# Patient Record
Sex: Female | Born: 1985
Health system: Southern US, Community
[De-identification: ages and names within clinical notes are randomized; demographics above are authoritative.]

## PROBLEM LIST (undated history)

## (undated) ENCOUNTER — Emergency Department (HOSPITAL_COMMUNITY): Payer: BC Managed Care – PPO

## (undated) DIAGNOSIS — F329 Major depressive disorder, single episode, unspecified: Secondary | ICD-10-CM

## (undated) DIAGNOSIS — F32A Depression, unspecified: Secondary | ICD-10-CM

## (undated) DIAGNOSIS — O24419 Gestational diabetes mellitus in pregnancy, unspecified control: Secondary | ICD-10-CM

## (undated) DIAGNOSIS — F419 Anxiety disorder, unspecified: Secondary | ICD-10-CM

## (undated) DIAGNOSIS — R3915 Urgency of urination: Secondary | ICD-10-CM

## (undated) DIAGNOSIS — K219 Gastro-esophageal reflux disease without esophagitis: Secondary | ICD-10-CM

## (undated) DIAGNOSIS — Z8619 Personal history of other infectious and parasitic diseases: Secondary | ICD-10-CM

## (undated) DIAGNOSIS — K449 Diaphragmatic hernia without obstruction or gangrene: Secondary | ICD-10-CM

## (undated) DIAGNOSIS — Z8719 Personal history of other diseases of the digestive system: Secondary | ICD-10-CM

## (undated) DIAGNOSIS — Z8659 Personal history of other mental and behavioral disorders: Secondary | ICD-10-CM

## (undated) DIAGNOSIS — N200 Calculus of kidney: Secondary | ICD-10-CM

## (undated) HISTORY — DX: Gastro-esophageal reflux disease without esophagitis: K21.9

## (undated) HISTORY — PX: TONSILLECTOMY: SUR1361

## (undated) HISTORY — PX: ESOPHAGOGASTRODUODENOSCOPY: SHX1529

## (undated) HISTORY — DX: Gestational diabetes mellitus in pregnancy, unspecified control: O24.419

## (undated) HISTORY — DX: Anxiety disorder, unspecified: F41.9

## (undated) SURGERY — CYSTOSCOPY/URETEROSCOPY/HOLMIUM LASER/STENT PLACEMENT
Anesthesia: General | Laterality: Left

---
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
## 2007-10-09 ENCOUNTER — Ambulatory Visit: Payer: Self-pay | Admitting: Gastroenterology

## 2007-10-09 LAB — CONVERTED CEMR LAB
Albumin: 4.2 g/dL (ref 3.5–5.2)
BUN: 8 mg/dL (ref 6–23)
Bilirubin, Direct: 0.2 mg/dL (ref 0.0–0.3)
Calcium: 9.6 mg/dL (ref 8.4–10.5)
Chloride: 104 meq/L (ref 96–112)
Eosinophils Absolute: 0.4 10*3/uL (ref 0.0–0.6)
GFR calc Af Amer: 120 mL/min
GFR calc non Af Amer: 99 mL/min
Glucose, Bld: 82 mg/dL (ref 70–99)
Hemoglobin: 13.9 g/dL (ref 12.0–15.0)
MCHC: 35 g/dL (ref 30.0–36.0)
MCV: 88.5 fL (ref 78.0–100.0)
Monocytes Absolute: 1.1 10*3/uL — ABNORMAL HIGH (ref 0.2–0.7)
Neutrophils Relative %: 77.9 % — ABNORMAL HIGH (ref 43.0–77.0)
Potassium: 4.2 meq/L (ref 3.5–5.1)
RDW: 12.6 % (ref 11.5–14.6)
Sodium: 140 meq/L (ref 135–145)
TSH: 1.64 microintl units/mL (ref 0.35–5.50)

## 2007-10-10 ENCOUNTER — Ambulatory Visit: Payer: Self-pay | Admitting: Gastroenterology

## 2007-10-10 DIAGNOSIS — K210 Gastro-esophageal reflux disease with esophagitis: Secondary | ICD-10-CM

## 2007-10-12 ENCOUNTER — Ambulatory Visit (HOSPITAL_COMMUNITY): Admission: RE | Admit: 2007-10-12 | Discharge: 2007-10-12 | Payer: Self-pay | Admitting: Gastroenterology

## 2007-11-09 ENCOUNTER — Ambulatory Visit: Payer: Self-pay | Admitting: Gastroenterology

## 2008-01-05 ENCOUNTER — Ambulatory Visit: Payer: Self-pay | Admitting: Gastroenterology

## 2008-01-20 ENCOUNTER — Emergency Department (HOSPITAL_COMMUNITY): Admission: EM | Admit: 2008-01-20 | Discharge: 2008-01-21 | Payer: Self-pay | Admitting: Emergency Medicine

## 2008-02-01 ENCOUNTER — Ambulatory Visit: Payer: Self-pay | Admitting: Gastroenterology

## 2008-09-11 ENCOUNTER — Telehealth: Payer: Self-pay | Admitting: Gastroenterology

## 2008-10-01 ENCOUNTER — Ambulatory Visit: Payer: Self-pay | Admitting: Gastroenterology

## 2008-10-01 DIAGNOSIS — R1013 Epigastric pain: Secondary | ICD-10-CM

## 2008-10-01 LAB — CONVERTED CEMR LAB: CRP, High Sensitivity: 1 (ref 0.00–5.00)

## 2008-10-02 ENCOUNTER — Ambulatory Visit (HOSPITAL_COMMUNITY): Admission: RE | Admit: 2008-10-02 | Discharge: 2008-10-02 | Payer: Self-pay | Admitting: Gastroenterology

## 2008-10-02 ENCOUNTER — Ambulatory Visit: Payer: Self-pay | Admitting: Gastroenterology

## 2008-10-02 DIAGNOSIS — N912 Amenorrhea, unspecified: Secondary | ICD-10-CM | POA: Insufficient documentation

## 2008-10-02 LAB — CONVERTED CEMR LAB: UREASE: NEGATIVE

## 2008-12-03 ENCOUNTER — Emergency Department (HOSPITAL_COMMUNITY): Admission: EM | Admit: 2008-12-03 | Discharge: 2008-12-03 | Payer: Self-pay | Admitting: Emergency Medicine

## 2009-01-18 ENCOUNTER — Emergency Department (HOSPITAL_COMMUNITY): Admission: EM | Admit: 2009-01-18 | Discharge: 2009-01-18 | Payer: Self-pay | Admitting: Emergency Medicine

## 2009-01-27 ENCOUNTER — Telehealth: Payer: Self-pay | Admitting: Gastroenterology

## 2009-01-29 ENCOUNTER — Encounter: Payer: Self-pay | Admitting: Gastroenterology

## 2009-02-20 ENCOUNTER — Emergency Department (HOSPITAL_COMMUNITY): Admission: EM | Admit: 2009-02-20 | Discharge: 2009-02-20 | Payer: Self-pay | Admitting: Emergency Medicine

## 2009-03-09 ENCOUNTER — Emergency Department (HOSPITAL_COMMUNITY): Admission: EM | Admit: 2009-03-09 | Discharge: 2009-03-10 | Payer: Self-pay | Admitting: Emergency Medicine

## 2009-03-10 ENCOUNTER — Telehealth: Payer: Self-pay | Admitting: Gastroenterology

## 2009-03-12 ENCOUNTER — Ambulatory Visit: Payer: Self-pay | Admitting: Gastroenterology

## 2009-03-12 DIAGNOSIS — K269 Duodenal ulcer, unspecified as acute or chronic, without hemorrhage or perforation: Secondary | ICD-10-CM | POA: Insufficient documentation

## 2009-03-12 DIAGNOSIS — R110 Nausea: Secondary | ICD-10-CM | POA: Insufficient documentation

## 2009-03-12 LAB — CONVERTED CEMR LAB
Basophils Absolute: 0 10*3/uL (ref 0.0–0.1)
Eosinophils Absolute: 0.2 10*3/uL (ref 0.0–0.7)
Eosinophils Relative: 2.6 % (ref 0.0–5.0)
HCT: 40 % (ref 36.0–46.0)
Lymphs Abs: 3.3 10*3/uL (ref 0.7–4.0)
Monocytes Absolute: 0.8 10*3/uL (ref 0.1–1.0)
Monocytes Relative: 8.6 % (ref 3.0–12.0)
Neutro Abs: 5.3 10*3/uL (ref 1.4–7.7)
RDW: 15.6 % — ABNORMAL HIGH (ref 11.5–14.6)

## 2009-03-13 ENCOUNTER — Telehealth: Payer: Self-pay | Admitting: Physician Assistant

## 2009-03-13 ENCOUNTER — Encounter: Payer: Self-pay | Admitting: Physician Assistant

## 2009-03-25 ENCOUNTER — Ambulatory Visit: Payer: Self-pay | Admitting: Gastroenterology

## 2009-03-26 ENCOUNTER — Ambulatory Visit: Payer: Self-pay | Admitting: Gastroenterology

## 2009-03-27 ENCOUNTER — Telehealth: Payer: Self-pay | Admitting: Gastroenterology

## 2009-04-03 ENCOUNTER — Inpatient Hospital Stay (HOSPITAL_COMMUNITY): Admission: EM | Admit: 2009-04-03 | Discharge: 2009-04-06 | Payer: Self-pay | Admitting: Emergency Medicine

## 2009-04-03 ENCOUNTER — Telehealth: Payer: Self-pay | Admitting: Gastroenterology

## 2009-04-04 ENCOUNTER — Telehealth: Payer: Self-pay | Admitting: Gastroenterology

## 2009-04-29 ENCOUNTER — Telehealth: Payer: Self-pay | Admitting: Gastroenterology

## 2009-04-30 ENCOUNTER — Emergency Department (HOSPITAL_COMMUNITY): Admission: EM | Admit: 2009-04-30 | Discharge: 2009-05-01 | Payer: Self-pay | Admitting: Emergency Medicine

## 2009-05-07 ENCOUNTER — Ambulatory Visit: Payer: Self-pay | Admitting: Internal Medicine

## 2009-05-07 ENCOUNTER — Encounter (INDEPENDENT_AMBULATORY_CARE_PROVIDER_SITE_OTHER): Payer: Self-pay | Admitting: Internal Medicine

## 2009-05-07 DIAGNOSIS — W57XXXA Bitten or stung by nonvenomous insect and other nonvenomous arthropods, initial encounter: Secondary | ICD-10-CM

## 2009-05-07 DIAGNOSIS — T148 Other injury of unspecified body region: Secondary | ICD-10-CM

## 2009-05-07 LAB — CONVERTED CEMR LAB
Basophils Absolute: 0 10*3/uL (ref 0.0–0.1)
Basophils Relative: 0 % (ref 0–1)
Eosinophils Relative: 1 % (ref 0–5)
HCT: 39.3 % (ref 36.0–46.0)
Hemoglobin: 12.9 g/dL (ref 12.0–15.0)
Lymphocytes Relative: 23 % (ref 12–46)
MCHC: 32.8 g/dL (ref 30.0–36.0)
Monocytes Absolute: 0.8 10*3/uL (ref 0.1–1.0)
Monocytes Relative: 8 % (ref 3–12)
RDW: 13.4 % (ref 11.5–15.5)

## 2009-05-08 ENCOUNTER — Encounter (INDEPENDENT_AMBULATORY_CARE_PROVIDER_SITE_OTHER): Payer: Self-pay | Admitting: *Deleted

## 2009-05-08 DIAGNOSIS — D509 Iron deficiency anemia, unspecified: Secondary | ICD-10-CM

## 2009-05-14 ENCOUNTER — Emergency Department (HOSPITAL_COMMUNITY): Admission: EM | Admit: 2009-05-14 | Discharge: 2009-05-14 | Payer: Self-pay | Admitting: Emergency Medicine

## 2009-06-25 ENCOUNTER — Emergency Department (HOSPITAL_COMMUNITY): Admission: EM | Admit: 2009-06-25 | Discharge: 2009-06-26 | Payer: Self-pay | Admitting: Emergency Medicine

## 2009-07-01 ENCOUNTER — Emergency Department (HOSPITAL_COMMUNITY): Admission: EM | Admit: 2009-07-01 | Discharge: 2009-07-01 | Payer: Self-pay | Admitting: Emergency Medicine

## 2009-07-08 ENCOUNTER — Emergency Department (HOSPITAL_COMMUNITY): Admission: EM | Admit: 2009-07-08 | Discharge: 2009-07-08 | Payer: Self-pay | Admitting: Emergency Medicine

## 2009-07-21 ENCOUNTER — Ambulatory Visit: Payer: Self-pay | Admitting: Internal Medicine

## 2009-07-22 ENCOUNTER — Observation Stay (HOSPITAL_COMMUNITY): Admission: EM | Admit: 2009-07-22 | Discharge: 2009-07-26 | Payer: Self-pay | Admitting: Emergency Medicine

## 2009-07-26 ENCOUNTER — Ambulatory Visit: Payer: Self-pay | Admitting: Psychiatry

## 2009-07-30 ENCOUNTER — Telehealth: Payer: Self-pay | Admitting: Gastroenterology

## 2009-08-03 ENCOUNTER — Emergency Department (HOSPITAL_COMMUNITY): Admission: EM | Admit: 2009-08-03 | Discharge: 2009-08-04 | Payer: Self-pay | Admitting: Emergency Medicine

## 2009-08-05 ENCOUNTER — Telehealth: Payer: Self-pay | Admitting: Gastroenterology

## 2009-08-21 ENCOUNTER — Ambulatory Visit: Payer: Self-pay | Admitting: Gastroenterology

## 2009-08-21 DIAGNOSIS — F323 Major depressive disorder, single episode, severe with psychotic features: Secondary | ICD-10-CM

## 2009-08-29 ENCOUNTER — Emergency Department (HOSPITAL_COMMUNITY): Admission: EM | Admit: 2009-08-29 | Discharge: 2009-08-29 | Payer: Self-pay | Admitting: Family Medicine

## 2009-10-25 ENCOUNTER — Emergency Department (HOSPITAL_COMMUNITY): Admission: EM | Admit: 2009-10-25 | Discharge: 2009-10-25 | Payer: Self-pay | Admitting: Emergency Medicine

## 2009-11-04 ENCOUNTER — Ambulatory Visit: Payer: Self-pay | Admitting: Gastroenterology

## 2009-11-04 LAB — CONVERTED CEMR LAB
Basophils Relative: 0 % (ref 0.0–3.0)
CO2: 29 meq/L (ref 19–32)
Calcium: 9.4 mg/dL (ref 8.4–10.5)
Chloride: 104 meq/L (ref 96–112)
Creatinine, Ser: 0.6 mg/dL (ref 0.4–1.2)
Eosinophils Relative: 1 % (ref 0.0–5.0)
HCT: 38.6 % (ref 36.0–46.0)
Hemoglobin: 13.3 g/dL (ref 12.0–15.0)
IgA: 172 mg/dL (ref 68–378)
Lymphs Abs: 2.3 10*3/uL (ref 0.7–4.0)
MCV: 91.7 fL (ref 78.0–100.0)
Monocytes Absolute: 0.5 10*3/uL (ref 0.1–1.0)
Monocytes Relative: 7.9 % (ref 3.0–12.0)
Neutro Abs: 3.2 10*3/uL (ref 1.4–7.7)
Platelets: 283 10*3/uL (ref 150.0–400.0)
Sodium: 140 meq/L (ref 135–145)
WBC: 6.1 10*3/uL (ref 4.5–10.5)

## 2009-11-13 ENCOUNTER — Ambulatory Visit (HOSPITAL_COMMUNITY): Admission: RE | Admit: 2009-11-13 | Discharge: 2009-11-13 | Payer: Self-pay | Admitting: Gastroenterology

## 2009-11-17 ENCOUNTER — Telehealth: Payer: Self-pay | Admitting: Gastroenterology

## 2010-01-04 ENCOUNTER — Emergency Department (HOSPITAL_COMMUNITY): Admission: EM | Admit: 2010-01-04 | Discharge: 2010-01-04 | Payer: Self-pay | Admitting: Family Medicine

## 2010-01-15 ENCOUNTER — Emergency Department (HOSPITAL_COMMUNITY): Admission: EM | Admit: 2010-01-15 | Discharge: 2010-01-15 | Payer: Self-pay | Admitting: Emergency Medicine

## 2010-01-29 ENCOUNTER — Encounter: Payer: Self-pay | Admitting: Internal Medicine

## 2010-01-29 ENCOUNTER — Ambulatory Visit: Payer: Self-pay | Admitting: Internal Medicine

## 2010-01-29 ENCOUNTER — Encounter: Payer: Self-pay | Admitting: Gastroenterology

## 2010-02-09 ENCOUNTER — Emergency Department (HOSPITAL_COMMUNITY): Admission: EM | Admit: 2010-02-09 | Discharge: 2010-02-09 | Payer: Self-pay | Admitting: Emergency Medicine

## 2010-03-19 HISTORY — PX: KNEE ARTHROSCOPY: SUR90

## 2010-03-22 ENCOUNTER — Inpatient Hospital Stay (HOSPITAL_COMMUNITY): Admission: EM | Admit: 2010-03-22 | Discharge: 2010-03-24 | Payer: Self-pay | Admitting: Emergency Medicine

## 2010-03-23 ENCOUNTER — Encounter (INDEPENDENT_AMBULATORY_CARE_PROVIDER_SITE_OTHER): Payer: Self-pay | Admitting: Family Medicine

## 2010-03-23 ENCOUNTER — Ambulatory Visit: Payer: Self-pay | Admitting: Vascular Surgery

## 2010-04-21 ENCOUNTER — Encounter: Admission: RE | Admit: 2010-04-21 | Discharge: 2010-06-15 | Payer: Self-pay | Admitting: Specialist

## 2010-07-16 ENCOUNTER — Emergency Department (HOSPITAL_COMMUNITY): Admission: EM | Admit: 2010-07-16 | Discharge: 2010-07-16 | Payer: Self-pay | Admitting: Family Medicine

## 2010-07-28 ENCOUNTER — Emergency Department (HOSPITAL_COMMUNITY): Admission: EM | Admit: 2010-07-28 | Discharge: 2010-07-28 | Payer: Self-pay | Admitting: Family Medicine

## 2010-09-14 ENCOUNTER — Emergency Department (HOSPITAL_COMMUNITY): Admission: EM | Admit: 2010-09-14 | Discharge: 2010-09-14 | Payer: Self-pay | Admitting: Family Medicine

## 2010-09-14 ENCOUNTER — Telehealth (INDEPENDENT_AMBULATORY_CARE_PROVIDER_SITE_OTHER): Payer: Self-pay | Admitting: *Deleted

## 2010-11-29 ENCOUNTER — Emergency Department (HOSPITAL_COMMUNITY)
Admission: EM | Admit: 2010-11-29 | Discharge: 2010-11-30 | Payer: Self-pay | Source: Home / Self Care | Admitting: Emergency Medicine

## 2010-11-29 NOTE — L&D Delivery Note (Signed)
Delivery Note She progressed to complete and pushed well.  At 4:38 AM a viable and healthy female was delivered via Vaginal, Spontaneous Delivery (Presentation: ;  ).  APGAR:8 ,9 ; weight 9 lb 6.8 oz (4275 g).   Placenta status:spontaneous, intact.    Anesthesia: Epidural  Episiotomy: Small median due to restrictive band holding up delivery Lacerations: small right labial Suture Repair: 3.0 vicryl Est. Blood Loss (mL): 600  Mom to postpartum.  Baby to nursery-stable.  Krystal Humphrey D 09/24/2011, 5:06 AM

## 2010-12-21 ENCOUNTER — Encounter: Payer: Self-pay | Admitting: Gastroenterology

## 2010-12-29 NOTE — Progress Notes (Signed)
Summary: Atterol  Phone Note Call from Patient   Caller: Patient Summary of Call: Call from pt said that she was given an appointment today to follow up on her medication.  Pt said that she was to start back on her Atterol.  Has ADHD.  Was prescribed several years ago by her former doctor in [place].  York Spaniel that she will be starting school again on Thursday and wants to get back on the Atterol.  Pt said that she thinks she has an ear infection.  Said that her ears are stopped up.  Problems hearing.  Had fever 2-3 days ago.  No chills.  Said that she put Sweet Oil in her ears and cotton.  Cotton had a little blood on it.  Coughing up yellow green mucous.  Said taht she feels bad.  Pt said  when asked about documentation about the Atterol.  Pt said  that  she will get documentation today about the Atterol and the dosing .  Wants to be seen today.  Pt was advised that she will need to be seen at some point before the Atterol would be prescribed as well as some type of documentation would be needed.  Pt agreed and will get the information. Initial call taken by: Angelina Ok RN,  September 14, 2010 9:29 AM  Follow-up for Phone Call        Per Inocencio Homes, we have no appts today but you should double check that.  Her ear we can take care of.  As for her ADHD and her Adderall, I personally don't rx these as I don't have enough experience dosing and monitoring the response. But I am not her primary. Whoever sees her may simply arrange referral to psych to Rx this med. Follow-up by: Blanch Media MD,  September 14, 2010 9:46 AM  Additional Follow-up for Phone Call Additional follow up Details #1::        Pt is going to go to Urgent Care for her URI today.  Will call later to be scheduled with an appointment for the Atterol. Additional Follow-up by: Angelina Ok RN,  September 14, 2010 10:00 AM

## 2010-12-29 NOTE — Letter (Signed)
Summary: *Referral Letter [facility])  [facility]  [address]   [place], [place] [address]   Phone: [phone]  Fax: [phone]    01/29/2010  Thank you in advance for taking acre of this patient:  Krystal Humphrey [address] [place], [place]  [address]  Phone: [phone]  Reason for Referral: Chronic abdominal pain in the setting of iron deficiency (subclinical)  Procedures Requested: Small bowel biopsy to rule out celiac disease  Current Medical Problems: 1)  DEPRESSIVE TYPE PSYCHOSIS (ICD-298.0) 2)  ANEMIA, IRON DEFICIENCY (ICD-280.9) 3)  INSECT BITE (ICD-919.4) 4)  NAUSEA (ICD-787.02) 5)  ULCER-DUODENAL (ICD-532.9) 6)  NAUSEA (ICD-787.02) 7)  ABSENCE OF MENSTRUATION (ICD-626.0) 8)  EPIGASTRIC PAIN (ICD-789.06) 9)  REFLUX ESOPHAGITIS (ICD-530.11)   Current Medications: 1)  NEXIUM 40 MG CPDR (ESOMEPRAZOLE MAGNESIUM) 1 by mouth once daily 2)  REGLAN 5 MG TABS (METOCLOPRAMIDE HCL) 1 before meals and at bedtime 3)  SERTRALINE HCL 50 MG TABS (SERTRALINE HCL) once daily at bedtime   Past Medical History: 1)  Current Problems:  2)  GASTRITIS (ICD-535.50) 3)  REFLUX ESOPHAGITIS (ICD-530.11) 4)  HIATAL HERNIA (ICD-553.3)   Family History: 1)  No FH of Colon Cancer:  2)  Family History of Prostate Cancer: father 3)  Melanoma :  Uncle 4)  Family History of Colitis/Crohn's: aunt grandmother 41)  Family History of Diabetes: grandfather uncle  44)  Family History of Heart Disease: grandfather    Social History: 1)  Occupation: [employer]  2)  Daily Caffeine Use 1 per day 3)  Illicit Drug Use - no 4)  Patient does not get regular exercise.  5)  Alcohol Use - yes -rarely   Pertinent Labs:    Please send (mail / fax) all correspondence pertaining to this referral to:  ATTN:    _________________________________   [facility]   [address]   [place], [place] [address]    Fax: [phone]  Thank you  again for agreeing to see our patient; please contact us if you have any further questions or need additional information.  Sincerely,  Lars Mage MD

## 2010-12-29 NOTE — Assessment & Plan Note (Signed)
Summary: ACUTE-STOMACH PAIN/COUGHING UP A LITTLE BLOOD/CFB([facility])   Vital Signs:  Patient profile:   26 year old female Height:      68 inches (172.72 cm) Weight:      163.6 pounds (74.36 kg) BMI:     24.97 Temp:     98.2 degrees F (36.78 degrees C) oral Pulse rate:   73 / minute BP sitting:   115 / 58  (right arm)  Vitals Entered By: Krystal Eaton Duncan Dull) (January 29, 2010 2:40 PM) CC: pt c/o recurrent blood in vomit off and on x 43yr, abd pain (sharp), sometimes feel sick and nauseated after meals Is Patient Diabetic? No Pain Assessment Patient in pain? yes     Location: abdomen Intensity: 8 Type: sharp Onset of pain  Intermittent recurrent x 1 yr Nutritional Status BMI of 25 - 29 = overweight  Have you ever been in a relationship where you felt threatened, hurt or afraid?No   Does patient need assistance? Functional Status Self care Ambulation Normal   Primary Care Provider:  Melida Quitter MD  CC:  pt c/o recurrent blood in vomit off and on x 70yr, abd pain (sharp), and sometimes feel sick and nauseated after meals.  History of Present Illness: Patient is 25 yo women with PMH as decribed in EMR comes in today with abdominal pain. She says that abdmonial pain one of her usual abdominal pains, it starts on it self without any aggravating factors is 8/10 at its worse there are no agg or relieving factors, sharp, stabbing type pain in epigastric region with no radiation. Not associated with eating. She has had multiple evaluations in the past but no diagnosis has been made. She does not take her meds as Reglan causes psychosis and she is sick of nexium. Stopped taking trazodone as she was awake all night after trazodone and says that she is still deprerssed and is not sleeping well all night.  Depression History:      The patient is having a depressed mood most of the day and has a diminished interest in her usual daily activities.         Preventive Screening-Counseling &  Management  Alcohol-Tobacco     Alcohol drinks/day: 1     Alcohol type: beer     Smoking Status: QUIT   2010     Packs/Day: 1/2   Problems Prior to Update: 1)  Depressive Type Psychosis  (ICD-298.0) 2)  Anemia, Iron Deficiency  (ICD-280.9) 3)  Insect Bite  (ICD-919.4) 4)  Nausea  (ICD-787.02) 5)  Ulcer-duodenal  (ICD-532.9) 6)  Nausea  (ICD-787.02) 7)  Absence of Menstruation  (ICD-626.0) 8)  Epigastric Pain  (ICD-789.06) 9)  Reflux Esophagitis  (ICD-530.11)  Medications Prior to Update: 1)  Nexium 40 Mg Cpdr (Esomeprazole Magnesium) .Marland Kitchen.. 1 By Mouth Once Daily 2)  Reglan 5 Mg Tabs (Metoclopramide Hcl) .Marland Kitchen.. 1 Before Meals and At Bedtime  Current Medications (verified): 1)  Nexium 40 Mg Cpdr (Esomeprazole Magnesium) .Marland Kitchen.. 1 By Mouth Once Daily 2)  Reglan 5 Mg Tabs (Metoclopramide Hcl) .Marland Kitchen.. 1 Before Meals and At Bedtime 3)  Sertraline Hcl 50 Mg Tabs (Sertraline Hcl) .... Once Daily At Bedtime  Allergies: 1)  ! Flagyl (Metronidazole)  Past History:  Past Medical History: Last updated: 11/24/2007 Current Problems:  GASTRITIS (ICD-535.50) REFLUX ESOPHAGITIS (ICD-530.11) HIATAL HERNIA (ICD-553.3)  Past Surgical History: Last updated: 10/01/2008 Tonsillectomy  Family History: Last updated: 10/01/2008 No FH of Colon Cancer:  Family History of Prostate Cancer:  father Melanoma :  Uncle Family History of Colitis/Crohn's: aunt grandmother Family History of Diabetes: grandfather uncle  Family History of Heart Disease: grandfather  Social History: Last updated: 11/04/2009 Occupation: [employer]  Daily Caffeine Use 1 per day Illicit Drug Use - no Patient does not get regular exercise.  Alcohol Use - yes -rarely  Risk Factors: Alcohol Use: 1 (01/29/2010) Caffeine Use: 1 (10/01/2008) Exercise: no (10/01/2008)  Risk Factors: Smoking Status: QUIT   2010 (01/29/2010) Packs/Day: 1/2  (01/29/2010)  Family History: Reviewed history from 10/01/2008 and no changes  required. No FH of Colon Cancer:  Family History of Prostate Cancer: father Melanoma :  Uncle Family History of Colitis/Crohn's: aunt grandmother Family History of Diabetes: grandfather uncle  Family History of Heart Disease: grandfather  Social History: Reviewed history from 11/04/2009 and no changes required. Occupation: [employer]  Daily Caffeine Use 1 per day Illicit Drug Use - no Patient does not get regular exercise.  Alcohol Use - yes -rarely  Review of Systems      See HPI  Physical Exam  Additional Exam:  Gen: AOx3, in no acute distress Eyes: PERRL, EOMI ENT:MMM, No erythema noted in posterior pharynx Neck: No JVD, No LAP Chest: CTAB with  good respiratory effort CVS: regular rhythmic rate, NO M/R/G, S1 S2 normal Abdo: soft,ND, BS+x4, Non tender and No hepatosplenomegaly EXT: No odema noted Neuro: Non focal, gait is normal Skin: no rashes noted.    Impression & Recommendations:  Problem # 1:  DEPRESSIVE TYPE PSYCHOSIS (ICD-298.0) Assessment Unchanged Start her on Zoloft 50mg  once daily and have her see a Psychiatrist in 2 weeks for evaluation and management. Orders: Psychiatric Referral (Psych)  Problem # 2:  ANEMIA, IRON DEFICIENCY (ICD-280.9) Assessment: Unchanged Patient has had RDW in 15 range, though she is not anemic at this time but her ferritin was 9 during one of her previous lab results. We will check Ferritin levels, retic count and TIBC to make sure that she does not need iron therapy for replenishment if her iron stores. Patient has iron def anemia in the setting of abdominal pain and we do not see any small bowel biopsy that has been done to rule out Celiac disease. Orders: T-Anemia Panel 1 (2906) T-Iron [phone]) T-Ferritin [id number]) T-Iron Binding Capacity (TIBC) ([id number]) T-Reticulocyte Count, Automated ([id number])  Hgb: 13.3 (11/04/2009)   Hct: 38.6 (11/04/2009)   Platelets: 283.0 (11/04/2009) RBC: 4.21 (11/04/2009)   RDW:  13.9 (11/04/2009)   WBC: 6.1 (11/04/2009) MCV: 91.7 (11/04/2009)   MCHC: 34.4 (11/04/2009) Ferritin: 9 (05/07/2009) TSH: 1.64 (10/09/2007)  Problem # 3:  NAUSEA (ICD-787.02) Assessment: Unchanged Discussed symptom control and asked her to continue taking reglan for nausea as it helps by incresed gastric motility.  Problem # 4:  EPIGASTRIC PAIN (ICD-789.06) Assessment: Unchanged  No particular treatment was discussed at this time as this is something which has been going on for last few years.  Her updated medication list for this problem includes:    Reglan 5 Mg Tabs (Metoclopramide hcl) .Marland Kitchen... 1 before meals and at bedtime  Her updated medication list for this problem includes:    Reglan 5 Mg Tabs (Metoclopramide hcl) .Marland Kitchen... 1 before meals and at bedtime  Complete Medication List: 1)  Nexium 40 Mg Cpdr (Esomeprazole magnesium) .Marland Kitchen.. 1 by mouth once daily 2)  Reglan 5 Mg Tabs (Metoclopramide hcl) .Marland Kitchen.. 1 before meals and at bedtime 3)  Sertraline Hcl 50 Mg Tabs (Sertraline hcl) .... Once daily at bedtime  Patient Instructions:  1)  Please schedule a follow-up appointment in 3 months. 2)  Please schedule a follow-up appointment as needed. 3)  You have an appointment with Psychiatrist. 4)  It is important that you exercise regularly at least 20 minutes 5 times a week. If you develop chest pain, have severe difficulty breathing, or feel very tired , stop exercising immediately and seek medical attention. 5)  You need to lose weight. Consider a lower calorie diet and regular exercise.  6)  You need to have a Pap Smear to prevent cervical cancer. Prescriptions: SERTRALINE HCL 50 MG TABS (SERTRALINE HCL) once daily at bedtime  #30 x 1   Entered by:   Krystal Eaton (AAMA)   Authorized by:   Lars Mage MD   Signed by:   Lars Mage MD on 01/30/2010   Method used:   Electronically to        [id number]* (retail)       [address] [place], [place]  [address]        Ph: [phone]       Fax: [phone]   RxID:   [id number] SERTRALINE HCL 50 MG TABS (SERTRALINE HCL) once daily at bedtime  #30 x 1   Entered and Authorized by:   Lars Mage MD   Signed by:   Lars Mage MD on 01/29/2010   Method used:   Electronically to        [facility] [address] [phone]* (retail)       [address] [place], [place]  [address]       Ph: [phone]       Fax: [phone]   RxID:   [id number]  Process Orders Check Orders Results:     [facility]: Order checked:      -- T-Anemia Panel 1 --  NO TEST CODE (CPT: ) Tests Sent for requisitioning (January 30, 2010 12:40 AM):     01/29/2010: [facility] -- T-Anemia Panel 1 [2906] (signed)     01/29/2010: [facility] -- Augusto Gamble [[id number]] (signed)     01/29/2010: [facility] -- T-Ferritin [[id number]] (signed)     01/29/2010: [facility] -- T-Iron Binding Capacity (TIBC) [[id number]] (signed)     01/29/2010: [facility] -- T-Reticulocyte Count, Automated [[id number]] (signed)  Pt no longer uses the the [facility] on [address], rx will be sent to the [facility] on [address].Krystal Eaton [facility])  January 29, 2010 3:46 PM   Prevention & Chronic Care Immunizations   Influenza vaccine: Not documented   Influenza vaccine deferral: Refused  (01/29/2010)    Tetanus booster: Not documented   Td booster deferral: Deferred  (01/29/2010)    Pneumococcal vaccine: Not documented  Other Screening   Pap smear: Not documented   Smoking status: QUIT   2010  (01/29/2010)      Resource handout printed.

## 2010-12-29 NOTE — Progress Notes (Signed)
Summary: triage   Phone Note Call from Patient Call back at Home Phone [phone]   Caller: Patient Call For: Krystal Humphrey Reason for Call: Talk to Nurse Summary of Call: Patient would like refills on her aciphex but wants to know if she needs to come in and see Dr Jarold Motto since she is having stomach pains again  Initial call taken by: Tawni Levy,  January 27, 2009 2:13 PM  Follow-up for Phone Call        number unavailable  Harlow Mares CMA,  January 27, 2009 2:22 PM number unavailable  Harlow Mares CMA  January 28, 2009 8:39 AM   spoke to mother , rx sent Follow-up by: Harlow Mares CMA,  January 28, 2009 12:53 PM      Prescriptions: ACIPHEX 20 MG  TBEC (RABEPRAZOLE SODIUM) Take 1 each day 30 minutes before meals  #30 x 7   Entered by:   Harlow Mares CMA   Authorized by:   Mardella Layman MD FACG,FAGA   Signed by:   Harlow Mares CMA on 01/28/2009   Method used:   Electronically to        [facility]* (retail)       [address] [place] [address] [place], [place]  [address]       Ph: [phone] or [phone]       Fax: [phone]   RxID:   [id number]

## 2010-12-29 NOTE — Letter (Signed)
Summary: Referral/Outpatient Clinic  Referral/Outpatient Clinic   Imported By: Lester Bronson 02/05/2010 07:52:21  _____________________________________________________________________  External Attachment:    Type:   Image     Comment:   External Document

## 2011-02-08 LAB — DIFFERENTIAL
Basophils Relative: 0 % (ref 0–1)
Eosinophils Relative: 1 % (ref 0–5)
Lymphocytes Relative: 29 % (ref 12–46)
Monocytes Absolute: 0.8 10*3/uL (ref 0.1–1.0)
Monocytes Relative: 7 % (ref 3–12)
Neutro Abs: 7.1 10*3/uL (ref 1.7–7.7)

## 2011-02-08 LAB — URINALYSIS, ROUTINE W REFLEX MICROSCOPIC
Glucose, UA: NEGATIVE mg/dL
Hgb urine dipstick: NEGATIVE
Specific Gravity, Urine: 1.01 (ref 1.005–1.030)
pH: 7 (ref 5.0–8.0)

## 2011-02-08 LAB — URINE MICROSCOPIC-ADD ON

## 2011-02-08 LAB — COMPREHENSIVE METABOLIC PANEL
AST: 19 U/L (ref 0–37)
Albumin: 4.1 g/dL (ref 3.5–5.2)
Alkaline Phosphatase: 40 U/L (ref 39–117)
BUN: 9 mg/dL (ref 6–23)
Chloride: 106 mEq/L (ref 96–112)
GFR calc Af Amer: >60 mL/min (ref >60)
Potassium: 3.6 mEq/L (ref 3.5–5.1)
Total Bilirubin: 0.5 mg/dL (ref 0.3–1.2)
Total Protein: 7 g/dL (ref 6.0–8.3)

## 2011-02-08 LAB — CBC
MCV: 90.1 fL (ref 78.0–100.0)
Platelets: 248 10*3/uL (ref 150–400)
RBC: 4.53 MIL/uL (ref 3.87–5.11)
RDW: 12.6 % (ref 11.5–15.5)
WBC: 11.4 10*3/uL — ABNORMAL HIGH (ref 4.0–10.5)

## 2011-02-08 LAB — POCT PREGNANCY, URINE: Preg Test, Ur: NEGATIVE

## 2011-02-12 LAB — WET PREP, GENITAL
Trich, Wet Prep: NONE SEEN
Yeast Wet Prep HPF POC: NONE SEEN

## 2011-02-12 LAB — GC/CHLAMYDIA PROBE AMP, GENITAL
Chlamydia, DNA Probe: NEGATIVE
GC Probe Amp, Genital: NEGATIVE

## 2011-02-16 LAB — CBC
HCT: 29.8 % — ABNORMAL LOW (ref 36.0–46.0)
HCT: 31.5 % — ABNORMAL LOW (ref 36.0–46.0)
HCT: 32.2 % — ABNORMAL LOW (ref 36.0–46.0)
Hemoglobin: 10.3 g/dL — ABNORMAL LOW (ref 12.0–15.0)
Hemoglobin: 10.9 g/dL — ABNORMAL LOW (ref 12.0–15.0)
Hemoglobin: 11.4 g/dL — ABNORMAL LOW (ref 12.0–15.0)
MCHC: 34.5 g/dL (ref 30.0–36.0)
MCHC: 35.2 g/dL (ref 30.0–36.0)
MCV: 90.3 fL (ref 78.0–100.0)
MCV: 91.5 fL (ref 78.0–100.0)
RBC: 3.23 MIL/uL — ABNORMAL LOW (ref 3.87–5.11)
RBC: 3.56 MIL/uL — ABNORMAL LOW (ref 3.87–5.11)
RDW: 12.7 % (ref 11.5–15.5)
RDW: 13.1 % (ref 11.5–15.5)
WBC: 10 10*3/uL (ref 4.0–10.5)
WBC: 13.9 10*3/uL — ABNORMAL HIGH (ref 4.0–10.5)

## 2011-02-16 LAB — URINALYSIS, ROUTINE W REFLEX MICROSCOPIC
Glucose, UA: NEGATIVE mg/dL
Hgb urine dipstick: NEGATIVE
Ketones, ur: 15 mg/dL — AB
pH: 8.5 — ABNORMAL HIGH (ref 5.0–8.0)

## 2011-02-16 LAB — COMPREHENSIVE METABOLIC PANEL
ALT: 17 U/L (ref 0–35)
AST: 21 U/L (ref 0–37)
Albumin: 2.8 g/dL — ABNORMAL LOW (ref 3.5–5.2)
Albumin: 4.1 g/dL (ref 3.5–5.2)
Alkaline Phosphatase: 44 U/L (ref 39–117)
BUN: 9 mg/dL (ref 6–23)
Calcium: 9 mg/dL (ref 8.4–10.5)
Creatinine, Ser: 0.87 mg/dL (ref 0.4–1.2)
GFR calc Af Amer: >60 mL/min (ref >60)
Glucose, Bld: 72 mg/dL (ref 70–99)
Potassium: 3.3 mEq/L — ABNORMAL LOW (ref 3.5–5.1)
Potassium: 3.8 mEq/L (ref 3.5–5.1)
Sodium: 135 mEq/L (ref 135–145)
Total Bilirubin: 0.6 mg/dL (ref 0.3–1.2)
Total Protein: 5.5 g/dL — ABNORMAL LOW (ref 6.0–8.3)
Total Protein: 7.4 g/dL (ref 6.0–8.3)

## 2011-02-16 LAB — STOOL CULTURE

## 2011-02-16 LAB — DIFFERENTIAL
Basophils Absolute: 0 10*3/uL (ref 0.0–0.1)
Basophils Absolute: 0 10*3/uL (ref 0.0–0.1)
Basophils Relative: 0 % (ref 0–1)
Basophils Relative: 0 % (ref 0–1)
Eosinophils Absolute: 0 10*3/uL (ref 0.0–0.7)
Eosinophils Absolute: 0.2 10*3/uL (ref 0.0–0.7)
Eosinophils Relative: 0 % (ref 0–5)
Eosinophils Relative: 3 % (ref 0–5)
Lymphocytes Relative: 19 % (ref 12–46)
Lymphocytes Relative: 29 % (ref 12–46)
Lymphs Abs: 0.5 10*3/uL — ABNORMAL LOW (ref 0.7–4.0)
Monocytes Absolute: 0.6 10*3/uL (ref 0.1–1.0)
Monocytes Absolute: 0.7 10*3/uL (ref 0.1–1.0)
Monocytes Absolute: 1.1 10*3/uL — ABNORMAL HIGH (ref 0.1–1.0)
Monocytes Relative: 5 % (ref 3–12)
Monocytes Relative: 6 % (ref 3–12)
Neutro Abs: 10.2 10*3/uL — ABNORMAL HIGH (ref 1.7–7.7)
Neutrophils Relative %: 73 % (ref 43–77)

## 2011-02-16 LAB — CULTURE, BLOOD (ROUTINE X 2)

## 2011-02-16 LAB — DRUGS OF ABUSE SCREEN W/O ALC, ROUTINE URINE
Amphetamine Screen, Ur: NEGATIVE
Creatinine,U: 46.5 mg/dL
Marijuana Metabolite: NEGATIVE
Propoxyphene: NEGATIVE

## 2011-02-16 LAB — OPIATE, QUANTITATIVE, URINE
Oxycodone, ur: NEGATIVE NG/ML
Oxymorphone: NEGATIVE NG/ML

## 2011-02-16 LAB — URINE MICROSCOPIC-ADD ON

## 2011-02-16 LAB — CLOSTRIDIUM DIFFICILE EIA: C difficile Toxins A+B, EIA: NEGATIVE

## 2011-02-16 LAB — URINE CULTURE
Colony Count: NO GROWTH
Culture: NO GROWTH

## 2011-02-16 LAB — WET PREP, GENITAL: Trich, Wet Prep: NONE SEEN

## 2011-02-16 LAB — GC/CHLAMYDIA PROBE AMP, GENITAL: Chlamydia, DNA Probe: NEGATIVE

## 2011-02-17 LAB — POCT CARDIAC MARKERS
CKMB, poc: <1 ng/mL — ABNORMAL LOW (ref 1.0–8.0)
Myoglobin, poc: 69.4 ng/mL (ref 12–200)
Troponin i, poc: <0.05 ng/mL (ref 0.00–0.09)

## 2011-02-19 LAB — HIV ANTIBODY (ROUTINE TESTING W REFLEX): HIV: NONREACTIVE

## 2011-02-19 LAB — ABO/RH: RH Type: POSITIVE

## 2011-02-19 LAB — GC/CHLAMYDIA PROBE AMP, GENITAL
Chlamydia: NEGATIVE
Gonorrhea: NEGATIVE

## 2011-02-19 LAB — ANTIBODY SCREEN: Antibody Screen: NEGATIVE

## 2011-03-03 LAB — URINE MICROSCOPIC-ADD ON

## 2011-03-03 LAB — COMPREHENSIVE METABOLIC PANEL
ALT: 14 U/L (ref 0–35)
Alkaline Phosphatase: 34 U/L — ABNORMAL LOW (ref 39–117)
CO2: 23 mEq/L (ref 19–32)
GFR calc non Af Amer: >60 mL/min (ref >60)
Glucose, Bld: 78 mg/dL (ref 70–99)
Potassium: 3.8 mEq/L (ref 3.5–5.1)
Sodium: 128 mEq/L — ABNORMAL LOW (ref 135–145)
Total Bilirubin: 1.2 mg/dL (ref 0.3–1.2)

## 2011-03-03 LAB — DIFFERENTIAL
Basophils Relative: 1 % (ref 0–1)
Eosinophils Absolute: 0.1 10*3/uL (ref 0.0–0.7)
Neutrophils Relative %: 83 % — ABNORMAL HIGH (ref 43–77)

## 2011-03-03 LAB — URINALYSIS, ROUTINE W REFLEX MICROSCOPIC
Bilirubin Urine: NEGATIVE
Glucose, UA: NEGATIVE mg/dL
Leukocytes, UA: NEGATIVE
Nitrite: NEGATIVE
Specific Gravity, Urine: 1.023 (ref 1.005–1.030)
pH: 6.5 (ref 5.0–8.0)

## 2011-03-03 LAB — CBC
Hemoglobin: 13 g/dL (ref 12.0–15.0)
RBC: 4.15 MIL/uL (ref 3.87–5.11)

## 2011-03-03 LAB — ETHANOL: Alcohol, Ethyl (B): <5 mg/dL (ref 0–10)

## 2011-03-03 LAB — LIPASE, BLOOD: Lipase: 15 U/L (ref 11–59)

## 2011-03-03 LAB — RAPID URINE DRUG SCREEN, HOSP PERFORMED
Opiates: POSITIVE — AB
Tetrahydrocannabinol: NOT DETECTED

## 2011-03-05 LAB — CBC
MCV: 87.7 fL (ref 78.0–100.0)
RBC: 4.78 MIL/uL (ref 3.87–5.11)
WBC: 8.3 10*3/uL (ref 4.0–10.5)

## 2011-03-05 LAB — COMPREHENSIVE METABOLIC PANEL
ALT: 23 U/L (ref 0–35)
AST: 30 U/L (ref 0–37)
Alkaline Phosphatase: 46 U/L (ref 39–117)
CO2: 23 mEq/L (ref 19–32)
Chloride: 108 mEq/L (ref 96–112)
Creatinine, Ser: 0.63 mg/dL (ref 0.4–1.2)
GFR calc Af Amer: >60 mL/min (ref >60)
GFR calc non Af Amer: >60 mL/min (ref >60)
Potassium: 3.8 mEq/L (ref 3.5–5.1)
Total Bilirubin: 0.9 mg/dL (ref 0.3–1.2)

## 2011-03-05 LAB — DIFFERENTIAL
Basophils Absolute: 0 10*3/uL (ref 0.0–0.1)
Basophils Relative: 0 % (ref 0–1)
Eosinophils Absolute: 0.1 10*3/uL (ref 0.0–0.7)
Eosinophils Relative: 1 % (ref 0–5)

## 2011-03-05 LAB — URINALYSIS, ROUTINE W REFLEX MICROSCOPIC
Glucose, UA: NEGATIVE mg/dL
Ketones, ur: NEGATIVE mg/dL
Nitrite: NEGATIVE
Protein, ur: 30 mg/dL — AB

## 2011-03-05 LAB — LIPASE, BLOOD: Lipase: 37 U/L (ref 11–59)

## 2011-03-05 LAB — URINE MICROSCOPIC-ADD ON

## 2011-03-06 LAB — MAGNESIUM: Magnesium: 1.9 mg/dL (ref 1.5–2.5)

## 2011-03-06 LAB — CBC
HCT: 37.8 % (ref 36.0–46.0)
HCT: 38.7 % (ref 36.0–46.0)
Hemoglobin: 11.8 g/dL — ABNORMAL LOW (ref 12.0–15.0)
MCHC: 33.3 g/dL (ref 30.0–36.0)
MCHC: 34.1 g/dL (ref 30.0–36.0)
MCV: 87.5 fL (ref 78.0–100.0)
MCV: 87.9 fL (ref 78.0–100.0)
MCV: 87.7 fL (ref 78.0–100.0)
Platelets: 225 10*3/uL (ref 150–400)
Platelets: 283 10*3/uL (ref 150–400)
Platelets: 213 10*3/uL (ref 150–400)
RBC: 3.93 MIL/uL (ref 3.87–5.11)
RBC: 3.94 MIL/uL (ref 3.87–5.11)
RDW: 15 % (ref 11.5–15.5)
RDW: 15.1 % (ref 11.5–15.5)
RDW: 15.4 % (ref 11.5–15.5)
RDW: 14.7 % (ref 11.5–15.5)
WBC: 9.4 10*3/uL (ref 4.0–10.5)
WBC: 6.6 10*3/uL (ref 4.0–10.5)

## 2011-03-06 LAB — COMPREHENSIVE METABOLIC PANEL
ALT: 14 U/L (ref 0–35)
ALT: 16 U/L (ref 0–35)
AST: 19 U/L (ref 0–37)
AST: 20 U/L (ref 0–37)
AST: 15 U/L (ref 0–37)
Albumin: 4.2 g/dL (ref 3.5–5.2)
Albumin: 4.1 g/dL (ref 3.5–5.2)
Alkaline Phosphatase: 42 U/L (ref 39–117)
Alkaline Phosphatase: 43 U/L (ref 39–117)
BUN: 11 mg/dL (ref 6–23)
BUN: 12 mg/dL (ref 6–23)
Calcium: 8.7 mg/dL (ref 8.4–10.5)
Chloride: 104 mEq/L (ref 96–112)
Creatinine, Ser: 0.71 mg/dL (ref 0.4–1.2)
GFR calc Af Amer: >60 mL/min (ref >60)
GFR calc Af Amer: >60 mL/min (ref >60)
GFR calc Af Amer: >60 mL/min (ref >60)
Glucose, Bld: 85 mg/dL (ref 70–99)
Potassium: 3.4 mEq/L — ABNORMAL LOW (ref 3.5–5.1)
Potassium: 4.2 mEq/L (ref 3.5–5.1)
Potassium: 3.9 mEq/L (ref 3.5–5.1)
Sodium: 139 mEq/L (ref 135–145)
Sodium: 141 mEq/L (ref 135–145)
Total Bilirubin: 0.6 mg/dL (ref 0.3–1.2)
Total Protein: 6.6 g/dL (ref 6.0–8.3)
Total Protein: 7.3 g/dL (ref 6.0–8.3)

## 2011-03-06 LAB — DIFFERENTIAL
Basophils Absolute: 0.1 10*3/uL (ref 0.0–0.1)
Basophils Absolute: 0.1 10*3/uL (ref 0.0–0.1)
Basophils Relative: 1 % (ref 0–1)
Basophils Relative: 1 % (ref 0–1)
Eosinophils Absolute: 0.2 10*3/uL (ref 0.0–0.7)
Eosinophils Absolute: 0.2 10*3/uL (ref 0.0–0.7)
Eosinophils Relative: 2 % (ref 0–5)
Eosinophils Relative: 1 % (ref 0–5)
Lymphocytes Relative: 28 % (ref 12–46)
Lymphs Abs: 1.8 10*3/uL (ref 0.7–4.0)
Monocytes Absolute: 0.7 10*3/uL (ref 0.1–1.0)
Monocytes Absolute: 0.4 10*3/uL (ref 0.1–1.0)
Monocytes Relative: 7 % (ref 3–12)
Neutro Abs: 4.2 10*3/uL (ref 1.7–7.7)
Neutro Abs: 4.2 10*3/uL (ref 1.7–7.7)
Neutrophils Relative %: 47 % (ref 43–77)

## 2011-03-06 LAB — URINALYSIS, ROUTINE W REFLEX MICROSCOPIC
Hgb urine dipstick: NEGATIVE
Hgb urine dipstick: NEGATIVE
Nitrite: NEGATIVE
Protein, ur: NEGATIVE mg/dL
Specific Gravity, Urine: 1.02 (ref 1.005–1.030)
Specific Gravity, Urine: 1.021 (ref 1.005–1.030)
Urobilinogen, UA: 0.2 mg/dL (ref 0.0–1.0)
Urobilinogen, UA: 1 mg/dL (ref 0.0–1.0)

## 2011-03-06 LAB — BASIC METABOLIC PANEL
BUN: 5 mg/dL — ABNORMAL LOW (ref 6–23)
CO2: 27 mEq/L (ref 19–32)
CO2: 28 mEq/L (ref 19–32)
Calcium: 8.6 mg/dL (ref 8.4–10.5)
Calcium: 9.1 mg/dL (ref 8.4–10.5)
Chloride: 106 mEq/L (ref 96–112)
Creatinine, Ser: 0.67 mg/dL (ref 0.4–1.2)
Creatinine, Ser: 0.72 mg/dL (ref 0.4–1.2)
GFR calc Af Amer: >60 mL/min (ref >60)
GFR calc Af Amer: >60 mL/min (ref >60)
GFR calc non Af Amer: >60 mL/min (ref >60)
Glucose, Bld: 80 mg/dL (ref 70–99)
Sodium: 136 mEq/L (ref 135–145)

## 2011-03-06 LAB — HEMOGLOBIN AND HEMATOCRIT, BLOOD
HCT: 35.5 % — ABNORMAL LOW (ref 36.0–46.0)
HCT: 40 % (ref 36.0–46.0)
Hemoglobin: 12 g/dL (ref 12.0–15.0)
Hemoglobin: 13.7 g/dL (ref 12.0–15.0)

## 2011-03-06 LAB — IGA: IgA: 157 mg/dL (ref 68–378)

## 2011-03-06 LAB — WET PREP, GENITAL
Trich, Wet Prep: NONE SEEN
Yeast Wet Prep HPF POC: NONE SEEN

## 2011-03-06 LAB — GC/CHLAMYDIA PROBE AMP, GENITAL
Chlamydia, DNA Probe: NEGATIVE
GC Probe Amp, Genital: NEGATIVE

## 2011-03-06 LAB — PREGNANCY, URINE: Preg Test, Ur: NEGATIVE

## 2011-03-06 LAB — SEDIMENTATION RATE: Sed Rate: 2 mm/hr (ref 0–22)

## 2011-03-06 LAB — POCT PREGNANCY, URINE: Preg Test, Ur: NEGATIVE

## 2011-03-07 LAB — COMPREHENSIVE METABOLIC PANEL
ALT: 16 U/L (ref 0–35)
Albumin: 4.5 g/dL (ref 3.5–5.2)
Alkaline Phosphatase: 47 U/L (ref 39–117)
Calcium: 9.8 mg/dL (ref 8.4–10.5)
Potassium: 3.7 mEq/L (ref 3.5–5.1)
Sodium: 136 mEq/L (ref 135–145)
Total Protein: 7.4 g/dL (ref 6.0–8.3)

## 2011-03-07 LAB — WET PREP, GENITAL
Trich, Wet Prep: NONE SEEN
Yeast Wet Prep HPF POC: NONE SEEN

## 2011-03-07 LAB — DIFFERENTIAL
Basophils Relative: 0 % (ref 0–1)
Eosinophils Absolute: 0.1 10*3/uL (ref 0.0–0.7)
Lymphs Abs: 2.5 10*3/uL (ref 0.7–4.0)
Monocytes Absolute: 0.5 10*3/uL (ref 0.1–1.0)
Monocytes Relative: 6 % (ref 3–12)
Neutro Abs: 5.9 10*3/uL (ref 1.7–7.7)

## 2011-03-07 LAB — URINALYSIS, ROUTINE W REFLEX MICROSCOPIC
Bilirubin Urine: NEGATIVE
Glucose, UA: NEGATIVE mg/dL
Hgb urine dipstick: NEGATIVE
Specific Gravity, Urine: 1.017 (ref 1.005–1.030)
Urobilinogen, UA: 0.2 mg/dL (ref 0.0–1.0)

## 2011-03-07 LAB — CBC
Platelets: ADEQUATE 10*3/uL (ref 150–400)
RDW: 13.7 % (ref 11.5–15.5)

## 2011-03-07 LAB — GC/CHLAMYDIA PROBE AMP, GENITAL
Chlamydia, DNA Probe: NEGATIVE
GC Probe Amp, Genital: NEGATIVE

## 2011-03-08 LAB — BASIC METABOLIC PANEL
BUN: 17 mg/dL (ref 6–23)
Calcium: 10 mg/dL (ref 8.4–10.5)
Chloride: 106 mEq/L (ref 96–112)
Creatinine, Ser: 0.71 mg/dL (ref 0.4–1.2)
GFR calc Af Amer: >60 mL/min (ref >60)

## 2011-03-08 LAB — URINALYSIS, ROUTINE W REFLEX MICROSCOPIC
Bilirubin Urine: NEGATIVE
Glucose, UA: NEGATIVE mg/dL
Hgb urine dipstick: NEGATIVE
Ketones, ur: NEGATIVE mg/dL
Protein, ur: NEGATIVE mg/dL
Urobilinogen, UA: 0.2 mg/dL (ref 0.0–1.0)

## 2011-03-08 LAB — DIFFERENTIAL
Basophils Relative: 0 % (ref 0–1)
Eosinophils Absolute: 0 10*3/uL (ref 0.0–0.7)
Lymphs Abs: 2.2 10*3/uL (ref 0.7–4.0)
Monocytes Relative: 8 % (ref 3–12)
Neutro Abs: 8.4 10*3/uL — ABNORMAL HIGH (ref 1.7–7.7)
Neutrophils Relative %: 73 % (ref 43–77)

## 2011-03-08 LAB — RAPID URINE DRUG SCREEN, HOSP PERFORMED
Amphetamines: NOT DETECTED
Benzodiazepines: NOT DETECTED
Cocaine: NOT DETECTED
Tetrahydrocannabinol: NOT DETECTED

## 2011-03-08 LAB — CBC
MCV: 90.7 fL (ref 78.0–100.0)
Platelets: 270 10*3/uL (ref 150–400)
RBC: 4.16 MIL/uL (ref 3.87–5.11)
WBC: 11.5 10*3/uL — ABNORMAL HIGH (ref 4.0–10.5)

## 2011-03-08 LAB — ETHANOL: Alcohol, Ethyl (B): <5 mg/dL (ref 0–10)

## 2011-03-09 LAB — CBC
HCT: 30.6 % — ABNORMAL LOW (ref 36.0–46.0)
HCT: 36.5 % (ref 36.0–46.0)
Hemoglobin: 10.4 g/dL — ABNORMAL LOW (ref 12.0–15.0)
Hemoglobin: 12.5 g/dL (ref 12.0–15.0)
MCHC: 34 g/dL (ref 30.0–36.0)
MCHC: 34.5 g/dL (ref 30.0–36.0)
MCV: 90.1 fL (ref 78.0–100.0)
RBC: 3.46 MIL/uL — ABNORMAL LOW (ref 3.87–5.11)
RBC: 4.04 MIL/uL (ref 3.87–5.11)
RDW: 15.3 % (ref 11.5–15.5)
RDW: 15.6 % — ABNORMAL HIGH (ref 11.5–15.5)
WBC: 9.4 10*3/uL (ref 4.0–10.5)

## 2011-03-09 LAB — POCT I-STAT, CHEM 8
Calcium, Ion: 1.04 mmol/L — ABNORMAL LOW (ref 1.12–1.32)
HCT: 42 % (ref 36.0–46.0)
Sodium: 139 mEq/L (ref 135–145)
TCO2: 23 mmol/L (ref 0–100)

## 2011-03-09 LAB — URINALYSIS, ROUTINE W REFLEX MICROSCOPIC
Glucose, UA: NEGATIVE mg/dL
Protein, ur: NEGATIVE mg/dL
Specific Gravity, Urine: 1.008 (ref 1.005–1.030)
Urobilinogen, UA: 0.2 mg/dL (ref 0.0–1.0)

## 2011-03-09 LAB — DIFFERENTIAL
Basophils Absolute: 0.1 10*3/uL (ref 0.0–0.1)
Lymphocytes Relative: 27 % (ref 12–46)
Lymphs Abs: 2.5 10*3/uL (ref 0.7–4.0)
Monocytes Absolute: 0.6 10*3/uL (ref 0.1–1.0)
Monocytes Relative: 7 % (ref 3–12)
Neutro Abs: 6.1 10*3/uL (ref 1.7–7.7)

## 2011-03-09 LAB — HEPATIC FUNCTION PANEL
Albumin: 4 g/dL (ref 3.5–5.2)
Total Protein: 6.9 g/dL (ref 6.0–8.3)

## 2011-03-09 LAB — COMPREHENSIVE METABOLIC PANEL
BUN: 6 mg/dL (ref 6–23)
CO2: 28 mEq/L (ref 19–32)
Calcium: 8.8 mg/dL (ref 8.4–10.5)
Creatinine, Ser: 0.79 mg/dL (ref 0.4–1.2)
GFR calc Af Amer: >60 mL/min (ref >60)
GFR calc non Af Amer: >60 mL/min (ref >60)
Glucose, Bld: 90 mg/dL (ref 70–99)

## 2011-03-09 LAB — BASIC METABOLIC PANEL
BUN: 10 mg/dL (ref 6–23)
CO2: 27 mEq/L (ref 19–32)
Chloride: 104 mEq/L (ref 96–112)
Potassium: 3.4 mEq/L — ABNORMAL LOW (ref 3.5–5.1)

## 2011-03-09 LAB — CORTISOL: Cortisol, Plasma: 0.5 ug/dL

## 2011-03-09 LAB — LIPASE, BLOOD: Lipase: 25 U/L (ref 11–59)

## 2011-03-09 LAB — MAGNESIUM: Magnesium: 2.1 mg/dL (ref 1.5–2.5)

## 2011-03-09 LAB — POCT PREGNANCY, URINE: Preg Test, Ur: NEGATIVE

## 2011-03-09 LAB — PROTIME-INR: INR: 1.2 (ref 0.00–1.49)

## 2011-03-10 LAB — CBC
HCT: 36.9 % (ref 36.0–46.0)
Hemoglobin: 12.5 g/dL (ref 12.0–15.0)
MCV: 89.7 fL (ref 78.0–100.0)
Platelets: 245 10*3/uL (ref 150–400)
RDW: 16.2 % — ABNORMAL HIGH (ref 11.5–15.5)
WBC: 10.9 10*3/uL — ABNORMAL HIGH (ref 4.0–10.5)

## 2011-03-10 LAB — DIFFERENTIAL
Basophils Absolute: 0.1 10*3/uL (ref 0.0–0.1)
Basophils Relative: 1 % (ref 0–1)
Lymphocytes Relative: 31 % (ref 12–46)
Monocytes Absolute: 0.9 10*3/uL (ref 0.1–1.0)
Neutro Abs: 6.4 10*3/uL (ref 1.7–7.7)
Neutrophils Relative %: 59 % (ref 43–77)

## 2011-03-10 LAB — COMPREHENSIVE METABOLIC PANEL
Albumin: 3.8 g/dL (ref 3.5–5.2)
BUN: 12 mg/dL (ref 6–23)
Chloride: 102 mEq/L (ref 96–112)
Creatinine, Ser: 0.74 mg/dL (ref 0.4–1.2)
Glucose, Bld: 97 mg/dL (ref 70–99)
Total Bilirubin: 0.5 mg/dL (ref 0.3–1.2)

## 2011-03-10 LAB — URINALYSIS, ROUTINE W REFLEX MICROSCOPIC
Nitrite: NEGATIVE
Specific Gravity, Urine: 1.033 — ABNORMAL HIGH (ref 1.005–1.030)
pH: 6 (ref 5.0–8.0)

## 2011-03-10 LAB — LIPASE, BLOOD: Lipase: 22 U/L (ref 11–59)

## 2011-03-10 LAB — URINE MICROSCOPIC-ADD ON

## 2011-03-15 LAB — GC/CHLAMYDIA PROBE AMP, GENITAL
Chlamydia, DNA Probe: NEGATIVE
GC Probe Amp, Genital: NEGATIVE

## 2011-03-15 LAB — POCT PREGNANCY, URINE: Preg Test, Ur: NEGATIVE

## 2011-03-15 LAB — WET PREP, GENITAL

## 2011-04-13 NOTE — Consult Note (Signed)
NAMEALEXIAH, KOROMA NO.:  [id number]   MEDICAL RECORD NO.:  [id number]          PATIENT TYPE:  OBV   LOCATION:  6735                         FACILITY:  [facility]   PHYSICIAN:  Antonietta Breach, M.D.  DATE OF BIRTH:  [birth year]   DATE OF CONSULTATION:  DATE OF DISCHARGE:  07/26/2009                                 CONSULTATION   ADDENDUM:  If needed Mrs. Children could utilize 12-step meetings and  groups as well as alcohol abuse counseling if alcohol  relapse is a  problem.   Regarding the use of Remeron, would be cautious about excessive  sedation/ With the Remeron she may not require the Ambien p.r.n., but  would make the Ambien available one hour after the Remeron p.r.n.  insomnia.      Antonietta Breach, M.D.  Electronically Signed     JW/MEDQ  D:  07/26/2009  T:  07/26/2009  Job:  [id number]

## 2011-04-13 NOTE — H&P (Signed)
NAMEGIANNY, KILLMAN NO.:  [id number]   MEDICAL RECORD NO.:  [id number]          PATIENT TYPE:  INP   LOCATION:  1824                         FACILITY:  [facility]   PHYSICIAN:  Vania Rea, M.D. DATE OF BIRTH:  Apr 18, 1986   DATE OF ADMISSION:  07/22/2009  DATE OF DISCHARGE:                              HISTORY & PHYSICAL   PRIMARY CARE PHYSICIAN:  Unassigned.   CHIEF COMPLAINT:  Vomiting blood for 4 days.   HISTORY OF PRESENT ILLNESS:  This is a 25 year old Caucasian lady with a  past history of peptic ulcer disease, apparently related to alcohol  binge drinking who is no longer abusing alcohol and reports that she is  reportedly chronically taking high-dose Nexium as well as Carafate.  Reports that she has been having persistent vomiting of blood for the  past 4 days.  The patient says she was able to eat a soft diet for the  first 3 days and that food did not aggravate the vomiting nor did it  relieve the vomiting, nor did it aggravate the associated abdominal  pain.  However, the patient says for the past 24 hours she has been  unable to eat or keep anything down.  She says she never looks of her  stools, so she does not know what color it is.  But it tends to be hard,  not soft, not pasty.  She denies use of any NSAIDS or over-the-counter  medication.  The patient has been in the emergency room for many hours  and no vomiting has been witnessed.  However, one of the nurse's report  is bright red blood on tissue paper and the emesis back.  A gastric  lavage was ordered for the patient, but the patient became very agitated  and upset and refused gastric lavage because she says that she has never  had anything like that done before.  Procedure was explained, she still  refused.   Prior to the hospitalist consult, the patient's lab work were completely  normal with no evidence of anemia or dehydration despite date of  vomiting blood and over 24 hours not  eating or drinking.   PAST MEDICAL HISTORY:  1. History of acid reflux.  2. History of hiatal hernia.  3. History of peptic ulcer disease.   MEDICATIONS:  NEXIUM AND CARAFATE.   ALLERGIES:  FLAGYL CAUSES UPSET STOMACH.   SOCIAL HISTORY:  She reports that she has discontinued tobacco use.  Denies alcohol or illicit drug use.   FAMILY HISTORY:  Significant for diabetes as well as Crohn's disease in  her mother.   REVIEW OF SYSTEMS:  On a 10-point review of systems, other than noted  above, unremarkable.   PHYSICAL EXAMINATION:  GENERAL:  Agitated and anxious young Caucasian  lady, reclining in the bed.  VITALS:  Temperature is 98.4, pulse 83, respiration 18, blood pressure  129/72.  She is saturating at 100% on room air.  She describes the  abdominal pain as ranging from a 6-10/10.  HEENT:  Her pupils are round equal and reactive.  Mucous membranes pink  and anicteric.  She is not dehydrated.  No cervical lymphadenopathy or  thyromegaly.  No jugular venous distension.  Oral cavity:  There is no  evidence of bleeding or discoloration of the teeth or gums or pharynx.  CHEST:  Clear to auscultation bilaterally.  CARDIOVASCULAR SYSTEM:  Regular rhythm.  No murmur.  ABDOMEN:  Soft to palpation throughout the exam, but at the end of the  exam, she reports tenderness midway between the epigastrium and the  navel.  EXTREMITIES:  Without edema.  Dorsalis pedis pulses 2+ bilaterally.  She  has no calf tenderness.  She has no bony joint deformity.  SKIN:  Warm and dry.  She has multiple nipple piercings.  CENTRAL NERVOUS SYSTEM: Cranial nerves II-XII are grossly intact.  She  has no focal neurologic deficit.   LABORATORY DATA:  Her white count is 9.4, hemoglobin 12.8, MCV 87.3,  platelets 283.  She has a normal differential on her white count.  Her  sodium is 141, potassium 4.2, chloride 104, CO2 32, glucose 63, BUN 11,  creatinine 0.77.  Liver function tests unremarkable.  Lipase 22.   Urinalysis shows specific gravity of 1.020, pH is 7, negative for  ketones negative for protein negative for nitrites or leukocyte  esterase.   ASSESSMENT:  1. Anxiety.  2. History of 4 days of vomiting and 24 hours of fasting with normal      labs and normal urinalysis.  3. Past history of peptic ulcer disease.   PLAN:  Will admit this lady to telemetry for observation.  Will check  hemoglobin/hematocrit every 4 hours.  Will keep her n.p.o., give her IV  fluids,  treat intravenous Protonix, Zofran and benzodiazepines p.r.n.      Vania Rea, M.D.  Electronically Signed     LC/MEDQ  D:  07/22/2009  T:  07/22/2009  Job:  [id number]   cc:   Gastroenterology Dr. Jarold Motto
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
## 2011-04-13 NOTE — Discharge Summary (Signed)
NAMEAIMIE, Krystal Humphrey            ACCOUNT NO.:  [id number]   MEDICAL RECORD NO.:  [id number]          PATIENT TYPE:  OBV   LOCATION:  6735                         FACILITY:  [facility]   PHYSICIAN:  Isidor Holts, M.D.  DATE OF BIRTH:  [birth year]   DATE OF ADMISSION:  07/21/2009  DATE OF DISCHARGE:  07/26/2009                               DISCHARGE SUMMARY   PRIMARY CARE PHYSICIAN:  Unassigned.   PRIMARY GASTROENTEROLOGIST:  Vania Rea. Jarold Motto, MD, Clementeen Graham, FACP, FAGA   DISCHARGE DIAGNOSES:  1. Acute gastritis.  2. Transient hematemesis, secondary to #1, and retching.  3. Chronic functional abdominal pain.  4. Anxiety/ depression.  5. History of peptic ulcer disease.  6. Gastroesophageal reflux disease/hiatal hernia.   DISCHARGE MEDICATIONS:  1. Nexium 40 mg p.o. b.i.d.  2. Vicodin (5/500) one p.o. p.r.n. q. 6h. A total of 28 pills have      been dispensed.  3. Remeron 7.5 mg p.o. q.h.s.  4. Reglan 5 mg p.o. q.a.c. and h.s.   Note:  Carafate has been discontinued.   PROCEDURES:  None.   CONSULTATIONS:  1. Dr  Iva Boop, MD,  gastroenterologist.  2. Dr. Antonietta Breach, psychiatrist.   ADMISSION HISTORY:  As in H and P notes of July 22, 2009, dictated by  Dr. Vania Rea.  However, in brief, this is a 25 year old female,  with known history of peptic ulcer disease, hiatal hernia, GERD, chronic  abdominal pain, anxiety, depression, presenting with a 4-day history of  persistent vomiting, associated with blood.  No associated diarrhea.  She was admitted for further evaluation, investigation and management.   CLINICAL COURSE.:  1. Acute gastritis.  For details of presentation, refer to admission      history above.  The patient was placed on bowel rest, intravenous      fluid hydration, antiemetics, proton pump inhibitor, with      satisfactory clinical response.  Because of history of hematemesis      as part of her symptomatology, Gastroenterology consultation  was      called, which was kindly provided by Dr. Stan Head, who opined      that the patient has in the past had multiple previous workups and      as a matter of fact, had a normal EGD in April 2010.  He felt that      at the present time, no current indication for repeat EGD existed,      unless hematemesis became persistent or profuse.  Be that as it      may, the patient had only a couple of blood streaks noted in      subsequent vomitus, and remained hemodynamically stable.      Hemoglobin also remained stable.  As a matter of fact as of July 24, 2009 hemoglobin was 11.5.  Over the course of the patient's      hospitalization, symptoms gradually ameliorated and we were able to      discontinue intravenous fluids and advance diet, without any      deleterious consequences.  As of  July 26, 2009 she was completely      asymptomatic.   1. Chronic abdominal pain.  The patient has a history of chronic      abdominal pain and has had extensive evaluations in the past, which      were unrevealing.  Per GI, this is likely secondary to chronic      functional abdominal pain.   1. Depression.  The patient has a history of anxiety/ depression.      During the course of her hospitalization she did appear      significantly depressed.  Consensus, was that psychiatric      evaluation was indicated.  She was seen by Dr. Antonietta Breach on      July 25, 2009, who opined  that she had mood disorder      nonspecified as well as major depression, and has recommended      Remeron in an initial dose of 7.5 mg p.o. q.h.s.  He has also      recommended outpatient psychiatric counseling on discharge.   1. History of GERD/hiatal hernia.  The patient was managed with twice-      daily proton pump inhibitor.   DISPOSITION:  The patient was on 07/26/2009, asymptomatic and keen to be  discharged.  There were no new issues.  She was therefore, discharged  accordingly.   DIET:  No  restrictions.   ACTIVITY:  As tolerated.   FOLLOW-UP INSTRUCTIONS:  The patient is to follow up with her primary  gastroenterologist, Dr. Sheryn Bison, telephone number [phone].  An appointment has been scheduled for September 23 at 8:45 a.m.  In  addition, outpatient psychiatric counseling has been arranged.      Isidor Holts, M.D.  Electronically Signed     CO/MEDQ  D:  07/26/2009  T:  07/26/2009  Job:  [id number]   cc:   Vania Rea. Jarold Motto, MD, Caleen Essex, FAGA

## 2011-04-13 NOTE — H&P (Signed)
NAME:  Krystal, Humphrey NO.:  [id number]   MEDICAL RECORD NO.:  [id number]           PATIENT TYPE:   LOCATION:                                 FACILITY:   PHYSICIAN:  Richarda Overlie, MD       DATE OF BIRTH:  1986/09/02   DATE OF ADMISSION:  DATE OF DISCHARGE:                              HISTORY & PHYSICAL   This is an admission note for [facility].   CHIEF COMPLAINT:  Right upper quadrant pain and hematemesis.   HISTORY OF PRESENT ILLNESS:  This is a 25 year old white woman with past  medical history of chronic abdominal pain and hiatal hernia and peptic  ulcer disease diagnosed in 2008 and 2009 by EGD with recent history of  hematemesis last month and EGD 2 weeks ago, normal.  She had a normal CT  abdomen less than a month ago.  She presents with hematemesis and right upper quadrant pain.  The pain  was on and off for may be a year, but greatly increased today.  She  vomited blood per her report 2 times since this a.m.  Pain is 10/10 in  the right abdomen, stabbing, associated with nausea at home.  No  diarrhea, constipation, fever, or chills.  No menstrual irregularities  or association with cycles.  No dysuria or urinary frequency.   PAST MEDICAL HISTORY:  1. Hematemesis as mentioned above.  EGD by Dr. Sheryn Bison was      nonrevealing 2 weeks ago.  2. History of bacterial vaginosis, January 2010.   HOME MEDICATIONS:  Nexium 40 mg p.o. b.i.d. for the last week and she is  to continue for 1 more week, then switch to daily.   SOCIAL HISTORY:  She lives in [place] with boyfriend and his parents.  She is divorcing now, takes care of boyfriend's mom.  Occasional  drinking alcohol, socially.  However, she did not drink in a month.  No  drugs.   ALLERGIES:  No known drug allergies.   REVIEW OF SYSTEMS:  Positive for nausea, vomiting, and hematemesis.  She  has regular menstrual cycles.  No dysuria.  No headaches.  No dizziness.   PHYSICAL  EXAMINATION:  VITAL SIGNS:  Temperature 97.7, pulse 87, blood  pressure 111/69, respiratory rate 18, and oxygen saturation 100% on room  air.  GENERAL:  She is in no acute distress.  She has no hyperpigmentation on  skin.  CARDIAC:  Regular rate and rhythm.  No murmurs, rubs, or gallops.  PULMONARY:  Clear to auscultation bilaterally.  GI:  Soft and tender to palpation in the right abdomen.  Murphy  positive.  No rebound.  Plus voluntary guarding.  EXTREMITIES:  No edema.  NEURO:  Intact peripheral fields.   LABORATORY DATA:  Lipase 95, urinalysis negative with a specific gravity  of 1.008, UPT negative.  I-STAT; sodium 139, potassium 3.5, chloride  106, bicarb 23, BUN 11, creatinine 0.9, glucose 78, bilirubin 0.5, alk  phos 49, ALT 18, AST 14, protein 6.9, and albumin 4.0.   CT of the pelvis and abdomen was negative on  04/12.   ASSESSMENT AND PLAN:  1. Hematemesis.  Questionable diagnosis especially with normal EGD 2      weeks ago.  She does not use NSAIDs and she takes Nexium double      dose at home.  We will admit her to observe.  We will document all      emesis.  Check hemoccult, check UDS, check CBC, and give Protonix      IV b.i.d.  2. Acute on chronic abdominal pain with questionable etiology.  She      had normal EGD 2 weeks ago.  Normal CT abdomen and pelvis less than      a month ago.  Normal lipase.  Normal LFTs.  Normal urinalysis.      Negative UPT.  She has no risk factors for gastroparesis.  She has      a HIDA scan scheduled for 1 week from today.  We will try to do      this tomorrow.  We will hold opiates and Reglan before the test.      We will give her Percocet and Phenergan, but no NSAIDs because of      GI bleed.  We will check acute abdominal series and we will check      cortisol at 8 a.m.  If does not feel better, she will need GI      consult in the morning.  3. Prophylaxis, Protonix and SCDs.   Case and plan was discussed with Dr. Susie Cassette.       Carlus Pavlov, M.D.  Electronically Signed      Richarda Overlie, MD  Electronically Signed    CG/MEDQ  D:  04/03/2009  T:  04/04/2009  Job:  [id number]

## 2011-04-13 NOTE — Assessment & Plan Note (Signed)
[facility]                         GASTROENTEROLOGY OFFICE NOTE   KALIOPE, QUINONEZ                         MRN:          [id number]  DATE:11/09/2007                            DOB:          [birth year]    Shawnie returns for followup of her endoscopy which was completed on  October 10, 2007.  She had a small hiatal hernia with mild distal  esophagitis.  Gastric biopsy for Helicobacter pylori was negative.  Lab  data was all normal as was upper abdominal ultrasound exam.   Since being on Aciphex 20 mg a day, the patient is almost 100% better,  but continues with some mild reflux symptoms.  I have asked her to  continue Aciphex for another 2 months and then to see me in followup,  and we will consider stopping her medication if she is doing well at  that time.  I again reviewed chronic reflux regimen with her and her  grandmother.     Vania Rea. Jarold Motto, MD, Caleen Essex, FAGA  Electronically Signed    DRP/MedQ  DD: 11/09/2007  DT: 11/09/2007  Job #: [id number]

## 2011-04-13 NOTE — Discharge Summary (Signed)
NAMEELVERTA, DIMICELI NO.:  [id number]   MEDICAL RECORD NO.:  [id number]          PATIENT TYPE:  INP   LOCATION:  5127                         FACILITY:  [facility]   PHYSICIAN:  Lonia Blood, M.D.DATE OF BIRTH:  25-Oct-1986   DATE OF ADMISSION:  04/03/2009  DATE OF DISCHARGE:  04/06/2009                               DISCHARGE SUMMARY   PRIMARY CARE PHYSICIAN:  Unassigned.   DISCHARGE DIAGNOSES:  1. Idiopathic abdominal pain      a.     Possible constipation.      b.     Full evaluation unrevealing.      c.     Followed extensively in the outpatient setting by       gastroenterology - Dr. Vania Rea. Jarold Motto.  2. Reported hematemesis - No evidence of such  during hospital stay -      resolved.  3. Low random serum cortisol      a.     No clinical symptoms to suggest true adrenal insufficiency.      b.     No electrolyte abnormalities to suggest true adrenal       insufficiency.      c.     Outpatient followup recommended.  4. Elevated thyroid stimulating hormone      a.     No symptoms to suggest hypothyroidism.      b.     Free T4 normal.  5. Normocytic anemia - likely secondary to normal menstrual bleeding -      hemoglobin stable.   DISCHARGE MEDICATIONS:  Nexium 40 mg p.o. daily.   FOLLOWUP:  1. The patient is advised to follow up with her primary care physician      of choice, within seven to 10 days.  2. The patient is advised to obtain a GYN physician for followup.  It      is presently the weekend, and I am not able to make an appointment      for her.  It is advised that if she should continue to have      abdominal pain, that she should consider an evaluation of      endometriosis through her GYN physician.   CONSULTATIONS:  None.   PROCEDURE:  1. A CT scan of the abdomen and pelvis on March 10, 2009:  No acute      abdominal process.  Normal appendix.  No acute pelvic process.  2. A CT scan of the head on April 11,2010:  Normal exam.  3.  Nuclear medicine hepatobiliary scan on Apr 04, 2009:  Patency of      cystic and common bile ducts.  Normal gallbladder ejection      fraction.   HOSPITAL COURSE:  Ms. Kuulei Kleier is a 25 year old female with a  longstanding history of greater than one year of diffuse crampy  abdominal pain.  She is followed in the outpatient setting by Dr. Vania Rea. Jarold Motto, gastroenterologist.  She has undergone an EGD as well as  multiple other outpatient evaluations.  The patient  presented the  patient the  emergency room originally with complaints of right upper  quadrant abdominal pain and hematemesis.  During her hospital stay, no  recurrent hematemesis was appreciated.  The patient's hemoglobin  remained stable throughout her hospital stay.  H. pylori antibody was  assessed and was found to be normal.  Urine pregnancy was assessed and  was found to be normal.  Lipase and amylase were both normal.  Sedimentation rate was normal as well.  To rule out nonfunctioning  gallbladder as an etiology, the patient received a nuclear medicine  hepatobiliary scan.  This was unrevealing.  In fact, it was noted to be  entirely normal.  The patient did display some evidence to suggest drug-  seeking behavior.  The patient was counseled extensively as to the  inadvisability of ongoing narcotic use with crampy abdominal pain due to  the possibility of developing severe constipation.  The patient did  admit to constipation.  Bile stimulants were administered and the  patient tolerated this well.  She was moving her bowels before she was  ready for discharge.   Of note, random serum cortisol was obtained during the patient's  hospital stay.  This returned quite low at 0.5.  The exact etiology of  this was not clear.  A repeat study was carried out, and again the  cortisol returned at 0.6.  This was a morning level drawn at  approximately 6:30 a.m.  The patient has no other metabolic findings to  suggest a  true adrenal insufficiency.  There is no evidence on CT scan  of a hypothalamic or pituitary lesion of any significant size, and in  fact a TSH was found to be elevated, rather than depressed.  Serum free  T4 was obtained and found to be well within normal limits.  Therefore  the patient is not felt to have a thyroid issue.   At the present time, given a complete lack of clinical symptoms, further  inpatient evaluation of the patient's low random serum cortisol level is  not felt to be indicated.  In the outpatient setting, however, it would  be advisable to pursue a repeat random serum cortisol and potentially an  ACTH stimulation test, should the patient's follow-up cortisol level  continue to be subnormal.   On Apr 06, 2009, the patient was deemed to be stable for discharge.  Her  vital signs were stable and she was afebrile.  She is tolerating a  regular diet, with no evidence of diarrhea, with a significant decrease  in her abdominal pain, and no hematemesis, nausea or vomiting.  The  patient is counseled as to the absolute necessity of her obtaining a  primary care physician and a gynecologist.  She states that she intends  to arrange this herself.      Lonia Blood, M.D.  Electronically Signed     JTM/MEDQ  D:  04/06/2009  T:  04/06/2009  Job:  [id number]

## 2011-04-13 NOTE — Assessment & Plan Note (Signed)
[facility]                         GASTROENTEROLOGY OFFICE NOTE   Krystal Humphrey, Krystal Humphrey                         MRN:          [id number]  DATE:01/05/2008                            DOB:          1986/07/28    [name] is doing better with her epigastric pain, but continues to have  some aching discomfort in the late afternoon.  She denies typical reflux  symptoms and is following her reflux regimen closely.   As part of her workup, she had a fairly negative endoscopy, except for a  small hiatal hernia, noted on October 10, 2007.  CLO biopsy for H. pylori was negative.  Upper abdominal ultrasound exam  and laboratory data were all normal.   She is a healthy-appearing white female, in no distress, appearing her  stated age.  She weighs 167 pounds and blood pressure is 104/70 and  pulse was 84 and regular.  Abdominal exam was unremarkable, except for  some tenderness in the subxiphoid area.  I could not appreciate any mass  or hepatomegaly.  Bowel sounds were normal and there was no bruit noted.   ASSESSMENT:  I think Krystal Humphrey has acid reflux problems, is having break-  through symptoms in the late afternoon.   RECOMMENDATIONS:  1. Continue reflux regimen.  2. Increase Aciphex to 20 mg twice a day.  3. Office followup in one month's time.  Consider 24-hour pH probe      testing and manometry, depending on her clinical course.     Krystal Rea. Jarold Motto, MD, Caleen Essex, FAGA  Electronically Signed    DRP/MedQ  DD: 01/05/2008  DT: 01/05/2008  Job #: [id number]

## 2011-04-13 NOTE — Assessment & Plan Note (Signed)
[facility]                         GASTROENTEROLOGY OFFICE NOTE   Krystal Humphrey, Krystal Humphrey                         MRN:          [id number]  DATE:10/09/2007                            DOB:          1986-09-15    Krystal Humphrey is an 25 year old white female granddaughter of Krystal Humphrey, one of my long-term patients.  She comes to the office today, on  the advice of her grandmother, for evaluation of epigastric pain.   HISTORY OF PRESENT ILLNESS:  For the last 4 months Krystal Humphrey has had almost  daily postprandial sharp stabbing pain in her subxiphoid area with two  episodes of severe pain lasting several hours in duration.  She denies  true burning substernal pain or acid reflux regurgitation.  The pain has  no radiation and really has no real precipitating or alleviating  elements.  She has not tried H2 blockers or antacids.  She denies  nocturnal wakening, any hepatobiliary complaints, or lower problems,  change in bowel habits, melena or hematochezia.  She follows a regular  diet.  Her appetite has been good and she denies any specific food  intolerances and she has had no weight loss.  She has never had prior GI  evaluations, barium studies or endoscopic exams.  She denies abuse of  salicylates or NSAIDs.  She has had no associated hepatobiliary problem  such as clay-colored stools, dark urine, icterus, fever or chills.   The patient does currently describe a dry, nonproductive, cough with low-  grade fever and general malaise.  She does not smoke cigarettes and has  no history of recurrent pulmonary infections.   PAST MEDICAL HISTORY:  Is otherwise noncontributory.   MEDICATIONS:  None.   ALLERGIES:  None.   FAMILY HISTORY:  1. There are several family members with Crohn's disease.  2. Her grandmother has severe stenosing fistulizing disease.  3. Also remarkable for diabetes mellitus.   SOCIAL HISTORY:  1. She is single and lives with her  mother.  2. She has a high school education and works in a [employer] trader.  3. She denies abuse of cigarettes or alcohol.   REVIEW OF SYSTEMS:  Noncontributory except for painful menses.  Her last  menstrual period was September 23, 2007.  She specifically denies any  other pulmonary or cardiovascular, genitourinary, gynecologic or  neurologic or psychiatric difficulties.   EXAMINATION:  She is an attractive, healthy-appearing, white female in  no distress, appearing her stated age.  She is 5 feet 8 inches tall and  weighs 161 pounds.  Blood pressure was 110/70 and pulse was 82 and  regular.  I could not appreciate stigmata of chronic liver disease or  thyromegaly.  CHEST:  Had some scattered wheezes in her right lung base but otherwise  there were no rales or rhonchi.  She appeared to be in a regular rhythm  without murmurs, gallops or rubs.  There was no hepatosplenomegaly, abdominal masses or tenderness.  Bowel  sounds were normal.  PERIPHERAL EXTREMITIES:  Unremarkable.  MENTAL STATUS:  Clear.  RECTAL EXAM:  Deferred.   ASSESSMENT:  1. Probable acid reflux disease with associated hiatal hernia.  I am      somewhat concerned about the severity of her symptoms at her young      age.  We, of course, need to exclude cholelithiasis.  2. Probable acute bronchitis.  3. Strong family history of inflammatory bowel disease.   RECOMMENDATIONS:  1. Check screening laboratory parameters including CBC, metabolic      profile and sed rate.  2. Outpatient endoscopy and ultrasound.  3. Reflux regimen and movie. I have started Aciphex 20 mg thirty      minutes before the first meal of the day.  4. Z-Pak for her bronchitis, awaiting further workup.     Krystal Humphrey. Jarold Motto, MD, Caleen Essex, FAGA  Electronically Signed    DRP/MedQ  DD: 10/09/2007  DT: 10/09/2007  Job #: [id number]

## 2011-09-01 ENCOUNTER — Encounter (HOSPITAL_COMMUNITY): Payer: Self-pay | Admitting: *Deleted

## 2011-09-01 ENCOUNTER — Inpatient Hospital Stay (HOSPITAL_COMMUNITY)
Admission: AD | Admit: 2011-09-01 | Discharge: 2011-09-02 | Disposition: A | Discharge status: Home / Self Care | Payer: Medicaid Other | Source: Ambulatory Visit | Attending: Obstetrics and Gynecology | Admitting: Obstetrics and Gynecology

## 2011-09-01 DIAGNOSIS — O479 False labor, unspecified: Secondary | ICD-10-CM | POA: Insufficient documentation

## 2011-09-01 NOTE — Progress Notes (Signed)
contractionsx 4 hours, denies bleeding or ROM, reports good fetal movement. G1

## 2011-09-02 MED ORDER — ZOLPIDEM TARTRATE 10 MG PO TABS
10.0000 mg | ORAL_TABLET | Freq: Once | ORAL | Status: AC
  Administered 2011-09-02: 10 mg via ORAL
  Filled 2011-09-02: qty 1

## 2011-09-21 ENCOUNTER — Telehealth (HOSPITAL_COMMUNITY): Payer: Self-pay | Admitting: *Deleted

## 2011-09-21 ENCOUNTER — Encounter (HOSPITAL_COMMUNITY): Payer: Self-pay | Admitting: *Deleted

## 2011-09-21 NOTE — Telephone Encounter (Signed)
Preadmission screen  

## 2011-09-23 ENCOUNTER — Encounter (HOSPITAL_COMMUNITY): Payer: Self-pay

## 2011-09-23 ENCOUNTER — Ambulatory Visit (HOSPITAL_COMMUNITY): Payer: Medicaid Other | Admitting: Anesthesiology

## 2011-09-23 ENCOUNTER — Encounter (HOSPITAL_COMMUNITY): Payer: Self-pay | Admitting: Anesthesiology

## 2011-09-23 ENCOUNTER — Inpatient Hospital Stay (HOSPITAL_COMMUNITY)
Admission: RE | Admit: 2011-09-23 | Discharge: 2011-09-26 | DRG: 775 | Disposition: A | Discharge status: Home / Self Care | Payer: Medicaid Other | Source: Ambulatory Visit | Attending: Obstetrics and Gynecology | Admitting: Obstetrics and Gynecology

## 2011-09-23 HISTORY — DX: Major depressive disorder, single episode, unspecified: F32.9

## 2011-09-23 HISTORY — DX: Depression, unspecified: F32.A

## 2011-09-23 LAB — CBC
HCT: 34.1 % — ABNORMAL LOW (ref 36.0–46.0)
Hemoglobin: 11.6 g/dL — ABNORMAL LOW (ref 12.0–15.0)
MCV: 90 fL (ref 78.0–100.0)
RBC: 3.79 MIL/uL — ABNORMAL LOW (ref 3.87–5.11)
WBC: 15.1 10*3/uL — ABNORMAL HIGH (ref 4.0–10.5)

## 2011-09-23 MED ORDER — OXYTOCIN 20 UNITS IN LACTATED RINGERS INFUSION - SIMPLE: 125.0000 mL/h | Freq: Once | INTRAVENOUS | Status: DC

## 2011-09-23 MED ORDER — ONDANSETRON HCL 4 MG/2ML IJ SOLN
4.0000 mg | Freq: Four times a day (QID) | INTRAMUSCULAR | Status: DC | PRN
  Administered 2011-09-23 – 2011-09-24 (×2): 4 mg via INTRAVENOUS
  Filled 2011-09-23 (×2): qty 2

## 2011-09-23 MED ORDER — OXYTOCIN 20 UNITS IN LACTATED RINGERS INFUSION - SIMPLE
1.0000 m[IU]/min | INTRAVENOUS | Status: DC
  Administered 2011-09-23: 1 m[IU]/min via INTRAVENOUS
  Filled 2011-09-23: qty 1000

## 2011-09-23 MED ORDER — LACTATED RINGERS IV SOLN
500.0000 mL | Freq: Once | INTRAVENOUS | Status: AC
  Administered 2011-09-23: 1000 mL via INTRAVENOUS

## 2011-09-23 MED ORDER — LACTATED RINGERS IV SOLN: 500.0000 mL | INTRAVENOUS | Status: DC | PRN

## 2011-09-23 MED ORDER — OXYTOCIN BOLUS FROM INFUSION
500.0000 mL | Freq: Once | INTRAVENOUS | Status: AC
  Administered 2011-09-24: 500 mL via INTRAVENOUS
  Filled 2011-09-23: qty 1000
  Filled 2011-09-23: qty 500

## 2011-09-23 MED ORDER — PHENYLEPHRINE 40 MCG/ML (10ML) SYRINGE FOR IV PUSH (FOR BLOOD PRESSURE SUPPORT)
80.0000 ug | PREFILLED_SYRINGE | INTRAVENOUS | Status: DC | PRN
  Filled 2011-09-23: qty 5

## 2011-09-23 MED ORDER — PHENYLEPHRINE 40 MCG/ML (10ML) SYRINGE FOR IV PUSH (FOR BLOOD PRESSURE SUPPORT)
80.0000 ug | PREFILLED_SYRINGE | INTRAVENOUS | Status: DC | PRN
  Filled 2011-09-23 (×2): qty 5

## 2011-09-23 MED ORDER — ACETAMINOPHEN 325 MG PO TABS: 650.0000 mg | ORAL_TABLET | ORAL | Status: DC | PRN

## 2011-09-23 MED ORDER — LIDOCAINE HCL 1.5 % IJ SOLN
INTRAMUSCULAR | Status: DC | PRN
  Administered 2011-09-23 (×2): 5 mL via INTRADERMAL

## 2011-09-23 MED ORDER — CITRIC ACID-SODIUM CITRATE 334-500 MG/5ML PO SOLN: 30.0000 mL | ORAL | Status: DC | PRN

## 2011-09-23 MED ORDER — IBUPROFEN 600 MG PO TABS
600.0000 mg | ORAL_TABLET | Freq: Four times a day (QID) | ORAL | Status: DC | PRN
  Administered 2011-09-24: 600 mg via ORAL
  Filled 2011-09-23: qty 1

## 2011-09-23 MED ORDER — EPHEDRINE 5 MG/ML INJ
10.0000 mg | INTRAVENOUS | Status: DC | PRN
  Filled 2011-09-23 (×2): qty 4

## 2011-09-23 MED ORDER — DIPHENHYDRAMINE HCL 50 MG/ML IJ SOLN: 12.5000 mg | INTRAMUSCULAR | Status: DC | PRN

## 2011-09-23 MED ORDER — EPHEDRINE 5 MG/ML INJ
10.0000 mg | INTRAVENOUS | Status: DC | PRN
  Filled 2011-09-23: qty 4

## 2011-09-23 MED ORDER — LACTATED RINGERS IV SOLN
INTRAVENOUS | Status: DC
  Administered 2011-09-23 (×3): via INTRAVENOUS

## 2011-09-23 MED ORDER — FENTANYL 2.5 MCG/ML BUPIVACAINE 1/10 % EPIDURAL INFUSION (WH - ANES)
14.0000 mL/h | INTRAMUSCULAR | Status: DC
  Administered 2011-09-23 – 2011-09-24 (×4): 14 mL/h via EPIDURAL
  Filled 2011-09-23 (×5): qty 60

## 2011-09-23 MED ORDER — OXYCODONE-ACETAMINOPHEN 5-325 MG PO TABS: 2.0000 | ORAL_TABLET | ORAL | Status: DC | PRN

## 2011-09-23 MED ORDER — LIDOCAINE HCL (PF) 1 % IJ SOLN
30.0000 mL | INTRAMUSCULAR | Status: AC | PRN
  Administered 2011-09-24: 30 mL via SUBCUTANEOUS
  Filled 2011-09-23: qty 30

## 2011-09-23 MED ORDER — TERBUTALINE SULFATE 1 MG/ML IJ SOLN: 0.2500 mg | Freq: Once | INTRAMUSCULAR | Status: AC | PRN

## 2011-09-23 NOTE — Progress Notes (Signed)
Feeling some ctx Afeb, Vss FHT- Cat I, irreg ctx VE-2/80/-1, vtx, AROM clear Will continue pitocin and monitor progress

## 2011-09-23 NOTE — Progress Notes (Signed)
Comfortable with epidural Afeb, VSS FHT- Cat I, ctx q 2-3 min VE- 4/90/0, vtx Continue pitocin, monitor progress

## 2011-09-23 NOTE — H&P (Signed)
Krystal Humphrey is a 25 y.o. female, G2 P0010, EGA 40+ weeks presenting for elective induction.  Prenatal care essentially uncomplicated, see prenatal records for complete history.  Maternal Medical History:  Fetal activity: Perceived fetal activity is normal.    Prenatal complications: no prenatal complications   OB History    Grav Para Term Preterm Abortions TAB SAB Ect Mult Living   2    1  1    0     Past Medical History  Diagnosis Date  . No pertinent past medical history   . Anxiety   . GERD (gastroesophageal reflux disease)   . Depression   . Panic attack    Past Surgical History  Procedure Date  . Tonsillectomy   . Knee surgery   . Knee arthroscopy     R knee   Family History: family history includes COPD in her maternal grandmother; Diabetes in her maternal aunt, maternal uncle, and mother; Heart attack in her father; and Thyroid disease in her mother.  There is no history of Anesthesia problems, and Hypotension, and Malignant hyperthermia, and Pseudochol deficiency, . Social History:  reports that she quit smoking about 3 years ago. She has never used smokeless tobacco. She reports that she does not drink alcohol or use illicit drugs.  Review of Systems  Respiratory: Negative.   Cardiovascular: Negative.     Dilation: 1.5 Effacement (%): 70 Station: -2 Exam by:: Dr Jackelyn Knife Blood pressure 142/76, pulse 93, temperature 98.1 F (36.7 C), temperature source Oral, resp. rate 18, height 5\' 8"  (1.727 m), weight 101.152 kg (223 lb). Maternal Exam:  Abdomen: Patient reports no abdominal tenderness. Estimated fetal weight is 8 lbs.   Fetal presentation: vertex  Introitus: Normal vulva. Normal vagina.    Fetal Exam Fetal Monitor Review: Mode: ultrasound.   Baseline rate: 130s.  Variability: moderate (6-25 bpm).   Pattern: accelerations present and no decelerations.    Fetal State Assessment: Category I - tracings are normal.     Physical Exam    Constitutional: She appears well-developed and well-nourished.  Neck: Neck supple. No thyromegaly present.  Cardiovascular: Normal rate, regular rhythm and normal heart sounds.   No murmur heard. Respiratory: Breath sounds normal. No respiratory distress. She has no wheezes.  GI: Soft.    Prenatal labs: ABO, Rh: A/Positive/-- (03/23 0000) Antibody: Negative (03/23 0000) Rubella: Immune (03/23 0000) RPR: Nonreactive (03/23 0000)  HBsAg: Negative (03/23 0000)  HIV: Non-reactive (03/23 0000)  GBS: Negative (09/21 0000)   Assessment/Plan: IUP at 40+ weeks for induction.  Will start pitocin and monitor progress, AROM when possible.   Krystal Humphrey D 09/23/2011, 8:34 AM

## 2011-09-23 NOTE — Progress Notes (Signed)
FHT reviewed in its entirety by Dr Jackelyn Knife  at this time.  Plan of care discussed with pt at this time.  Pt's questions answered in full and to her verbalized understanding.  Pt verbalizes understanding of and agreement with her care plan.

## 2011-09-23 NOTE — Anesthesia Preprocedure Evaluation (Signed)
Anesthesia Evaluation  General Assessment Comment  Airway Mallampati: III      Dental   Pulmonary          Cardiovascular     Neuro/Psych PSYCHIATRIC DISORDERS    GI/Hepatic PUD GERD   Endo/Other  Morbid obesity  Renal/GU      Musculoskeletal   Abdominal   Peds  Hematology   Anesthesia Other Findings   Reproductive/Obstetrics                           Anesthesia Physical Anesthesia Plan  ASA: III  Anesthesia Plan: Epidural   Post-op Pain Management:    Induction:   Airway Management Planned:   Additional Equipment:   Intra-op Plan:   Post-operative Plan:   Informed Consent:   Plan Discussed with:   Anesthesia Plan Comments:         Anesthesia Quick Evaluation

## 2011-09-23 NOTE — Anesthesia Procedure Notes (Signed)

## 2011-09-24 ENCOUNTER — Encounter (HOSPITAL_COMMUNITY): Payer: Self-pay

## 2011-09-24 MED ORDER — WITCH HAZEL-GLYCERIN EX PADS: 1.0000 "application " | MEDICATED_PAD | CUTANEOUS | Status: DC | PRN

## 2011-09-24 MED ORDER — OXYCODONE-ACETAMINOPHEN 5-325 MG PO TABS
1.0000 | ORAL_TABLET | ORAL | Status: DC | PRN
  Administered 2011-09-24 – 2011-09-25 (×6): 1 via ORAL
  Filled 2011-09-24 (×4): qty 1
  Filled 2011-09-24: qty 2
  Filled 2011-09-24: qty 1

## 2011-09-24 MED ORDER — SENNOSIDES-DOCUSATE SODIUM 8.6-50 MG PO TABS
2.0000 | ORAL_TABLET | Freq: Every day | ORAL | Status: DC
  Administered 2011-09-24 – 2011-09-25 (×2): 2 via ORAL

## 2011-09-24 MED ORDER — BENZOCAINE-MENTHOL 20-0.5 % EX AERO
1.0000 "application " | INHALATION_SPRAY | CUTANEOUS | Status: DC | PRN
  Administered 2011-09-24 – 2011-09-25 (×2): 1 via TOPICAL

## 2011-09-24 MED ORDER — METHYLERGONOVINE MALEATE 0.2 MG PO TABS: 0.2000 mg | ORAL_TABLET | ORAL | Status: DC | PRN

## 2011-09-24 MED ORDER — LANOLIN HYDROUS EX OINT: TOPICAL_OINTMENT | CUTANEOUS | Status: DC | PRN

## 2011-09-24 MED ORDER — MEASLES, MUMPS & RUBELLA VAC ~~LOC~~ INJ
0.5000 mL | INJECTION | Freq: Once | SUBCUTANEOUS | Status: DC
  Filled 2011-09-24: qty 0.5

## 2011-09-24 MED ORDER — ONDANSETRON HCL 4 MG PO TABS: 4.0000 mg | ORAL_TABLET | ORAL | Status: DC | PRN

## 2011-09-24 MED ORDER — METHYLERGONOVINE MALEATE 0.2 MG/ML IJ SOLN
INTRAMUSCULAR | Status: AC
  Administered 2011-09-24: 0.2 mg
  Filled 2011-09-24: qty 1

## 2011-09-24 MED ORDER — OXYTOCIN 20 UNITS IN LACTATED RINGERS INFUSION - SIMPLE: 125.0000 mL/h | INTRAVENOUS | Status: DC | PRN

## 2011-09-24 MED ORDER — DIPHENHYDRAMINE HCL 25 MG PO CAPS: 25.0000 mg | ORAL_CAPSULE | Freq: Four times a day (QID) | ORAL | Status: DC | PRN

## 2011-09-24 MED ORDER — ZOLPIDEM TARTRATE 5 MG PO TABS: 5.0000 mg | ORAL_TABLET | Freq: Every evening | ORAL | Status: DC | PRN

## 2011-09-24 MED ORDER — BENZOCAINE-MENTHOL 20-0.5 % EX AERO
INHALATION_SPRAY | CUTANEOUS | Status: AC
  Administered 2011-09-24: 1 via TOPICAL
  Filled 2011-09-24: qty 56

## 2011-09-24 MED ORDER — TETANUS-DIPHTH-ACELL PERTUSSIS 5-2.5-18.5 LF-MCG/0.5 IM SUSP
0.5000 mL | Freq: Once | INTRAMUSCULAR | Status: AC
  Administered 2011-09-26: 0.5 mL via INTRAMUSCULAR
  Filled 2011-09-24: qty 0.5

## 2011-09-24 MED ORDER — DIBUCAINE 1 % RE OINT: 1.0000 "application " | TOPICAL_OINTMENT | RECTAL | Status: DC | PRN

## 2011-09-24 MED ORDER — PRENATAL PLUS 27-1 MG PO TABS
1.0000 | ORAL_TABLET | Freq: Every day | ORAL | Status: DC
  Administered 2011-09-24 – 2011-09-26 (×3): 1 via ORAL
  Filled 2011-09-24 (×3): qty 1

## 2011-09-24 MED ORDER — METHYLERGONOVINE MALEATE 0.2 MG/ML IJ SOLN: 0.2000 mg | INTRAMUSCULAR | Status: DC | PRN

## 2011-09-24 MED ORDER — SIMETHICONE 80 MG PO CHEW: 80.0000 mg | CHEWABLE_TABLET | ORAL | Status: DC | PRN

## 2011-09-24 MED ORDER — ONDANSETRON HCL 4 MG/2ML IJ SOLN: 4.0000 mg | INTRAMUSCULAR | Status: DC | PRN

## 2011-09-24 MED ORDER — IBUPROFEN 600 MG PO TABS
600.0000 mg | ORAL_TABLET | Freq: Four times a day (QID) | ORAL | Status: DC
  Administered 2011-09-24 – 2011-09-26 (×9): 600 mg via ORAL
  Filled 2011-09-24 (×9): qty 1

## 2011-09-24 MED ORDER — MAGNESIUM HYDROXIDE 400 MG/5ML PO SUSP: 30.0000 mL | ORAL | Status: DC | PRN

## 2011-09-24 NOTE — Progress Notes (Signed)
RN at bedside continually adjusting EFM. Fetal movement heard.

## 2011-09-24 NOTE — Progress Notes (Signed)
UC's not tracing well. Patient in left exaggerated sims. RN continually readjusting TOCO and palpating contractions. Palpating every 2-3 minutes

## 2011-09-24 NOTE — Progress Notes (Signed)
UR chart review completed.  

## 2011-09-24 NOTE — Progress Notes (Signed)
Updated on patient status.

## 2011-09-25 MED ORDER — BENZOCAINE-MENTHOL 20-0.5 % EX AERO
INHALATION_SPRAY | CUTANEOUS | Status: AC
  Administered 2011-09-25: 1 via TOPICAL
  Filled 2011-09-25: qty 56

## 2011-09-25 NOTE — Progress Notes (Signed)
PSYCHOSOCIAL ASSESSMENT ~ MATERNAL/CHILD  Name: Krystal Humphrey Baby: Krystal Humphrey Age: 25  Referral Date: 09/25/11 Reason/Source: CN-hx of anxiety and depression  I. FAMILY/HOME ENVIRONMENT  A. Child's Legal Guardian  _X__Parent(s) ___Grandparent ___Foster parent ___DSS_________________  Name: Krystal Humphrey DOB: 10/08/1986 Age: 25  Address: [address] [place], [place] [address]  Name: Krystal Humphrey DOB___/____/____ Age_____  Address: Same as above  B. Other Household Members/Support Persons  Name_____________________Relationship____________ DOB ___/___/___  Name_____________________Relationship____________ DOB ___/___/___  Name_____________________Relationship____________ DOB ___/___/___  Name_____________________Relationship____________ DOB ___/___/___  C. Other Support: "I have really close friends"  II. PSYCHOSOCIAL DATA  A. Information Source _X_Patient Interview _X_Family Interview _X_Other: Chart Review  B. Financial and Community Resources  __Employment __________________________________________________  _X_Medicaid County: [place] __Private Insurance_________ __Self Pay  __Food Stamps _X_WIC __Work First __Public Housing __Section 8  __Maternity Care Coordination/Child Service Coordination/Early Intervention _________________________________________________________________School _____________________________________Grade____________  _X_Other: [employer]  C. Cultural and Environment Information  Cultural Issues Impacting Care: None reported  III. STRENGTHS  _X__Supportive family/friends  _X__Adequate Resources  _X__Compliance with medical plan  _X__Home prepared for Child (including basic supplies)  _X__Understanding of illness  ___Other__________________________________________________________  IV. RISK FACTORS AND CURRENT PROBLEMS  ____No Problems Noted Pt Family  Substance Abuse ___ ___  Mental Illness: Hx of depression and anxiety  Family/Relationship  Issues ___ ___  Abuse/Neglect/Domestic Violence ___ ___  Financial Resources ___ ___  Transportation ___ ___  DSS Involvement ___ ___  Adjustment to Illness ___ ___  Knowledge/Cognitive Deficit ___ ___  Compliance with Treatment ___ ___  Basic Needs (food, housing, etc.) ___ ___  Housing Concerns ___ ___  Other_____________________________________________________________  V. SOCIAL WORK ASSESSMENT  CSW met with MOB and FOB at bedside. MOB was appropriate with baby and was breastfeeding during assessment. CSW spoke with bedside RN who reported MOB has been appropriate and engaging with baby. CSW spoke with MOB regarding her adjustment to baby's arrival. MOB stated that she was prepared for baby and has all necessary supplies such as clothes, diapers, crib, carseat, etc. MOB reports that her family has been adding additional stress for her but that her friends have been supportive during this time. FOB lives with MOB and will help support baby. CSW spoke with MOB regarding history of depression and anxiety. MOB states she had a panic attack about a year ago and went to the ED. ED prescribed her Remeron but MOB reports this made her chest feel tight and she stopped taking medication. MOB has not had any treatment since then. MOB reports she has felt depressed during pregnancy because of "family drama" and because FOB travels and is away from home a lot. MOB reports she feels comfortable discussing any symptoms with OBGYN. CSW provided MOB with brochure and discussed the differences between baby blues and PPD. MOB reports she feels that she is adjusting well to birth and can rely on her support system if any needs arise. MOB reports no further concerns at this time.  VI. SOCIAL WORK PLAN  _X__No Further Intervention Required/No Barriers to Discharge  ___Psychosocial Support and Ongoing Assessment of Needs  _X__Patient/Family Education:"Feelings After Birth" brochure  ___Child Protective Services Report  County___________ Date___/____/____  ___Information/Referral to Community Resources_________________________  ___Other__________________________________________________________  

## 2011-09-25 NOTE — Progress Notes (Signed)
#  1 afebrile BP normal no complaints except soreness.

## 2011-09-26 MED ORDER — IBUPROFEN 600 MG PO TABS: 600.0000 mg | ORAL_TABLET | Freq: Four times a day (QID) | ORAL | Status: DC

## 2011-09-26 MED ORDER — OXYCODONE-ACETAMINOPHEN 5-325 MG PO TABS: 1.0000 | ORAL_TABLET | Freq: Four times a day (QID) | ORAL | Status: DC | PRN

## 2011-09-26 MED ORDER — INFLUENZA VIRUS VACC SPLIT PF IM SUSP
0.5000 mL | Freq: Once | INTRAMUSCULAR | Status: AC
  Administered 2011-09-26: 0.5 mL via INTRAMUSCULAR
  Filled 2011-09-26: qty 0.5

## 2011-09-26 NOTE — Discharge Summary (Signed)
NAMEALIXANDRA, Krystal Humphrey            ACCOUNT NO.:  [id number]  MEDICAL RECORD NO.:  [id number]  LOCATION:  9121                          FACILITY:  [facility]  PHYSICIAN:  Malachi Pro. Ambrose Mantle, M.D. DATE OF BIRTH:  01/20/1986  DATE OF ADMISSION:  09/23/2011 DATE OF DISCHARGE:  09/26/2011                              DISCHARGE SUMMARY   This is a 25 year old white female, para 0-0-1-0, gravida 2 at 40+ weeks gestation, presented for elective induction.  Prenatal care was essentially uncomplicated.  Blood group and type A positive, negative antibody, rubella immune, RPR nonreactive, hepatitis B surface antigen negative, HIV negative, group B strep was negative.  The patient's past medical history revealed: 1. History of anxiety. 2. Gastroesophageal reflux disease. 3. Depression. 4. Panic attacks.  SURGICAL HISTORY: 1. Tonsillectomy. 2. Knee surgery. 3. Knee arthroscopy.  FAMILY HISTORY:  Her father had a heart attack.  Mother has thyroid disease.  No close family history of other medical illnesses.  SOCIAL HISTORY:  The patient reported quitting smoking 3 years ago, does not drink or use illicit drugs.  PHYSICAL EXAMINATION:  VITAL SIGNS:  On admission, her blood pressure was 142/76, pulse 93, temperature 98.1. HEART:  Normal. LUNGS:  Normal. ABDOMEN:  Soft.  Fetal heart tones were normal.  The patient was started on Pitocin.  By 1:30 p.m., the patient was 2 cm, 80%, vertex was at -1.  Rupture of membranes produced clear fluid.  The patient received an epidural and at 7:16 p.m., the cervix was 4 cm, 90%, vertex at a 0 station.  At 5:06 p.m. on September 24, 2011, the patient had progressed to complete dilatation and pushed well, and at 4:38 a.m., a healthy female infant was delivered vaginally by Dr. Jackelyn Knife. Apgars were 8 and 9.  The baby weighed 9 pounds 6-1/2 ounces.  Placenta was spontaneous and intact.  A small median episiotomy due to restrictive band holding up delivery was  done and was repaired with 3-0 Vicryl.  Blood loss about 600 mL.  Postpartum, the patient did well. She had no particular problems and on the second postpartum day, she was discharged.  Initial hemoglobin was 13.9, hematocrit 40.8, white count 11,400, platelet count 248,000, normal differential that was done in January 2012.  Hemoglobin on admission was 11.6, hematocrit 34.1, white count 15,100, platelet count 211,000.  Postpartum hemoglobin was not done.  The patient had no excessive bleeding.  FINAL DIAGNOSIS:  Intrauterine pregnancy at 40+ weeks, delivered vertex.  OPERATION:  Spontaneous delivery, vertex.  Midline episiotomy and repair.  FINAL CONDITION:  Improved.  INSTRUCTIONS:  Include our regular discharge instruction booklet.  The patient does request a small amount of pain medication Motrin 600 mg, 15 tablets 1 every 6 hours as needed for pain and Percocet 5/325 #15 tablets, 1 every 4-6 hours as needed for pain.  The patient is advised to return to the office in 6 weeks for followup examination.  She will receive the fluid and the TDaP vaccines prior to discharge.     Malachi Pro. Ambrose Mantle, M.D.     TFH/MEDQ  D:  09/26/2011  T:  09/26/2011  Job:  [id number]

## 2011-09-26 NOTE — Discharge Summary (Signed)
#  2 afebrile no problems for discharge

## 2011-09-29 NOTE — Anesthesia Postprocedure Evaluation (Signed)
Patient stable following vaginal delivery.  

## 2011-09-30 ENCOUNTER — Ambulatory Visit (HOSPITAL_COMMUNITY): Admission: RE | Admit: 2011-09-30 | Payer: Medicaid Other | Source: Ambulatory Visit

## 2011-10-04 ENCOUNTER — Encounter (HOSPITAL_COMMUNITY): Payer: Self-pay | Admitting: *Deleted

## 2011-10-04 ENCOUNTER — Inpatient Hospital Stay (HOSPITAL_COMMUNITY): Payer: Medicaid Other

## 2011-10-04 ENCOUNTER — Inpatient Hospital Stay (HOSPITAL_COMMUNITY)
Admission: AD | Admit: 2011-10-04 | Discharge: 2011-10-04 | Disposition: A | Discharge status: Home / Self Care | Payer: Medicaid Other | Source: Ambulatory Visit | Attending: Obstetrics and Gynecology | Admitting: Obstetrics and Gynecology

## 2011-10-04 DIAGNOSIS — R1031 Right lower quadrant pain: Secondary | ICD-10-CM

## 2011-10-04 LAB — URINALYSIS, ROUTINE W REFLEX MICROSCOPIC
Bilirubin Urine: NEGATIVE
Ketones, ur: NEGATIVE mg/dL
Nitrite: NEGATIVE
pH: 7.5 (ref 5.0–8.0)

## 2011-10-04 LAB — COMPREHENSIVE METABOLIC PANEL
ALT: 27 U/L (ref 0–35)
AST: 20 U/L (ref 0–37)
Alkaline Phosphatase: 132 U/L — ABNORMAL HIGH (ref 39–117)
CO2: 25 mEq/L (ref 19–32)
GFR calc Af Amer: >90 mL/min (ref >90)
GFR calc non Af Amer: >90 mL/min (ref >90)
Glucose, Bld: 81 mg/dL (ref 70–99)
Potassium: 3.8 mEq/L (ref 3.5–5.1)
Sodium: 135 mEq/L (ref 135–145)
Total Protein: 7.5 g/dL (ref 6.0–8.3)

## 2011-10-04 LAB — DIFFERENTIAL
Basophils Absolute: 0 10*3/uL (ref 0.0–0.1)
Lymphocytes Relative: 9 % — ABNORMAL LOW (ref 12–46)
Lymphs Abs: 1.4 10*3/uL (ref 0.7–4.0)
Neutrophils Relative %: 86 % — ABNORMAL HIGH (ref 43–77)

## 2011-10-04 LAB — CBC
Platelets: 382 10*3/uL (ref 150–400)
RBC: 3.97 MIL/uL (ref 3.87–5.11)
WBC: 15 10*3/uL — ABNORMAL HIGH (ref 4.0–10.5)

## 2011-10-04 LAB — URINE MICROSCOPIC-ADD ON

## 2011-10-04 MED ORDER — IOHEXOL 300 MG/ML  SOLN
100.0000 mL | Freq: Once | INTRAMUSCULAR | Status: AC | PRN
  Administered 2011-10-04: 100 mL via INTRAVENOUS

## 2011-10-04 MED ORDER — SODIUM CHLORIDE 0.9 % IJ SOLN
INTRAMUSCULAR | Status: AC
  Filled 2011-10-04: qty 6

## 2011-10-04 NOTE — ED Provider Notes (Signed)
History     CSN: [id number] Arrival date & time: 10/04/2011 12:58 PM   None     Chief Complaint  Patient presents with  . Abdominal Pain    HPI Krystal Humphrey is a 25 y.o. female who arrived to [facility] via EMS for low abdominal pain that started this morning. S/P SVD without complications 09/24/11. Patient states she had a BM at 11 am and then had sharp RLQ pain that increased with walking and movement. Complained of nausea but has not vomited. Last ate last night.  Past Medical History  Diagnosis Date  . No pertinent past medical history   . Anxiety   . GERD (gastroesophageal reflux disease)   . Depression   . Panic attack     Past Surgical History  Procedure Date  . Tonsillectomy   . Knee surgery   . Knee arthroscopy     R knee    Family History  Problem Relation Age of Onset  . Diabetes Mother   . Thyroid disease Mother   . Heart attack Father   . Diabetes Maternal Aunt   . Diabetes Maternal Uncle   . COPD Maternal Grandmother   . Anesthesia problems Neg Hx   . Hypotension Neg Hx   . Malignant hyperthermia Neg Hx   . Pseudochol deficiency Neg Hx     History  Substance Use Topics  . Smoking status: Former Smoker    Quit date: 09/20/2008  . Smokeless tobacco: Never Used  . Alcohol Use: No    OB History    Grav Para Term Preterm Abortions TAB SAB Ect Mult Living   2 1 1  1  1   1       Review of Systems  Constitutional: Positive for fever and chills.  Eyes: Negative.   Cardiovascular: Negative.   Gastrointestinal: Positive for nausea and abdominal pain. Negative for vomiting, diarrhea and constipation.  Genitourinary: Positive for vaginal bleeding and pelvic pain. Negative for dysuria.  Musculoskeletal: Positive for back pain.  Skin: Negative.   Neurological: Positive for headaches.    Allergies  Remeron and Metronidazole  Home Medications  No current outpatient prescriptions on file.  BP 131/67  Pulse 82  Temp(Src) 98.1 F (36.7 C) (Oral)   Resp 20  Ht 5\' 8"  (1.727 m)  Breastfeeding? No  Physical Exam  Nursing note and vitals reviewed. Constitutional: She is oriented to person, place, and time. She appears well-developed and well-nourished. No distress.  HENT:  Head: Normocephalic.  Eyes: EOM are normal.  Neck: Neck supple.  Cardiovascular: Normal rate.   Pulmonary/Chest: Effort normal.  Abdominal: Soft. There is tenderness in the right lower quadrant and suprapubic area. There is no rebound and no guarding.  Musculoskeletal: Normal range of motion.  Neurological: She is alert and oriented to person, place, and time. No cranial nerve deficit.  Skin: Skin is warm and dry.  Psychiatric: She has a normal mood and affect. Thought content normal.    ED Course  Procedures  Results for orders placed during the hospital encounter of 10/04/11 (from the past 24 hour(s))  CBC     Status: Abnormal   Collection Time   10/04/11  1:51 PM      Component Value Range   WBC 15.0 (*) 4.0 - 10.5 (K/uL)   RBC 3.97  3.87 - 5.11 (MIL/uL)   Hemoglobin 12.0  12.0 - 15.0 (g/dL)   HCT 40.9  81.1 - 91.4 (%)   MCV 91.2  78.0 -  100.0 (fL)   MCH 30.2  26.0 - 34.0 (pg)   MCHC 33.1  30.0 - 36.0 (g/dL)   RDW 16.1  09.6 - 04.5 (%)   Platelets 382  150 - 400 (K/uL)  DIFFERENTIAL     Status: Abnormal   Collection Time   10/04/11  1:51 PM      Component Value Range   Neutrophils Relative 86 (*) 43 - 77 (%)   Neutro Abs 12.9 (*) 1.7 - 7.7 (K/uL)   Lymphocytes Relative 9 (*) 12 - 46 (%)   Lymphs Abs 1.4  0.7 - 4.0 (K/uL)   Monocytes Relative 4  3 - 12 (%)   Monocytes Absolute 0.6  0.1 - 1.0 (K/uL)   Eosinophils Relative 0  0 - 5 (%)   Eosinophils Absolute 0.1  0.0 - 0.7 (K/uL)   Basophils Relative 0  0 - 1 (%)   Basophils Absolute 0.0  0.0 - 0.1 (K/uL)  COMPREHENSIVE METABOLIC PANEL     Status: Abnormal   Collection Time   10/04/11  1:51 PM      Component Value Range   Sodium 135  135 - 145 (mEq/L)   Potassium 3.8  3.5 - 5.1 (mEq/L)    Chloride 100  96 - 112 (mEq/L)   CO2 25  19 - 32 (mEq/L)   Glucose, Bld 81  70 - 99 (mg/dL)   BUN 13  6 - 23 (mg/dL)   Creatinine, Ser 4.09  0.50 - 1.10 (mg/dL)   Calcium 9.8  8.4 - 81.1 (mg/dL)   Total Protein 7.5  6.0 - 8.3 (g/dL)   Albumin 3.4 (*) 3.5 - 5.2 (g/dL)   AST 20  0 - 37 (U/L)   ALT 27  0 - 35 (U/L)   Alkaline Phosphatase 132 (*) 39 - 117 (U/L)   Total Bilirubin 0.4  0.3 - 1.2 (mg/dL)   GFR calc non Af Amer >90  >90 (mL/min)   GFR calc Af Amer >90  >90 (mL/min)   US Transvaginal Non-ob  10/04/2011  *RADIOLOGY REPORT*  Clinical Data: Status post vaginal delivery.  Pelvic pain. Bleeding.  Delivery was on 09/24/2011.  TRANSABDOMINAL AND TRANSVAGINAL ULTRASOUND OF PELVIS Technique:  Both transabdominal and transvaginal ultrasound examinations of the pelvis were performed. Transabdominal technique was performed for global imaging of the pelvis including uterus, ovaries, adnexal regions, and pelvic cul-de-sac.  Comparison: None   It was necessary to proceed with endovaginal exam following the transabdominal exam to visualize the uterus, endometrium, and ovaries.  Findings:  Uterus: The uterus is enlarged, 13.7 x 7.0 x 8.2 cm.  No uterine mass identified.  Endometrium: The endometrium is thickened.  The endometrial canal appears heterogeneous, measuring 14 - 16 mm. Hyper echoic focus is identified within the cervical canal, consistent with air. Alternatively, echogenic debris could have a similar appearance.  Right ovary:  The right ovary is 2.8 x 2.2 x 1.5 cm and has a normal appearance.  Left ovary: The left ovary is 2.0 x 1.3 x 1.7 cm and has a normal appearance.  Other findings: No free fluid  IMPRESSION:  1.  Thickened, heterogeneous endometrial canal, consistent with retained products of conception versus clot.  A small, hyper echoic focus within the cervical canal suggests air.  Endometritis is not excluded. 2.  Normal-appearing ovaries.  .  Original Report Authenticated By: Patterson Hammersmith, M.D.   US Pelvis Complete  10/04/2011  *RADIOLOGY REPORT*  Clinical Data: Status post vaginal delivery.  Pelvic pain. Bleeding.  Delivery was on 09/24/2011.  TRANSABDOMINAL AND TRANSVAGINAL ULTRASOUND OF PELVIS Technique:  Both transabdominal and transvaginal ultrasound examinations of the pelvis were performed. Transabdominal technique was performed for global imaging of the pelvis including uterus, ovaries, adnexal regions, and pelvic cul-de-sac.  Comparison: None   It was necessary to proceed with endovaginal exam following the transabdominal exam to visualize the uterus, endometrium, and ovaries.  Findings:  Uterus: The uterus is enlarged, 13.7 x 7.0 x 8.2 cm.  No uterine mass identified.  Endometrium: The endometrium is thickened.  The endometrial canal appears heterogeneous, measuring 14 - 16 mm. Hyper echoic focus is identified within the cervical canal, consistent with air. Alternatively, echogenic debris could have a similar appearance.  Right ovary:  The right ovary is 2.8 x 2.2 x 1.5 cm and has a normal appearance.  Left ovary: The left ovary is 2.0 x 1.3 x 1.7 cm and has a normal appearance.  Other findings: No free fluid  IMPRESSION:  1.  Thickened, heterogeneous endometrial canal, consistent with retained products of conception versus clot.  A small, hyper echoic focus within the cervical canal suggests air.  Endometritis is not excluded. 2.  Normal-appearing ovaries.  .  Original Report Authenticated By: Patterson Hammersmith, M.D.   Consult with Dr. Ambrose Mantle, will d/c patient home with instructions on abdominal pain and reasons to return.   Assessment: Abdominal pain   Plan:  Discussed in detail with patient results and plan of care   She will return if she develops fever > 100.4, has persistent vomiting, increased pain or other problems.   MDM          Kerrie Buffalo, NP 10/04/11 [id number]

## 2011-10-04 NOTE — Progress Notes (Addendum)
Pt arrived by EMS. Pt had a normal SVD about 10 days ago. Pt states about 11am had a BM then started having sharp, intermittent RLQ pain. Pt states it hurts more when she walks. Pt states she feels nauseated has not eaten or drank anything since last night. Pt has not taken anything for pain.

## 2011-10-05 ENCOUNTER — Encounter (HOSPITAL_COMMUNITY): Payer: Medicaid Other

## 2011-11-29 ENCOUNTER — Encounter (HOSPITAL_COMMUNITY): Payer: Self-pay | Admitting: *Deleted

## 2011-11-29 ENCOUNTER — Emergency Department (HOSPITAL_COMMUNITY)
Admission: EM | Admit: 2011-11-29 | Discharge: 2011-11-29 | Discharge status: Against Medical Advice | Payer: Medicaid Other | Attending: Emergency Medicine | Admitting: Emergency Medicine

## 2011-11-29 ENCOUNTER — Emergency Department (HOSPITAL_COMMUNITY)
Admission: EM | Admit: 2011-11-29 | Discharge: 2011-11-29 | Disposition: A | Discharge status: Home / Self Care | Payer: Medicaid Other | Source: Home / Self Care

## 2011-11-29 DIAGNOSIS — M79609 Pain in unspecified limb: Secondary | ICD-10-CM | POA: Insufficient documentation

## 2011-11-29 NOTE — ED Notes (Signed)
Reports left foot and ankle pain x 4 days, unsure if she injured her foot. ambualtory at triage.

## 2011-11-29 NOTE — ED Notes (Signed)
Pt is not in triage or general waiting when called.

## 2011-11-29 NOTE — ED Notes (Signed)
Pt transferred to waiting room, no answer when called.

## 2011-11-29 NOTE — ED Notes (Signed)
Did not answer when called 

## 2012-01-28 ENCOUNTER — Encounter: Payer: Medicaid Other | Admitting: Internal Medicine

## 2013-04-02 ENCOUNTER — Ambulatory Visit: Payer: Medicaid Other | Admitting: Physical Therapy

## 2013-04-11 ENCOUNTER — Ambulatory Visit: Payer: Medicaid Other | Attending: Family Medicine | Admitting: Physical Therapy

## 2014-08-10 ENCOUNTER — Encounter (HOSPITAL_BASED_OUTPATIENT_CLINIC_OR_DEPARTMENT_OTHER): Payer: Self-pay | Admitting: Emergency Medicine

## 2014-08-10 ENCOUNTER — Emergency Department (HOSPITAL_BASED_OUTPATIENT_CLINIC_OR_DEPARTMENT_OTHER)
Admission: EM | Admit: 2014-08-10 | Discharge: 2014-08-10 | Disposition: A | Discharge status: Home / Self Care | Payer: Worker's Compensation | Attending: Emergency Medicine | Admitting: Emergency Medicine

## 2014-08-10 DIAGNOSIS — Z79899 Other long term (current) drug therapy: Secondary | ICD-10-CM | POA: Insufficient documentation

## 2014-08-10 DIAGNOSIS — Y9289 Other specified places as the place of occurrence of the external cause: Secondary | ICD-10-CM | POA: Insufficient documentation

## 2014-08-10 DIAGNOSIS — Z8659 Personal history of other mental and behavioral disorders: Secondary | ICD-10-CM | POA: Diagnosis not present

## 2014-08-10 DIAGNOSIS — IMO0002 Reserved for concepts with insufficient information to code with codable children: Secondary | ICD-10-CM | POA: Diagnosis not present

## 2014-08-10 DIAGNOSIS — Y9389 Activity, other specified: Secondary | ICD-10-CM | POA: Diagnosis not present

## 2014-08-10 DIAGNOSIS — S0003XA Contusion of scalp, initial encounter: Secondary | ICD-10-CM | POA: Insufficient documentation

## 2014-08-10 DIAGNOSIS — S0993XA Unspecified injury of face, initial encounter: Secondary | ICD-10-CM | POA: Insufficient documentation

## 2014-08-10 DIAGNOSIS — S0083XA Contusion of other part of head, initial encounter: Secondary | ICD-10-CM | POA: Insufficient documentation

## 2014-08-10 DIAGNOSIS — S199XXA Unspecified injury of neck, initial encounter: Secondary | ICD-10-CM | POA: Diagnosis present

## 2014-08-10 DIAGNOSIS — S0033XA Contusion of nose, initial encounter: Secondary | ICD-10-CM

## 2014-08-10 DIAGNOSIS — Z8719 Personal history of other diseases of the digestive system: Secondary | ICD-10-CM | POA: Insufficient documentation

## 2014-08-10 DIAGNOSIS — S1093XA Contusion of unspecified part of neck, initial encounter: Principal | ICD-10-CM

## 2014-08-10 DIAGNOSIS — Z87891 Personal history of nicotine dependence: Secondary | ICD-10-CM | POA: Insufficient documentation

## 2014-08-10 MED ORDER — HYDROCODONE-ACETAMINOPHEN 5-325 MG PO TABS: 2.0000 | ORAL_TABLET | ORAL | Status: DC | PRN

## 2014-08-10 MED ORDER — HYDROCODONE-ACETAMINOPHEN 5-325 MG PO TABS
1.0000 | ORAL_TABLET | Freq: Once | ORAL | Status: AC
  Administered 2014-08-10: 1 via ORAL
  Filled 2014-08-10: qty 1

## 2014-08-10 NOTE — ED Notes (Signed)
Pt reports at training for work, woods of terror, got hit by coworker's head into pt's nose. Bruising noted. States she heard it cracked. Bled immediately.

## 2014-08-10 NOTE — Discharge Instructions (Signed)
Facial or Scalp Contusion A facial or scalp contusion is a deep bruise on the face or head. Injuries to the face and head generally cause a lot of swelling, especially around the eyes. Contusions are the result of an injury that caused bleeding under the skin. The contusion may turn blue, purple, or yellow. Minor injuries will give you a painless contusion, but more severe contusions may stay painful and swollen for a few weeks.  CAUSES  A facial or scalp contusion is caused by a blunt injury or trauma to the face or head area.  SIGNS AND SYMPTOMS   Swelling of the injured area.   Discoloration of the injured area.   Tenderness, soreness, or pain in the injured area.  DIAGNOSIS  The diagnosis can be made by taking a medical history and doing a physical exam. An X-ray exam, CT scan, or MRI may be needed to determine if there are any associated injuries, such as broken bones (fractures). TREATMENT  Often, the best treatment for a facial or scalp contusion is applying cold compresses to the injured area. Over-the-counter medicines may also be recommended for pain control.  HOME CARE INSTRUCTIONS   Only take over-the-counter or prescription medicines as directed by your health care provider.   Apply ice to the injured area.   Put ice in a plastic bag.   Place a towel between your skin and the bag.   Leave the ice on for 20 minutes, 2-3 times a day.  SEEK MEDICAL CARE IF:  You have bite problems.   You have pain with chewing.   You are concerned about facial defects. SEEK IMMEDIATE MEDICAL CARE IF:  You have severe pain or a headache that is not relieved by medicine.   You have unusual sleepiness, confusion, or personality changes.   You throw up (vomit).   You have a persistent nosebleed.   You have double vision or blurred vision.   You have fluid drainage from your nose or ear.   You have difficulty walking or using your arms or legs.  MAKE SURE YOU:    Understand these instructions.  Will watch your condition.  Will get help right away if you are not doing well or get worse. Document Released: 12/23/2004 Document Revised: 09/05/2013 Document Reviewed: 06/28/2013 ExitCare Patient Information 2015 [employer]. This information is not intended to replace advice given to you by your health care provider. Make sure you discuss any questions you have with your health care provider.  

## 2014-08-10 NOTE — ED Provider Notes (Signed)
CSN: [id number]     Arrival date & time 08/10/14  2029 History  This chart was scribed for Rolland Porter, MD by Modena Jansky, ED Scribe. This patient was seen in room [facility]1/[facility] and the patient's care was started at 9:42 PM.     Chief Complaint  Patient presents with  . Facial Injury   The history is provided by the patient. No language interpreter was used.   HPI Comments: Krystal Humphrey is a 28 y.o. female who presents to the Emergency Department complaining of nasal pain. She was working at her "training session". She's had a Halloween traction called was a chair. There was a "victim" sitting on the table in front of her. She turned towards him he sat up abruptly. His head struck her on the nose.  She complains of pain. Her nose bled. Is not deformed.  Past Medical History  Diagnosis Date  . No pertinent past medical history   . Anxiety   . GERD (gastroesophageal reflux disease)   . Depression   . Panic attack    Past Surgical History  Procedure Laterality Date  . Tonsillectomy    . Knee surgery    . Knee arthroscopy      R knee   Family History  Problem Relation Age of Onset  . Diabetes Mother   . Thyroid disease Mother   . Heart attack Father   . Diabetes Maternal Aunt   . Diabetes Maternal Uncle   . COPD Maternal Grandmother   . Anesthesia problems Neg Hx   . Hypotension Neg Hx   . Malignant hyperthermia Neg Hx   . Pseudochol deficiency Neg Hx    History  Substance Use Topics  . Smoking status: Former Smoker    Quit date: 09/20/2008  . Smokeless tobacco: Never Used  . Alcohol Use: No   OB History   Grav Para Term Preterm Abortions TAB SAB Ect Mult Living   Review of Systems  Constitutional: Negative for fever, chills, diaphoresis, appetite change and fatigue.  HENT: Positive for nosebleeds. Negative for mouth sores, sore throat and trouble swallowing.        Nasal pain and nosebleed.  Eyes: Negative for visual disturbance.  Respiratory:  Negative for cough, chest tightness, shortness of breath and wheezing.   Cardiovascular: Negative for chest pain.  Gastrointestinal: Negative for nausea, vomiting, abdominal pain, diarrhea and abdominal distention.  Endocrine: Negative for polydipsia, polyphagia and polyuria.  Genitourinary: Negative for dysuria, frequency and hematuria.  Musculoskeletal: Negative for gait problem.  Skin: Negative for color change, pallor and rash.  Neurological: Negative for dizziness, syncope, light-headedness and headaches.  Hematological: Does not bruise/bleed easily.  Psychiatric/Behavioral: Negative for behavioral problems and confusion.    Allergies  Remeron and Metronidazole  Home Medications   Prior to Admission medications   Medication Sig Start Date End Date Taking? Authorizing Provider  HYDROcodone-acetaminophen (NORCO/VICODIN) 5-325 MG per tablet Take 2 tablets by mouth every 4 (four) hours as needed. 08/10/14   Rolland Porter, MD  [place]-Eth [facility]ott Holler 91-Day (SEASONIQUE PO) Take 1 tablet by mouth daily.      Historical Provider, MD   Temp(Src) 98.2 F (36.8 C) (Oral)  Resp 16  SpO2 97% Physical Exam  Nursing note and vitals reviewed. Constitutional: She is oriented to person, place, and time. She appears well-developed and well-nourished. No distress.  HENT:  Head: Normocephalic.    Eyes: Conjunctivae are normal.  Pupils are equal, round, and reactive to light. No scleral icterus.  Neck: Normal range of motion. Neck supple. No thyromegaly present.  Cardiovascular: Normal rate and regular rhythm.  Exam reveals no gallop and no friction rub.   No murmur heard. Pulmonary/Chest: Effort normal and breath sounds normal. No respiratory distress. She has no wheezes. She has no rales.  Abdominal: Soft. Bowel sounds are normal. She exhibits no distension. There is no tenderness. There is no rebound.  Musculoskeletal: Normal range of motion.  Neurological: She is alert and oriented to person,  place, and time.  Skin: Skin is warm and dry. No rash noted.  Psychiatric: She has a normal mood and affect. Her behavior is normal.    ED Course  Procedures (including critical care time) DIAGNOSTIC STUDIES: Oxygen Saturation is 97% on RA, normal by my interpretation.    COORDINATION OF CARE: 9:42 PM- Pt advised of plan for treatment and pt agrees.  Labs Review Labs Reviewed - No data to display  Imaging Review No results found.   EKG Interpretation None      MDM   Final diagnoses:  Facial contusion, initial encounter  Nasal contusion, initial encounter     I personally performed the services described in this documentation, which was scribed in my presence. The recorded information has been reviewed and is accurate.     Rolland Porter, MD 08/10/14 2142

## 2014-09-30 ENCOUNTER — Encounter (HOSPITAL_BASED_OUTPATIENT_CLINIC_OR_DEPARTMENT_OTHER): Payer: Self-pay | Admitting: Emergency Medicine

## 2015-01-07 ENCOUNTER — Encounter: Payer: Self-pay | Admitting: Family

## 2015-01-07 ENCOUNTER — Telehealth: Payer: Self-pay | Admitting: Family

## 2015-01-07 ENCOUNTER — Other Ambulatory Visit (INDEPENDENT_AMBULATORY_CARE_PROVIDER_SITE_OTHER): Payer: BLUE CROSS/BLUE SHIELD

## 2015-01-07 ENCOUNTER — Ambulatory Visit (INDEPENDENT_AMBULATORY_CARE_PROVIDER_SITE_OTHER): Payer: BLUE CROSS/BLUE SHIELD | Admitting: Family

## 2015-01-07 DIAGNOSIS — E669 Obesity, unspecified: Secondary | ICD-10-CM | POA: Insufficient documentation

## 2015-01-07 DIAGNOSIS — R635 Abnormal weight gain: Secondary | ICD-10-CM

## 2015-01-07 LAB — BASIC METABOLIC PANEL
BUN: 12 mg/dL (ref 6–23)
CHLORIDE: 102 meq/L (ref 96–112)
CO2: 29 meq/L (ref 19–32)
CREATININE: 0.76 mg/dL (ref 0.40–1.20)
Calcium: 9.8 mg/dL (ref 8.4–10.5)
GFR: 97.6 mL/min (ref >60.00)
Glucose, Bld: 73 mg/dL (ref 70–99)
Potassium: 4.4 mEq/L (ref 3.5–5.1)
Sodium: 137 mEq/L (ref 135–145)

## 2015-01-07 LAB — TSH: TSH: 1.73 u[IU]/mL (ref 0.35–4.50)

## 2015-01-07 MED ORDER — PHENTERMINE HCL 37.5 MG PO TABS: 37.5000 mg | ORAL_TABLET | Freq: Every day | ORAL | Status: DC

## 2015-01-07 NOTE — Telephone Encounter (Signed)
Please inform the patient that her thyroid, kidney function, and electrolytes are all within the normal expected ranges. Therefore we will continue with the plan of phentermine for 30 days.

## 2015-01-07 NOTE — Progress Notes (Signed)
Pre visit review using our clinic review tool, if applicable. No additional management support is needed unless otherwise documented below in the visit note. 

## 2015-01-07 NOTE — Assessment & Plan Note (Signed)
Discussed importance of nutrient dense diet and increased physical activity. Goal will be to increase nutrient density through increasing fruits and vegetables in her diet while decreasing saturated fats. Goal for exercises to increase physical activity to 30 minutes most days a week starting with walking. Patient wishes to start medication at this time. Start phentermine. Follow up in 1 month to determine progress and behavior change status.

## 2015-01-07 NOTE — Patient Instructions (Signed)
Thank you for choosing [facility].  Summary/Instructions:  Your prescription(s) have been submitted to your pharmacy or been printed and provided for you. Please take as directed and contact our office if you believe you are having problem(s) with the medication(s) or have any questions.  Please stop by the lab on the basement level of the building for your blood work. Your results will be released to MyChart (or called to you) after review, usually within 72 hours after test completion. If any changes need to be made, you will be notified at that same time.  If your symptoms worsen or fail to improve, please contact our office for further instruction, or in case of emergency go directly to the emergency room at the closest medical facility.   Exercise to Lose Weight Exercise and a healthy diet may help you lose weight. Your doctor may suggest specific exercises. EXERCISE IDEAS AND TIPS  Choose low-cost things you enjoy doing, such as walking, bicycling, or exercising to workout videos.  Take stairs instead of the elevator.  Walk during your lunch break.  Park your car further away from work or school.  Go to a gym or an exercise class.  Start with 5 to 10 minutes of exercise each day. Build up to 30 minutes of exercise 4 to 6 days a week.  Wear shoes with good support and comfortable clothes.  Stretch before and after working out.  Work out until you breathe harder and your heart beats faster.  Drink extra water when you exercise.  Do not do so much that you hurt yourself, feel dizzy, or get very short of breath. Exercises that burn about 150 calories:  Running 1  miles in 15 minutes.  Playing volleyball for 45 to 60 minutes.  Washing and waxing a car for 45 to 60 minutes.  Playing touch football for 45 minutes.  Walking 1  miles in 35 minutes.  Pushing a stroller 1  miles in 30 minutes.  Playing basketball for 30 minutes.  Raking leaves for 30  minutes.  Bicycling 5 miles in 30 minutes.  Walking 2 miles in 30 minutes.  Dancing for 30 minutes.  Shoveling snow for 15 minutes.  Swimming laps for 20 minutes.  Walking up stairs for 15 minutes.  Bicycling 4 miles in 15 minutes.  Gardening for 30 to 45 minutes.  Jumping rope for 15 minutes.  Washing windows or floors for 45 to 60 minutes. Document Released: 12/18/2010 Document Revised: 02/07/2012 Document Reviewed: 12/18/2010 [facility] Patient Information 2015 [employer] [place]. This information is not intended to replace advice given to you by your health care provider. Make sure you discuss any questions you have with your health care provider.

## 2015-01-07 NOTE — Progress Notes (Signed)
Subjective:    Patient ID: Krystal Humphrey, female    DOB: [birth year], 29 y.o.   MRN: [id number]  Chief Complaint  Patient presents with  . Establish Care    would like to talk about weight     HPI:  Krystal Humphrey is a 29 y.o. female who presents today to establish care and discuss weight.    Weight - Indicates the associated symptom of weight gain that she has been unable to lose. Indicates that she has tried to diet and lose weight. States that she is getting close to being obese and she is wanting to change her behavior. Eats 3 meals per day which consists of fruits, lean meats, and vegetables. Avoids fried foods, processed foods or caffeine. No current structured exercise program. Has tried [employer] through [employer].  Wt Readings from Last 3 Encounters:  01/07/15 197 lb 12.8 oz (89.721 kg)  09/23/11 223 lb (101.152 kg)  09/01/11 218 lb (98.884 kg)    Allergies  Allergen Reactions  . Remeron [Mirtazapine] Anaphylaxis  . Metronidazole Nausea And Vomiting  . Tramadol Itching    No current outpatient prescriptions on file prior to visit.   No current facility-administered medications on file prior to visit.    Past Medical History  Diagnosis Date  . No pertinent past medical history   . Anxiety   . GERD (gastroesophageal reflux disease)   . Depression   . Panic attack   . Chicken pox     Past Surgical History  Procedure Laterality Date  . Tonsillectomy    . Knee surgery    . Knee arthroscopy      R knee    Family History  Problem Relation Age of Onset  . Diabetes Mother   . Thyroid disease Mother   . Heart attack Father   . Diabetes Maternal Aunt   . Diabetes Maternal Uncle   . COPD Maternal Grandmother   . Crohn's disease Maternal Grandmother   . Anesthesia problems Neg Hx   . Hypotension Neg Hx   . Malignant hyperthermia Neg Hx   . Pseudochol deficiency Neg Hx     History   Social History  . Marital Status: Divorced    Spouse Name: N/A    Number  of Children: 1  . Years of Education: 12   Occupational History  . Warehouse    Social History Main Topics  . Smoking status: Former Smoker -- 0.25 packs/day for 2 years    Quit date: 09/20/2008  . Smokeless tobacco: Never Used  . Alcohol Use: Yes     Comment: occasional  . Drug Use: No  . Sexual Activity: Not Currently    Birth Control/ Protection: Implant   Other Topics Concern  . Not on file   Social History Narrative   [place] Currently resides in a house with her boyfriend and daughter. 2 dogs. Fun: Softball, bowl   Denies religious beliefs that would effect health care.     Review of Systems  Constitutional: Negative for appetite change, fatigue and unexpected weight change.  Musculoskeletal: Negative for arthralgias.      Objective:    BP 138/88 mmHg  Pulse 72  Temp(Src) 97.8 F (36.6 C) (Oral)  Resp 18  Ht 5\' 8"  (1.727 m)  Wt 197 lb 12.8 oz (89.721 kg)  BMI 30.08 kg/m2  SpO2 98% Nursing note and vital signs reviewed.  Physical Exam  Constitutional: She is oriented to person, place, and  time. She appears well-developed and well-nourished. No distress.  Cardiovascular: Normal rate, regular rhythm, normal heart sounds and intact distal pulses.   Pulmonary/Chest: Effort normal and breath sounds normal.  Neurological: She is alert and oriented to person, place, and time.  Skin: Skin is warm and dry.  Psychiatric: She has a normal mood and affect. Her behavior is normal. Judgment and thought content normal.       Assessment & Plan:

## 2015-01-08 NOTE — Telephone Encounter (Signed)
Pt aware of results. Will send them in the mail.  

## 2015-01-31 ENCOUNTER — Ambulatory Visit (INDEPENDENT_AMBULATORY_CARE_PROVIDER_SITE_OTHER): Payer: BLUE CROSS/BLUE SHIELD | Admitting: Family

## 2015-01-31 ENCOUNTER — Encounter: Payer: Self-pay | Admitting: Family

## 2015-01-31 VITALS — BP 110/80 | HR 68 | Temp 98.2°F | Resp 18 | Ht 68.0 in | Wt 181.8 lb

## 2015-01-31 DIAGNOSIS — E669 Obesity, unspecified: Secondary | ICD-10-CM

## 2015-01-31 MED ORDER — PHENTERMINE HCL 37.5 MG PO TABS: 37.5000 mg | ORAL_TABLET | Freq: Every day | ORAL | Status: DC

## 2015-01-31 NOTE — Progress Notes (Signed)
Pre visit review using our clinic review tool, if applicable. No additional management support is needed unless otherwise documented below in the visit note. 

## 2015-01-31 NOTE — Assessment & Plan Note (Signed)
Patient has lost 16 pounds since starting phentermine. Her BMI has dropped from 30 to 27.  She is making positive lifestyle changes. Continue and refill phentermine. Follow up in 1 month or sooner if needed.

## 2015-01-31 NOTE — Progress Notes (Addendum)
   Subjective:    Patient ID: Krystal Humphrey, female    DOB: 1986-05-05, 29 y.o.   MRN: [id number]  Chief Complaint  Patient presents with  . Follow-up    has been losing weight,    HPI:  Krystal Humphrey is a 29 y.o. female who presents today for follow up.  Recently started on phentermine to assist with weight loss. Indicates that she has lost 16 lbs since last visit and is not currently experiencing any adverse side effects of the medication. She has also altered her nutrition and is eating more nutrient dense foods and gradually increasing her physical activity.      Wt Readings from Last 3 Encounters:  01/31/15 181 lb 12.8 oz (82.464 kg)  01/07/15 197 lb 12.8 oz (89.721 kg)  09/23/11 223 lb (101.152 kg)   Allergies  Allergen Reactions  . Remeron [Mirtazapine] Anaphylaxis  . Metronidazole Nausea And Vomiting  . Tramadol Itching   Current Outpatient Prescriptions  Medication Sig Dispense Refill  . phentermine (ADIPEX-P) 37.5 MG tablet Take 1 tablet (37.5 mg total) by mouth daily before breakfast. 30 tablet 0   No current facility-administered medications for this visit.      Review of Systems  Respiratory: Negative for chest tightness and shortness of breath.   Cardiovascular: Negative for chest pain, palpitations and leg swelling.  Neurological: Negative for headaches.      Objective:    BP 110/80 mmHg  Pulse 68  Temp(Src) 98.2 F (36.8 C) (Oral)  Resp 18  Ht 5\' 8"  (1.727 m)  Wt 181 lb 12.8 oz (82.464 kg)  BMI 27.65 kg/m2  SpO2 99% Nursing note and vital signs reviewed.  Physical Exam  Constitutional: She is oriented to person, place, and time. She appears well-developed and well-nourished. No distress.  Cardiovascular: Normal rate, regular rhythm, normal heart sounds and intact distal pulses.   Pulmonary/Chest: Effort normal and breath sounds normal.  Neurological: She is alert and oriented to person, place, and time.  Skin: Skin is warm and dry.    Psychiatric: She has a normal mood and affect. Her behavior is normal. Judgment and thought content normal.       Assessment & Plan:

## 2015-01-31 NOTE — Patient Instructions (Signed)
Thank you for choosing [facility].  Summary/Instructions:  Your prescription(s) have been submitted to your pharmacy or been printed and provided for you. Please take as directed and contact our office if you believe you are having problem(s) with the medication(s) or have any questions.  If your symptoms worsen or fail to improve, please contact our office for further instruction, or in case of emergency go directly to the emergency room at the closest medical facility.     

## 2015-02-18 ENCOUNTER — Telehealth: Payer: Self-pay

## 2015-02-18 NOTE — Telephone Encounter (Signed)
Submitted PA for phentermine

## 2015-02-26 ENCOUNTER — Telehealth: Payer: Self-pay | Admitting: Family

## 2015-02-26 NOTE — Telephone Encounter (Signed)
Pt called stated that the insurance is denied phentermine (ADIPEX-P) 37.5 MG tablet due to being use with other weight loss med. Pt stated this med is the only med she used, Please call pt,they need us to verify that this med is the only med and get this approve.

## 2015-02-27 NOTE — Telephone Encounter (Signed)
Office note printed showing only Phentermine is being used. Please contact patient and find out a phone number for approval.

## 2015-02-27 NOTE — Telephone Encounter (Signed)
Called and informed pt that I called insurance company and I have faxed over OV notes proving that she does not take any other weight loss substances. Pt understood. Waiting on another reply for approval for PA

## 2015-03-07 ENCOUNTER — Ambulatory Visit (INDEPENDENT_AMBULATORY_CARE_PROVIDER_SITE_OTHER): Payer: BLUE CROSS/BLUE SHIELD | Admitting: Family

## 2015-03-07 ENCOUNTER — Telehealth: Payer: Self-pay | Admitting: Family

## 2015-03-07 ENCOUNTER — Encounter: Payer: Self-pay | Admitting: Family

## 2015-03-07 VITALS — BP 124/88 | HR 75 | Temp 97.7°F | Resp 18 | Ht 68.0 in | Wt 172.0 lb

## 2015-03-07 DIAGNOSIS — E669 Obesity, unspecified: Secondary | ICD-10-CM | POA: Diagnosis not present

## 2015-03-07 MED ORDER — PHENTERMINE HCL 37.5 MG PO TABS: 37.5000 mg | ORAL_TABLET | Freq: Every day | ORAL | Status: DC

## 2015-03-07 NOTE — Progress Notes (Signed)
Pre visit review using our clinic review tool, if applicable. No additional management support is needed unless otherwise documented below in the visit note. 

## 2015-03-07 NOTE — Telephone Encounter (Signed)
Insurance denied phentermine (ADIPEX-P) 37.5 MG tablet [[id number]. They need to know that this is only medication she is taking. They claim she is taking other diet pills.

## 2015-03-07 NOTE — Telephone Encounter (Signed)
Called insurance company to find out more about what is going on. I sent over papers that they requested proving that pt was not taking any other medications as well as she is not pregnant or nursing. They received all of my office notes. The insurance company stated that the request is currently pending and it has not been denied. I called pt to make her aware that it has not been denied yet. The insurance company said they will send me a fax as soon as they get an answer. I let pt know I probably wouldn't hear anything until the beginning of next week. Pt understood.

## 2015-03-07 NOTE — Progress Notes (Signed)
   Subjective:    Patient ID: Krystal Humphrey, female    DOB: [birth year], 29 y.o.   MRN: [id number]  Chief Complaint  Patient presents with  . Follow-up    phentermine is still working     HPI:  Krystal AugustBrandi Soh is a 29 y.o. female who presents today for follow up of obesity.  Previously started on phentermine for weight loss and is on her second month of the medication. Reports nutrition changes are still going well, with occasional cheat meal. Does exercise on occasion, but does Boflex at home. Drinking plenty of water. Denies adverse effects. Notes that she can fit into size 9 which is down from size 13.  Wt Readings from Last 3 Encounters:  03/07/15 172 lb (78.019 kg)  01/31/15 181 lb 12.8 oz (82.464 kg)  01/07/15 197 lb 12.8 oz (89.721 kg)   Allergies  Allergen Reactions  . Remeron [Mirtazapine] Anaphylaxis  . Metronidazole Nausea And Vomiting  . Tramadol Itching    Current Outpatient Prescriptions on File Prior to Visit  Medication Sig Dispense Refill  . phentermine (ADIPEX-P) 37.5 MG tablet Take 1 tablet (37.5 mg total) by mouth daily before breakfast. 30 tablet 0   No current facility-administered medications on file prior to visit.    Review of Systems  Respiratory: Negative for chest tightness.   Cardiovascular: Negative for chest pain, palpitations and leg swelling.  Neurological: Negative for headaches.  Psychiatric/Behavioral: Negative for sleep disturbance.      Objective:    BP 124/88 mmHg  Pulse 75  Temp(Src) 97.7 F (36.5 C) (Oral)  Resp 18  Ht 5\' 8"  (1.727 m)  Wt 172 lb (78.019 kg)  BMI 26.16 kg/m2  SpO2 99% Nursing note and vital signs reviewed.  Physical Exam  Constitutional: She is oriented to person, place, and time. She appears well-developed and well-nourished. No distress.  Cardiovascular: Normal rate, regular rhythm, normal heart sounds and intact distal pulses.   Pulmonary/Chest: Effort normal and breath sounds normal.  Neurological: She  is alert and oriented to person, place, and time.  Skin: Skin is warm and dry.  Psychiatric: She has a normal mood and affect. Her behavior is normal. Judgment and thought content normal.       Assessment & Plan:

## 2015-03-07 NOTE — Patient Instructions (Signed)
Thank you for choosing [facility].  Summary/Instructions:  Your prescription(s) have been submitted to your pharmacy or been printed and provided for you. Please take as directed and contact our office if you believe you are having problem(s) with the medication(s) or have any questions.    

## 2015-03-07 NOTE — Assessment & Plan Note (Signed)
Patient has lost an additional 9 pounds on the second month of phentermine. Her BMI has dropped from 30 to 26. She continues to make excellent healthy lifestyle choices and has adjusted her behaviors. Refill phentermine 1 month. Discussed with patient taking every other day for this month as this will complete her course of therapy.

## 2015-05-09 ENCOUNTER — Ambulatory Visit (INDEPENDENT_AMBULATORY_CARE_PROVIDER_SITE_OTHER): Payer: BLUE CROSS/BLUE SHIELD | Admitting: Family

## 2015-05-09 ENCOUNTER — Encounter: Payer: Self-pay | Admitting: Family

## 2015-05-09 VITALS — BP 118/80 | HR 78 | Temp 97.9°F | Resp 18 | Ht 68.0 in | Wt 164.8 lb

## 2015-05-09 DIAGNOSIS — E669 Obesity, unspecified: Secondary | ICD-10-CM | POA: Diagnosis not present

## 2015-05-09 NOTE — Progress Notes (Signed)
   Subjective:    Patient ID: Krystal Humphrey, female    DOB: 31-Oct-1986, 29 y.o.   MRN: [id number]  Chief Complaint  Patient presents with  . medication follow up    phentermine    HPI:  Krystal Humphrey is a 29 y.o. female with a PMH of acid reflux, iron deficiency anemia, and peptic ulcer who presents today for an office follow up.   1.) Obesity - recently completed her third month of phentermine and has lost an additional 8 pounds bringing her total to weight loss to 33 pounds. Continues to exercise well and eats a balanced nutrient dense diet. Indicates she still has several of the previous phentermine tablets as part of her weaning. Indicates that she feels great.   Wt Readings from Last 3 Encounters:  05/09/15 164 lb 12.8 oz (74.753 kg)  03/07/15 172 lb (78.019 kg)  01/31/15 181 lb 12.8 oz (82.464 kg)    Allergies  Allergen Reactions  . Remeron [Mirtazapine] Anaphylaxis  . Metronidazole Nausea And Vomiting  . Tramadol Itching    No current outpatient prescriptions on file prior to visit.   No current facility-administered medications on file prior to visit.    Review of Systems  Respiratory: Negative for chest tightness.   Cardiovascular: Negative for chest pain, palpitations and leg swelling.      Objective:    BP 118/80 mmHg  Pulse 78  Temp(Src) 97.9 F (36.6 C) (Oral)  Resp 18  Ht 5\' 8"  (1.727 m)  Wt 164 lb 12.8 oz (74.753 kg)  BMI 25.06 kg/m2  SpO2 99% Nursing note and vital signs reviewed.  Physical Exam  Constitutional: She is oriented to person, place, and time. She appears well-developed and well-nourished. No distress.  Cardiovascular: Normal rate, regular rhythm, normal heart sounds and intact distal pulses.   Pulmonary/Chest: Effort normal and breath sounds normal.  Neurological: She is alert and oriented to person, place, and time.  Skin: Skin is warm and dry.  Psychiatric: She has a normal mood and affect. Her behavior is normal. Judgment and  thought content normal.       Assessment & Plan:   Problem List Items Addressed This Visit      Other   Obesity - Primary    Weight loss program has resulted in a 33 pound weight loss. Over the course of treatment her BMI went from 30 to 25 and she has lost close to 7 dress sizes. Discontinue phentermine. Continue healthy lifestyle choices and physical activity. Follow up in 6 months for a physical. Problem of obesity will be resolved with this visit.

## 2015-05-09 NOTE — Patient Instructions (Addendum)
Thank you for choosing [facility].  Summary/Instructions:  Please follow up in 6 months for a physical.

## 2015-05-09 NOTE — Assessment & Plan Note (Addendum)
Weight loss program has resulted in a 33 pound weight loss. Over the course of treatment her BMI went from 30 to 25 and she has lost close to 7 dress sizes. Discontinue phentermine. Continue healthy lifestyle choices and physical activity. Follow up in 6 months for a physical. Problem of obesity will be resolved with this visit.

## 2015-05-09 NOTE — Progress Notes (Signed)
Pre visit review using our clinic review tool, if applicable. No additional management support is needed unless otherwise documented below in the visit note. 

## 2015-10-03 ENCOUNTER — Emergency Department (HOSPITAL_COMMUNITY)
Admission: EM | Admit: 2015-10-03 | Discharge: 2015-10-03 | Disposition: A | Discharge status: Home / Self Care | Payer: No Typology Code available for payment source | Attending: Emergency Medicine | Admitting: Emergency Medicine

## 2015-10-03 ENCOUNTER — Emergency Department (HOSPITAL_COMMUNITY): Payer: No Typology Code available for payment source

## 2015-10-03 ENCOUNTER — Encounter (HOSPITAL_COMMUNITY): Payer: Self-pay | Admitting: Emergency Medicine

## 2015-10-03 DIAGNOSIS — Z8719 Personal history of other diseases of the digestive system: Secondary | ICD-10-CM | POA: Diagnosis not present

## 2015-10-03 DIAGNOSIS — Y92410 Unspecified street and highway as the place of occurrence of the external cause: Secondary | ICD-10-CM | POA: Insufficient documentation

## 2015-10-03 DIAGNOSIS — Z8659 Personal history of other mental and behavioral disorders: Secondary | ICD-10-CM | POA: Diagnosis not present

## 2015-10-03 DIAGNOSIS — Y998 Other external cause status: Secondary | ICD-10-CM | POA: Insufficient documentation

## 2015-10-03 DIAGNOSIS — Z87891 Personal history of nicotine dependence: Secondary | ICD-10-CM | POA: Insufficient documentation

## 2015-10-03 DIAGNOSIS — Z8619 Personal history of other infectious and parasitic diseases: Secondary | ICD-10-CM | POA: Diagnosis not present

## 2015-10-03 DIAGNOSIS — Y9389 Activity, other specified: Secondary | ICD-10-CM | POA: Diagnosis not present

## 2015-10-03 DIAGNOSIS — S299XXA Unspecified injury of thorax, initial encounter: Secondary | ICD-10-CM | POA: Diagnosis present

## 2015-10-03 MED ORDER — IBUPROFEN 400 MG PO TABS
800.0000 mg | ORAL_TABLET | Freq: Once | ORAL | Status: AC
  Administered 2015-10-03: 800 mg via ORAL
  Filled 2015-10-03: qty 2

## 2015-10-03 MED ORDER — METHOCARBAMOL 500 MG PO TABS: 500.0000 mg | ORAL_TABLET | Freq: Two times a day (BID) | ORAL | Status: DC

## 2015-10-03 MED ORDER — IBUPROFEN 800 MG PO TABS: 800.0000 mg | ORAL_TABLET | Freq: Three times a day (TID) | ORAL | Status: DC | PRN
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
## 2015-10-03 NOTE — Discharge Instructions (Signed)

## 2015-11-14 ENCOUNTER — Ambulatory Visit: Payer: Self-pay | Admitting: Family

## 2015-11-17 ENCOUNTER — Emergency Department (HOSPITAL_COMMUNITY): Payer: PRIVATE HEALTH INSURANCE

## 2015-11-17 ENCOUNTER — Encounter (HOSPITAL_BASED_OUTPATIENT_CLINIC_OR_DEPARTMENT_OTHER): Payer: Self-pay | Admitting: Emergency Medicine

## 2015-11-17 ENCOUNTER — Encounter (HOSPITAL_COMMUNITY): Payer: Self-pay | Admitting: *Deleted

## 2015-11-17 ENCOUNTER — Emergency Department (HOSPITAL_COMMUNITY)
Admission: EM | Admit: 2015-11-17 | Discharge: 2015-11-17 | Disposition: A | Discharge status: Home / Self Care | Payer: PRIVATE HEALTH INSURANCE | Attending: Emergency Medicine | Admitting: Emergency Medicine

## 2015-11-17 DIAGNOSIS — Z87891 Personal history of nicotine dependence: Secondary | ICD-10-CM | POA: Insufficient documentation

## 2015-11-17 DIAGNOSIS — Y998 Other external cause status: Secondary | ICD-10-CM | POA: Insufficient documentation

## 2015-11-17 DIAGNOSIS — S161XXA Strain of muscle, fascia and tendon at neck level, initial encounter: Secondary | ICD-10-CM | POA: Insufficient documentation

## 2015-11-17 DIAGNOSIS — R0789 Other chest pain: Secondary | ICD-10-CM

## 2015-11-17 DIAGNOSIS — Y92410 Unspecified street and highway as the place of occurrence of the external cause: Secondary | ICD-10-CM | POA: Insufficient documentation

## 2015-11-17 DIAGNOSIS — S29001A Unspecified injury of muscle and tendon of front wall of thorax, initial encounter: Secondary | ICD-10-CM | POA: Insufficient documentation

## 2015-11-17 DIAGNOSIS — Z8659 Personal history of other mental and behavioral disorders: Secondary | ICD-10-CM | POA: Insufficient documentation

## 2015-11-17 DIAGNOSIS — Y9389 Activity, other specified: Secondary | ICD-10-CM | POA: Insufficient documentation

## 2015-11-17 DIAGNOSIS — Z8719 Personal history of other diseases of the digestive system: Secondary | ICD-10-CM | POA: Insufficient documentation

## 2015-11-17 DIAGNOSIS — Z8619 Personal history of other infectious and parasitic diseases: Secondary | ICD-10-CM | POA: Insufficient documentation

## 2015-11-17 LAB — I-STAT CHEM 8, ED
BUN: 15 mg/dL (ref 6–20)
CHLORIDE: 103 mmol/L (ref 101–111)
CREATININE: 0.7 mg/dL (ref 0.44–1.00)
Calcium, Ion: 1.13 mmol/L (ref 1.12–1.23)
Glucose, Bld: 93 mg/dL (ref 65–99)
HEMATOCRIT: 45 % (ref 36.0–46.0)
Hemoglobin: 15.3 g/dL — ABNORMAL HIGH (ref 12.0–15.0)
Potassium: 3.9 mmol/L (ref 3.5–5.1)
SODIUM: 141 mmol/L (ref 135–145)
TCO2: 26 mmol/L (ref 0–100)

## 2015-11-17 MED ORDER — IOHEXOL 350 MG/ML SOLN
50.0000 mL | Freq: Once | INTRAVENOUS | Status: AC | PRN
  Administered 2015-11-17: 50 mL via INTRAVENOUS

## 2015-11-17 MED ORDER — HYDROCODONE-ACETAMINOPHEN 5-325 MG PO TABS: 1.0000 | ORAL_TABLET | ORAL | Status: DC | PRN

## 2015-11-17 MED ORDER — FENTANYL CITRATE (PF) 100 MCG/2ML IJ SOLN
50.0000 ug | Freq: Once | INTRAMUSCULAR | Status: AC
Start: 2015-11-17 — End: 2015-11-17
  Administered 2015-11-17: 50 ug via INTRAVENOUS
  Filled 2015-11-17: qty 2

## 2015-11-17 MED ORDER — SODIUM CHLORIDE 0.9 % IV BOLUS (SEPSIS)
1000.0000 mL | Freq: Once | INTRAVENOUS | Status: AC
  Administered 2015-11-17: 1000 mL via INTRAVENOUS

## 2015-11-17 MED ORDER — MORPHINE SULFATE (PF) 4 MG/ML IV SOLN
4.0000 mg | Freq: Once | INTRAVENOUS | Status: AC
  Administered 2015-11-17: 4 mg via INTRAVENOUS
  Filled 2015-11-17: qty 1

## 2015-11-17 MED ORDER — SODIUM CHLORIDE 0.9 % IV BOLUS (SEPSIS)
1000.0000 mL | Freq: Once | INTRAVENOUS | Status: AC
Start: 2015-11-17 — End: 2015-11-17
  Administered 2015-11-17: 1000 mL via INTRAVENOUS

## 2015-11-17 NOTE — ED Notes (Signed)
Pt is in stable condition upon d/c and ambulates from ED. 
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
## 2016-01-19 ENCOUNTER — Ambulatory Visit (INDEPENDENT_AMBULATORY_CARE_PROVIDER_SITE_OTHER): Payer: PRIVATE HEALTH INSURANCE | Admitting: Family

## 2016-01-19 ENCOUNTER — Encounter: Payer: Self-pay | Admitting: Family

## 2016-01-19 VITALS — BP 130/92 | HR 69 | Temp 98.0°F | Resp 16 | Ht 68.0 in | Wt 183.0 lb

## 2016-01-19 DIAGNOSIS — R109 Unspecified abdominal pain: Secondary | ICD-10-CM | POA: Insufficient documentation

## 2016-01-19 NOTE — Assessment & Plan Note (Signed)
Abdominal exam is benign with no acute findings. Symptoms are most likely related to decreased caloric intake, increased physical activity, and change in diet composition. Recommend calorie tracking to ensure adequate intake and increase fiber intake gradually. She has gained significant amount of muscle since last visit. Follow up if symptoms worsen or fail to improve.

## 2016-01-19 NOTE — Patient Instructions (Signed)
Thank you for choosing [facility].  Summary/Instructions:  Your prescription(s) have been submitted to your pharmacy or been printed and provided for you. Please take as directed and contact our office if you believe you are having problem(s) with the medication(s) or have any questions.  If your symptoms worsen or fail to improve, please contact our office for further instruction, or in case of emergency go directly to the emergency room at the closest medical facility.   Please use MyFitnessPal or other calorie tracking software to make sure you are getting enough calories.   Continue frequent, small meals.   Follow up if your symptoms worsen or does not improve.

## 2016-01-19 NOTE — Progress Notes (Signed)
   Subjective:    Patient ID: Krystal Humphrey, female    DOB: August 26, 1986, 30 y.o.   MRN: [id number]  Chief Complaint  Patient presents with  . Abdominal Pain    If she does not eat alot she feels weird like she is light headed but if she eats alot she gets sick    HPI:  Krystal Humphrey is a 30 y.o. female who  has a past medical history of No pertinent past medical history; Anxiety; GERD (gastroesophageal reflux disease); Depression; Panic attack; and Chicken pox. and presents today for a follow up office visit.   1.) Weight management - She previously completed a 3 month course of phentermine. She continues to eat frequent small meals and perform physical activity daily for about 30 minutes at a time. She notes that if she does not eat a lot she feels as though she gets lightheaded, but if she eats a lot she tends to feel ill.   Wt Readings from Last 3 Encounters:  01/19/16 183 lb (83.008 kg)  05/09/15 164 lb 12.8 oz (74.753 kg)  03/07/15 172 lb (78.019 kg)     Allergies  Allergen Reactions  . Remeron [Mirtazapine] Anaphylaxis  . Metronidazole Nausea And Vomiting  . Tramadol Itching     No current outpatient prescriptions on file prior to visit.   No current facility-administered medications on file prior to visit.    Review of Systems  Constitutional: Negative for fever and chills.  Gastrointestinal: Negative for nausea, vomiting, abdominal pain, diarrhea, constipation and blood in stool.  Neurological: Positive for light-headedness. Negative for dizziness and headaches.      Objective:    BP 130/92 mmHg  Pulse 69  Temp(Src) 98 F (36.7 C) (Oral)  Resp 16  Ht  (1.727 m)  Wt 183 lb (83.008 kg)  BMI 27.83 kg/m2  SpO2 99% Nursing note and vital signs reviewed.  Physical Exam  Constitutional: She is oriented to person, place, and time. She appears well-developed and well-nourished. No distress.  Cardiovascular: Normal rate, regular rhythm, normal heart sounds and  intact distal pulses.   Pulmonary/Chest: Effort normal and breath sounds normal.  Abdominal: Soft. Normal appearance and bowel sounds are normal. She exhibits no mass. There is no hepatosplenomegaly. There is no tenderness. There is no rigidity, no rebound, no guarding, no tenderness at McBurney's point and negative Murphy's sign.  Neurological: She is alert and oriented to person, place, and time.  Skin: Skin is warm and dry.  Psychiatric: She has a normal mood and affect. Her behavior is normal. Judgment and thought content normal.       Assessment & Plan:   Problem List Items Addressed This Visit      Other   Abdominal discomfort - Primary    Abdominal exam is benign with no acute findings. Symptoms are most likely related to decreased caloric intake, increased physical activity, and change in diet composition. Recommend calorie tracking to ensure adequate intake and increase fiber intake gradually. She has gained significant amount of muscle since last visit. Follow up if symptoms worsen or fail to improve.

## 2016-01-19 NOTE — Progress Notes (Signed)
Pre visit review using our clinic review tool, if applicable. No additional management support is needed unless otherwise documented below in the visit note. 

## 2016-01-27 ENCOUNTER — Telehealth: Payer: Self-pay | Admitting: Family

## 2016-01-27 ENCOUNTER — Encounter (HOSPITAL_BASED_OUTPATIENT_CLINIC_OR_DEPARTMENT_OTHER): Payer: Self-pay | Admitting: *Deleted

## 2016-01-27 ENCOUNTER — Emergency Department (HOSPITAL_BASED_OUTPATIENT_CLINIC_OR_DEPARTMENT_OTHER): Payer: PRIVATE HEALTH INSURANCE

## 2016-01-27 ENCOUNTER — Emergency Department (HOSPITAL_BASED_OUTPATIENT_CLINIC_OR_DEPARTMENT_OTHER)
Admission: EM | Admit: 2016-01-27 | Discharge: 2016-01-27 | Disposition: A | Discharge status: Home / Self Care | Payer: PRIVATE HEALTH INSURANCE | Attending: Physician Assistant | Admitting: Physician Assistant

## 2016-01-27 DIAGNOSIS — M79601 Pain in right arm: Secondary | ICD-10-CM | POA: Diagnosis present

## 2016-01-27 DIAGNOSIS — Z8659 Personal history of other mental and behavioral disorders: Secondary | ICD-10-CM | POA: Diagnosis not present

## 2016-01-27 DIAGNOSIS — M25421 Effusion, right elbow: Secondary | ICD-10-CM

## 2016-01-27 DIAGNOSIS — R2231 Localized swelling, mass and lump, right upper limb: Secondary | ICD-10-CM | POA: Insufficient documentation

## 2016-01-27 DIAGNOSIS — Z8719 Personal history of other diseases of the digestive system: Secondary | ICD-10-CM | POA: Diagnosis not present

## 2016-01-27 DIAGNOSIS — Z8619 Personal history of other infectious and parasitic diseases: Secondary | ICD-10-CM | POA: Insufficient documentation

## 2016-01-27 DIAGNOSIS — IMO0002 Reserved for concepts with insufficient information to code with codable children: Secondary | ICD-10-CM

## 2016-01-27 DIAGNOSIS — Z87891 Personal history of nicotine dependence: Secondary | ICD-10-CM | POA: Diagnosis not present

## 2016-01-27 DIAGNOSIS — R229 Localized swelling, mass and lump, unspecified: Secondary | ICD-10-CM

## 2016-01-27 MED ORDER — ACETAMINOPHEN 325 MG PO TABS
ORAL_TABLET | ORAL | Status: AC
  Filled 2016-01-27: qty 2

## 2016-01-27 MED ORDER — ACETAMINOPHEN 325 MG PO TABS
650.0000 mg | ORAL_TABLET | Freq: Once | ORAL | Status: AC
  Administered 2016-01-27: 650 mg via ORAL

## 2016-01-27 NOTE — Telephone Encounter (Signed)
[facility] - Client TELEPHONE ADVICE RECORD [facility]  Patient Name: Krystal Humphrey  DOB: [birth year]    Initial Comment Caller states she has lump above her elbow, bruise and not going away. Painful.   Nurse Assessment  Nurse: Phylliss Bob, RN, Synetta Fail Date/Time Lamount Cohen Time): 01/27/2016 12:00:17 PM  Confirm and document reason for call. If symptomatic, describe symptoms. You must click the next button to save text entered. ---Caller states she has lump above her elbow, bruise and not going away. Painful. caller stated that she has an area on the right elbow and has about nickel sized and has been able to move and bend her elbow and has a pain rated at 9/10 and has no redness around the area and ahs had no fevers and has a purplish discoloration no insect bites  Has the patient traveled out of the country within the last 30 days? ---No  Does the patient have any new or worsening symptoms? ---Yes  Will a triage be completed? ---Yes  Related visit to physician within the last 2 weeks? ---No  Does the PT have any chronic conditions? (i.e. diabetes, asthma, etc.) ---No  Is the patient pregnant or possibly pregnant? (Ask all females between the ages of 27-55) ---No  Is this a behavioral health or substance abuse call? ---No     Guidelines    Guideline Title Affirmed Question Affirmed Notes  Skin Lump or Localized Swelling SEVERE pain (e.g., excruciating)    Final Disposition User   See Physician within 4 Hours (or PCP triage) Phylliss Bob, RN, Synetta Fail    Comments  call placed to the office back line at [phone] spoke with Judeth Cornfield and informed of the patient status and the need for an appointment and warm transferred to the caller   Referrals  REFERRED TO PCP OFFICE   Disagree/Comply: Comply

## 2016-01-27 NOTE — Telephone Encounter (Signed)
Spoke to this pt and she is going to urgent care for immediate attention.

## 2016-01-27 NOTE — Telephone Encounter (Signed)
Forwarding msg to schedulers to call for appt...Krystal Humphrey

## 2016-01-27 NOTE — ED Provider Notes (Signed)
CSN: [id number]     Arrival date & time 01/27/16  1249 History   First MD Initiated Contact with Patient 01/27/16 1306     Chief Complaint  Patient presents with  . Arm Pain     (Consider location/radiation/quality/duration/timing/severity/associated sxs/prior Treatment) HPI  Patient is a 30 year old female presenting with left arm "knot". Patient has history of anxiety GERD depression and panic attacks. Patient works at a job where she uses repetitive motions of bilateral arms. Patient reports she is family history of "multiple blood clots at a young age".  Patient has noted less than 1 cm swelling, firm discrete area at to the back of her right arm for the last several days.  No shortness breath. No rash. No erythema.   Past Medical History  Diagnosis Date  . No pertinent past medical history   . Anxiety   . GERD (gastroesophageal reflux disease)   . Depression   . Panic attack   . Chicken pox    Past Surgical History  Procedure Laterality Date  . Tonsillectomy    . Knee surgery    . Knee arthroscopy      R knee   Family History  Problem Relation Age of Onset  . Diabetes Mother   . Thyroid disease Mother   . Heart attack Father   . Diabetes Maternal Aunt   . Diabetes Maternal Uncle   . COPD Maternal Grandmother   . Crohn's disease Maternal Grandmother   . Anesthesia problems Neg Hx   . Hypotension Neg Hx   . Malignant hyperthermia Neg Hx   . Pseudochol deficiency Neg Hx    Social History  Substance Use Topics  . Smoking status: Former Smoker -- 0.25 packs/day for 2 years    Quit date: 09/20/2008  . Smokeless tobacco: Never Used  . Alcohol Use: Yes     Comment: occasional   OB History    Gravida Para Term Preterm AB TAB SAB Ectopic Multiple Living   Review of Systems  Constitutional: Negative for fever, activity change and fatigue.  HENT: Negative for congestion.   Respiratory: Negative for shortness of breath.   Cardiovascular:  Negative for chest pain.  Gastrointestinal: Negative for abdominal pain.  Musculoskeletal: Negative for myalgias.      Allergies  Remeron; Metronidazole; and Tramadol  Home Medications   Prior to Admission medications   Not on File   BP 131/78 mmHg  Pulse 84  Temp(Src) 98.5 F (36.9 C) (Oral)  Resp 16  Ht  (1.727 m)  Wt 178 lb (80.74 kg)  BMI 27.07 kg/m2  SpO2 100% Physical Exam  Constitutional: She is oriented to person, place, and time. She appears well-developed and well-nourished.  HENT:  Head: Normocephalic and atraumatic.  Eyes: Conjunctivae are normal. Right eye exhibits no discharge.  Neck: Neck supple.  Cardiovascular: Normal rate, regular rhythm and normal heart sounds.   No murmur heard. Pulmonary/Chest: Effort normal and breath sounds normal. She has no wheezes. She has no rales.  Musculoskeletal: Normal range of motion. She exhibits no edema.  Less than 1 cm discrete nodule on the back of left arm. Irritated with range of motion of elbow. No surrounding erythema, swelling.  Neurological: She is oriented to person, place, and time. No cranial nerve deficit.  Skin: Skin is warm and dry. No rash noted. She is not diaphoretic.  Psychiatric: She has a normal mood and affect.  Her behavior is normal.  Nursing note and vitals reviewed.   ED Course  Procedures (including critical care time) Labs Review Labs Reviewed - No data to display  Imaging Review US Venous Img Upper Uni Right  01/27/2016  CLINICAL DATA:  Right upper arm pain and palpable abnormality distally near the elbow. EXAM: RIGHT UPPER EXTREMITY VENOUS DOPPLER ULTRASOUND TECHNIQUE: Gray-scale sonography with graded compression, as well as color Doppler and duplex ultrasound were performed to evaluate the upper extremity deep venous system from the level of the subclavian vein and including the jugular, axillary, basilic, radial, ulnar and upper cephalic vein. Spectral Doppler was utilized to  evaluate flow at rest and with distal augmentation maneuvers. COMPARISON:  None. FINDINGS: Contralateral Subclavian Vein: Respiratory phasicity is normal and symmetric with the symptomatic side. No evidence of thrombus. Normal compressibility. Internal Jugular Vein: No evidence of thrombus. Normal compressibility, respiratory phasicity and response to augmentation. Subclavian Vein: No evidence of thrombus. Normal compressibility, respiratory phasicity and response to augmentation. Axillary Vein: No evidence of thrombus. Normal compressibility, respiratory phasicity and response to augmentation. Cephalic Vein: No evidence of thrombus. Normal compressibility, respiratory phasicity and response to augmentation. Basilic Vein: No evidence of thrombus. Normal compressibility, respiratory phasicity and response to augmentation. Brachial Veins: No evidence of thrombus. Normal compressibility, respiratory phasicity and response to augmentation. Radial Veins: No evidence of thrombus. Normal compressibility, respiratory phasicity and response to augmentation. Ulnar Veins: No evidence of thrombus. Normal compressibility, respiratory phasicity and response to augmentation. Venous Reflux:  None visualized. Other Findings: Thickening and mild increased echogenicity of the subcutaneous fat is seen in the distal arm, superior and posterior to the elbow, at the site of the palpable abnormality. This could be due to contusion or cellulitis. No solid mass or fluid collection identified. IMPRESSION: No evidence of deep venous thrombosis. Thickening and increased echogenicity of subcutaneous fat in the distal arm near the elbow, corresponding to site of palpable abnormality. Differential considerations include contusion in cellulitis. No distinct mass or abscess visualized sonographically. Consider MRI for further evaluation if clinically warranted. Electronically Signed   By: Myles Rosenthal M.D.   On: 01/27/2016 14:41   I have personally  reviewed and evaluated these images and lab results as part of my medical decision-making.   EKG Interpretation None      MDM   Final diagnoses:  Lump  Swelling of joint of upper arm, right    Patient is a 30 year old female resenting with discrete nodule to the back of her right arm. Patient had this for several days. She reportsit becomes irritated and painful with the rolling motion she does at work. Irritated with flexing of the right arm. Patient very concerned about blood clot, she has a family history of blood clots. We will get ultrasound. Anticipate negative ultrasound given location and symptomatology. Concern about overuse injury. We'll have her follow up with primary care physician use heat, light duty and follow-up.  2:48 PM US shows no DVT.   No evidence of external cellulitis. Will have patient follow up with PCP this week.  She can get outpatient MRI as needed for further eval. No emergent need for MRI here- normal ROM, externally appears normal, no weakness, no numbness.     Floreine Kingdon Randall An, MD 01/27/16 [phone]

## 2016-01-27 NOTE — ED Notes (Signed)
Right arm pain x 1 week. Has a "knot" to the back of right arm. Concerned for blood clot

## 2016-01-27 NOTE — ED Notes (Signed)
MD at bedside. 

## 2016-01-27 NOTE — ED Notes (Signed)
Patient transported to Ultrasound 

## 2016-01-27 NOTE — Discharge Instructions (Signed)
You were seen for a lump on her elbow. Ultrasound showed no blood clot. He may require further imaging as an outpatient. We will you to rest ice and elevate until the follow up with your primary care physician.  Please return if the pain gets worse, if you have redness or signs of infection.  Generic Elbow Exercises EXERCISES RANGE OF MOTION (ROM) AND STRETCHING EXERCISES  These exercises may help you when beginning to rehabilitate your injury. Your symptoms may go away with or without further involvement from your physician, physical therapist or athletic trainer. While completing these exercises, remember:   Restoring tissue flexibility helps normal motion to return to the joints. This allows healthier, less painful movement and activity.  An effective stretch should be held for at least 30 seconds.  A stretch should never be painful. You should only feel a gentle lengthening or release in the stretched tissue. RANGE OF MOTION - Extension  Hold your right / left arm at your side and straighten your elbow as far as you can, using your right / left arm muscles.  Straighten the elbow farther by gently pushing down on your forearm until you feel a gentle stretch on the inside of your elbow. Hold this position for __________ seconds.  Slowly return to the starting position. Repeat __________ times. Complete this exercise __________ times per day.  RANGE OF MOTION - Flexion  Hold your right / left arm at your side and bend your elbow as far as you can using your arm muscles.  Bend the right / left elbow farther by gently pushing up on your forearm until you feel a gentle stretch on the outside of your elbow. Hold this position for __________ seconds.  Slowly return to the starting position. Repeat __________ times. Complete this exercise __________ times per day.  RANGE OF MOTION - Supination, Active  Stand or sit with your elbows at your side. Bend your right / left elbow to 90  degrees.  Turn your palm upward until you feel a gentle stretch on the inside of your forearm.  Hold this position for __________ seconds. Slowly release and return to the starting position. Repeat __________ times. Complete this stretch __________ times per day.  RANGE OF MOTION - Pronation, Active  Stand or sit with your elbows at your side. Bend your right / left elbow to 90 degrees.  Turn your palm downward until you feel a gentle stretch on the top of your forearm.  Hold this position for __________ seconds. Slowly release and return to the starting position. Repeat __________ times. Complete this stretch __________ times per day.  STRETCH - Elbow Flexors  Lie on a firm bed or countertop, on your back. Be sure that you are in a comfortable position which will allow you to relax your arm muscles.  Place a folded towel under your right / left upper arm, so that your elbow and shoulder are at the same height. Extend your arm; your elbow should not rest on the bed or towel  Allow the weight of your hand to straighten your elbow. Keep your arm and chest muscles relaxed. Your caregiver may ask you to increase the intensity of your stretch by adding a small wrist or hand weight.  Hold for __________ seconds. You should feel a stretch on the inside of your elbow. Slowly return to the starting position. Repeat __________ times. Complete this exercise __________ times per day. STRENGTHENING EXERCISES  These exercises may help you when beginning to  rehabilitate your injury. They may resolve your symptoms with or without further involvement from your physician, physical therapist or athletic trainer. While completing these exercises, remember:   Muscles can gain both the endurance and the strength needed for everyday activities through controlled exercises.  Complete these exercises as instructed by your physician, physical therapist or athletic trainer. Increase the resistance and repetitions  only as guided.  You may experience muscle soreness or fatigue, but the pain or discomfort you are trying to eliminate should never worsen during these exercises. If this pain does get worse, stop and make sure you are following the directions exactly. If the pain is still present after adjustments, discontinue the exercise until you can discuss the trouble with your caregiver. STRENGTH - Elbow Flexors, Isometric   Stand or sit upright on a firm surface. Place your right / left arm so that your hand is palm-up and at the height of your waist.  Place your opposite hand on top of your forearm. Gently push down as your right / left arm resists. Push as hard as you can with both arms without causing any pain or movement at your right / left elbow. Hold this stationary position for __________ seconds.  Gradually release the tension in both arms. Allow your muscles to relax completely before repeating. Repeat __________ times. Complete this exercise __________ times per day. STRENGTH - Elbow Extensors, Isometric  Stand or sit upright on a firm surface. Place your right / left arm so that your palm faces your abdomen and it is at the height of your waist.  Place your opposite hand on the underside of your forearm. Gently push up as your right / left arm resists. Push as hard as you can with both arms without causing any pain or movement at your right / left elbow. Hold this stationary position for __________ seconds.  Gradually release the tension in both arms. Allow your muscles to relax completely before repeating. Repeat __________ times. Complete this exercise __________ times per day. STRENGTH - Elbow Flexors, Supinated  With good posture, stand, or sit on a firm chair without armrests. Allow your right / left arm to rest at your side with your palm facing forward.  Holding a __________ weight, or gripping a rubber exercise band or tubing, bring your hand toward your shoulder.  Allow your  muscles to control the resistance, as your hand returns to your side. Repeat __________ times. Complete this exercise __________ times per day.  STRENGTH - Elbow Extensors, Dynamic  With good posture, stand, or sit on a firm chair without armrests. Keeping your upper arms at your side, bring both hands up toward your right / left shoulder while gripping a rubber exercise band or tubing. Your right / left hand should be just below the other hand.  Straighten your right / left elbow. Hold for __________ seconds.  Allow your muscles to control the rubber exercise band, as your hand returns to your shoulder. Repeat __________ times. Complete this exercise __________ times per day. STRENGTH - Forearm Supinators   Sit with your right / left forearm supported on a table, keeping your elbow below shoulder height. Rest your hand over the edge, palm down.  Gently grip a hammer or a soup ladle.  Without moving your elbow, slowly turn your palm and hand upward to a "thumbs-up" position.  Hold this position for __________ seconds. Slowly return to the starting position. Repeat __________ times. Complete this exercise __________ times per day.  STRENGTH -  Forearm Pronators  Sit with your right / left forearm supported on a table, keeping your elbow below shoulder height. Rest your hand over the edge, palm up.  Gently grip a hammer or a soup ladle.  Without moving your elbow, slowly turn your palm and hand upward to a "thumbs-up" position.  Hold this position for __________ seconds. Slowly return to the starting position. Repeat __________ times. Complete this exercise __________ times per day.   This information is not intended to replace advice given to you by your health care provider. Make sure you discuss any questions you have with your health care provider.   Document Released: 09/29/2005 Document Revised: 12/06/2014 Document Reviewed: 02/27/2009 Elsevier Interactive Patient Education 2016  Elsevier Inc[employer]  [employer] Therapy Heat therapy can help ease sore, stiff, injured, and tight muscles and joints. Heat relaxes your muscles, which may help ease your pain. Heat therapy should only be used on old, pre-existing, or long-lasting (chronic) injuries. Do not use heat therapy unless told by your doctor. HOW TO USE HEAT THERAPY There are several different kinds of heat therapy, including:  Moist heat pack.  Warm water bath.  Hot water bottle.  Electric heating pad.  Heated gel pack.  Heated wrap.  Electric heating pad. GENERAL HEAT THERAPY RECOMMENDATIONS   Do not sleep while using heat therapy. Only use heat therapy while you are awake.  Your skin may turn pink while using heat therapy. Do not use heat therapy if your skin turns red.  Do not use heat therapy if you have new pain.  High heat or long exposure to heat can cause burns. Be careful when using heat therapy to avoid burning your skin.  Do not use heat therapy on areas of your skin that are already irritated, such as with a rash or sunburn. GET HELP IF:   You have blisters, redness, swelling (puffiness), or numbness.  You have new pain.  Your pain is worse. MAKE SURE YOU:  Understand these instructions.  Will watch your condition.  Will get help right away if you are not doing well or get worse.   This information is not intended to replace advice given to you by your health care provider. Make sure you discuss any questions you have with your health care provider.   Document Released: 02/07/2012 Document Revised: 12/06/2014 Document Reviewed: 01/08/2014 Elsevier Interactive Patient Education [employer]

## 2016-02-02 ENCOUNTER — Ambulatory Visit: Payer: PRIVATE HEALTH INSURANCE | Admitting: Family

## 2016-02-04 ENCOUNTER — Emergency Department (HOSPITAL_COMMUNITY)
Admission: EM | Admit: 2016-02-04 | Discharge: 2016-02-04 | Disposition: A | Discharge status: Home / Self Care | Payer: PRIVATE HEALTH INSURANCE | Attending: Emergency Medicine | Admitting: Emergency Medicine

## 2016-02-04 ENCOUNTER — Encounter (HOSPITAL_COMMUNITY): Payer: Self-pay | Admitting: *Deleted

## 2016-02-04 ENCOUNTER — Emergency Department (HOSPITAL_COMMUNITY): Payer: PRIVATE HEALTH INSURANCE

## 2016-02-04 DIAGNOSIS — Z3202 Encounter for pregnancy test, result negative: Secondary | ICD-10-CM | POA: Diagnosis not present

## 2016-02-04 DIAGNOSIS — R1011 Right upper quadrant pain: Secondary | ICD-10-CM | POA: Insufficient documentation

## 2016-02-04 DIAGNOSIS — Z8659 Personal history of other mental and behavioral disorders: Secondary | ICD-10-CM | POA: Diagnosis not present

## 2016-02-04 DIAGNOSIS — R197 Diarrhea, unspecified: Secondary | ICD-10-CM | POA: Diagnosis not present

## 2016-02-04 DIAGNOSIS — R110 Nausea: Secondary | ICD-10-CM | POA: Insufficient documentation

## 2016-02-04 DIAGNOSIS — Z8619 Personal history of other infectious and parasitic diseases: Secondary | ICD-10-CM | POA: Insufficient documentation

## 2016-02-04 DIAGNOSIS — Z8719 Personal history of other diseases of the digestive system: Secondary | ICD-10-CM | POA: Diagnosis not present

## 2016-02-04 DIAGNOSIS — Z87891 Personal history of nicotine dependence: Secondary | ICD-10-CM | POA: Insufficient documentation

## 2016-02-04 DIAGNOSIS — R109 Unspecified abdominal pain: Secondary | ICD-10-CM

## 2016-02-04 LAB — I-STAT BETA HCG BLOOD, ED (MC, WL, AP ONLY)

## 2016-02-04 LAB — CBC
HCT: 40.3 % (ref 36.0–46.0)
Hemoglobin: 13.9 g/dL (ref 12.0–15.0)
MCH: 31.2 pg (ref 26.0–34.0)
MCHC: 34.5 g/dL (ref 30.0–36.0)
MCV: 90.4 fL (ref 78.0–100.0)
PLATELETS: 267 10*3/uL (ref 150–400)
RBC: 4.46 MIL/uL (ref 3.87–5.11)
RDW: 12.7 % (ref 11.5–15.5)
WBC: 6.8 10*3/uL (ref 4.0–10.5)

## 2016-02-04 LAB — URINALYSIS, ROUTINE W REFLEX MICROSCOPIC
Bilirubin Urine: NEGATIVE
Glucose, UA: NEGATIVE mg/dL
Hgb urine dipstick: NEGATIVE
KETONES UR: NEGATIVE mg/dL
LEUKOCYTES UA: NEGATIVE
Nitrite: NEGATIVE
PROTEIN: NEGATIVE mg/dL
Specific Gravity, Urine: 1.015 (ref 1.005–1.030)
pH: 7 (ref 5.0–8.0)

## 2016-02-04 LAB — COMPREHENSIVE METABOLIC PANEL
ALK PHOS: 41 U/L (ref 38–126)
ALT: 27 U/L (ref 14–54)
AST: 26 U/L (ref 15–41)
Albumin: 4 g/dL (ref 3.5–5.0)
Anion gap: 13 (ref 5–15)
BILIRUBIN TOTAL: 0.8 mg/dL (ref 0.3–1.2)
BUN: 14 mg/dL (ref 6–20)
CALCIUM: 9.7 mg/dL (ref 8.9–10.3)
CO2: 24 mmol/L (ref 22–32)
CREATININE: 0.84 mg/dL (ref 0.44–1.00)
Chloride: 103 mmol/L (ref 101–111)
Glucose, Bld: 90 mg/dL (ref 65–99)
Potassium: 3.8 mmol/L (ref 3.5–5.1)
Sodium: 140 mmol/L (ref 135–145)
TOTAL PROTEIN: 7.1 g/dL (ref 6.5–8.1)

## 2016-02-04 LAB — LIPASE, BLOOD: LIPASE: 29 U/L (ref 11–51)

## 2016-02-04 MED ORDER — DICYCLOMINE HCL 10 MG PO CAPS
20.0000 mg | ORAL_CAPSULE | Freq: Once | ORAL | Status: AC
  Administered 2016-02-04: 20 mg via ORAL
  Filled 2016-02-04: qty 2

## 2016-02-04 MED ORDER — DICYCLOMINE HCL 20 MG PO TABS: 20.0000 mg | ORAL_TABLET | Freq: Two times a day (BID) | ORAL | Status: DC

## 2016-02-04 MED ORDER — ONDANSETRON 4 MG PO TBDP: ORAL_TABLET | ORAL | Status: DC

## 2016-02-04 MED ORDER — ONDANSETRON 4 MG PO TBDP
4.0000 mg | ORAL_TABLET | Freq: Once | ORAL | Status: AC
  Administered 2016-02-04: 4 mg via ORAL
  Filled 2016-02-04: qty 1

## 2016-02-04 NOTE — ED Provider Notes (Signed)
CSN: [id number]     Arrival date & time 02/04/16  1159 History   First MD Initiated Contact with Patient 02/04/16 1735     Chief Complaint  Patient presents with  . Nausea  . Diarrhea     (Consider location/radiation/quality/duration/timing/severity/associated sxs/prior Treatment) HPI  30 year old female presents with 2 weeks of continuous nausea and loose stools. Patient states that her stools are looser than normal but are not watery. There is no blood. Most commonly occurs during or immediately after eating. Is continuously nauseated. No vomiting. Has been having continuous abdominal pain as well, she states is diffuse but mostly right-sided. Pain is not significant he worsen after eating. Pain is described as an achiness and dull sensation. Has tried ibuprofen and Tylenol with no relief. Patient states she has a history of a prior ulcer but this feels different.  Past Medical History  Diagnosis Date  . No pertinent past medical history   . Anxiety   . GERD (gastroesophageal reflux disease)   . Depression   . Panic attack   . Chicken pox    Past Surgical History  Procedure Laterality Date  . Tonsillectomy    . Knee surgery    . Knee arthroscopy      R knee   Family History  Problem Relation Age of Onset  . Diabetes Mother   . Thyroid disease Mother   . Heart attack Father   . Diabetes Maternal Aunt   . Diabetes Maternal Uncle   . COPD Maternal Grandmother   . Crohn's disease Maternal Grandmother   . Anesthesia problems Neg Hx   . Hypotension Neg Hx   . Malignant hyperthermia Neg Hx   . Pseudochol deficiency Neg Hx    Social History  Substance Use Topics  . Smoking status: Former Smoker -- 0.25 packs/day for 2 years    Quit date: 09/20/2008  . Smokeless tobacco: Never Used  . Alcohol Use: Yes     Comment: occasional   OB History    Gravida Para Term Preterm AB TAB SAB Ectopic Multiple Living   2 1 1  1  1   1      Review of Systems  Constitutional: Negative  for fever.  Cardiovascular: Negative for chest pain.  Gastrointestinal: Positive for nausea, abdominal pain and diarrhea. Negative for vomiting, constipation and blood in stool.  Genitourinary: Negative for dysuria, vaginal discharge and menstrual problem.  Musculoskeletal: Negative for back pain.  All other systems reviewed and are negative.     Allergies  Remeron; Metronidazole; and Tramadol  Home Medications   Prior to Admission medications   Not on File   BP 111/67 mmHg  Pulse 65  Temp(Src) 98.7 F (37.1 C) (Oral)  Resp 16  SpO2 100% Physical Exam  Constitutional: She is oriented to person, place, and time. She appears well-developed and well-nourished. No distress.  HENT:  Head: Normocephalic and atraumatic.  Right Ear: External ear normal.  Left Ear: External ear normal.  Nose: Nose normal.  Mouth/Throat: Oropharynx is clear and moist. No oropharyngeal exudate.  Eyes: Right eye exhibits no discharge. Left eye exhibits no discharge.  Cardiovascular: Normal rate, regular rhythm and normal heart sounds.   Pulmonary/Chest: Effort normal and breath sounds normal.  Abdominal: Soft. There is tenderness (mild) in the right upper quadrant.  Neurological: She is alert and oriented to person, place, and time.  Skin: Skin is warm and dry. She is not diaphoretic.  Nursing note and vitals reviewed.   ED  Course  Procedures (including critical care time) Labs Review Labs Reviewed  LIPASE, BLOOD  COMPREHENSIVE METABOLIC PANEL  CBC  URINALYSIS, ROUTINE W REFLEX MICROSCOPIC (NOT AT [facility])  I-STAT BETA HCG BLOOD, ED ([facility], [facility], [facility] ONLY)    Imaging Review US Abdomen Limited  02/04/2016  CLINICAL DATA:  Right upper quadrant pain for 2 weeks.  Nausea. EXAM: US ABDOMEN LIMITED - RIGHT UPPER QUADRANT COMPARISON:  CT 10/04/2011 FINDINGS: Gallbladder: Physiologically distended. No gallstones or wall thickening visualized. No sonographic Murphy sign noted by sonographer. Common bile duct:  Diameter: 4 mm. Liver: No focal lesion identified. Within normal limits in parenchymal echogenicity. Normal directional flow in the main portal vein. IMPRESSION: Normal right upper quadrant ultrasound. Electronically Signed   By: Rubye Oaks M.D.   On: 02/04/2016 19:44   I have personally reviewed and evaluated these images and lab results as part of my medical decision-making.   EKG Interpretation None      MDM   Final diagnoses:  Abdominal pain, unspecified abdominal location  Diarrhea, unspecified type    Patient's presentation and workup are unremarkable. Vital signs are unremarkable. Ultrasound obtained given mild focal right upper quadrant tenderness but this is negative. Highly doubt pelvic or emergent abdominal pathology such as appendicitis. Possibly could be IBS. Will treat with Bentyl, Zofran, and recommend GI follow-up as an outpatient.     Pricilla Loveless, MD 02/05/16 [phone]

## 2016-02-04 NOTE — ED Notes (Signed)
Pt A&OX4, ambulatory at d/c with steady gait, NAD 

## 2016-02-04 NOTE — ED Notes (Signed)
Pt reports nausea and diarrhea with abdominal pain for several weeks.

## 2016-02-04 NOTE — ED Notes (Signed)
Pt reports she has been experiencing nausea and loose stools. She states her stools are not "watery" and are formed but as soon as she eats she has to go to the bathroom. Last BM was today and was formed. She has not vomited but only feels nauseous.

## 2016-02-06 ENCOUNTER — Ambulatory Visit (INDEPENDENT_AMBULATORY_CARE_PROVIDER_SITE_OTHER): Payer: PRIVATE HEALTH INSURANCE | Admitting: Family

## 2016-02-06 ENCOUNTER — Encounter: Payer: Self-pay | Admitting: Family

## 2016-02-06 VITALS — BP 124/62 | HR 73 | Temp 98.1°F | Ht 68.0 in | Wt 187.0 lb

## 2016-02-06 DIAGNOSIS — R195 Other fecal abnormalities: Secondary | ICD-10-CM | POA: Insufficient documentation

## 2016-02-06 NOTE — Patient Instructions (Signed)
Thank you for choosing [facility].  Summary/Instructions:  Your previous blood work all looks good.   Please try increasing your fiber intake and work on a probiotic.   If your symptoms worsen or fail to improve, please contact our office for further instruction, or in case of emergency go directly to the emergency room at the closest medical facility.

## 2016-02-06 NOTE — Progress Notes (Signed)
Pre visit review using our clinic review tool, if applicable. No additional management support is needed unless otherwise documented below in the visit note. 

## 2016-02-06 NOTE — Progress Notes (Signed)
   Subjective:    Patient ID: Krystal Humphrey, female    DOB: [birth year], 30 y.o.   MRN: [id number]  Chief Complaint  Patient presents with  . GI Problem    Pt has noticed an increased amount of times her bowels are looser then usual but not diarrhea    HPI:  Krystal Humphrey is a 30 y.o. female who  has a past medical history of No pertinent past medical history; Anxiety; GERD (gastroesophageal reflux disease); Depression; Panic attack; and Chicken pox. and presents today for a n acute office visit.   This is a new problem. Associated symptom of increased bowel movements has been going on for a couple of weeks. Frequency is dependent upon the foods that she consumes. Notes that she has more frequent looser bowel movements with high fat content foods. Does have occasional nausea with no vomiting or blood in stools. No fevers. Recently seen in the emergency department for similar symptoms with blood work being negative.Did have right upper quadrant discomfort which was further examined through a limited ultrasound which was normal with no evidence of gallbladder issues. Potential for IBS and was treated with Bentyl and Zofran. ED records and labs were reviewed in detail.   Allergies  Allergen Reactions  . Remeron [Mirtazapine] Anaphylaxis  . Metronidazole Nausea And Vomiting  . Tramadol Itching     No current outpatient prescriptions on file prior to visit.   No current facility-administered medications on file prior to visit.     Review of Systems  Constitutional: Negative for fever and chills.  Gastrointestinal: Positive for nausea (occasional) and abdominal pain. Negative for vomiting, diarrhea, constipation and blood in stool.      Objective:    BP 124/62 mmHg  Pulse 73  Temp(Src) 98.1 F (36.7 C) (Oral)  Ht 5\' 8"  (1.727 m)  Wt 187 lb (84.823 kg)  BMI 28.44 kg/m2  SpO2 98% Nursing note and vital signs reviewed.  Physical Exam  Constitutional: She is oriented to person,  place, and time. She appears well-developed and well-nourished. No distress.  Cardiovascular: Normal rate, regular rhythm, normal heart sounds and intact distal pulses.   Pulmonary/Chest: Effort normal and breath sounds normal.  Abdominal: Soft. Normal appearance and bowel sounds are normal. She exhibits no mass. There is no hepatosplenomegaly. There is tenderness in the right upper quadrant. There is no rigidity, no rebound, no guarding, no tenderness at McBurney's point and negative Murphy's sign.  Neurological: She is alert and oriented to person, place, and time.  Skin: Skin is warm and dry.  Psychiatric: She has a normal mood and affect. Her behavior is normal. Judgment and thought content normal.       Assessment & Plan:   Problem List Items Addressed This Visit      Other   Loose stools - Primary    Loose stools in the presence of negative ultrasound for gallstones or gallbladder related issues appears related to nutritional intake and possible fat intolerance. Encouraged to increase fiber and start a probiotic. Follow-up if symptoms worsen or fail to improve for further imaging and possible referral to gastroenterology if necessary.

## 2016-02-06 NOTE — Assessment & Plan Note (Signed)
Loose stools in the presence of negative ultrasound for gallstones or gallbladder related issues appears related to nutritional intake and possible fat intolerance. Encouraged to increase fiber and start a probiotic. Follow-up if symptoms worsen or fail to improve for further imaging and possible referral to gastroenterology if necessary.

## 2016-03-11 ENCOUNTER — Encounter: Payer: Self-pay | Admitting: Gastroenterology

## 2016-05-30 ENCOUNTER — Emergency Department (HOSPITAL_COMMUNITY): Payer: Medicaid Other

## 2016-05-30 ENCOUNTER — Encounter (HOSPITAL_COMMUNITY): Payer: Self-pay | Admitting: Emergency Medicine

## 2016-05-30 ENCOUNTER — Emergency Department (HOSPITAL_COMMUNITY)
Admission: EM | Admit: 2016-05-30 | Discharge: 2016-05-30 | Disposition: A | Discharge status: Home / Self Care | Payer: Medicaid Other | Attending: Emergency Medicine | Admitting: Emergency Medicine

## 2016-05-30 DIAGNOSIS — S6991XA Unspecified injury of right wrist, hand and finger(s), initial encounter: Secondary | ICD-10-CM | POA: Diagnosis present

## 2016-05-30 DIAGNOSIS — S62318A Displaced fracture of base of other metacarpal bone, initial encounter for closed fracture: Secondary | ICD-10-CM

## 2016-05-30 DIAGNOSIS — Z87891 Personal history of nicotine dependence: Secondary | ICD-10-CM | POA: Diagnosis not present

## 2016-05-30 DIAGNOSIS — Y999 Unspecified external cause status: Secondary | ICD-10-CM | POA: Insufficient documentation

## 2016-05-30 DIAGNOSIS — Y939 Activity, unspecified: Secondary | ICD-10-CM | POA: Diagnosis not present

## 2016-05-30 DIAGNOSIS — W228XXA Striking against or struck by other objects, initial encounter: Secondary | ICD-10-CM | POA: Diagnosis not present

## 2016-05-30 DIAGNOSIS — Y929 Unspecified place or not applicable: Secondary | ICD-10-CM | POA: Diagnosis not present

## 2016-05-30 DIAGNOSIS — S62316A Displaced fracture of base of fifth metacarpal bone, right hand, initial encounter for closed fracture: Secondary | ICD-10-CM | POA: Insufficient documentation

## 2016-05-30 MED ORDER — HYDROCODONE-ACETAMINOPHEN 5-325 MG PO TABS
1.0000 | ORAL_TABLET | Freq: Once | ORAL | Status: AC
  Administered 2016-05-30: 1 via ORAL
  Filled 2016-05-30: qty 1

## 2016-05-30 MED ORDER — IBUPROFEN 800 MG PO TABS: 800.0000 mg | ORAL_TABLET | Freq: Three times a day (TID) | ORAL | Status: DC

## 2016-05-30 NOTE — ED Notes (Signed)
Pt. punched a wall this evening , presents with right hand pain /mild swelling .

## 2016-05-30 NOTE — Progress Notes (Signed)
Orthopedic Tech Progress Note Patient Details:  Krystal AugustBrandi Humphrey 1985-12-30 [id number] Applied fiberglass ulnar gutter splint to RUE.  Pulses, sensation, motion intact before and after splinting.  Capillary refill less than 2 seconds before and after splinting.  Placed splinted RUE in arm sling. Ortho Devices Type of Ortho Device: Ulna gutter splint, Arm sling Ortho Device/Splint Location: RUE Ortho Device/Splint Interventions: Application   Lesle ChrisGilliland, Gavin Telford L 05/30/2016, 10:36 PM

## 2016-05-30 NOTE — ED Provider Notes (Signed)
CSN: [id number]     Arrival date & time 05/30/16  2108 History  By signing my name below, I, Bridgette HabermannMaria Tan, attest that this documentation has been prepared under the direction and in the presence of Errol Ala, PA-C. Electronically Signed: Bridgette HabermannMaria Tan, ED Scribe. 05/30/2016. 10:00 PM.     Chief Complaint  Patient presents with  . Hand Injury   Patient is a 30 y.o. female presenting with hand injury. The history is provided by the patient. No language interpreter was used.  Hand Injury Location:  Hand Injury: yes   Mechanism of injury comment:  Punch Hand location:  Dorsum of R hand Pain details:    Quality:  Aching   Radiates to:  Does not radiate   Severity:  Severe   Onset quality:  Sudden   Timing:  Constant   Progression:  Unchanged Chronicity:  New Dislocation: no   Foreign body present:  No foreign bodies Relieved by:  None tried Worsened by:  Movement Associated symptoms: decreased range of motion and numbness   Associated symptoms: no fever and no swelling     HPI Comments: Janann AugustBrandi Tadlock is a 30 y.o. female who presents to the Emergency Department complaining of constant right hand pain and mild swelling. Pt reports that she punched a door frame this evening because she got angry. Her pain is over the dorsal surface of her hand at the right 4th and 5th MCP joints. Pain is exacerbated by movement in the fingers and flexion of the wrist. Pt states she has associated mild numbness at the 5th MCP. Denies open wounds to the hand. She has not taken any medications PTA. [facility] Pt denies any additional injuries. No other complaints today.   Past Medical History  Diagnosis Date  . No pertinent past medical history   . Anxiety   . GERD (gastroesophageal reflux disease)   . Depression   . Panic attack   . Chicken pox    Past Surgical History  Procedure Laterality Date  . Tonsillectomy    . Knee surgery    . Knee arthroscopy      R  knee   Family History  Problem Relation Age of Onset  . Diabetes Mother   . Thyroid disease Mother   . Heart attack Father   . Diabetes Maternal Aunt   . Diabetes Maternal Uncle   . COPD Maternal Grandmother   . Crohn's disease Maternal Grandmother   . Anesthesia problems Neg Hx   . Hypotension Neg Hx   . Malignant hyperthermia Neg Hx   . Pseudochol deficiency Neg Hx    Social History  Substance Use Topics  . Smoking status: Former Smoker -- 0.25 packs/day for 2 years    Quit date: 09/20/2008  . Smokeless tobacco: Never Used  . Alcohol Use: Yes     Comment: occasional   OB History    Gravida Para Term Preterm AB TAB SAB Ectopic Multiple Living   2 1 1  1  1   1      Review of Systems  Constitutional: Negative for fever.  Musculoskeletal: Positive for arthralgias.  All other systems reviewed and are negative.   Allergies  Remeron; Metronidazole; and Tramadol  Home Medications   Prior to Admission medications   Medication Sig Start Date End Date Taking? Authorizing Provider  ibuprofen (ADVIL,MOTRIN) 800 MG tablet Take 1 tablet (800 mg total) by mouth 3 (three) times daily. 05/30/16  Ohana Birdwell, PA-C   BP 138/95 mmHg  Pulse 114  Temp(Src) 98.6 F (37 C)  Resp 20  Ht 5\' 8"  (1.727 m)  Wt 89.444 kg  BMI 29.99 kg/m2  SpO2 99% Physical Exam  Constitutional: She appears well-developed and well-nourished. No distress.  Crying  HENT:  Head: Normocephalic and atraumatic.  Right Ear: External ear normal.  Left Ear: External ear normal.  Eyes: Conjunctivae are normal. Right eye exhibits no discharge. Left eye exhibits no discharge. No scleral icterus.  Neck: Normal range of motion.  Cardiovascular: Normal rate.   Cap refill less than 2 seconds  Pulmonary/Chest: Effort normal.  Musculoskeletal:       Right hand: She exhibits tenderness and swelling. She exhibits normal range of motion, normal capillary refill and no deformity. Normal sensation noted.        Hands: Tenderness to palpation over the right fourth and fifth MCP joints and over the fifth metacarpal. Full range of motion of the fingers and wrist though this is painful. No obvious deformities. Minimal soft tissue swelling. No open wounds. No tenderness to palpation of the right wrist or forearm. Moves all remaining extremities spontaneously.  Neurological: She is alert. Coordination normal.  Deferred strength testing secondary to pain. Sensation to light touch intact throughout.  Skin: Skin is warm and dry.  Psychiatric: She has a normal mood and affect. Her behavior is normal.  Nursing note and vitals reviewed.   ED Course  Procedures  DIAGNOSTIC STUDIES: Oxygen Saturation is 99% on RA, normal by my interpretation.    COORDINATION OF CARE: 10:00 PM Discussed treatment plan with pt at bedside which includes a dose of Vicodin and splint placement and pt agreed to plan.  Imaging Review Dg Hand Complete Right  05/30/2016  CLINICAL DATA:  Pain at the right fourth and fifth metacarpals after punching a wall. Initial encounter. EXAM: RIGHT HAND - COMPLETE 3+ VIEW COMPARISON:  None. FINDINGS: There is question of a nondisplaced fracture through the base of the fifth metacarpal. Soft tissue swelling is noted about the ulnar and dorsal aspects of the hand and wrist. Negative ulnar variance is noted. The carpal rows appear grossly intact, and demonstrate normal alignment. IMPRESSION: Question of nondisplaced fracture through the base of the fifth metacarpal. Electronically Signed   By: Roanna RaiderJeffery  Chang M.D.   On: 05/30/2016 21:45   I have personally reviewed and evaluated these images as part of my medical decision-making.   MDM   Final diagnoses:  Fracture of fifth metacarpal bone, closed, initial encounter   Patient presenting with pain to right hand after punching a wall. Right hand and fingers are neurovascularly intact with FROM. No obvious deformity. TTP of the 4th and 5th MCPs and 5th  metacarpal. Patient X-Ray questions a non-displaced fracture at 5th metacarpal base. Pain managed in ED with vicodin. Splint applied. Pt instructed to schedule a follow up appointment with her orthopedist. Discussed RICE therapy and other conservative pain control measures. Patient is stable for discharge home & is agreeable with above plan. I have also discussed reasons to return immediately to the ER. Patient expresses understanding and agrees with plan.  I personally performed the services described in this documentation, which was scribed in my presence. The recorded information has been reviewed and is accurate.    Rolm GalaStevi Diani Jillson, PA-C 05/30/16 2247  Alvira MondayErin Schlossman, MD 06/01/16 [phone]

## 2016-05-30 NOTE — Discharge Instructions (Signed)
Metacarpal Fracture °A metacarpal fracture is a break (fracture) of a bone in the hand. Metacarpals are the bones that extend from your knuckles to your wrist. In each hand, you have five metacarpal bones that connect your fingers and your thumb to your wrist. °Some hand fractures have bone pieces that are close together and stable (simple). These fractures may be treated with only a splint or cast. Hand fractures that have many pieces of broken bone (comminuted), unstable bone pieces (displaced), or a bone that breaks through the skin (compound) usually require surgery. °CAUSES °This injury may be caused by: °· A fall. °· A hard, direct hit to your hand. °· An injury that squeezes your knuckle, stretches your finger out of place, or crushes your hand. °RISK FACTORS °This injury is more likely to occur if: °· You play contact sports. °· You have certain bone diseases. °SYMPTOMS  °Symptoms of this type of fracture develop soon after the injury. Symptoms may include: °· Swelling. °· Pain. °· Stiffness. °· Increased pain with movement. °· Bruising. °· Inability to move a finger. °· A shortened finger. °· A finger knuckle that looks sunken in. °· Unusual appearance of the hand or finger (deformity). °DIAGNOSIS  °This injury may be diagnosed based on your signs and symptoms, especially if you had a recent hand injury. Your health care provider will perform a physical exam. He or she may also order X-rays to confirm the diagnosis.  °TREATMENT  °Treatment for this injury depends on the type of fracture you have and how severe it is. Possible treatments include: °· Non-reduction. This can be done if the bone does not need to be moved back into place. The fracture can be casted or splinted as it is.   °· Closed reduction. If your bone is stable and can be moved back into place, you may only need to wear a cast or splint or have buddy taping. °· Closed reduction with internal fixation (CRIF). This is the most common  treatment. You may have this procedure if your bone can be moved back into place but needs more support. Wires, pins, or screws may be inserted through your skin to stabilize the fracture. °· Open reduction with internal fixation (ORIF). This may be needed if your fracture is severe and unstable. It involves surgery to move your bone back into the right position. Screws, wires, or plates are used to stabilize the fracture. °After all procedures, you may need to wear a cast or a splint for several weeks. You will also need to have follow-up X-rays to make sure that the bone is healing well and staying in position. After you no longer need your cast or splint, you may need physical therapy. This will help you to regain full movement and strength in your hand.  °HOME CARE INSTRUCTIONS  °If You Have a Cast: °· Do not stick anything inside the cast to scratch your skin. Doing that increases your risk of infection. °· Check the skin around the cast every day. Report any concerns to your health care provider. You may put lotion on dry skin around the edges of the cast. Do not apply lotion to the skin underneath the cast. °If You Have a Splint: °· Wear it as directed by your health care provider. Remove it only as directed by your health care provider. °· Loosen the splint if your fingers become numb and tingle, or if they turn cold and blue. °Bathing °· Cover the cast or splint with a   watertight plastic bag to protect it from water while you take a bath or a shower. Do not let the cast or splint get wet. °Managing Pain, Stiffness, and Swelling °· If directed, apply ice to the injured area (if you have a splint, not a cast): °¨ Put ice in a plastic bag. °¨ Place a towel between your skin and the bag. °¨ Leave the ice on for 20 minutes, 2-3 times a day. °· Move your fingers often to avoid stiffness and to lessen swelling. °· Raise the injured area above the level of your heart while you are sitting or lying  down. °Driving °· Do not drive or operate heavy machinery while taking pain medicine. °· Do not drive while wearing a cast or splint on a hand that you use for driving. °Activity °· Return to your normal activities as directed by your health care provider. Ask your health care provider what activities are safe for you. °General Instructions °· Do not put pressure on any part of the cast or splint until it is fully hardened. This may take several hours. °· Keep the cast or splint clean and dry. °· Do not use any tobacco products, including cigarettes, chewing tobacco, or electronic cigarettes. Tobacco can delay bone healing. If you need help quitting, ask your health care provider. °· Take medicines only as directed by your health care provider. °· Keep all follow-up visits as directed by your health care provider. This is important. °SEEK MEDICAL CARE IF:  °· Your pain is getting worse. °· You have redness, swelling, or pain in the injured area.   °· You have fluid, blood, or pus coming from under your cast or splint.   °· You notice a bad smell coming from under your cast or splint.   °· You have a fever.   °SEEK IMMEDIATE MEDICAL CARE IF:  °· You develop a rash.   °· You have trouble breathing.   °· Your skin or nails on your injured hand turn blue or gray even after you loosen your splint. °· Your injured hand feels cold or becomes numb even after you loosen your splint.   °· You develop severe pain under the cast or in your hand. °  °This information is not intended to replace advice given to you by your health care provider. Make sure you discuss any questions you have with your health care provider. °  °Document Released: 11/15/2005 Document Revised: 08/06/2015 Document Reviewed: 09/04/2014 °Elsevier Interactive Patient Education ©2016 [employer] ° °

## 2016-07-26 ENCOUNTER — Encounter (HOSPITAL_COMMUNITY): Payer: Self-pay

## 2016-07-26 DIAGNOSIS — Z87891 Personal history of nicotine dependence: Secondary | ICD-10-CM | POA: Diagnosis not present

## 2016-07-26 DIAGNOSIS — I880 Nonspecific mesenteric lymphadenitis: Secondary | ICD-10-CM | POA: Insufficient documentation

## 2016-07-26 DIAGNOSIS — R1011 Right upper quadrant pain: Secondary | ICD-10-CM | POA: Diagnosis not present

## 2016-07-26 DIAGNOSIS — N200 Calculus of kidney: Secondary | ICD-10-CM | POA: Diagnosis not present

## 2016-07-26 LAB — URINE MICROSCOPIC-ADD ON

## 2016-07-26 LAB — URINALYSIS, ROUTINE W REFLEX MICROSCOPIC
Bilirubin Urine: NEGATIVE
GLUCOSE, UA: NEGATIVE mg/dL
KETONES UR: 40 mg/dL — AB
LEUKOCYTES UA: NEGATIVE
NITRITE: NEGATIVE
PH: 5.5 (ref 5.0–8.0)
Protein, ur: NEGATIVE mg/dL
Specific Gravity, Urine: 1.02 (ref 1.005–1.030)

## 2016-07-26 LAB — COMPREHENSIVE METABOLIC PANEL
ALT: 35 U/L (ref 14–54)
ANION GAP: 12 (ref 5–15)
AST: 34 U/L (ref 15–41)
Albumin: 4.5 g/dL (ref 3.5–5.0)
Alkaline Phosphatase: 68 U/L (ref 38–126)
BILIRUBIN TOTAL: 1.4 mg/dL — AB (ref 0.3–1.2)
BUN: 14 mg/dL (ref 6–20)
CHLORIDE: 104 mmol/L (ref 101–111)
CO2: 20 mmol/L — ABNORMAL LOW (ref 22–32)
Calcium: 9.4 mg/dL (ref 8.9–10.3)
Creatinine, Ser: 0.77 mg/dL (ref 0.44–1.00)
Glucose, Bld: 91 mg/dL (ref 65–99)
POTASSIUM: 4 mmol/L (ref 3.5–5.1)
Sodium: 136 mmol/L (ref 135–145)
TOTAL PROTEIN: 7.4 g/dL (ref 6.5–8.1)

## 2016-07-26 LAB — CBC
HEMATOCRIT: 45.3 % (ref 36.0–46.0)
HEMOGLOBIN: 15.7 g/dL — AB (ref 12.0–15.0)
MCH: 31.7 pg (ref 26.0–34.0)
MCHC: 34.7 g/dL (ref 30.0–36.0)
MCV: 91.3 fL (ref 78.0–100.0)
Platelets: 256 10*3/uL (ref 150–400)
RBC: 4.96 MIL/uL (ref 3.87–5.11)
RDW: 12.9 % (ref 11.5–15.5)
WBC: 12.3 10*3/uL — AB (ref 4.0–10.5)

## 2016-07-26 LAB — LIPASE, BLOOD: LIPASE: 18 U/L (ref 11–51)

## 2016-07-26 MED ORDER — FENTANYL CITRATE (PF) 100 MCG/2ML IJ SOLN
50.0000 ug | INTRAMUSCULAR | Status: DC | PRN
  Administered 2016-07-26: 50 ug via INTRAVENOUS

## 2016-07-26 MED ORDER — FENTANYL CITRATE (PF) 100 MCG/2ML IJ SOLN
INTRAMUSCULAR | Status: DC
Start: 2016-07-26 — End: 2016-07-27
  Filled 2016-07-26: qty 2

## 2016-07-26 MED ORDER — ONDANSETRON 4 MG PO TBDP
4.0000 mg | ORAL_TABLET | Freq: Once | ORAL | Status: AC | PRN
  Administered 2016-07-26: 4 mg via ORAL

## 2016-07-26 MED ORDER — ONDANSETRON 4 MG PO TBDP
ORAL_TABLET | ORAL | Status: AC
  Filled 2016-07-26: qty 1

## 2016-07-26 NOTE — ED Triage Notes (Signed)
Pt complaining of R upper abdominal pain since this evening. Pt states pain does not radiate. Pt states diarrhea and nausea. No vomiting.

## 2016-07-27 ENCOUNTER — Emergency Department (HOSPITAL_COMMUNITY): Payer: Medicaid Other

## 2016-07-27 ENCOUNTER — Emergency Department (HOSPITAL_COMMUNITY)
Admission: EM | Admit: 2016-07-27 | Discharge: 2016-07-27 | Disposition: A | Discharge status: Home / Self Care | Payer: Medicaid Other | Attending: Emergency Medicine | Admitting: Emergency Medicine

## 2016-07-27 ENCOUNTER — Encounter (HOSPITAL_COMMUNITY): Payer: Self-pay | Admitting: Radiology

## 2016-07-27 DIAGNOSIS — N200 Calculus of kidney: Secondary | ICD-10-CM

## 2016-07-27 DIAGNOSIS — R1011 Right upper quadrant pain: Secondary | ICD-10-CM

## 2016-07-27 DIAGNOSIS — I880 Nonspecific mesenteric lymphadenitis: Secondary | ICD-10-CM

## 2016-07-27 LAB — PREGNANCY, URINE: Preg Test, Ur: NEGATIVE

## 2016-07-27 MED ORDER — METOCLOPRAMIDE HCL 10 MG PO TABS: 10.0000 mg | ORAL_TABLET | Freq: Four times a day (QID) | ORAL | 0 refills | Status: DC

## 2016-07-27 MED ORDER — HYDROMORPHONE HCL 1 MG/ML IJ SOLN
1.0000 mg | Freq: Once | INTRAMUSCULAR | Status: AC
  Administered 2016-07-27: 1 mg via INTRAVENOUS
  Filled 2016-07-27: qty 1

## 2016-07-27 MED ORDER — ONDANSETRON HCL 4 MG/2ML IJ SOLN
4.0000 mg | Freq: Once | INTRAMUSCULAR | Status: AC
  Administered 2016-07-27: 4 mg via INTRAVENOUS
  Filled 2016-07-27: qty 2

## 2016-07-27 MED ORDER — ONDANSETRON 4 MG PO TBDP: 4.0000 mg | ORAL_TABLET | Freq: Three times a day (TID) | ORAL | 0 refills | Status: DC | PRN

## 2016-07-27 MED ORDER — METOCLOPRAMIDE HCL 5 MG/ML IJ SOLN
10.0000 mg | Freq: Once | INTRAMUSCULAR | Status: AC
  Administered 2016-07-27: 10 mg via INTRAVENOUS
  Filled 2016-07-27: qty 2

## 2016-07-27 MED ORDER — SODIUM CHLORIDE 0.9 % IV BOLUS (SEPSIS)
1000.0000 mL | Freq: Once | INTRAVENOUS | Status: AC
  Administered 2016-07-27: 1000 mL via INTRAVENOUS

## 2016-07-27 MED ORDER — KETOROLAC TROMETHAMINE 30 MG/ML IJ SOLN
30.0000 mg | Freq: Once | INTRAMUSCULAR | Status: AC
  Administered 2016-07-27: 30 mg via INTRAVENOUS
  Filled 2016-07-27: qty 1

## 2016-07-27 MED ORDER — IOPAMIDOL (ISOVUE-300) INJECTION 61%
INTRAVENOUS | Status: AC
  Administered 2016-07-27: 100 mL
  Filled 2016-07-27: qty 100

## 2016-07-27 MED ORDER — OXYCODONE-ACETAMINOPHEN 5-325 MG PO TABS: 1.0000 | ORAL_TABLET | Freq: Four times a day (QID) | ORAL | 0 refills | Status: DC | PRN

## 2016-07-27 MED ORDER — MORPHINE SULFATE (PF) 4 MG/ML IV SOLN
4.0000 mg | Freq: Once | INTRAVENOUS | Status: AC
  Administered 2016-07-27: 4 mg via INTRAVENOUS
  Filled 2016-07-27: qty 1

## 2016-07-27 NOTE — Discharge Instructions (Addendum)
You have been seen today for abdominal pain. Your imaging showed evidence of mesenteric adenitis, which is an inflammation around the bowel. This is likely due to a viral infection. This means that antibiotics are not indicated at this time. Treatment is symptomatic care, to include treating pain and nausea, as well as staying hydrated. You should be drinking one half to one full liter of water an hour.  There was also a kidney stone noted on the left. This will need to be evaluated by urology. Call the number provided to set up an appointment. Follow up with PCP for reevaluation of the mesenteric adenitis. Return to ED should symptoms worsen. Use the Percocet for severe pain. Do not drive or perform other dangerous activities while taking the Percocet. Use the Zofran or Reglan for nausea and vomiting. If one does not work, and you may take the other. Do not take them at the same time.

## 2016-07-27 NOTE — ED Provider Notes (Signed)
Krystal Humphrey is a 30 y.o. female, with a history of GERD, presenting to the ED with right upper quadrant abdominal pain beginning last night around 7 PM.  HPI from Antony MaduraKelly Humes, PA-C: "30 year old female with a history of anxiety, esophageal reflux, and depression presents to the emergency department for acute onset of right upper quadrant abdominal pain which began at 1900 tonight. Patient reports a constant pain which is waxing and waning in severity. It is sharp and present in her right upper abdomen and epigastric region. She notes worsening pain with deep breathing. She has had some nausea as well as looser bowel movements. She states that she took ibuprofen for pain prior to arrival without relief. She has had no fever, vomiting, vaginal complaints, or urinary symptoms. No history of abdominal surgeries. No reported sick contacts."  Past Medical History:  Diagnosis Date  . Anxiety   . Chicken pox   . Depression   . GERD (gastroesophageal reflux disease)   . No pertinent past medical history   . Panic attack     Physical Exam  BP 123/69   Pulse 81   Temp 99.4 F (37.4 C)   Resp 20   Ht 5\' 8"  (1.727 m)   Wt 88.2 kg   SpO2 97%   BMI 29.57 kg/m   Physical Exam  Constitutional: She appears well-developed and well-nourished. No distress.  HENT:  Head: Normocephalic and atraumatic.  Eyes: Conjunctivae are normal.  Neck: Neck supple.  Cardiovascular: Normal rate, regular rhythm, normal heart sounds and intact distal pulses.   Pulmonary/Chest: Effort normal and breath sounds normal. No respiratory distress.  Abdominal: Soft. There is tenderness in the right upper quadrant and epigastric area. There is no guarding.  Musculoskeletal: She exhibits no edema or tenderness.  Lymphadenopathy:    She has no cervical adenopathy.  Neurological: She is alert.  Skin: Skin is warm and dry. She is not diaphoretic.  Psychiatric: She has a normal mood and affect. Her behavior is normal.   Nursing note and vitals reviewed.   ED Course  Procedures   Results for orders placed or performed during the hospital encounter of 07/27/16  Lipase, blood  Result Value Ref Range   Lipase 18 11 - 51 U/L  Comprehensive metabolic panel  Result Value Ref Range   Sodium 136 135 - 145 mmol/L   Potassium 4.0 3.5 - 5.1 mmol/L   Chloride 104 101 - 111 mmol/L   CO2 20 (L) 22 - 32 mmol/L   Glucose, Bld 91 65 - 99 mg/dL   BUN 14 6 - 20 mg/dL   Creatinine, Ser 9.140.77 0.44 - 1.00 mg/dL   Calcium 9.4 8.9 - 78.210.3 mg/dL   Total Protein 7.4 6.5 - 8.1 g/dL   Albumin 4.5 3.5 - 5.0 g/dL   AST 34 15 - 41 U/L   ALT 35 14 - 54 U/L   Alkaline Phosphatase 68 38 - 126 U/L   Total Bilirubin 1.4 (H) 0.3 - 1.2 mg/dL   GFR calc non Af Amer >60 >60 mL/min   GFR calc Af Amer >60 >60 mL/min   Anion gap 12 5 - 15  CBC  Result Value Ref Range   WBC 12.3 (H) 4.0 - 10.5 K/uL   RBC 4.96 3.87 - 5.11 MIL/uL   Hemoglobin 15.7 (H) 12.0 - 15.0 g/dL   HCT 95.645.3 21.336.0 - 08.646.0 %   MCV 91.3 78.0 - 100.0 fL   MCH 31.7 26.0 - 34.0 pg  MCHC 34.7 30.0 - 36.0 g/dL   RDW 62.1 30.8 - 65.7 %   Platelets 256 150 - 400 K/uL  Urinalysis, Routine w reflex microscopic  Result Value Ref Range   Color, Urine YELLOW YELLOW   APPearance CLEAR CLEAR   Specific Gravity, Urine 1.020 1.005 - 1.030   pH 5.5 5.0 - 8.0   Glucose, UA NEGATIVE NEGATIVE mg/dL   Hgb urine dipstick MODERATE (A) NEGATIVE   Bilirubin Urine NEGATIVE NEGATIVE   Ketones, ur 40 (A) NEGATIVE mg/dL   Protein, ur NEGATIVE NEGATIVE mg/dL   Nitrite NEGATIVE NEGATIVE   Leukocytes, UA NEGATIVE NEGATIVE  Urine microscopic-add on  Result Value Ref Range   Squamous Epithelial / LPF 0-5 (A) NONE SEEN   WBC, UA 0-5 0 - 5 WBC/hpf   RBC / HPF 6-30 0 - 5 RBC/hpf   Bacteria, UA RARE (A) NONE SEEN  Pregnancy, urine  Result Value Ref Range   Preg Test, Ur NEGATIVE NEGATIVE   Ct Abdomen Pelvis W Contrast  Result Date: 07/27/2016 CLINICAL DATA:  Upper abdominal pain  radiating to the right side. EXAM: CT ABDOMEN AND PELVIS WITH CONTRAST TECHNIQUE: Multidetector CT imaging of the abdomen and pelvis was performed using the standard protocol following bolus administration of intravenous contrast. CONTRAST:  ISOVUE-300 IOPAMIDOL (ISOVUE-300) INJECTION 61% COMPARISON:  Abdominal ultrasound 07/27/2016 FINDINGS: Lower chest: No pulmonary nodules. No visible pleural or pericardial effusion. Hepatobiliary: Normal hepatic size and contours without focal liver lesion. No perihepatic ascites. No intra- or extrahepatic biliary dilatation. Normal gallbladder. Pancreas: Normal pancreatic contours and enhancement. No peripancreatic fluid collection or pancreatic ductal dilatation. Spleen: Normal. Adrenals/Urinary Tract: Normal adrenal glands. Nonobstructing 2 mm right renal calculus. There is a calculus at the left renal hilum measuring 8 mm with associated pelviectasis. No ureterolithiasis. Urinary bladder is normal. Stomach/Bowel: No abnormal bowel dilatation. No bowel wall thickening or adjacent fat stranding to indicate acute inflammation. No abdominal fluid collection. Normal appendix. Vascular/Lymphatic: Normal course and caliber of the major abdominal vessels. There are multiple prominent right lower quadrant lymph nodes, measuring up to 1.2 cm (series 5, image 65). No pelvic adenopathy. Reproductive: Uterus is normal. The ovaries are normal. Trace free fluid in the pelvis, likely physiologic. Musculoskeletal: No lytic or blastic osseous lesion. Normal visualized extrathoracic and extraperitoneal soft tissues. Other: No contributory non-categorized findings. IMPRESSION: 1. Large, 8 mm renal calculus at the left renal hilum with mild dilatation of the left renal collecting system. 2. Multiple clustered right lower quadrant mesenteric lymph nodes in a pattern suggesting mesenteric adenitis. 3. Nonobstructing 2 mm right renal calculus. 1. Electronically Signed   By: Deatra Robinson M.D.    On: 07/27/2016 06:58   US Abdomen Limited  Result Date: 07/27/2016 CLINICAL DATA:  Right upper quadrant pain since yesterday evening. EXAM: US ABDOMEN LIMITED - RIGHT UPPER QUADRANT COMPARISON:  None. FINDINGS: Gallbladder: No gallstones or mural thickening. Gallbladder wall measures 2 mm in thickness. No sonographic Murphy sign noted by sonographer. Common bile duct: Diameter: 3 mm Liver: No focal lesion identified. Within normal limits in parenchymal echogenicity. IMPRESSION: Normal liver, gallbladder and bile ducts. Electronically Signed   By: Ellery Plunk M.D.   On: 07/27/2016 04:35   Medications  ondansetron (ZOFRAN-ODT) disintegrating tablet 4 mg (4 mg Oral Given 07/26/16 2312)  morphine 4 MG/ML injection 4 mg (4 mg Intravenous Given 07/27/16 0406)  ondansetron (ZOFRAN) injection 4 mg (4 mg Intravenous Given 07/27/16 0406)  sodium chloride 0.9 % bolus 1,000 mL (0  mLs Intravenous Stopped 07/27/16 0703)  HYDROmorphone (DILAUDID) injection 1 mg (1 mg Intravenous Given 07/27/16 0536)  ketorolac (TORADOL) 30 MG/ML injection 30 mg (30 mg Intravenous Given 07/27/16 0538)  iopamidol (ISOVUE-300) 61 % injection (100 mLs  Contrast Given 07/27/16 0634)  HYDROmorphone (DILAUDID) injection 1 mg (1 mg Intravenous Given 07/27/16 0708)  ondansetron (ZOFRAN) injection 4 mg (4 mg Intravenous Given 07/27/16 0708)  sodium chloride 0.9 % bolus 1,000 mL (0 mLs Intravenous Stopped 07/27/16 0801)  metoCLOPramide (REGLAN) injection 10 mg (10 mg Intravenous Given 07/27/16 0755)    MDM   Took patient care handoff report from [facility], PA-C. Plan: Continue to treat the patient's pain and nausea. Review abdominal CT. Dispo accordingly.  Findings and plan of care discussed with Gilda Crease, MD.   Evidence of mesenteric adenitis on CT. Given the patient's age and history of present illness, this is likely viral. Patient is nontoxic appearing, afebrile, not tachycardic, not tachypneic, not hypotensive,  maintains SPO2 of 100% on room air, and is in no apparent distress. Patient has no signs of sepsis or other serious or life-threatening condition. Renal stone on left will need urology follow-up. Continue to rehydrate the patient with IV fluids. Patient's pain and nausea control. Was able to pass an oral fluid challenge. The patient was given instructions for home care as well as return precautions. Patient voices understanding of these instructions, accepts the plan, and is comfortable with discharge.   Vitals:   07/26/16 2304 07/27/16 0126 07/27/16 0345 07/27/16 0536  BP: 151/87 119/59 121/65 123/69  Pulse: 96 81 86 81  Resp: 20 22 20    Temp: 100 F (37.8 C) 99.4 F (37.4 C)    TempSrc: Oral     SpO2: 97% 100% 98% 97%  Weight: 88.2 kg     Height: 5\' 8"  (1.727 m)      Vitals:   07/27/16 0701 07/27/16 0702 07/27/16 0730 07/27/16 0800  BP: 110/55  112/66 113/67  Pulse:  73 63 77  Resp:  20 18   Temp:      TempSrc:      SpO2:  100% 97% 96%  Weight:      Height:          Anselm Pancoast, PA-C 07/27/16 0804    Gilda Crease, MD 07/28/16 [phone]

## 2016-07-27 NOTE — ED Notes (Signed)
Patient transported to Ultrasound 

## 2016-07-27 NOTE — ED Provider Notes (Signed)
[facility]-EMERGENCY DEPT Provider Note   CSN: [id number] Arrival date & time: 07/26/16  2301    History   Chief Complaint Chief Complaint  Patient presents with  . Abdominal Pain    HPI Krystal Humphrey is a 30 y.o. female.  30 year old female with a history of anxiety, esophageal reflux, and depression presents to the emergency department for acute onset of right upper quadrant abdominal pain which began at 1900 tonight. Patient reports a constant pain which is waxing and waning in severity. It is sharp and present in her right upper abdomen and epigastric region. She notes worsening pain with deep breathing. She has had some nausea as well as looser bowel movements. She states that she took ibuprofen for pain prior to arrival without relief. She has had no fever, vomiting, vaginal complaints, or urinary symptoms. No history of abdominal surgeries. No reported sick contacts.   The history is provided by the patient. No language interpreter was used.  Abdominal Pain   Associated symptoms include diarrhea and nausea. Pertinent negatives include fever and vomiting.    Past Medical History:  Diagnosis Date  . Anxiety   . Chicken pox   . Depression   . GERD (gastroesophageal reflux disease)   . No pertinent past medical history   . Panic attack     Patient Active Problem List   Diagnosis Date Noted  . Loose stools 02/06/2016  . Abdominal discomfort 01/19/2016  . Obesity 01/07/2015  . SVD (spontaneous vaginal delivery) 09/24/2011  . DEPRESSIVE TYPE PSYCHOSIS 08/21/2009  . ANEMIA, IRON DEFICIENCY 05/08/2009  . INSECT BITE 05/07/2009  . ULCER-DUODENAL 03/12/2009  . NAUSEA 03/12/2009  . ABSENCE OF MENSTRUATION 10/02/2008  . EPIGASTRIC PAIN 10/01/2008  . REFLUX ESOPHAGITIS 10/10/2007    Past Surgical History:  Procedure Laterality Date  . KNEE ARTHROSCOPY     R knee  . KNEE SURGERY    . TONSILLECTOMY      OB History    Gravida Para Term Preterm AB Living   2 1 1   1 1    SAB TAB Ectopic Multiple Live Births   1       1       Home Medications    Prior to Admission medications   Medication Sig Start Date End Date Taking? Authorizing Provider  ibuprofen (ADVIL,MOTRIN) 800 MG tablet Take 1 tablet (800 mg total) by mouth 3 (three) times daily. 05/30/16   Rolm Gala Barrett, PA-C    Family History Family History  Problem Relation Age of Onset  . Diabetes Mother   . Thyroid disease Mother   . Heart attack Father   . Diabetes Maternal Aunt   . Diabetes Maternal Uncle   . COPD Maternal Grandmother   . Crohn's disease Maternal Grandmother   . Anesthesia problems Neg Hx   . Hypotension Neg Hx   . Malignant hyperthermia Neg Hx   . Pseudochol deficiency Neg Hx     Social History Social History  Substance Use Topics  . Smoking status: Former Smoker    Packs/day: 0.25    Years: 2.00    Quit date: 09/20/2008  . Smokeless tobacco: Never Used  . Alcohol use Yes     Comment: occasional     Allergies   Remeron [mirtazapine]; Metronidazole; and Tramadol   Review of Systems Review of Systems  Constitutional: Negative for fever.  Gastrointestinal: Positive for abdominal pain, diarrhea and nausea. Negative for vomiting.  Ten systems reviewed and are negative for acute change, except  as noted in the HPI.     Physical Exam Updated Vital Signs BP 119/59   Pulse 81   Temp 99.4 F (37.4 C)   Resp 22   Ht 5\' 8"  (1.727 m)   Wt 88.2 kg   SpO2 100%   BMI 29.57 kg/m   Physical Exam  Constitutional: She is oriented to person, place, and time. She appears well-developed and well-nourished. No distress.  Patient tearful, appears uncomfortable.  HENT:  Head: Normocephalic and atraumatic.  Eyes: Conjunctivae and EOM are normal. No scleral icterus.  Neck: Normal range of motion.  Cardiovascular: Normal rate, regular rhythm and intact distal pulses.   Pulmonary/Chest: Effort normal. No respiratory distress.  Respirations even and unlabored  Abdominal:  Soft. She exhibits no distension. There is tenderness. There is no rebound and no guarding.  Soft, mildly obese abdomen. There is focal tenderness in the right upper quadrant. Positive Murphy sign appreciated. No masses or rigidity.  Musculoskeletal: Normal range of motion.  Neurological: She is alert and oriented to person, place, and time.  GCS 15. Patient moving all extremities.  Skin: Skin is warm and dry. No rash noted. She is not diaphoretic. No erythema. No pallor.  Psychiatric: She has a normal mood and affect. Her behavior is normal.  Nursing note and vitals reviewed.    ED Treatments / Results  Labs (all labs ordered are listed, but only abnormal results are displayed) Labs Reviewed  COMPREHENSIVE METABOLIC PANEL - Abnormal; Notable for the following:       Result Value   CO2 20 (*)    Total Bilirubin 1.4 (*)    All other components within normal limits  CBC - Abnormal; Notable for the following:    WBC 12.3 (*)    Hemoglobin 15.7 (*)    All other components within normal limits  URINALYSIS, ROUTINE W REFLEX MICROSCOPIC (NOT AT [facility]) - Abnormal; Notable for the following:    Hgb urine dipstick MODERATE (*)    Ketones, ur 40 (*)    All other components within normal limits  URINE MICROSCOPIC-ADD ON - Abnormal; Notable for the following:    Squamous Epithelial / LPF 0-5 (*)    Bacteria, UA RARE (*)    All other components within normal limits  LIPASE, BLOOD  PREGNANCY, URINE    EKG  EKG Interpretation None       Radiology US Abdomen Limited  Result Date: 07/27/2016 CLINICAL DATA:  Right upper quadrant pain since yesterday evening. EXAM: US ABDOMEN LIMITED - RIGHT UPPER QUADRANT COMPARISON:  None. FINDINGS: Gallbladder: No gallstones or mural thickening. Gallbladder wall measures 2 mm in thickness. No sonographic Murphy sign noted by sonographer. Common bile duct: Diameter: 3 mm Liver: No focal lesion identified. Within normal limits in parenchymal echogenicity.  IMPRESSION: Normal liver, gallbladder and bile ducts. Electronically Signed   By: Ellery Plunk M.D.   On: 07/27/2016 04:35    Procedures Procedures (including critical care time)  Medications Ordered in ED Medications  ondansetron (ZOFRAN-ODT) disintegrating tablet 4 mg (4 mg Oral Given 07/26/16 2312)  morphine 4 MG/ML injection 4 mg (4 mg Intravenous Given 07/27/16 0406)  ondansetron (ZOFRAN) injection 4 mg (4 mg Intravenous Given 07/27/16 0406)  sodium chloride 0.9 % bolus 1,000 mL (1,000 mLs Intravenous New Bag/Given 07/27/16 0535)  HYDROmorphone (DILAUDID) injection 1 mg (1 mg Intravenous Given 07/27/16 0536)  ketorolac (TORADOL) 30 MG/ML injection 30 mg (30 mg Intravenous Given 07/27/16 0538)     Initial Impression /  Assessment and Plan / ED Course  I have reviewed the triage vital signs and the nursing notes.  Pertinent labs & imaging results that were available during my care of the patient were reviewed by me and considered in my medical decision making (see chart for details).  Clinical Course    Patient presenting for sudden onset abdominal pain. Pain primarily in the epigastric region and RUQ; however RUQ ultrasound negative for cholelithiasis or cholecystitis. No evidence for choledocholithiasis. Patient does have mild leukocytosis. She is afebrile.   Symptoms less c/w appendicitis. Low suspicion for pSBO, SBO, ruptured viscous, or mesenteric ischemia. Question gastroenteritis vs colitis vs kidney stone. CT abdomen pelvis ordered to further evaluated etiology of abdominal pain. Patient signed out to Harolyn RutherfordShawn Joy, PA-C who will follow up on imaging and disposition appropriately.   Final Clinical Impressions(s) / ED Diagnoses   Final diagnoses:  Right upper quadrant pain    New Prescriptions New Prescriptions   No medications on file     Antony MaduraKelly Saamiya Jeppsen, PA-C 07/27/16 0602    Gilda Creasehristopher J Pollina, MD 07/27/16 [phone]

## 2016-07-29 ENCOUNTER — Encounter (HOSPITAL_COMMUNITY): Payer: Self-pay | Admitting: Emergency Medicine

## 2016-07-29 ENCOUNTER — Emergency Department (HOSPITAL_COMMUNITY): Payer: Medicaid Other

## 2016-07-29 ENCOUNTER — Inpatient Hospital Stay (HOSPITAL_COMMUNITY)
Admission: EM | Admit: 2016-07-29 | Discharge: 2016-07-31 | DRG: 694 | Disposition: A | Discharge status: Home / Self Care | Payer: Medicaid Other | Attending: Internal Medicine | Admitting: Internal Medicine

## 2016-07-29 DIAGNOSIS — Z8249 Family history of ischemic heart disease and other diseases of the circulatory system: Secondary | ICD-10-CM

## 2016-07-29 DIAGNOSIS — Z6829 Body mass index (BMI) 29.0-29.9, adult: Secondary | ICD-10-CM

## 2016-07-29 DIAGNOSIS — F329 Major depressive disorder, single episode, unspecified: Secondary | ICD-10-CM | POA: Diagnosis present

## 2016-07-29 DIAGNOSIS — R109 Unspecified abdominal pain: Secondary | ICD-10-CM

## 2016-07-29 DIAGNOSIS — N200 Calculus of kidney: Principal | ICD-10-CM | POA: Diagnosis present

## 2016-07-29 DIAGNOSIS — K219 Gastro-esophageal reflux disease without esophagitis: Secondary | ICD-10-CM | POA: Diagnosis present

## 2016-07-29 DIAGNOSIS — Z87891 Personal history of nicotine dependence: Secondary | ICD-10-CM

## 2016-07-29 DIAGNOSIS — R599 Enlarged lymph nodes, unspecified: Secondary | ICD-10-CM | POA: Diagnosis present

## 2016-07-29 DIAGNOSIS — Z825 Family history of asthma and other chronic lower respiratory diseases: Secondary | ICD-10-CM

## 2016-07-29 DIAGNOSIS — E669 Obesity, unspecified: Secondary | ICD-10-CM | POA: Diagnosis present

## 2016-07-29 DIAGNOSIS — Z833 Family history of diabetes mellitus: Secondary | ICD-10-CM

## 2016-07-29 DIAGNOSIS — F419 Anxiety disorder, unspecified: Secondary | ICD-10-CM | POA: Diagnosis present

## 2016-07-29 LAB — BASIC METABOLIC PANEL
ANION GAP: 5 (ref 5–15)
BUN: 9 mg/dL (ref 6–20)
CALCIUM: 9.2 mg/dL (ref 8.9–10.3)
CO2: 27 mmol/L (ref 22–32)
CREATININE: 0.8 mg/dL (ref 0.44–1.00)
Chloride: 106 mmol/L (ref 101–111)
GFR calc Af Amer: >60 mL/min (ref >60)
GFR calc non Af Amer: >60 mL/min (ref >60)
GLUCOSE: 87 mg/dL (ref 65–99)
Potassium: 3.9 mmol/L (ref 3.5–5.1)
Sodium: 138 mmol/L (ref 135–145)

## 2016-07-29 LAB — URINALYSIS, ROUTINE W REFLEX MICROSCOPIC
Bilirubin Urine: NEGATIVE
Bilirubin Urine: NEGATIVE
GLUCOSE, UA: NEGATIVE mg/dL
Glucose, UA: NEGATIVE mg/dL
KETONES UR: NEGATIVE mg/dL
Ketones, ur: NEGATIVE mg/dL
LEUKOCYTES UA: NEGATIVE
Leukocytes, UA: NEGATIVE
NITRITE: NEGATIVE
Nitrite: NEGATIVE
PH: 7 (ref 5.0–8.0)
PH: 6 (ref 5.0–8.0)
Protein, ur: NEGATIVE mg/dL
Protein, ur: NEGATIVE mg/dL
SPECIFIC GRAVITY, URINE: 1.011 (ref 1.005–1.030)
Specific Gravity, Urine: 1.014 (ref 1.005–1.030)

## 2016-07-29 LAB — CBC
HEMATOCRIT: 41.6 % (ref 36.0–46.0)
Hemoglobin: 13.8 g/dL (ref 12.0–15.0)
MCH: 30.6 pg (ref 26.0–34.0)
MCHC: 33.2 g/dL (ref 30.0–36.0)
MCV: 92.2 fL (ref 78.0–100.0)
Platelets: 246 10*3/uL (ref 150–400)
RBC: 4.51 MIL/uL (ref 3.87–5.11)
RDW: 12.9 % (ref 11.5–15.5)
WBC: 7.2 10*3/uL (ref 4.0–10.5)

## 2016-07-29 LAB — URINE MICROSCOPIC-ADD ON

## 2016-07-29 MED ORDER — ONDANSETRON HCL 4 MG/2ML IJ SOLN
4.0000 mg | Freq: Once | INTRAMUSCULAR | Status: AC
  Administered 2016-07-29: 4 mg via INTRAVENOUS
  Filled 2016-07-29: qty 2

## 2016-07-29 MED ORDER — HYDROMORPHONE HCL 1 MG/ML IJ SOLN
1.0000 mg | Freq: Once | INTRAMUSCULAR | Status: AC
Start: 2016-07-30 — End: 2016-07-30
  Administered 2016-07-30: 1 mg via INTRAVENOUS
  Filled 2016-07-29: qty 1

## 2016-07-29 MED ORDER — KETOROLAC TROMETHAMINE 15 MG/ML IJ SOLN
15.0000 mg | Freq: Once | INTRAMUSCULAR | Status: AC
  Administered 2016-07-29: 15 mg via INTRAVENOUS
  Filled 2016-07-29: qty 1

## 2016-07-29 MED ORDER — ACETAMINOPHEN 500 MG PO TABS
1000.0000 mg | ORAL_TABLET | Freq: Once | ORAL | Status: AC
  Administered 2016-07-29: 1000 mg via ORAL
  Filled 2016-07-29: qty 2

## 2016-07-29 MED ORDER — HYDROMORPHONE HCL 1 MG/ML IJ SOLN
1.0000 mg | Freq: Once | INTRAMUSCULAR | Status: AC
  Administered 2016-07-29: 1 mg via INTRAVENOUS
  Filled 2016-07-29: qty 1

## 2016-07-29 NOTE — Consult Note (Signed)
New Urology Consult Note   Requesting Attending Physician:  Lyndal Pulley, MD Service Requesting Consult:  ER Service Providing Consult: Urology Consulting Attending: Vernie Ammons, MD  Assessment:  Patient is a 30 y.o. female with second presentation to ER in 3 days for diffuse abdominal pain, back pain (L>R), with diarrhea, n/v, low UOP. CT imaging at initial presentation showed bilateral intrarenal stones, L > R, confirmed again today on RUS with no hydronephrosis bilaterally. AFVSS here. No AKI or leukocytosis. U/a not consistent with infection. Query whether symptoms may be related to GI or intraabdominal process, not typical renal calculi/stone symptoms for this patient. We would not recommend urgent ureteral stenting on the left (or the right) for this patient. She does not feel very comfortable discharging from the ER again.   Recommendations: 1. Recommend medicine admission for supportive care 2. Recommend IV hydration, antiemetics, PRN pain medications (narcotic and NSAIDs including toradol would be best options for urolithiasis, however am not convinced her symptoms are related to stones) 3. No flomax or urinary straining required 4. We will plan to re-assess her symptoms again tomorrow. If no improvement we could consider left sided ureteral stent however would favor deferring any treatment of stones to outpatient procedure, possibly ESWL, to ensure patient not actively dealing with GI or other infection  5. Follow up urine culture, no requirement for antibiotics from urology standpoint  Thank you for this consult. Please contact the urology consult pager with any further questions/concerns. Jalene Mullet, MD Urology Surgical Resident   HPI  Reason for Consult:  Flank pain, nephrolithiasis  Krystal Humphrey is seen in consultation for reasons noted above at the request of Lyndal Pulley, MD on the ER service.  This is a 30 yo patient with a history of anxiety, depression, GERD. No  previous history of nephrolithiasis. Previously presented to ER 3 days ago with RLQ pain and RUQ pain. CT imaging showed mesenteric adenitis as well as bilateral renal stones (right 2mm, no hydro; left 8mm renal pelvis, mild pelviectasis). Was sent home with percocet & reglan, neither have helped much. Continued to have pain, although nonspecific and diffuse, now affecting left back and radiating to right back. Left flank pain also present. + nausea, emesis multiple times today. Diarrhea recently but none today.   No urinary urgency, frequency, hematuria. If anything decreased UOP. Tmax over last 3 days that she knows of is temp 100.6 on Monday. Last meal some toast this morning. AFVSS. Cr 0.8. WBC 7. Urinalysis does not appear consistent with infectious process on cathed u/a, culture pending. Repeat renal u/s shows bilateral intrarenal stones, no hydro.   She appears relatively comfortable, non-toxic and not septic. Tells me she is having 9/10 pain. No known sick contacts.   Past Medical History: Past Medical History:  Diagnosis Date  . Anxiety   . Chicken pox   . Depression   . GERD (gastroesophageal reflux disease)   . No pertinent past medical history   . Panic attack     Past Surgical History:  Past Surgical History:  Procedure Laterality Date  . KNEE ARTHROSCOPY     R knee  . KNEE SURGERY    . TONSILLECTOMY      Medication: Current Facility-Administered Medications  Medication Dose Route Frequency Provider Last Rate Last Dose  . [START ON 07/30/2016] HYDROmorphone (DILAUDID) injection 1 mg  1 mg Intravenous Once Antony Madura, PA-C       Current Outpatient Prescriptions  Medication Sig Dispense Refill  . ibuprofen (ADVIL,MOTRIN)  200 MG tablet Take 800 mg by mouth every 6 (six) hours as needed for mild pain.    Marland Kitchen. metoCLOPramide (REGLAN) 10 MG tablet Take 1 tablet (10 mg total) by mouth every 6 (six) hours. 30 tablet 0  . oxyCODONE-acetaminophen (PERCOCET/ROXICET) 5-325 MG tablet  Take 1 tablet by mouth every 6 (six) hours as needed for severe pain. 15 tablet 0  . ibuprofen (ADVIL,MOTRIN) 800 MG tablet Take 1 tablet (800 mg total) by mouth 3 (three) times daily. (Patient not taking: Reported on 07/27/2016) 21 tablet 0  . ondansetron (ZOFRAN ODT) 4 MG disintegrating tablet Take 1 tablet (4 mg total) by mouth every 8 (eight) hours as needed for nausea or vomiting. (Patient not taking: Reported on 07/29/2016) 20 tablet 0    Allergies: Allergies  Allergen Reactions  . Remeron [Mirtazapine] Anaphylaxis  . Metronidazole Nausea And Vomiting  . Tramadol Itching    Social History: Social History  Substance Use Topics  . Smoking status: Former Smoker    Packs/day: 0.25    Years: 2.00    Quit date: 09/20/2008  . Smokeless tobacco: Never Used  . Alcohol use Yes     Comment: occasional    Family History Family History  Problem Relation Age of Onset  . Diabetes Mother   . Thyroid disease Mother   . Heart attack Father   . Diabetes Maternal Aunt   . Diabetes Maternal Uncle   . COPD Maternal Grandmother   . Crohn's disease Maternal Grandmother   . Anesthesia problems Neg Hx   . Hypotension Neg Hx   . Malignant hyperthermia Neg Hx   . Pseudochol deficiency Neg Hx    No known family history of nephrolithiasis.   Review of Systems 10 systems were reviewed and are negative except as noted specifically in the HPI.  Objective   Vital signs in last 24 hours: BP 103/66   Pulse 60   Temp 98.1 F (36.7 C) (Oral)   Resp 16   SpO2 99%   Intake/Output last 3 shifts: No intake/output data recorded.  Physical Exam General: NAD, A&O, resting, appropriate HEENT: [place]/AT, EOMI, MMM Pulmonary: Normal work of breathing on RA Cardiovascular: Regular rate & rhythm, HDS, adequate peripheral perfusion Abdomen: soft, TTP in left flank, nondistended, mild suprapubic tenderness GU: spontaneously voiding, reports bilateral CVA tenderness left > right, states lower back is also  TTP over paraspinous muscles inferior to expected level of kidneys Extremities: warm and well perfused, no edema Neuro: Appropriate, no focal neurological deficits  Most Recent Labs: Lab Results  Component Value Date   WBC 7.2 07/29/2016   HGB 13.8 07/29/2016   HCT 41.6 07/29/2016   PLT 246 07/29/2016    Lab Results  Component Value Date   NA 138 07/29/2016   K 3.9 07/29/2016   CL 106 07/29/2016   CO2 27 07/29/2016   BUN 9 07/29/2016   CREATININE 0.80 07/29/2016   CALCIUM 9.2 07/29/2016   MG 1.9 07/22/2009   PHOS 3.3 11/04/2009    Lab Results  Component Value Date   ALKPHOS 68 07/26/2016   BILITOT 1.4 (H) 07/26/2016   BILIDIR <0.1 04/03/2009   PROT 7.4 07/26/2016   ALBUMIN 4.5 07/26/2016   ALT 35 07/26/2016   AST 34 07/26/2016    Lab Results  Component Value Date   INR 1.2 07/23/2009   APTT 33 04/04/2009     Urine Culture: @[id number] (laburin,org,[id number])@    IMAGING: [facility]  Result Date: 07/29/2016 CLINICAL DATA:  Left  flank pain for 3 days. EXAM: RENAL / URINARY TRACT ULTRASOUND COMPLETE COMPARISON:  CT abdomen and pelvis 07/27/2016 FINDINGS: Right Kidney: Length: 10.8 cm. Echogenic stone demonstrated in the mid/lower pole pole the right kidney measuring 3 mm diameter. No hydronephrosis. Normal parenchymal echotexture and thickness. No focal mass lesions. Left Kidney: Length: 11.5 cm. Echogenic stone demonstrated in the mid portion of the left kidney over the renal pelvis measuring 8 mm diameter. No hydronephrosis. Normal parenchymal echotexture and thickness. No focal mass lesions. Bladder: Bladder is decompressed due to recent catheterization and is not able to be evaluated. IMPRESSION: Bilateral nonobstructing intrarenal stones.  No hydronephrosis Electronically Signed   By: Burman Nieves M.D.   On: 07/29/2016 23:16    Patient was seen, examined,treatment plan was discussed with the resident.  I have directly reviewed the clinical findings, lab,  imaging studies and management of this patient in detail. I have made the necessary changes and/or additions to the above noted documentation, and agree with the documentation, as recorded by the resident.

## 2016-07-29 NOTE — ED Triage Notes (Signed)
Patient here with ongoing left flank pain with nausea. Diagnosed with kidney stone on Monday and referred to urology, no appt until next week. Reports decreased urination with same.

## 2016-07-29 NOTE — ED Provider Notes (Signed)
9:24 PM Case discussed with Dr. Regis BillMacey of urology who has evaluated the patient. He recommends observation admission to medicine given that right stone is nonobstructing and the left stone is not truly an obstructive stone. Urology expresses that is is less likely for this left renal stone to be causing the patient's abdominal pain. Dr. Regis BillMacey also recommends repeat UA and culture with in and out catheter to collect specimen. Urology plans to reevaluate in the morning. If patient improving, they will continue to follow her outpatient. If no improvement in pain tomorrow, [facility]. Will discuss case with [facility].  9:59 PM Case discussed with Dr. Ophelia CharterYates of [facility] who requests ultrasound prior to admission to evaluate for any worsening of dilatation of the left kidney or obstruction. If ultrasound stable compared to prior CT, Dr. Ophelia CharterYates to evaluate for admission.  11:41 PM Formal US of kidneys with b/l nonobstructing stones. No hydronephrosis. Dr. Ophelia CharterYates to see for observation admission.  [place]  Result Date: 07/29/2016 CLINICAL DATA:  Left flank pain for 3 days. EXAM: RENAL / URINARY TRACT ULTRASOUND COMPLETE COMPARISON:  CT abdomen and pelvis 07/27/2016 FINDINGS: Right Kidney: Length: 10.8 cm. Echogenic stone demonstrated in the mid/lower pole pole the right kidney measuring 3 mm diameter. No hydronephrosis. Normal parenchymal echotexture and thickness. No focal mass lesions. Left Kidney: Length: 11.5 cm. Echogenic stone demonstrated in the mid portion of the left kidney over the renal pelvis measuring 8 mm diameter. No hydronephrosis. Normal parenchymal echotexture and thickness. No focal mass lesions. Bladder: Bladder is decompressed due to recent catheterization and is not able to be evaluated. IMPRESSION: Bilateral nonobstructing intrarenal stones.  No hydronephrosis Electronically Signed   By: Burman NievesWilliam  Stevens M.D.   On: 07/29/2016 23:16       Antony MaduraKelly Costella Schwarz, PA-C 07/29/16  2342    Lyndal Pulleyaniel Knott, MD 07/30/16 [phone]

## 2016-07-29 NOTE — H&P (Signed)
History and Physical    Krystal AugustBrandi Bellanger ZOX:[id number] DOB: Sep 10, 1986 DOA: 07/29/2016  PCP: Jeanine Luzalone, Gregory, FNP Consultants:  None Patient coming from: home - lives with 28yo daughter and her father  Chief Complaint: flank pain  HPI: Krystal Humphrey is a 30 y.o. female with medical history significant of anxiety/depression presenting with left flank pain.  Patient was seen in ER Monday AM and diagnosed with large (8mm) left kidney stone.  Minimal UOP, pain medication not helping, nauseated with some vomiting.  Pain is in left flank but radiates across back when she moves.  It wakes her up from sleep at night, unable to roll over due to pain.  100.6 Monday, no fever since.  Feels like she needs to void but minimal UOP when trying.  Able to drink some but stops when she gets nauseated.  She did have diarrhea, but this was last noted on Tuesday.   ED Course: Per PA-C Hedges: Patient presents with 8 mm stone in pain. She was recently seen and evaluated discharged home on oral pain medication. She continues to endorse pain, nausea or vomiting. Urology was consult and who instructed to give patient Tylenol in addition to a party given pain medication, consult resident on-call for evaluation in the ED.  She continues to endorse pain after Toradol, Dilaudid, acetaminophen. Resident on-call consult to evaluate the patient here in the ED.  Per PA-C Humes: 9:24 PM Case discussed with Dr. Regis BillMacey of urology who has evaluated the patient. He recommends observation admission to medicine given that right stone is nonobstructing and the left stone is not truly an obstructive stone. Urology expresses that is is less likely for this left renal stone to be causing the patient's abdominal pain. Dr. Regis BillMacey also recommends repeat UA and culture with in and out catheter to collect specimen. Urology plans to reevaluate in the morning. If patient improving, they will continue to follow her outpatient. If no improvement in pain  tomorrow, [facility]. Will discuss case with [facility].  9:59 PM Case discussed with Dr. [facility] who requests ultrasound prior to admission to evaluate for any worsening of dilatation of the left kidney or obstruction. If ultrasound stable compared to prior CT, Dr. Ophelia CharterYates to evaluate for admission.  11:41 PM Formal US of kidneys with b/l nonobstructing stones. No hydronephrosis. Dr. Ophelia CharterYates to see for observation admission.  Review of Systems: As per HPI; otherwise 10 point review of systems reviewed and negative.   Ambulatory Status:  Ambulates without assistance  Past Medical History:  Diagnosis Date  . Anxiety   . Depression   . GERD (gastroesophageal reflux disease)   . Panic attack     Past Surgical History:  Procedure Laterality Date  . KNEE ARTHROSCOPY     R knee  . KNEE SURGERY    . TONSILLECTOMY      Social History   Social History  . Marital status: Divorced    Spouse name: N/A  . Number of children: 1  . Years of education: 5412   Occupational History  . unemployed    Social History Main Topics  . Smoking status: Former Smoker    Packs/day: 0.25    Years: 2.00    Quit date: 09/20/2008  . Smokeless tobacco: Never Used  . Alcohol use Yes     Comment: occasional - several a week when she can afford it  . Drug use: No  . Sexual activity: Not Currently  Birth control/ protection: Implant   Other Topics Concern  . Not on file   Social History Narrative   Born and raised in [place], [place]. Currently resides in a house with her boyfriend and daughter. 2 dogs. Fun: Softball, bowl   Denies religious beliefs that would effect health care.     Allergies  Allergen Reactions  . Remeron [Mirtazapine] Anaphylaxis  . Metronidazole Nausea And Vomiting  . Tramadol Itching    Family History  Problem Relation Age of Onset  . Diabetes Mother   . Thyroid disease Mother   . Heart attack Father   . Diabetes Maternal Aunt   .  Diabetes Maternal Uncle   . COPD Maternal Grandmother   . Crohn's disease Maternal Grandmother   . Anesthesia problems Neg Hx   . Hypotension Neg Hx   . Malignant hyperthermia Neg Hx   . Pseudochol deficiency Neg Hx     Prior to Admission medications   Medication Sig Start Date End Date Taking? Authorizing Provider  ibuprofen (ADVIL,MOTRIN) 200 MG tablet Take 800 mg by mouth every 6 (six) hours as needed for mild pain.   Yes Historical Provider, MD  metoCLOPramide (REGLAN) 10 MG tablet Take 1 tablet (10 mg total) by mouth every 6 (six) hours. 07/27/16  Yes Shawn C Joy, PA-C  oxyCODONE-acetaminophen (PERCOCET/ROXICET) 5-325 MG tablet Take 1 tablet by mouth every 6 (six) hours as needed for severe pain. 07/27/16  Yes Shawn C Joy, PA-C  ibuprofen (ADVIL,MOTRIN) 800 MG tablet Take 1 tablet (800 mg total) by mouth 3 (three) times daily. Patient not taking: Reported on 07/27/2016 05/30/16   Rolm Gala Barrett, PA-C  ondansetron (ZOFRAN ODT) 4 MG disintegrating tablet Take 1 tablet (4 mg total) by mouth every 8 (eight) hours as needed for nausea or vomiting. Patient not taking: Reported on 07/29/2016 07/27/16   Anselm Pancoast, PA-C    Physical Exam: Vitals:   07/29/16 1645 07/30/16 0000 07/30/16 0003 07/30/16 0107  BP: 103/66 117/64 117/64 125/71  Pulse: 60 60 68 (!) 56  Resp: 16  16 18   Temp:    97.7 F (36.5 C)  TempSrc:    Oral  SpO2: 99% 99% 100% 100%     General:  Appears calm and comfortable and is NAD Eyes:  PERRL, EOMI, normal lids, iris ENT:  grossly normal hearing, lips & tongue, mmm Neck:  no LAD, masses or thyromegaly Cardiovascular:  RRR, no m/r/g. No LE edema.  Respiratory:  CTA bilaterally, no w/r/r. Normal respiratory effort. Abdomen:  soft, ntnd, NABS Back: exquisite tenderness of R CVA Skin:  no rash or induration seen on limited exam Musculoskeletal:  grossly normal tone BUE/BLE, good ROM, no bony abnormality Psychiatric:  grossly normal mood and affect, speech fluent and  appropriate, AOx3 Neurologic:  CN 2-12 grossly intact, moves all extremities in coordinated fashion, sensation intact  Labs on Admission: I have personally reviewed following labs and imaging studies  CBC:  Recent Labs Lab 07/26/16 2329 07/29/16 1250  WBC 12.3* 7.2  HGB 15.7* 13.8  HCT 45.3 41.6  MCV 91.3 92.2  PLT 256 246   Basic Metabolic Panel:  Recent Labs Lab 07/26/16 2329 07/29/16 1250  NA 136 138  K 4.0 3.9  CL 104 106  CO2 20* 27  GLUCOSE 91 87  BUN 14 9  CREATININE 0.77 0.80  CALCIUM 9.4 9.2   GFR: Estimated Creatinine Clearance: 122.7 mL/min (by C-G formula based on SCr of 0.8 mg/dL). Liver Function Tests:  Recent  Labs Lab 07/26/16 2329  AST 34  ALT 35  ALKPHOS 68  BILITOT 1.4*  PROT 7.4  ALBUMIN 4.5    Recent Labs Lab 07/26/16 2329  LIPASE 18   No results for input(s): AMMONIA in the last 168 hours. Coagulation Profile: No results for input(s): INR, PROTIME in the last 168 hours. Cardiac Enzymes: No results for input(s): CKTOTAL, CKMB, CKMBINDEX, TROPONINI in the last 168 hours. BNP (last 3 results) No results for input(s): PROBNP in the last 8760 hours. HbA1C: No results for input(s): HGBA1C in the last 72 hours. CBG: No results for input(s): GLUCAP in the last 168 hours. Lipid Profile: No results for input(s): CHOL, HDL, LDLCALC, TRIG, CHOLHDL, LDLDIRECT in the last 72 hours. Thyroid Function Tests: No results for input(s): TSH, T4TOTAL, FREET4, T3FREE, THYROIDAB in the last 72 hours. Anemia Panel: No results for input(s): VITAMINB12, FOLATE, FERRITIN, TIBC, IRON, RETICCTPCT in the last 72 hours. Urine analysis:    Component Value Date/Time   COLORURINE YELLOW 07/29/2016 2125   APPEARANCEUR CLEAR 07/29/2016 2125   LABSPEC 1.014 07/29/2016 2125   PHURINE 6.0 07/29/2016 2125   GLUCOSEU NEGATIVE 07/29/2016 2125   HGBUR LARGE (A) 07/29/2016 2125   BILIRUBINUR NEGATIVE 07/29/2016 2125   KETONESUR NEGATIVE 07/29/2016 2125    PROTEINUR NEGATIVE 07/29/2016 2125   UROBILINOGEN 0.2 10/04/2011 1734   NITRITE NEGATIVE 07/29/2016 2125   LEUKOCYTESUR NEGATIVE 07/29/2016 2125    Creatinine Clearance: Estimated Creatinine Clearance: 122.7 mL/min (by C-G formula based on SCr of 0.8 mg/dL).  Sepsis Labs: @LABRCNTIP (procalcitonin:4,lacticidven:4) )No results found for this or any previous visit (from the past 240 hour(s)).   Radiological Exams on Admission: US Renal  Result Date: 07/29/2016 CLINICAL DATA:  Left flank pain for 3 days. EXAM: RENAL / URINARY TRACT ULTRASOUND COMPLETE COMPARISON:  CT abdomen and pelvis 07/27/2016 FINDINGS: Right Kidney: Length: 10.8 cm. Echogenic stone demonstrated in the mid/lower pole pole the right kidney measuring 3 mm diameter. No hydronephrosis. Normal parenchymal echotexture and thickness. No focal mass lesions. Left Kidney: Length: 11.5 cm. Echogenic stone demonstrated in the mid portion of the left kidney over the renal pelvis measuring 8 mm diameter. No hydronephrosis. Normal parenchymal echotexture and thickness. No focal mass lesions. Bladder: Bladder is decompressed due to recent catheterization and is not able to be evaluated. IMPRESSION: Bilateral nonobstructing intrarenal stones.  No hydronephrosis Electronically Signed   By: Burman Nieves M.D.   On: 07/29/2016 23:16    EKG: not done  Assessment/Plan Principal Problem:   Abdominal discomfort Active Problems:   Nephrolithiasis   Patient with otherwise uncomplicated medical history presenting with subacute onset of abd pain/n/v/d (resolved 2 days ago) and found to have 3 mm right-sided kidney stone without hydronephrosis and 8 mm left stone without hydronephrosis.  On CT 2 days prior she was also found to have mesenteric adenitis, possibly from viral gastroenteritis.  By the description of her pain and her severe R CVAT in conjunction with a large stone on that side, symptomatic nephrolithiasis seems more likely as the  diagnosis.  She is also unlikely to pass that stone regardless of whether the current issue is related. However, urology has seen the patient and prefers to wait to see if her symptoms improve overnight prior to deciding whether to stent.  Will place in observation status, aggressively hydrate, and treat pain and nausea.  Will reevaluate in AM to see if she is improving or whether she will need to transfer to [facility] for further urologic management.  DVT prophylaxis: Early ambulation Code Status:  Full - confirmed with patient Family Communication: None present Disposition Plan:  Home once clinically improved Consults called: Urology  Admission status: Observation to med surg    Jonah Blue MD [facility]  If 7PM-7AM, please contact night-coverage [email or web address] Password [email or web address]  07/30/2016, 2:14 AM

## 2016-07-29 NOTE — ED Notes (Signed)
Reports 8mm stone in Left kidney stone.

## 2016-07-29 NOTE — ED Notes (Signed)
MD at bedside. 

## 2016-07-29 NOTE — ED Notes (Signed)
Patient transported to Ultrasound 

## 2016-07-29 NOTE — ED Provider Notes (Signed)
[facility]-EMERGENCY DEPT Provider Note   CSN: [id number] Arrival date & time: 07/29/16  1236     History   Chief Complaint Chief Complaint  Patient presents with  . Flank Pain    HPI Krystal Humphrey is a 30 y.o. female.  HPI   30 year old female presents today with flank pain. Patient was seen 2 days ago in the emergency room and diagnosed with 8 mm renal calculus. She reports she was discharged home with Percocet, antinausea medication. She notes continued pain, noting that the Percocet is not improving her symptoms. She reports continued nausea and vomiting after eating. She denies any fever or chills, abdominal pain. She reports decreased urinary output.  Past Medical History:  Diagnosis Date  . Anxiety   . Chicken pox   . Depression   . GERD (gastroesophageal reflux disease)   . No pertinent past medical history   . Panic attack     Patient Active Problem List   Diagnosis Date Noted  . Loose stools 02/06/2016  . Abdominal discomfort 01/19/2016  . Obesity 01/07/2015  . SVD (spontaneous vaginal delivery) 09/24/2011  . DEPRESSIVE TYPE PSYCHOSIS 08/21/2009  . ANEMIA, IRON DEFICIENCY 05/08/2009  . INSECT BITE 05/07/2009  . ULCER-DUODENAL 03/12/2009  . NAUSEA 03/12/2009  . ABSENCE OF MENSTRUATION 10/02/2008  . EPIGASTRIC PAIN 10/01/2008  . REFLUX ESOPHAGITIS 10/10/2007    Past Surgical History:  Procedure Laterality Date  . KNEE ARTHROSCOPY     R knee  . KNEE SURGERY    . TONSILLECTOMY      OB History    Gravida Para Term Preterm AB Living   2 1 1   1 1    SAB TAB Ectopic Multiple Live Births   1       1       Home Medications    Prior to Admission medications   Medication Sig Start Date End Date Taking? Authorizing Provider  ibuprofen (ADVIL,MOTRIN) 200 MG tablet Take 800 mg by mouth every 6 (six) hours as needed for mild pain.   Yes Historical Provider, MD  metoCLOPramide (REGLAN) 10 MG tablet Take 1 tablet (10 mg total) by mouth every 6 (six) hours.  07/27/16  Yes Shawn C Joy, PA-C  oxyCODONE-acetaminophen (PERCOCET/ROXICET) 5-325 MG tablet Take 1 tablet by mouth every 6 (six) hours as needed for severe pain. 07/27/16  Yes Shawn C Joy, PA-C  ibuprofen (ADVIL,MOTRIN) 800 MG tablet Take 1 tablet (800 mg total) by mouth 3 (three) times daily. Patient not taking: Reported on 07/27/2016 05/30/16   Rolm GalaStevi Barrett, PA-C  ondansetron (ZOFRAN ODT) 4 MG disintegrating tablet Take 1 tablet (4 mg total) by mouth every 8 (eight) hours as needed for nausea or vomiting. Patient not taking: Reported on 07/29/2016 07/27/16   Anselm PancoastShawn C Joy, PA-C    Family History Family History  Problem Relation Age of Onset  . Diabetes Mother   . Thyroid disease Mother   . Heart attack Father   . Diabetes Maternal Aunt   . Diabetes Maternal Uncle   . COPD Maternal Grandmother   . Crohn's disease Maternal Grandmother   . Anesthesia problems Neg Hx   . Hypotension Neg Hx   . Malignant hyperthermia Neg Hx   . Pseudochol deficiency Neg Hx     Social History Social History  Substance Use Topics  . Smoking status: Former Smoker    Packs/day: 0.25    Years: 2.00    Quit date: 09/20/2008  . Smokeless tobacco: Never Used  .  Alcohol use Yes     Comment: occasional     Allergies   Remeron [mirtazapine]; Metronidazole; and Tramadol   Review of Systems Review of Systems  All other systems reviewed and are negative.    Physical Exam Updated Vital Signs BP 103/66   Pulse 60   Temp 98.1 F (36.7 C) (Oral)   Resp 16   SpO2 99%   Physical Exam  Constitutional: She is oriented to person, place, and time. She appears well-developed and well-nourished.  HENT:  Head: Normocephalic and atraumatic.  Eyes: Conjunctivae are normal. Pupils are equal, round, and reactive to light. Right eye exhibits no discharge. Left eye exhibits no discharge. No scleral icterus.  Neck: Normal range of motion. No JVD present. No tracheal deviation present.  Pulmonary/Chest: Effort  normal. No stridor.  Abdominal: Soft. She exhibits no distension and no mass. There is no tenderness. There is no rebound and no guarding. No hernia.  No CVA tenderness  Neurological: She is alert and oriented to person, place, and time. Coordination normal.  Psychiatric: She has a normal mood and affect. Her behavior is normal. Judgment and thought content normal.  Nursing note and vitals reviewed.    ED Treatments / Results  Labs (all labs ordered are listed, but only abnormal results are displayed) Labs Reviewed  URINALYSIS, ROUTINE W REFLEX MICROSCOPIC (NOT AT [facility]) - Abnormal; Notable for the following:       Result Value   Hgb urine dipstick TRACE (*)    All other components within normal limits  URINE MICROSCOPIC-ADD ON - Abnormal; Notable for the following:    Squamous Epithelial / LPF 6-30 (*)    Bacteria, UA FEW (*)    All other components within normal limits  CBC  BASIC METABOLIC PANEL    EKG  EKG Interpretation None       Radiology No results found.  Procedures Procedures (including critical care time)  Medications Ordered in ED Medications  ketorolac (TORADOL) 15 MG/ML injection 15 mg (15 mg Intravenous Given 07/29/16 1713)  ondansetron (ZOFRAN) injection 4 mg (4 mg Intravenous Given 07/29/16 1713)  HYDROmorphone (DILAUDID) injection 1 mg (1 mg Intravenous Given 07/29/16 1802)  acetaminophen (TYLENOL) tablet 1,000 mg (1,000 mg Oral Given 07/29/16 1812)     Initial Impression / Assessment and Plan / ED Course  I have reviewed the triage vital signs and the nursing notes.  Pertinent labs & imaging results that were available during my care of the patient were reviewed by me and considered in my medical decision making (see chart for details).  Clinical Course      Final Clinical Impressions(s) / ED Diagnoses   Final diagnoses:  Kidney stone   Labs:  Imaging:  Consults:  Therapeutics:  Discharge Meds:   Assessment/Plan:  Patient  presents with 8 mm stone in pain. She was recently seen and evaluated discharged home on oral pain medication. She continues to endorse pain, nausea or vomiting. Urology was consult and who instructed to give patient Tylenol in addition to a party given pain medication, consult resident on-call for evaluation in the ED.  She continues to endorse pain after Toradol, Dilaudid, acetaminophen. Resident on-call consult to evaluate the patient here in the ED.     New Prescriptions New Prescriptions   No medications on file     Eyvonne Mechanic, PA-C 07/29/16 2027    Lyndal Pulley, MD 07/29/16 2104

## 2016-07-29 NOTE — ED Notes (Signed)
PA has been notified of pt c/o pain.

## 2016-07-30 ENCOUNTER — Encounter (HOSPITAL_COMMUNITY): Payer: Self-pay | Admitting: Internal Medicine

## 2016-07-30 ENCOUNTER — Inpatient Hospital Stay (HOSPITAL_COMMUNITY): Payer: Medicaid Other | Admitting: Anesthesiology

## 2016-07-30 ENCOUNTER — Encounter (HOSPITAL_COMMUNITY)
Admission: EM | Disposition: A | Discharge status: Home / Self Care | Payer: Self-pay | Source: Home / Self Care | Attending: Internal Medicine

## 2016-07-30 DIAGNOSIS — N200 Calculus of kidney: Secondary | ICD-10-CM

## 2016-07-30 DIAGNOSIS — R109 Unspecified abdominal pain: Secondary | ICD-10-CM | POA: Diagnosis present

## 2016-07-30 DIAGNOSIS — R599 Enlarged lymph nodes, unspecified: Secondary | ICD-10-CM | POA: Diagnosis present

## 2016-07-30 DIAGNOSIS — F329 Major depressive disorder, single episode, unspecified: Secondary | ICD-10-CM | POA: Diagnosis present

## 2016-07-30 DIAGNOSIS — Z6829 Body mass index (BMI) 29.0-29.9, adult: Secondary | ICD-10-CM | POA: Diagnosis not present

## 2016-07-30 DIAGNOSIS — Z825 Family history of asthma and other chronic lower respiratory diseases: Secondary | ICD-10-CM | POA: Diagnosis not present

## 2016-07-30 DIAGNOSIS — E669 Obesity, unspecified: Secondary | ICD-10-CM | POA: Diagnosis present

## 2016-07-30 DIAGNOSIS — Z833 Family history of diabetes mellitus: Secondary | ICD-10-CM | POA: Diagnosis not present

## 2016-07-30 DIAGNOSIS — F419 Anxiety disorder, unspecified: Secondary | ICD-10-CM | POA: Diagnosis present

## 2016-07-30 DIAGNOSIS — Z8249 Family history of ischemic heart disease and other diseases of the circulatory system: Secondary | ICD-10-CM | POA: Diagnosis not present

## 2016-07-30 DIAGNOSIS — Z87891 Personal history of nicotine dependence: Secondary | ICD-10-CM | POA: Diagnosis not present

## 2016-07-30 DIAGNOSIS — K219 Gastro-esophageal reflux disease without esophagitis: Secondary | ICD-10-CM | POA: Diagnosis present

## 2016-07-30 HISTORY — PX: CYSTOSCOPY WITH RETROGRADE PYELOGRAM, URETEROSCOPY AND STENT PLACEMENT: SHX5789

## 2016-07-30 LAB — BASIC METABOLIC PANEL
Anion gap: 8 (ref 5–15)
BUN: 11 mg/dL (ref 6–20)
CHLORIDE: 103 mmol/L (ref 101–111)
CO2: 28 mmol/L (ref 22–32)
CREATININE: 0.86 mg/dL (ref 0.44–1.00)
Calcium: 8.7 mg/dL — ABNORMAL LOW (ref 8.9–10.3)
GFR calc Af Amer: >60 mL/min (ref >60)
GFR calc non Af Amer: >60 mL/min (ref >60)
GLUCOSE: 90 mg/dL (ref 65–99)
Potassium: 3.2 mmol/L — ABNORMAL LOW (ref 3.5–5.1)
SODIUM: 139 mmol/L (ref 135–145)

## 2016-07-30 LAB — CBC
HCT: 38.5 % (ref 36.0–46.0)
Hemoglobin: 12.6 g/dL (ref 12.0–15.0)
MCH: 30.4 pg (ref 26.0–34.0)
MCHC: 32.7 g/dL (ref 30.0–36.0)
MCV: 93 fL (ref 78.0–100.0)
PLATELETS: 213 10*3/uL (ref 150–400)
RBC: 4.14 MIL/uL (ref 3.87–5.11)
RDW: 13 % (ref 11.5–15.5)
WBC: 7.3 10*3/uL (ref 4.0–10.5)

## 2016-07-30 LAB — SURGICAL PCR SCREEN
MRSA, PCR: NEGATIVE
STAPHYLOCOCCUS AUREUS: NEGATIVE

## 2016-07-30 SURGERY — CYSTOURETEROSCOPY, WITH RETROGRADE PYELOGRAM AND STENT INSERTION
Anesthesia: General | Laterality: Left

## 2016-07-30 MED ORDER — LACTATED RINGERS IV SOLN
INTRAVENOUS | Status: DC
  Administered 2016-07-30 (×3): via INTRAVENOUS

## 2016-07-30 MED ORDER — ONDANSETRON HCL 4 MG/2ML IJ SOLN
INTRAMUSCULAR | Status: DC | PRN
  Administered 2016-07-30: 4 mg via INTRAVENOUS

## 2016-07-30 MED ORDER — FENTANYL CITRATE (PF) 100 MCG/2ML IJ SOLN
25.0000 ug | INTRAMUSCULAR | Status: DC | PRN
  Administered 2016-07-30 (×2): 50 ug via INTRAVENOUS

## 2016-07-30 MED ORDER — FENTANYL CITRATE (PF) 100 MCG/2ML IJ SOLN
INTRAMUSCULAR | Status: AC
  Filled 2016-07-30: qty 2

## 2016-07-30 MED ORDER — HYDROMORPHONE HCL 1 MG/ML IJ SOLN: 0.2500 mg | INTRAMUSCULAR | Status: DC | PRN

## 2016-07-30 MED ORDER — ACETAMINOPHEN 325 MG PO TABS: 650.0000 mg | ORAL_TABLET | Freq: Four times a day (QID) | ORAL | Status: DC | PRN

## 2016-07-30 MED ORDER — KETOROLAC TROMETHAMINE 30 MG/ML IJ SOLN
30.0000 mg | Freq: Four times a day (QID) | INTRAMUSCULAR | Status: DC
  Administered 2016-07-30 – 2016-07-31 (×7): 30 mg via INTRAVENOUS
  Filled 2016-07-30 (×6): qty 1

## 2016-07-30 MED ORDER — DEXTROSE 5 % IV SOLN
1.0000 g | INTRAVENOUS | Status: DC
  Administered 2016-07-30 – 2016-07-31 (×3): 1 g via INTRAVENOUS
  Filled 2016-07-30 (×2): qty 10

## 2016-07-30 MED ORDER — 0.9 % SODIUM CHLORIDE (POUR BTL) OPTIME
TOPICAL | Status: DC | PRN
  Administered 2016-07-30: 1000 mL

## 2016-07-30 MED ORDER — DEXTROSE 5 % IV SOLN
1.0000 g | Freq: Once | INTRAVENOUS | Status: DC
  Filled 2016-07-30: qty 10

## 2016-07-30 MED ORDER — PROMETHAZINE HCL 25 MG/ML IJ SOLN: 6.2500 mg | INTRAMUSCULAR | Status: DC | PRN

## 2016-07-30 MED ORDER — MORPHINE SULFATE (PF) 2 MG/ML IV SOLN
2.0000 mg | INTRAVENOUS | Status: DC | PRN
  Administered 2016-07-30 – 2016-07-31 (×10): 2 mg via INTRAVENOUS
  Filled 2016-07-30 (×10): qty 1

## 2016-07-30 MED ORDER — POTASSIUM CHLORIDE IN NACL 20-0.9 MEQ/L-% IV SOLN
INTRAVENOUS | Status: DC
  Administered 2016-07-30: 18:00:00 via INTRAVENOUS
  Administered 2016-07-30: 125 mL via INTRAVENOUS
  Administered 2016-07-31 (×2): via INTRAVENOUS
  Filled 2016-07-30 (×4): qty 1000

## 2016-07-30 MED ORDER — PROPOFOL 10 MG/ML IV BOLUS
INTRAVENOUS | Status: DC | PRN
  Administered 2016-07-30: 200 mg via INTRAVENOUS

## 2016-07-30 MED ORDER — SODIUM CHLORIDE 0.9 % IR SOLN
Status: DC | PRN
  Administered 2016-07-30: 1000 mL

## 2016-07-30 MED ORDER — ONDANSETRON HCL 4 MG/2ML IJ SOLN
4.0000 mg | Freq: Four times a day (QID) | INTRAMUSCULAR | Status: DC | PRN
  Administered 2016-07-30: 4 mg via INTRAVENOUS
  Filled 2016-07-30: qty 2

## 2016-07-30 MED ORDER — ACETAMINOPHEN 650 MG RE SUPP: 650.0000 mg | Freq: Four times a day (QID) | RECTAL | Status: DC | PRN

## 2016-07-30 MED ORDER — FENTANYL CITRATE (PF) 100 MCG/2ML IJ SOLN
INTRAMUSCULAR | Status: DC | PRN
  Administered 2016-07-30 (×2): 50 ug via INTRAVENOUS

## 2016-07-30 MED ORDER — LIDOCAINE 2% (20 MG/ML) 5 ML SYRINGE
INTRAMUSCULAR | Status: DC | PRN
  Administered 2016-07-30: 100 mg via INTRAVENOUS

## 2016-07-30 MED ORDER — TAMSULOSIN HCL 0.4 MG PO CAPS: 0.4000 mg | ORAL_CAPSULE | Freq: Every day | ORAL | 1 refills | Status: DC | PRN

## 2016-07-30 MED ORDER — ONDANSETRON HCL 4 MG PO TABS: 4.0000 mg | ORAL_TABLET | Freq: Four times a day (QID) | ORAL | Status: DC | PRN

## 2016-07-30 MED ORDER — SODIUM CHLORIDE 0.9 % IR SOLN
Status: DC | PRN
  Administered 2016-07-30: 3000 mL

## 2016-07-30 MED ORDER — MIDAZOLAM HCL 5 MG/5ML IJ SOLN
INTRAMUSCULAR | Status: DC | PRN
  Administered 2016-07-30: 2 mg via INTRAVENOUS

## 2016-07-30 MED ORDER — MEPERIDINE HCL 50 MG/ML IJ SOLN: 6.2500 mg | INTRAMUSCULAR | Status: DC | PRN

## 2016-07-30 MED ORDER — OXYCODONE-ACETAMINOPHEN 5-325 MG PO TABS: 1.0000 | ORAL_TABLET | Freq: Four times a day (QID) | ORAL | 0 refills | Status: DC | PRN

## 2016-07-30 MED ORDER — DEXAMETHASONE SODIUM PHOSPHATE 10 MG/ML IJ SOLN
INTRAMUSCULAR | Status: DC | PRN
  Administered 2016-07-30: 10 mg via INTRAVENOUS

## 2016-07-30 SURGICAL SUPPLY — 23 items
BAG URO CATCHER STRL LF (MISCELLANEOUS) ×3 IMPLANT
BASKET DAKOTA 1.9FR 11X120 (BASKET) ×3 IMPLANT
BASKET LASER NITINOL 1.9FR (BASKET) IMPLANT
CATH FOLEY LATEX FREE 20FR (CATHETERS)
CATH FOLEY LF 20FR (CATHETERS) IMPLANT
CATH INTERMIT  6FR 70CM (CATHETERS) ×3 IMPLANT
CLOTH BEACON ORANGE TIMEOUT ST (SAFETY) ×3 IMPLANT
FIBER LASER TRAC TIP (UROLOGICAL SUPPLIES) IMPLANT
GLOVE BIO SURGEON STRL SZ8 (GLOVE) ×3 IMPLANT
GOWN STRL REUS W/TWL XL LVL3 (GOWN DISPOSABLE) ×3 IMPLANT
GUIDEWIRE ANG ZIPWIRE 038X150 (WIRE) ×3 IMPLANT
GUIDEWIRE STR DUAL SENSOR (WIRE) ×3 IMPLANT
IV NS 1000ML (IV SOLUTION) ×2
IV NS 1000ML BAXH (IV SOLUTION) ×1 IMPLANT
MANIFOLD NEPTUNE II (INSTRUMENTS) ×3 IMPLANT
PACK CYSTO (CUSTOM PROCEDURE TRAY) ×3 IMPLANT
SHEATH ACCESS URETERAL 54CM (SHEATH) ×3 IMPLANT
STENT CONTOUR 6FRX26X.038 (STENTS) IMPLANT
STENT URET 6FRX26 CONTOUR (STENTS) ×3 IMPLANT
SYR CONTROL 10ML LL (SYRINGE) ×3 IMPLANT
TUBE FEEDING 8FR 16IN STR KANG (MISCELLANEOUS) ×3 IMPLANT
TUBING CONNECTING 10 (TUBING) ×2 IMPLANT
TUBING CONNECTING 10' (TUBING) ×1

## 2016-07-30 NOTE — Progress Notes (Addendum)
[facility] for her procedure (cystoscopy with ureteral stent placement) . Transportation set up with [facility]. Pt will be picked up at 1200 to get to the OR by 1300 for her procedure at 1430. Will continue to monitor  Pt will transfer to [facility]4E room 01 after procedure. Charge-RN on unit made aware and [facility] prior to pt leaving at 1200.

## 2016-07-30 NOTE — Progress Notes (Addendum)
07-30-13  1145 am [facility] report given to RN. [facility].  Ramar Nobrega, Kae HellerMiranda Lynn, RN

## 2016-07-30 NOTE — Progress Notes (Signed)
Patient ID: Krystal Humphrey, female   DOB: 06/30/1986, 30 y.o.   MRN: [id number]  Assessment: Abdominal/flank pain - She has an 8 mm stone with Hounsfield units of approximately 950 located in the left renal pelvis by CT scan.  She was admitted for pain control and continues to have unremitting pain.  She did have some mesenteric adenopathy on her CT scan but has no elevation of her white blood cell count and likely that her stone is intermittently obstructing her ureteropelvic junction.  I discussed the treatment options with her again today.  Lithotripsy would not be a good option initially since she is currently in pending in the lithotripter was not available so we discussed stent placement with eventual lithotripsy versus ureteroscopy and laser lithotripsy.  I gone over both of these procedure with her in detail.  We discussed the risks and palpitations, the probability of success, the outpatient nature of the procedure and anticipated postoperative course.  She has elected to proceed with surgical intervention today.  Plan: 1.  She will be taken to the OR today for cystoscopy, left retrograde pyelogram and double-J stent placement versus laser lithotripsy. 2.  She will need to be [facility] over to [facility] for this procedure.    Subjective: Patient reports that she continues to have pain that she makes 10/10 despite the fact that she is sitting in her bed and appears to be in no apparent distress subjectively. She said she "just wants the pain to go away".  Objective: Vital signs in last 24 hours: Temp:  [97.7 F (36.5 C)-98.1 F (36.7 C)] 97.9 F (36.6 C) (09/01 0529) Pulse Rate:  [52-79] 52 (09/01 0529) Resp:  [16-18] 16 (09/01 0529) BP: (102-126)/(56-71) 102/56 (09/01 0529) SpO2:  [99 %-100 %] 99 % (09/01 0529)A  Intake/Output from previous day: No intake/output data recorded. Intake/Output this shift: No intake/output data recorded.  Past Medical History:  Diagnosis Date  .  Anxiety   . Depression   . GERD (gastroesophageal reflux disease)   . Panic attack     Physical Exam:  General - Alert and oriented and in no apparent distress. Lungs - Normal respiratory effort, chest expands symmetrically.  Abdomen - Soft, non-tender & non-distended.  Lab Results:  Recent Labs  07/29/16 1250 07/30/16 0437  WBC 7.2 7.3  HGB 13.8 12.6  HCT 41.6 38.5   BMET  Recent Labs  07/29/16 1250 07/30/16 0437  NA 138 139  K 3.9 3.2*  CL 106 103  CO2 27 28  GLUCOSE 87 90  BUN 9 11  CREATININE 0.80 0.86  CALCIUM 9.2 8.7*   No results for input(s): LABURIN in the last 72 hours. Results for orders placed or performed in visit on 09/21/11  Strep B DNA probe     Status: None   Collection Time: 08/20/11 12:00 AM  Result Value Ref Range Status   GBS Negative      Studies/Results: US Renal  Result Date: 07/29/2016 CLINICAL DATA:  Left flank pain for 3 days. EXAM: RENAL / URINARY TRACT ULTRASOUND COMPLETE COMPARISON:  CT abdomen and pelvis 07/27/2016 FINDINGS: Right Kidney: Length: 10.8 cm. Echogenic stone demonstrated in the mid/lower pole pole the right kidney measuring 3 mm diameter. No hydronephrosis. Normal parenchymal echotexture and thickness. No focal mass lesions. Left Kidney: Length: 11.5 cm. Echogenic stone demonstrated in the mid portion of the left kidney over the renal pelvis measuring 8 mm diameter. No hydronephrosis. Normal parenchymal echotexture and thickness. No focal mass lesions.  Bladder: Bladder is decompressed due to recent catheterization and is not able to be evaluated. IMPRESSION: Bilateral nonobstructing intrarenal stones.  No hydronephrosis Electronically Signed   By: Burman NievesWilliam  Stevens M.D.   On: 07/29/2016 23:16      Krystal Humphrey C 07/30/2016, 7:11 AM

## 2016-07-30 NOTE — Progress Notes (Signed)
  Pt admitted to the unit. Pt is stable, alert and oriented per baseline. Oriented to room, staff, and call bell. Educated to call for any assistance. Bed in lowest position, call bell within reach- will continue to monitor. 

## 2016-07-30 NOTE — Progress Notes (Signed)
Patient stated that she had the urge to urinate but could not empty her bladder- pt was bladder scanned by this RN with a residual of 43cc. Will continue to monitor

## 2016-07-30 NOTE — Treatment Plan (Signed)
Please keep patient NPO this morning (friday 9/1) as [facility] are [facility] with ureteral stent placement +/- left USE.

## 2016-07-30 NOTE — Progress Notes (Signed)
[facility], patient was assigned bed on 4E [facility].  Jawann Urbani, Kae HellerMiranda Lynn, RN

## 2016-07-30 NOTE — Progress Notes (Signed)
[facility] PROGRESS NOTE  Krystal AugustBrandi Humphrey ZOX:[id number] DOB: 04-01-1986 DOA: 07/29/2016   PCP: Jeanine Luzalone, Gregory, FNP     Assessment/Plan: Principal Problem:   Abdominal discomfort Active Problems:   Nephrolithiasis   Flank pain     30 y.o. female with medical history significant of anxiety/depression presenting with left flank pain.  Patient was seen in ER Monday AM and diagnosed with large (8mm) left kidney stone.  Minimal UOP, pain medication not helping, nauseated with some vomiting.  Pain is in left flank but radiates across back when she moves.  It wakes her up from sleep at night, unable to roll over due to pain.  100.6 Monday, no fever since. Patient seen by urology Ihor GullyMark Ottelin, MD. Due to persistent pain and discomfort , urology recommended cystoscopy, left ureteral stent placement with possible left ureteroscopy and USE, although favoring cystoscopy with left ureteral stent alone to reassess symptoms thereafter  Assessment and plan Nephrolithiasis Patient seen by urology, plan for cystoscopy, left ureteral stent placement with possible left ureteroscopy and USE Empiric Rocephin keep NPO until procedure complete today ,Pt will be transporting directly to [facility] OR for her procedure (cystoscopy with ureteral stent placement   DVT prophylaxsis  SCDs   Code Status:  Full code     Family Communication: Discussed in detail with the patient, all imaging results, lab results explained to the patient   [facility]     Consultants:  Urology  Procedures:  None  Antibiotics: Anti-infectives    None         HPI/Subjective: Continues to have 8-9/10 pain despite appearing comfortable. Left back pain  Objective: Vitals:   07/30/16 0000 07/30/16 0003 07/30/16 0107 07/30/16 0529  BP: 117/64 117/64 125/71 (!) 102/56  Pulse: 60 68 (!) 56 (!) 52  Resp:  16 18 16   Temp:   97.7 F (36.5 C) 97.9 F (36.6 C)  TempSrc:    Oral Oral  SpO2: 99% 100% 100% 99%    Intake/Output Summary (Last 24 hours) at 07/30/16 0858 Last data filed at 07/30/16 0700  Gross per 24 hour  Intake                0 ml  Output                0 ml  Net                0 ml    Exam:  Examination:  General exam: Appears calm and comfortable  Respiratory system: Clear to auscultation. Respiratory effort normal. Cardiovascular system: S1 & S2 heard, RRR. No JVD, murmurs, rubs, gallops or clicks. No pedal edema. Gastrointestinal system: Abdomen is nondistended, soft and nontender. No organomegaly or masses felt. Normal bowel sounds heard. Central nervous system: Alert and oriented. No focal neurological deficits. Extremities: Symmetric 5 x 5 power. Skin: No rashes, lesions or ulcers Psychiatry: Judgement and insight appear normal. Mood & affect appropriate.     Data Reviewed: I have personally reviewed following labs and imaging studies  Micro Results No results found for this or any previous visit (from the past 240 hour(s)).  Radiology Reports Ct Abdomen Pelvis W Contrast  Result Date: 07/27/2016 CLINICAL DATA:  Upper abdominal pain radiating to the right side. EXAM: CT ABDOMEN AND PELVIS WITH CONTRAST TECHNIQUE: Multidetector CT imaging of the abdomen and pelvis was performed using the standard protocol following bolus administration of intravenous contrast. CONTRAST:  100mL  ISOVUE-300 IOPAMIDOL (ISOVUE-300) INJECTION 61% COMPARISON:  Abdominal ultrasound 07/27/2016 FINDINGS: Lower chest: No pulmonary nodules. No visible pleural or pericardial effusion. Hepatobiliary: Normal hepatic size and contours without focal liver lesion. No perihepatic ascites. No intra- or extrahepatic biliary dilatation. Normal gallbladder. Pancreas: Normal pancreatic contours and enhancement. No peripancreatic fluid collection or pancreatic ductal dilatation. Spleen: Normal. Adrenals/Urinary Tract: Normal adrenal glands. Nonobstructing 2 mm right renal  calculus. There is a calculus at the left renal hilum measuring 8 mm with associated pelviectasis. No ureterolithiasis. Urinary bladder is normal. Stomach/Bowel: No abnormal bowel dilatation. No bowel wall thickening or adjacent fat stranding to indicate acute inflammation. No abdominal fluid collection. Normal appendix. Vascular/Lymphatic: Normal course and caliber of the major abdominal vessels. There are multiple prominent right lower quadrant lymph nodes, measuring up to 1.2 cm (series 5, image 65). No pelvic adenopathy. Reproductive: Uterus is normal. The ovaries are normal. Trace free fluid in the pelvis, likely physiologic. Musculoskeletal: No lytic or blastic osseous lesion. Normal visualized extrathoracic and extraperitoneal soft tissues. Other: No contributory non-categorized findings. IMPRESSION: 1. Large, 8 mm renal calculus at the left renal hilum with mild dilatation of the left renal collecting system. 2. Multiple clustered right lower quadrant mesenteric lymph nodes in a pattern suggesting mesenteric adenitis. 3. Nonobstructing 2 mm right renal calculus. 1. Electronically Signed   By: Deatra Robinson M.D.   On: 07/27/2016 06:58   US Renal  Result Date: 07/29/2016 CLINICAL DATA:  Left flank pain for 3 days. EXAM: RENAL / URINARY TRACT ULTRASOUND COMPLETE COMPARISON:  CT abdomen and pelvis 07/27/2016 FINDINGS: Right Kidney: Length: 10.8 cm. Echogenic stone demonstrated in the mid/lower pole pole the right kidney measuring 3 mm diameter. No hydronephrosis. Normal parenchymal echotexture and thickness. No focal mass lesions. Left Kidney: Length: 11.5 cm. Echogenic stone demonstrated in the mid portion of the left kidney over the renal pelvis measuring 8 mm diameter. No hydronephrosis. Normal parenchymal echotexture and thickness. No focal mass lesions. Bladder: Bladder is decompressed due to recent catheterization and is not able to be evaluated. IMPRESSION: Bilateral nonobstructing intrarenal stones.   No hydronephrosis Electronically Signed   By: Burman Nieves M.D.   On: 07/29/2016 23:16   US Abdomen Limited  Result Date: 07/27/2016 CLINICAL DATA:  Right upper quadrant pain since yesterday evening. EXAM: US ABDOMEN LIMITED - RIGHT UPPER QUADRANT COMPARISON:  None. FINDINGS: Gallbladder: No gallstones or mural thickening. Gallbladder wall measures 2 mm in thickness. No sonographic Murphy sign noted by sonographer. Common bile duct: Diameter: 3 mm Liver: No focal lesion identified. Within normal limits in parenchymal echogenicity. IMPRESSION: Normal liver, gallbladder and bile ducts. Electronically Signed   By: Ellery Plunk M.D.   On: 07/27/2016 04:35     CBC  Recent Labs Lab 07/26/16 2329 07/29/16 1250 07/30/16 0437  WBC 12.3* 7.2 7.3  HGB 15.7* 13.8 12.6  HCT 45.3 41.6 38.5  PLT 256 246 213  MCV 91.3 92.2 93.0  MCH 31.7 30.6 30.4  MCHC 34.7 33.2 32.7  RDW 12.9 12.9 13.0    Chemistries   Recent Labs Lab 07/26/16 2329 07/29/16 1250 07/30/16 0437  NA 136 138 139  K 4.0 3.9 3.2*  CL 104 106 103  CO2 20* 27 28  GLUCOSE 91 87 90  BUN 14 9 11   CREATININE 0.77 0.80 0.86  CALCIUM 9.4 9.2 8.7*  AST 34  --   --   ALT 35  --   --   ALKPHOS 68  --   --  BILITOT 1.4*  --   --    ------------------------------------------------------------------------------------------------------------------ estimated creatinine clearance is 114.2 mL/min (by C-G formula based on SCr of 0.86 mg/dL). ------------------------------------------------------------------------------------------------------------------ No results for input(s): HGBA1C in the last 72 hours. ------------------------------------------------------------------------------------------------------------------ No results for input(s): CHOL, HDL, LDLCALC, TRIG, CHOLHDL, LDLDIRECT in the last 72 hours. ------------------------------------------------------------------------------------------------------------------ No  results for input(s): TSH, T4TOTAL, T3FREE, THYROIDAB in the last 72 hours.  Invalid input(s): FREET3 ------------------------------------------------------------------------------------------------------------------ No results for input(s): VITAMINB12, FOLATE, FERRITIN, TIBC, IRON, RETICCTPCT in the last 72 hours.  Coagulation profile No results for input(s): INR, PROTIME in the last 168 hours.  No results for input(s): DDIMER in the last 72 hours.  Cardiac Enzymes No results for input(s): CKMB, TROPONINI, MYOGLOBIN in the last 168 hours.  Invalid input(s): CK ------------------------------------------------------------------------------------------------------------------ Invalid input(s): POCBNP   CBG: No results for input(s): GLUCAP in the last 168 hours.     Studies: US Renal  Result Date: 07/29/2016 CLINICAL DATA:  Left flank pain for 3 days. EXAM: RENAL / URINARY TRACT ULTRASOUND COMPLETE COMPARISON:  CT abdomen and pelvis 07/27/2016 FINDINGS: Right Kidney: Length: 10.8 cm. Echogenic stone demonstrated in the mid/lower pole pole the right kidney measuring 3 mm diameter. No hydronephrosis. Normal parenchymal echotexture and thickness. No focal mass lesions. Left Kidney: Length: 11.5 cm. Echogenic stone demonstrated in the mid portion of the left kidney over the renal pelvis measuring 8 mm diameter. No hydronephrosis. Normal parenchymal echotexture and thickness. No focal mass lesions. Bladder: Bladder is decompressed due to recent catheterization and is not able to be evaluated. IMPRESSION: Bilateral nonobstructing intrarenal stones.  No hydronephrosis Electronically Signed   By: Burman Nieves M.D.   On: 07/29/2016 23:16      No results found for: HGBA1C Lab Results  Component Value Date   CREATININE 0.86 07/30/2016       Scheduled Meds: . ketorolac  30 mg Intravenous Q6H   Continuous Infusions: . lactated ringers 200 mL/hr at 07/30/16 0612     LOS: 0 days     Time spent: >30 MINS    [facility]  [facility] Pager [phone]. If 7PM-7AM, please contact night-coverage at [email or web address], password [facility] 07/30/2016, 8:58 AM  LOS: 0 days

## 2016-07-30 NOTE — Consult Note (Signed)
Follow Up Urology Consult Note   Requesting Attending Physician:  Richarda Overlie, MD Service Requesting Consult:  Medicine Service Providing Consult: Urology Consulting Attending: Vernie Ammons, MD  Assessment:  Patient is a 30 y.o. female with second presentation to ER in 3 days for diffuse abdominal pain, back pain (L>R), with diarrhea, n/v, low UOP. CT imaging at initial presentation showed bilateral intrarenal stones, L > R, confirmed again in ER on 8/31 RUS with no hydronephrosis bilaterally. AFVSS. No AKI or leukocytosis. U/a not consistent with infection. Query whether symptoms may be related to GI or intraabdominal process, not typical renal calculi/stone symptoms for this patient.   Interval: Continues to have 8-9/10 pain despite appearing comfortable. Left back pain. Afebrile, VSS. Normal Cr & WBC.   Recommendations: 1. Requires carelink to [facility] transfer. This has been communicated with nursing staff. Transfer order placed. 2. Will plan for cystoscopy, left ureteral stent placement with possible left ureteroscopy and USE, although favoring cystoscopy with left ureteral stent alone to reassess symptoms thereafter. We can then treat left renal stone as an elective procedure as an outpatient with USE vs ESWL 3. Please keep NPO until procedure complete today  4. Continue IVF, pain meds & antiemetics PRN 5. Follow up urine culture  Thank you for this consult. Please contact the urology consult pager with any further questions/concerns. Jalene Mullet, MD Urology Surgical Resident   Subjective: Continues to have 8-9/10 pain, frustrated by her pain. Mainly left lower back pain. No abdominal pain. Some nausea overnight, no emesis.    Objective   Vital signs in last 24 hours: BP (!) 102/56 (BP Location: Left Arm)   Pulse (!) 52   Temp 97.9 F (36.6 C) (Oral)   Resp 16   SpO2 99%   Intake/Output last 3 shifts: No intake/output data recorded.  Physical Exam General: NAD, A&O,  resting, appropriate HEENT: [place]/AT, EOMI, MMM Pulmonary: Normal work of breathing on RA Cardiovascular: Regular rate & rhythm, HDS, adequate peripheral perfusion Abdomen: soft, NTTP, nondistended, no suprapubic tenderness GU: spontaneously voiding, mild left CVAT Extremities: warm and well perfused, no edema Neuro: Appropriate, no focal neurological deficits  Most Recent Labs: Lab Results  Component Value Date   WBC 7.3 07/30/2016   HGB 12.6 07/30/2016   HCT 38.5 07/30/2016   PLT 213 07/30/2016    Lab Results  Component Value Date   NA 139 07/30/2016   K 3.2 (L) 07/30/2016   CL 103 07/30/2016   CO2 28 07/30/2016   BUN 11 07/30/2016   CREATININE 0.86 07/30/2016   CALCIUM 8.7 (L) 07/30/2016   MG 1.9 07/22/2009   PHOS 3.3 11/04/2009    Lab Results  Component Value Date   ALKPHOS 68 07/26/2016   BILITOT 1.4 (H) 07/26/2016   BILIDIR <0.1 04/03/2009   PROT 7.4 07/26/2016   ALBUMIN 4.5 07/26/2016   ALT 35 07/26/2016   AST 34 07/26/2016    Lab Results  Component Value Date   INR 1.2 07/23/2009   APTT 33 04/04/2009     Urine Culture: Pending   IMAGING: US Renal  Result Date: 07/29/2016 CLINICAL DATA:  Left flank pain for 3 days. EXAM: RENAL / URINARY TRACT ULTRASOUND COMPLETE COMPARISON:  CT abdomen and pelvis 07/27/2016 FINDINGS: Right Kidney: Length: 10.8 cm. Echogenic stone demonstrated in the mid/lower pole pole the right kidney measuring 3 mm diameter. No hydronephrosis. Normal parenchymal echotexture and thickness. No focal mass lesions. Left Kidney: Length: 11.5 cm. Echogenic stone demonstrated in the mid portion  of the left kidney over the renal pelvis measuring 8 mm diameter. No hydronephrosis. Normal parenchymal echotexture and thickness. No focal mass lesions. Bladder: Bladder is decompressed due to recent catheterization and is not able to be evaluated. IMPRESSION: Bilateral nonobstructing intrarenal stones.  No hydronephrosis Electronically Signed   By:  Burman NievesWilliam  Stevens M.D.   On: 07/29/2016 23:16   Patient was seen, examined,treatment plan was discussed with the resident.  I have directly reviewed the clinical findings, lab, imaging studies and management of this patient in detail. I have made the necessary changes and/or additions to the above noted documentation, and agree with the documentation, as recorded by the resident.

## 2016-07-30 NOTE — Transfer of Care (Signed)
Immediate Anesthesia Transfer of Care Note  Patient: Krystal Humphrey  Procedure(s) Performed: Procedure(s): CYSTOSCOPY WITH LEFT  RETROGRADE PYELOGRAM AND LEFT STENT PLACEMENT (Left)  Patient Location: PACU  Anesthesia Type:General  Level of Consciousness: awake  Airway & Oxygen Therapy: Patient Spontanous Breathing and Patient connected to face mask oxygen  Post-op Assessment: Report given to RN and Post -op Vital signs reviewed and stable  Post vital signs: Reviewed and stable  Last Vitals:  Vitals:   07/30/16 0529 07/30/16 1201  BP: (!) 102/56 122/65  Pulse: (!) 52 (!) 57  Resp: 16 16  Temp: 36.6 C 36.8 C    Last Pain:  Vitals:   07/30/16 1245  TempSrc:   PainSc: 5       Patients Stated Pain Goal: 4 (07/30/16 1136)  Complications: No apparent anesthesia complications

## 2016-07-30 NOTE — Anesthesia Procedure Notes (Signed)
Procedure Name: LMA Insertion Date/Time: 07/30/2016 1:46 PM Performed by: Leroy LibmanEARDON, Suraiya Dickerson L Patient Re-evaluated:Patient Re-evaluated prior to inductionOxygen Delivery Method: Circle system utilized Preoxygenation: Pre-oxygenation with 100% oxygen Intubation Type: IV induction Ventilation: Mask ventilation without difficulty LMA: LMA inserted LMA Size: 4.0 Number of attempts: 1 Placement Confirmation: positive ETCO2 and breath sounds checked- equal and bilateral Tube secured with: Tape Dental Injury: Teeth and Oropharynx as per pre-operative assessment

## 2016-07-30 NOTE — Anesthesia Preprocedure Evaluation (Signed)
Anesthesia Evaluation    Airway Mallampati: III  TM Distance: >3 FB Neck ROM: Full    Dental no notable dental hx.    Pulmonary former smoker,    Pulmonary exam normal breath sounds clear to auscultation       Cardiovascular Normal cardiovascular exam Rhythm:Regular Rate:Normal     Neuro/Psych PSYCHIATRIC DISORDERS Anxiety Depression    GI/Hepatic PUD, GERD  ,  Endo/Other  Morbid obesity  Renal/GU Renal disease     Musculoskeletal   Abdominal   Peds  Hematology  (+) anemia ,   Anesthesia Other Findings   Reproductive/Obstetrics                             Anesthesia Physical  Anesthesia Plan  ASA: III  Anesthesia Plan: General   Post-op Pain Management:    Induction:   Airway Management Planned: LMA  Additional Equipment:   Intra-op Plan:   Post-operative Plan: Extubation in OR  Informed Consent: I have reviewed the patients History and Physical, chart, labs and discussed the procedure including the risks, benefits and alternatives for the proposed anesthesia with the patient or authorized representative who has indicated his/her understanding and acceptance.   Dental advisory given  Plan Discussed with: CRNA  Anesthesia Plan Comments:         Anesthesia Quick Evaluation

## 2016-07-30 NOTE — Anesthesia Postprocedure Evaluation (Signed)
Anesthesia Post Note  Patient: Krystal Humphrey  Procedure(s) Performed: Procedure(s) (LRB): CYSTOSCOPY WITH LEFT  RETROGRADE PYELOGRAM AND LEFT STENT PLACEMENT (Left)  Patient location during evaluation: PACU Anesthesia Type: General Level of consciousness: sedated and patient cooperative Pain management: pain level controlled Vital Signs Assessment: post-procedure vital signs reviewed and stable Respiratory status: spontaneous breathing Cardiovascular status: stable Anesthetic complications: no    Last Vitals:  Vitals:   07/30/16 1527 07/30/16 2001  BP: 120/64 (!) 114/55  Pulse: (!) 55 68  Resp: 18 18  Temp: 36.6 C 36.6 C    Last Pain:  Vitals:   07/30/16 2004  TempSrc:   PainSc: 8                  Lewie LoronJohn Rashaud Ybarbo

## 2016-07-30 NOTE — Op Note (Signed)
.  Preoperative diagnosis: Left ureteral stone  Postoperative diagnosis: Same  Procedure: 1 cystoscopy 2. Left retrograde pyelography 3.  Intraoperative fluoroscopy, under one hour, with interpretation 4. Left 6 x 26 JJ stent placement  Attending: Wilkie AyePatrick Celia Gibbons MD  Resident: Jalene MulletMatthew Macey, MD  Anesthesia: General  Estimated blood loss: None  Drains: Left 6 x 26 JJ ureteral stent without tether  Specimens: none  Antibiotics: rocephin  Findings: left proximal ureteral stone. Moderate hydronephrosis. No masses/lesions in the bladder. Ureteral orifices in normal anatomic location.  Indications: Patient is a 30 year old female with a history of left renal stone and intractable pain.  After discussing treatment options, they decided proceed with left stent placement.  Procedure her in detail: The patient was brought to the operating room and a brief timeout was done to ensure correct patient, correct procedure, correct site.  General anesthesia was administered patient was placed in dorsal lithotomy position.  Their genitalia was then prepped and draped in usual sterile fashion.  A rigid 22 French cystoscope was passed in the urethra and the bladder.  Bladder was inspected free masses or lesions.  the ureteral orifices were in the normal orthotopic locations.  a 6 french ureteral catheter was then instilled into the left ureteral orifice.  a gentle retrograde was obtained and findings noted above.  we then placed a zip wire through the ureteral catheter and advanced up to the renal pelvis.    We then placed a 6 x 26 double-j ureteral stent over the original zip wire.  We then removed the wire and good coil was noted in the the renal pelvis under fluoroscopy and the bladder under direct vision.  A foley catheter was then placed. the bladder was then drained and this concluded the procedure which was well tolerated by patient.  Complications: None  Condition: Stable, extubated, transferred to  PACU  Plan: Patient is to be discharged home. She will followup in 4-5 days to discuss ESWL versus ureteroscopy

## 2016-07-31 DIAGNOSIS — N200 Calculus of kidney: Principal | ICD-10-CM

## 2016-07-31 LAB — COMPREHENSIVE METABOLIC PANEL
ALT: 63 U/L — ABNORMAL HIGH (ref 14–54)
ANION GAP: 5 (ref 5–15)
AST: 44 U/L — AB (ref 15–41)
Albumin: 4.1 g/dL (ref 3.5–5.0)
Alkaline Phosphatase: 58 U/L (ref 38–126)
BILIRUBIN TOTAL: 0.3 mg/dL (ref 0.3–1.2)
BUN: 11 mg/dL (ref 6–20)
CO2: 25 mmol/L (ref 22–32)
Calcium: 9 mg/dL (ref 8.9–10.3)
Chloride: 108 mmol/L (ref 101–111)
Creatinine, Ser: 0.71 mg/dL (ref 0.44–1.00)
Glucose, Bld: 149 mg/dL — ABNORMAL HIGH (ref 65–99)
POTASSIUM: 4.4 mmol/L (ref 3.5–5.1)
Sodium: 138 mmol/L (ref 135–145)
TOTAL PROTEIN: 7.2 g/dL (ref 6.5–8.1)

## 2016-07-31 LAB — CBC
HEMATOCRIT: 38.9 % (ref 36.0–46.0)
Hemoglobin: 13.2 g/dL (ref 12.0–15.0)
MCH: 30.8 pg (ref 26.0–34.0)
MCHC: 33.9 g/dL (ref 30.0–36.0)
MCV: 90.9 fL (ref 78.0–100.0)
PLATELETS: 262 10*3/uL (ref 150–400)
RBC: 4.28 MIL/uL (ref 3.87–5.11)
RDW: 12.9 % (ref 11.5–15.5)
WBC: 10 10*3/uL (ref 4.0–10.5)

## 2016-07-31 LAB — URINE CULTURE: Culture: NO GROWTH

## 2016-07-31 MED ORDER — OXYCODONE-ACETAMINOPHEN 10-325 MG PO TABS: 1.0000 | ORAL_TABLET | Freq: Three times a day (TID) | ORAL | 0 refills | Status: DC | PRN

## 2016-07-31 MED ORDER — TAMSULOSIN HCL 0.4 MG PO CAPS: 0.4000 mg | ORAL_CAPSULE | Freq: Every day | ORAL | 1 refills | Status: DC | PRN

## 2016-07-31 NOTE — Discharge Summary (Signed)
Krystal Humphrey, is a 30 y.o. female  DOB 1986-03-31  MRN [id number].  Admission date:  07/29/2016  Admitting Physician  Jonah Blue, MD  Discharge Date:  07/31/2016   Primary MD  Jeanine Luz, FNP  Recommendations for primary care physician for things to follow:  - Please check CBC, BMP during next visit - Patient to follow with neurology this to stay or Thursday, and plan for lithotripsy on Thursday.  Admission Diagnosis  Kidney stone [N20.0] Flank pain [R10.9]   Discharge Diagnosis  Kidney stone [N20.0] Flank pain [R10.9]    Principal Problem:   Abdominal discomfort Active Problems:   Nephrolithiasis   Flank pain      Past Medical History:  Diagnosis Date  . Anxiety   . Depression   . GERD (gastroesophageal reflux disease)   . Panic attack     Past Surgical History:  Procedure Laterality Date  . CYSTOSCOPY WITH RETROGRADE PYELOGRAM, URETEROSCOPY AND STENT PLACEMENT Left 07/30/2016   Procedure: CYSTOSCOPY WITH LEFT  RETROGRADE PYELOGRAM AND LEFT STENT PLACEMENT;  Surgeon: Malen Gauze, MD;  Location: [facility];  Service: Urology;  Laterality: Left;  . KNEE ARTHROSCOPY     R knee  . KNEE SURGERY    . TONSILLECTOMY         History of present illness and  Hospital Course:     Kindly see H&P for history of present illness and admission details, please review complete Labs, Consult reports and Test reports for all details in brief  HPI  from the history and physical done on the day of admission 07/29/2016 HPI: Krystal Humphrey is a 30 y.o. female with medical history significant of anxiety/depression presenting with left flank pain.  Patient was seen in ER Monday AM and diagnosed with large (8mm) left kidney stone.  Minimal UOP, pain medication not helping, nauseated with some vomiting.  Pain is in left flank but radiates across back when she moves.  It wakes her up from sleep at night,  unable to roll over due to pain.  100.6 Monday, no fever since.  Feels like she needs to void but minimal UOP when trying.  Able to drink some but stops when she gets nauseated.  She did have diarrhea, but this was last noted on Tuesday.   ED Course: Per PA-C Hedges: Patient presents with 8 mm stone in pain. She was recently seen and evaluated discharged home on oral pain medication. She continues to endorse pain, nausea or vomiting. Urology was consult and who instructed to give patient Tylenol in addition to a party given pain medication, consult resident on-call for evaluation in the ED.  She continues to endorse pain after Toradol, Dilaudid, acetaminophen. Resident on-call consult to evaluate the patient here in the ED.  Per PA-C Humes: 9:24 PM Case discussed with Dr. Regis Bill of urology who has evaluated the patient. He recommends observation admission to medicine given that right stone is nonobstructing and the left stone is not truly an obstructive stone. Urology expresses that is is  less likely for this left renal stone to be causing the patient's abdominal pain. Dr. Regis Bill also recommends repeat UA and culture with in and out catheter to collect specimen. Urology plans to reevaluate in the morning. If patient improving, they will continue to follow her outpatient. If no improvement in pain tomorrow, [facility]. Will discuss case with [facility].  9:59 PM Case discussed with Dr. Ophelia Charter of [facility] who requests ultrasound prior to admission to evaluate for any worsening of dilatation of the left kidney or obstruction. If ultrasound stable compared to prior CT, Dr. Ophelia Charter to evaluate for admission.  11:41 PM Formal US of kidneys with b/l nonobstructing stones. No hydronephrosis. Dr. Ophelia Charter to see for observation admission.    Hospital Course   Symptomatic nephrolithiasis - Patient seen by urology, went for left retrograde pyelography, with left ureteral stent  placement on 9/1, patient is afebrile, no leukocytosis, no indication for antibiotics on discharge, patient to follow with Dr. Thea Silversmith this 2*with deformity x-ray, and the plan is for lithotripsy on Thursday, she reports Percocet 5/325 at controlling her pain, so she'll be discharged on Percocet 10/325, she was given total of 12 tablets prescription.      Discharge Condition:  Stable   Follow UP  Follow-up Information    Wilkie Aye, MD .   Specialty:  Urology Why:  you  will be called for follow-up appointment this Tuesday or Wednesday Contact information: [address] [place] [address] [phone]             Discharge Instructions  and  Discharge Medications     Discharge Instructions    Discharge instructions    Complete by:  As directed   Patient should call [facility] early next week to schedule a postop visit with a NP or PA in the clinic with a KUB, then likely schedule ESWL as outpatient with Dr. Ronne Binning shortly thereafter   Discharge instructions    Complete by:  As directed   Follow with Primary MD Jeanine Luz, FNP in 7 days   Get CBC, CMP,checked  by Primary MD next visit.    Activity: As tolerated with Full fall precautions use walker/cane & assistance as needed   Disposition Home    Diet: Regular diet , with feeding assistance and aspiration precautions.  For Heart failure patients - Check your Weight same time everyday, if you gain over 2 pounds, or you develop in leg swelling, experience more shortness of breath or chest pain, call your Primary MD immediately. Follow Cardiac Low Salt Diet and 1.5 lit/day fluid restriction.   On your next visit with your primary care physician please Get Medicines reviewed and adjusted.   Please request your Prim.MD to go over all Hospital Tests and Procedure/Radiological results at the follow up, please get all Hospital records sent to your Prim MD by signing hospital release before you go  home.   If you experience worsening of your admission symptoms, develop shortness of breath, life threatening emergency, suicidal or homicidal thoughts you must seek medical attention immediately by calling 911 or calling your MD immediately  if symptoms less severe.  You Must read complete instructions/literature along with all the possible adverse reactions/side effects for all the Medicines you take and that have been prescribed to you. Take any new Medicines after you have completely understood and accpet all the possible adverse reactions/side effects.   Do not drive, operating heavy machinery, perform activities at heights,  swimming or participation in water activities or provide baby sitting services if your were admitted for syncope or siezures until you have seen by Primary MD or a Neurologist and advised to do so again.  Do not drive when taking Pain medications.    Do not take more than prescribed Pain, Sleep and Anxiety Medications  Special Instructions: If you have smoked or chewed Tobacco  in the last 2 yrs please stop smoking, stop any regular Alcohol  and or any Recreational drug use.  Wear Seat belts while driving.   Please note  You were cared for by a hospitalist during your hospital stay. If you have any questions about your discharge medications or the care you received while you were in the hospital after you are discharged, you can call the unit and asked to speak with the hospitalist on call if the hospitalist that took care of you is not available. Once you are discharged, your primary care physician will handle any further medical issues. Please note that NO REFILLS for any discharge medications will be authorized once you are discharged, as it is imperative that you return to your primary care physician (or establish a relationship with a primary care physician if you do not have one) for your aftercare needs so that they can reassess your need for medications and  monitor your lab values.   Increase activity slowly    Complete by:  As directed       Medication List    STOP taking these medications   oxyCODONE-acetaminophen 5-325 MG tablet Commonly known as:  PERCOCET/ROXICET Replaced by:  oxyCODONE-acetaminophen 10-325 MG tablet     TAKE these medications   ibuprofen 200 MG tablet Commonly known as:  ADVIL,MOTRIN Take 800 mg by mouth every 6 (six) hours as needed for mild pain.   metoCLOPramide 10 MG tablet Commonly known as:  REGLAN Take 1 tablet (10 mg total) by mouth every 6 (six) hours.   oxyCODONE-acetaminophen 10-325 MG tablet Commonly known as:  PERCOCET Take 1 tablet by mouth every 8 (eight) hours as needed for pain. Replaces:  oxyCODONE-acetaminophen 5-325 MG tablet   tamsulosin 0.4 MG Caps capsule Commonly known as:  FLOMAX Take 1 capsule (0.4 mg total) by mouth daily as needed (flank pain, stent discomfort).         Diet and Activity recommendation: See Discharge Instructions above   Consults obtained - Urology   Major procedures and Radiology Reports - PLEASE review detailed and final reports for all details, in brief -   Procedure: 1 cystoscopy 2. Left retrograde pyelography 3.  Intraoperative fluoroscopy, under one hour, with interpretation 4. Left 6 x 26 JJ stent placement  Attending: Wilkie Aye MD   Ct Abdomen Pelvis W Contrast  Result Date: 07/27/2016 CLINICAL DATA:  Upper abdominal pain radiating to the right side. EXAM: CT ABDOMEN AND PELVIS WITH CONTRAST TECHNIQUE: Multidetector CT imaging of the abdomen and pelvis was performed using the standard protocol following bolus administration of intravenous contrast. CONTRAST:  ISOVUE-300 IOPAMIDOL (ISOVUE-300) INJECTION 61% COMPARISON:  Abdominal ultrasound 07/27/2016 FINDINGS: Lower chest: No pulmonary nodules. No visible pleural or pericardial effusion. Hepatobiliary: Normal hepatic size and contours without focal liver lesion. No perihepatic  ascites. No intra- or extrahepatic biliary dilatation. Normal gallbladder. Pancreas: Normal pancreatic contours and enhancement. No peripancreatic fluid collection or pancreatic ductal dilatation. Spleen: Normal. Adrenals/Urinary Tract: Normal adrenal glands. Nonobstructing 2 mm right renal calculus. There is a calculus at the left renal hilum measuring 8  mm with associated pelviectasis. No ureterolithiasis. Urinary bladder is normal. Stomach/Bowel: No abnormal bowel dilatation. No bowel wall thickening or adjacent fat stranding to indicate acute inflammation. No abdominal fluid collection. Normal appendix. Vascular/Lymphatic: Normal course and caliber of the major abdominal vessels. There are multiple prominent right lower quadrant lymph nodes, measuring up to 1.2 cm (series 5, image 65). No pelvic adenopathy. Reproductive: Uterus is normal. The ovaries are normal. Trace free fluid in the pelvis, likely physiologic. Musculoskeletal: No lytic or blastic osseous lesion. Normal visualized extrathoracic and extraperitoneal soft tissues. Other: No contributory non-categorized findings. IMPRESSION: 1. Large, 8 mm renal calculus at the left renal hilum with mild dilatation of the left renal collecting system. 2. Multiple clustered right lower quadrant mesenteric lymph nodes in a pattern suggesting mesenteric adenitis. 3. Nonobstructing 2 mm right renal calculus. 1. Electronically Signed   By: Deatra Robinson M.D.   On: 07/27/2016 06:58   US Renal  Result Date: 07/29/2016 CLINICAL DATA:  Left flank pain for 3 days. EXAM: RENAL / URINARY TRACT ULTRASOUND COMPLETE COMPARISON:  CT abdomen and pelvis 07/27/2016 FINDINGS: Right Kidney: Length: 10.8 cm. Echogenic stone demonstrated in the mid/lower pole pole the right kidney measuring 3 mm diameter. No hydronephrosis. Normal parenchymal echotexture and thickness. No focal mass lesions. Left Kidney: Length: 11.5 cm. Echogenic stone demonstrated in the mid portion of the left  kidney over the renal pelvis measuring 8 mm diameter. No hydronephrosis. Normal parenchymal echotexture and thickness. No focal mass lesions. Bladder: Bladder is decompressed due to recent catheterization and is not able to be evaluated. IMPRESSION: Bilateral nonobstructing intrarenal stones.  No hydronephrosis Electronically Signed   By: Burman Nieves M.D.   On: 07/29/2016 23:16   US Abdomen Limited  Result Date: 07/27/2016 CLINICAL DATA:  Right upper quadrant pain since yesterday evening. EXAM: US ABDOMEN LIMITED - RIGHT UPPER QUADRANT COMPARISON:  None. FINDINGS: Gallbladder: No gallstones or mural thickening. Gallbladder wall measures 2 mm in thickness. No sonographic Murphy sign noted by sonographer. Common bile duct: Diameter: 3 mm Liver: No focal lesion identified. Within normal limits in parenchymal echogenicity. IMPRESSION: Normal liver, gallbladder and bile ducts. Electronically Signed   By: Ellery Plunk M.D.   On: 07/27/2016 04:35    Micro Results    Recent Results (from the past 240 hour(s))  Urine culture     Status: None   Collection Time: 07/29/16  9:25 PM  Result Value Ref Range Status   Specimen Description URINE, RANDOM  Final   Special Requests NONE  Final   Culture NO GROWTH  Final   Report Status 07/31/2016 FINAL  Final  Surgical PCR screen     Status: None   Collection Time: 07/30/16 10:15 AM  Result Value Ref Range Status   MRSA, PCR NEGATIVE NEGATIVE Final   Staphylococcus aureus NEGATIVE NEGATIVE Final    Comment:        The Xpert SA Assay (FDA approved for NASAL specimens in patients over 77 years of age), is one component of a comprehensive surveillance program.  Test performance has been validated by [facility] for patients greater than or equal to 1 year old. It is not intended to diagnose infection nor to guide or monitor treatment.        Today   Subjective:   Krystal Humphrey today has no headache,no chest  pain, feels much better  wants to go home today. Abdominal pain significantly improved, reports mild amount of hematuria  Objective:   Blood pressure  113/68, pulse 61, temperature 97.5 F (36.4 C), temperature source Oral, resp. rate 20, height 5\' 8"  (1.727 m), weight 88.2 kg (194 lb 8 oz), SpO2 98 %.   Intake/Output Summary (Last 24 hours) at 07/31/16 1255 Last data filed at 07/31/16 1239  Gross per 24 hour  Intake          2485.42 ml  Output             2000 ml  Net           485.42 ml    Exam Awake Alert, Oriented x 3, No new F.N deficits, Normal affect [place].AT,PERRAL Supple Neck,No JVD, No cervical lymphadenopathy appriciated.  Symmetrical Chest wall movement, Good air movement bilaterally, CTAB RRR,No Gallops,Rubs or new Murmurs, No Parasternal Heave +ve B.Sounds, Abd Soft, Minimal tenderness in suprapubic area, No rebound -guarding or rigidity. No Cyanosis, Clubbing or edema, No new Rash or bruise  Data Review   CBC w Diff: Lab Results  Component Value Date   WBC 10.0 07/31/2016   HGB 13.2 07/31/2016   HCT 38.9 07/31/2016   PLT 262 07/31/2016   LYMPHOPCT 9 (L) 10/04/2011   MONOPCT 4 10/04/2011   EOSPCT 0 10/04/2011   BASOPCT 0 10/04/2011    CMP: Lab Results  Component Value Date   NA 138 07/31/2016   K 4.4 07/31/2016   CL 108 07/31/2016   CO2 25 07/31/2016   BUN 11 07/31/2016   CREATININE 0.71 07/31/2016   PROT 7.2 07/31/2016   ALBUMIN 4.1 07/31/2016   BILITOT 0.3 07/31/2016   ALKPHOS 58 07/31/2016   AST 44 (H) 07/31/2016   ALT 63 (H) 07/31/2016  .   Total Time in preparing paper work, data evaluation and todays exam - 30 minutes  Krystal Humphrey M.D on 07/31/2016 at 12:55 PM  [facility]   Office  [phone]

## 2016-07-31 NOTE — Discharge Instructions (Signed)

## 2016-08-06 ENCOUNTER — Other Ambulatory Visit: Payer: Self-pay | Admitting: Urology

## 2016-08-12 ENCOUNTER — Encounter (HOSPITAL_BASED_OUTPATIENT_CLINIC_OR_DEPARTMENT_OTHER): Payer: Self-pay | Admitting: *Deleted

## 2016-08-12 NOTE — Progress Notes (Signed)
NPO AFTER MN.  ARRIVE AT 0715. CURRENT LAB RESULTS IN CHART AND EPIC.  WILL TAKE FLOMAX AM DOS W/ SIPS OF WATER.

## 2016-08-16 NOTE — H&P (Signed)
CC/HPI: 30yo female with history of bilateral intrarenal nonobstructing stones, L > R, with 2 presentations to ER in 1 week last week with diffuse abdominal pain, left back pain, diarrhea, N/V. CT imaging at first ER visit showed left 8mm intrarenal stone (1100HU) and nonobstructing 2mm right renal calculus. CT was also significant for multiple RLQ mesenteric lymph nodes in a pattern suggestive of mesenteric adenitis.   After inpatient admission to medicine with urology consultation and no improvement in left sided back pain symptoms with conservative management she underwent left ureteral 6Fr x 26cm JJ stent without dangler placement on 07/30/16 with Dr. Ronne BinningMcKenzie. She returns today with KUB and discussion of management of left sided renal stone - specifically URS vs ESWL.   She has noted LLQ pain off and on since stent placement and has had dysuria as well. She had emesis over the weekend but this resolved and her nausea has improved. + GH. No fevers, chills, major clot passage in urine. Left back pain is still present but improved. She just finished percocet, otherwise has been taking Flomax and aleve for discomfort. She's eating/drinking well.     CC: I have kidney stones.  HPI: Krystal Humphrey is a 30 year-old female patient who is here for renal calculi.      ALLERGIES: Flagyl - Vomiting Remeron - Trouble Breathing Tramadol - Itching    MEDICATIONS: Tamsulosin Hcl 0.4 mg capsule, ext release 24 hr  Aleve  Oxycodone Hcl     GU PSH: None   NON-GU PSH: Knee Arthroscopy/surgery, Right - 2010 Tonsillectomy    GU PMH: None     PMH Notes: stomach ulcer   NON-GU PMH: Anxiety disorder, unspecified Depression Heartburn    FAMILY HISTORY: nephrolithiasis - Mother   SOCIAL HISTORY: Marital Status: Single Current Smoking Status: Patient does not smoke anymore. Has not smoked since 07/30/2010. Smoked for 2 years. Smoked less than 1/2 pack per day.  Drinks 2 drinks per week.  Drinks 1  caffeinated drink per day. Patient's occupation is/was Unemployed.    REVIEW OF SYSTEMS:    GU Review Female:   Patient denies frequent urination, hard to postpone urination, burning /pain with urination, get up at night to urinate, leakage of urine, stream starts and stops, trouble starting your stream, have to strain to urinate, and currently pregnant.  Gastrointestinal (Upper):   Patient reports nausea and vomiting. Patient denies indigestion/ heartburn.  Gastrointestinal (Lower):   Patient denies diarrhea and constipation.  Constitutional:   Patient denies fever, night sweats, weight loss, and fatigue.  Skin:   Patient denies skin rash/ lesion and itching.  Eyes:   Patient denies blurred vision and double vision.  Ears/ Nose/ Throat:   Patient denies sore throat and sinus problems.  Hematologic/Lymphatic:   Patient denies swollen glands and easy bruising.  Cardiovascular:   Patient denies leg swelling and chest pains.  Respiratory:   Patient denies cough and shortness of breath.  Endocrine:   Patient denies excessive thirst.  Musculoskeletal:   Patient denies back pain and joint pain.  Neurological:   Patient denies headaches and dizziness.  Psychologic:   Patient denies depression and anxiety.   VITAL SIGNS:      08/04/2016 02:04 PM  Weight 190 lb / 86.18 kg  Height 68 in / 172.72 cm  BP 120/74 mmHg  Pulse 73 /min  Temperature 98.1 F / 37 C  BMI 28.9 kg/m   GU PHYSICAL EXAMINATION:      Notes: no CVAT bilaterally  MULTI-SYSTEM PHYSICAL EXAMINATION:    Constitutional: Well-nourished. No physical deformities. Normally developed. Good grooming.  Neck: Neck symmetrical, not swollen. Normal tracheal position.  Respiratory: No labored breathing, no use of accessory muscles.   Cardiovascular: Normal temperature, normal extremity pulses, no swelling, no varicosities.  Lymphatic: No enlargement of neck, axillae, groin.  Skin: No paleness, no jaundice, no cyanosis. No lesion, no  ulcer, no rash.  Neurologic / Psychiatric: Oriented to time, oriented to place, oriented to person. No depression, no anxiety, no agitation.  Gastrointestinal: No mass, no rigidity, obese abdomen with striae. Mild TTP in LLQ.  Eyes: Normal conjunctivae. Normal eyelids.  Ears, Nose, Mouth, and Throat: Left ear no scars, no lesions, no masses. Right ear no scars, no lesions, no masses. Nose no scars, no lesions, no masses. Normal hearing. Normal lips.  Musculoskeletal: Normal gait and station of head and neck.     PAST DATA REVIEWED:  Source Of History:  Patient   PROCEDURES:         KUB - 74000  A single view of the abdomen is obtained.  Calculi:  Left radioopacity noted in presumed location of known 8mm stone.  Ureteral Stent:  Left ureteral stent in appropriate position.               Urinalysis w/Scope - 81001 Dipstick Dipstick Cont'd Micro  Specimen: Voided Bilirubin: Neg WBC/hpf: 6-10/hpf  Color: Brown Ketones: Neg RBC/hpf: >60/hpf  Appearance: Cloudy Blood: 3+ Bacteria: Rare  Specific Gravity: 1.025 Protein: 3+ Cystals: Amorph Urates  pH: 6.0 Urobilinogen: 0.2 Casts: NS (Not Seen)  Glucose: Neg Nitrites: Neg Trichomonas: Not Present    Leukocyte Esterase: 2+ Mucous: Not Present      Epithelial Cells: 0-5/hpf      Yeast: NS (Not Seen)    ASSESSMENT:      ICD-10 Details  1 GU:   Kidney Stone - N20.0           Notes:   We discussed the relative risks, benefits, and success rates of ureteroscopy and ESWL. We did not discuss PCNL due to the size of her stone. KUB showed radioopacity that would make stone amenable to ESWL therapy. After thorough discussion, including risks of infection, bleeding, damage to surrounding structures, need for ureteral stent exchange (with ureteroscopy) and removal after procedure (either by string or local cystoscopy), patient wishes to proceed with left USE.    PLAN:           Orders Labs Urine Culture and Sensitivity           Schedule Procedure: Unspecified Date at [facility] or [facility] Available - Cysto Uretero Lithotripsy - [phone], left          Document Letter(s):  Created for Patient: Clinical Summary         Notes:   Schedule next available cystoscopy, left ureteral stent exchange, left RPG, left USE with laser lithotripsy with any available attending physician. Urine culture today. No other preop workup required.

## 2016-08-17 ENCOUNTER — Ambulatory Visit (HOSPITAL_BASED_OUTPATIENT_CLINIC_OR_DEPARTMENT_OTHER)
Admission: RE | Admit: 2016-08-17 | Discharge: 2016-08-17 | Disposition: A | Discharge status: Home / Self Care | Payer: Medicaid Other | Source: Ambulatory Visit | Attending: Urology | Admitting: Urology

## 2016-08-17 ENCOUNTER — Encounter (HOSPITAL_BASED_OUTPATIENT_CLINIC_OR_DEPARTMENT_OTHER): Payer: Self-pay | Admitting: *Deleted

## 2016-08-17 ENCOUNTER — Ambulatory Visit (HOSPITAL_BASED_OUTPATIENT_CLINIC_OR_DEPARTMENT_OTHER): Payer: Medicaid Other | Admitting: Anesthesiology

## 2016-08-17 ENCOUNTER — Encounter (HOSPITAL_BASED_OUTPATIENT_CLINIC_OR_DEPARTMENT_OTHER)
Admission: RE | Disposition: A | Discharge status: Home / Self Care | Payer: Self-pay | Source: Ambulatory Visit | Attending: Urology

## 2016-08-17 DIAGNOSIS — Z79899 Other long term (current) drug therapy: Secondary | ICD-10-CM | POA: Insufficient documentation

## 2016-08-17 DIAGNOSIS — K219 Gastro-esophageal reflux disease without esophagitis: Secondary | ICD-10-CM | POA: Insufficient documentation

## 2016-08-17 DIAGNOSIS — N200 Calculus of kidney: Secondary | ICD-10-CM | POA: Diagnosis present

## 2016-08-17 DIAGNOSIS — Z87891 Personal history of nicotine dependence: Secondary | ICD-10-CM | POA: Diagnosis not present

## 2016-08-17 HISTORY — DX: Calculus of kidney: N20.0

## 2016-08-17 HISTORY — DX: Diaphragmatic hernia without obstruction or gangrene: K44.9

## 2016-08-17 HISTORY — PX: CYSTOSCOPY WITH RETROGRADE PYELOGRAM, URETEROSCOPY AND STENT PLACEMENT: SHX5789

## 2016-08-17 HISTORY — DX: Urgency of urination: R39.15

## 2016-08-17 HISTORY — DX: Personal history of other mental and behavioral disorders: Z86.59

## 2016-08-17 HISTORY — DX: Personal history of other infectious and parasitic diseases: Z86.19

## 2016-08-17 HISTORY — DX: Personal history of other diseases of the digestive system: Z87.19

## 2016-08-17 SURGERY — CYSTOURETEROSCOPY, WITH RETROGRADE PYELOGRAM AND STENT INSERTION
Anesthesia: General | Site: Renal | Laterality: Left

## 2016-08-17 MED ORDER — MIDAZOLAM HCL 2 MG/2ML IJ SOLN
INTRAMUSCULAR | Status: AC
  Filled 2016-08-17: qty 2

## 2016-08-17 MED ORDER — FENTANYL CITRATE (PF) 100 MCG/2ML IJ SOLN
25.0000 ug | INTRAMUSCULAR | Status: DC | PRN
  Administered 2016-08-17: 50 ug via INTRAVENOUS
  Filled 2016-08-17: qty 1

## 2016-08-17 MED ORDER — ONDANSETRON HCL 4 MG/2ML IJ SOLN
INTRAMUSCULAR | Status: AC
  Filled 2016-08-17: qty 2

## 2016-08-17 MED ORDER — ONDANSETRON HCL 4 MG/2ML IJ SOLN
INTRAMUSCULAR | Status: DC | PRN
  Administered 2016-08-17: 4 mg via INTRAVENOUS

## 2016-08-17 MED ORDER — OXYCODONE HCL 5 MG PO TABS
ORAL_TABLET | ORAL | Status: AC
  Filled 2016-08-17: qty 1

## 2016-08-17 MED ORDER — SODIUM CHLORIDE 0.9 % IR SOLN
Status: DC | PRN
  Administered 2016-08-17: 1000 mL
  Administered 2016-08-17: 3000 mL

## 2016-08-17 MED ORDER — FENTANYL CITRATE (PF) 100 MCG/2ML IJ SOLN
INTRAMUSCULAR | Status: AC
  Filled 2016-08-17: qty 2

## 2016-08-17 MED ORDER — LACTATED RINGERS IV SOLN
INTRAVENOUS | Status: DC
  Administered 2016-08-17: 08:00:00 via INTRAVENOUS
  Filled 2016-08-17: qty 1000

## 2016-08-17 MED ORDER — KETOROLAC TROMETHAMINE 30 MG/ML IJ SOLN
INTRAMUSCULAR | Status: DC | PRN
  Administered 2016-08-17: 30 mg via INTRAVENOUS

## 2016-08-17 MED ORDER — FENTANYL CITRATE (PF) 100 MCG/2ML IJ SOLN
INTRAMUSCULAR | Status: AC
  Filled 2016-08-17: qty 4

## 2016-08-17 MED ORDER — PROPOFOL 10 MG/ML IV BOLUS
INTRAVENOUS | Status: DC | PRN
  Administered 2016-08-17: 160 mg via INTRAVENOUS
  Administered 2016-08-17: 30 mg via INTRAVENOUS
  Administered 2016-08-17: 40 mg via INTRAVENOUS

## 2016-08-17 MED ORDER — FENTANYL CITRATE (PF) 100 MCG/2ML IJ SOLN
INTRAMUSCULAR | Status: DC | PRN
  Administered 2016-08-17 (×4): 50 ug via INTRAVENOUS

## 2016-08-17 MED ORDER — OXYCODONE HCL 5 MG/5ML PO SOLN
5.0000 mg | Freq: Once | ORAL | Status: AC | PRN
  Filled 2016-08-17: qty 5

## 2016-08-17 MED ORDER — DEXAMETHASONE SODIUM PHOSPHATE 10 MG/ML IJ SOLN
INTRAMUSCULAR | Status: AC
  Filled 2016-08-17: qty 1

## 2016-08-17 MED ORDER — CIPROFLOXACIN IN D5W 400 MG/200ML IV SOLN
400.0000 mg | INTRAVENOUS | Status: AC
  Administered 2016-08-17: 400 mg via INTRAVENOUS
  Filled 2016-08-17: qty 200

## 2016-08-17 MED ORDER — DEXAMETHASONE SODIUM PHOSPHATE 4 MG/ML IJ SOLN
INTRAMUSCULAR | Status: DC | PRN
  Administered 2016-08-17: 10 mg via INTRAVENOUS

## 2016-08-17 MED ORDER — OXYCODONE HCL 5 MG PO TABS
5.0000 mg | ORAL_TABLET | Freq: Once | ORAL | Status: AC | PRN
  Administered 2016-08-17: 5 mg via ORAL
  Filled 2016-08-17: qty 1

## 2016-08-17 MED ORDER — ACETAMINOPHEN 325 MG PO TABS
325.0000 mg | ORAL_TABLET | ORAL | Status: DC | PRN
  Filled 2016-08-17: qty 2

## 2016-08-17 MED ORDER — CIPROFLOXACIN IN D5W 400 MG/200ML IV SOLN
INTRAVENOUS | Status: AC
  Filled 2016-08-17: qty 200

## 2016-08-17 MED ORDER — KETOROLAC TROMETHAMINE 30 MG/ML IJ SOLN
INTRAMUSCULAR | Status: AC
  Filled 2016-08-17: qty 1

## 2016-08-17 MED ORDER — STERILE WATER FOR IRRIGATION IR SOLN
Status: DC | PRN
  Administered 2016-08-17: 500 mL

## 2016-08-17 MED ORDER — LIDOCAINE 2% (20 MG/ML) 5 ML SYRINGE
INTRAMUSCULAR | Status: DC | PRN
  Administered 2016-08-17: 100 mg via INTRAVENOUS

## 2016-08-17 MED ORDER — ACETAMINOPHEN 160 MG/5ML PO SOLN
325.0000 mg | ORAL | Status: DC | PRN
  Filled 2016-08-17: qty 20.3

## 2016-08-17 MED ORDER — MIDAZOLAM HCL 5 MG/5ML IJ SOLN
INTRAMUSCULAR | Status: DC | PRN
  Administered 2016-08-17: 2 mg via INTRAVENOUS

## 2016-08-17 MED ORDER — LIDOCAINE HCL (CARDIAC) 20 MG/ML IV SOLN
INTRAVENOUS | Status: DC | PRN
  Administered 2016-08-17: 60 mg via INTRATRACHEAL

## 2016-08-17 SURGICAL SUPPLY — 17 items
BAG DRAIN URO-CYSTO SKYTR STRL (DRAIN) ×3 IMPLANT
BASKET STONE 1.7 NGAGE (UROLOGICAL SUPPLIES) ×3 IMPLANT
CATH URET 5FR 28IN OPEN ENDED (CATHETERS) ×3 IMPLANT
CLOTH BEACON ORANGE TIMEOUT ST (SAFETY) ×3 IMPLANT
FIBER LASER LITHO 273 (Laser) ×3 IMPLANT
GLOVE SURG SS PI 8.0 STRL IVOR (GLOVE) ×6 IMPLANT
GOWN STRL REUS W/ TWL LRG LVL3 (GOWN DISPOSABLE) ×1 IMPLANT
GOWN STRL REUS W/ TWL XL LVL3 (GOWN DISPOSABLE) ×2 IMPLANT
GOWN STRL REUS W/TWL LRG LVL3 (GOWN DISPOSABLE) ×2
GOWN STRL REUS W/TWL XL LVL3 (GOWN DISPOSABLE) ×4
GUIDEWIRE STR DUAL SENSOR (WIRE) ×3 IMPLANT
IV NS IRRIG 3000ML ARTHROMATIC (IV SOLUTION) ×3 IMPLANT
KIT ROOM TURNOVER WOR (KITS) ×3 IMPLANT
MANIFOLD NEPTUNE II (INSTRUMENTS) ×3 IMPLANT
PACK CYSTO (CUSTOM PROCEDURE TRAY) ×3 IMPLANT
SHEATH ACCESS URETERAL 38CM (SHEATH) ×3 IMPLANT
STENT URET 6FRX26 CONTOUR (STENTS) ×3 IMPLANT

## 2016-08-17 NOTE — Interval H&P Note (Signed)
History and Physical Interval Note:  08/17/2016 8:37 AM  Krystal Humphrey  has presented today for surgery, with the diagnosis of NEPHROLITHIASIS  The various methods of treatment have been discussed with the patient and family. After consideration of risks, benefits and other options for treatment, the patient has consented to  Procedure(s): CYSTOSCOPY WITH LEFT RETROGRADE  STENT EXCHANGE, URETEROSCOPY AND STONE EXTRACTION, LASER LITHOTRIPSY AND LEFT RETROGRADE PYELOGRAM (Left) as a surgical intervention .  The patient's history has been reviewed, patient examined, no change in status, stable for surgery.  I have reviewed the patient's chart and labs.  Questions were answered to the patient's satisfaction.     Deborahann Poteat J

## 2016-08-17 NOTE — Anesthesia Postprocedure Evaluation (Signed)
Anesthesia Post Note  Patient: Krystal Humphrey  Procedure(s) Performed: Procedure(s) (LRB): CYSTOSCOPY WITH LEFT RETROGRADE  STENT EXCHANGE, URETEROSCOPY AND STONE EXTRACTION, LASER LITHOTRIPSY AND LEFT RETROGRADE PYELOGRAM (Left)  Patient location during evaluation: PACU Anesthesia Type: General Level of consciousness: awake Pain management: pain level controlled Vital Signs Assessment: post-procedure vital signs reviewed and stable Respiratory status: spontaneous breathing Cardiovascular status: stable Postop Assessment: no signs of nausea or vomiting Anesthetic complications: no    Last Vitals:  Vitals:   08/17/16 1030 08/17/16 1134  BP: 127/85 (!) 112/58  Pulse: 62 (!) 58  Resp: 13 16  Temp:  36.7 C    Last Pain:  Vitals:   08/17/16 1134  TempSrc:   PainSc: 3                  Krystal Humphrey

## 2016-08-17 NOTE — Anesthesia Procedure Notes (Signed)
Procedure Name: LMA Insertion Date/Time: 08/17/2016 8:49 AM Performed by: Tyrone NineSAUVE, Surina Storts F Pre-anesthesia Checklist: Patient identified, Emergency Drugs available, Timeout performed, Suction available and Patient being monitored Patient Re-evaluated:Patient Re-evaluated prior to inductionOxygen Delivery Method: Circle system utilized Preoxygenation: Pre-oxygenation with 100% oxygen Intubation Type: IV induction Ventilation: Mask ventilation without difficulty LMA: LMA inserted LMA Size: 4.0 Number of attempts: 1 Placement Confirmation: positive ETCO2 and breath sounds checked- equal and bilateral Tube secured with: Tape Dental Injury: Teeth and Oropharynx as per pre-operative assessment

## 2016-08-17 NOTE — Op Note (Signed)
Preoperative Diagnosis: Left renal stone  Postoperative Diagnosis:  Same  Procedure(s) Performed: 1. Cystourethroscopy 2. Left ureteral stent removal 3. Left ureteroscopic stone extraction with laser lithotripsy and basket extraction 4. Left ureteral stent placement, 6Fr x 26cm JJ with dangler 5. Intraoperative fluoroscopy with interpretation <1h  Teaching Surgeon:  Bjorn Pippin, MD  Resident Surgeon:  Jalene Mullet, MD  Assistant(s):  None  Anesthesia:  General  Fluids:  See anesthesia record  Estimated blood loss:  <56mL  Specimens:  None  Cultures:  None  Drains:  LEFT 49fr x 26cm JJ ureteral stent with dangler  Complications:  None  Indications: 29yo female with history of recent hospitalization secondary to left back pain and large left renal stone s/p 66fr x 26cm JJ ureteral stent placement on 07/30/16 with Dr. Ronne Binning. She was seen in the clinic for preoperative appointment where the treatment options were discussed for stone treatment, patient wishes to proceed with left ureteroscopy. See previous urology notes for further information. Risks & benefits of procedure discussed with patient, who wishes to proceed.  Findings:  Left interpolar renal stone fragmented and dusted. Successful basket extraction of all significantly sized stones. Exchange of left ureteral stent with 6Fr x 26cm JJ with dangler.   Description:  The patient was correctly identified in the preop holding area where written informed consent as well potential risk and complication reviewed. She agreed. They were brought back to the operative suite where a preinduction timeout was performed. Once correct information was verified, general anesthesia was induced. They were then gently placed into dorsal lithotomy position with SCDs in place for VTE prophylaxis. They were prepped and draped in the usual sterile fashion and given appropriate preoperative antibiotics. A second timeout was then performed.   We  inserted a 23Fr rigid cystoscope per urethra with copious lubrication and normal saline irrigation running. No bladder tumors, stones were noted. A left ureteral stent was emanating from the left ureteral orifice with appropriate curl within the bladder. Using flexible graspers the stent was grasped and removed to the urethral meatus. A sensor wire was advanced into the left renal pelvis under fluoroscopic guidance through the existing left ureteral stent. The stent was then removed. A ureteral access sheath was then advanced over the sensor wire into the left proximal ureter under fluoroscopic guidance without difficulty. The sensor wire was removed. The flexible dual lumen ureteroscope was then advanced into the left ureter and renal pelvis. Proximal ureteroscopy revealed no stone or mucosal irregularity. Pyeloscopy showed a large interpolar calyceal stone as previously noted and described. A holmium laser fiber was advanced and the stone was treated with initial settings of 0.5J and 20Hz , and eventually increased to 1J with 50Hz . Any significantly sized stone fragments were removed with the NGage nitinol ureteral stone basket. The remainder of stone fragments were too fine to remove with the basket. We were confident that all significantly sized stones were removed. No other renal stones were noted and no other radioopacities were noted on fluoroscopy or spot KUB.  The flexible ureteroscope was removed. A sensor wire was easily advanced back into the left kidney with appropriate curl on fluoroscopic guidance through the ureteral access sheath. The ureteral access sheath was removed. The sensor wire was backloaded into the 23Fr rigid cystoscope, which was re-advanced into the bladder. A 94fr x 26cm JJ ureteral stent with dangler string was advanced under direct visual & fluoroscopic guidance with the assistance of a stent pusher. Sensor wire removal demonstrated satisfactory stent curl  proximally in renal pelvis  and distally in bladder. The bladder was emptied and all instrumentation was removed. The stent string was secured in place. Patient was woken up from anesthesia and taken to recovery unit for routine postoperative care.   Post Op Plan:   1. Discharge patient home when meets PACU criteria and voids x1. 2. Patient to pull ureteral stent at home in 5 days via string 3. Discharge with flomax, oxybutynin, pyridium & percocet 4. RTC in 6 weeks with a renal u/s for postop check 5. Patient to bring stone fragments to next clinic appt to send for stone analysis  Attestation:  Dr. Atlee Villers was preAnnabell Howellssent and scrubbed for the entirety of the procedure.    Jalene MulletMatthew Macey, MD Resident, Department of Urology

## 2016-08-17 NOTE — Discharge Instructions (Signed)
°Post Anesthesia Home Care Instructions ° °Activity: °Get plenty of rest for the remainder of the day. A responsible adult should stay with you for 24 hours following the procedure.  °For the next 24 hours, DO NOT: °-Drive a car °-Operate machinery °-Drink alcoholic beverages °-Take any medication unless instructed by your physician °-Make any legal decisions or sign important papers. ° °Meals: °Start with liquid foods such as gelatin or soup. Progress to regular foods as tolerated. Avoid greasy, spicy, heavy foods. If nausea and/or vomiting occur, drink only clear liquids until the nausea and/or vomiting subsides. Call your physician if vomiting continues. ° °Special Instructions/Symptoms: °Your throat may feel dry or sore from the anesthesia or the breathing tube placed in your throat during surgery. If this causes discomfort, gargle with warm salt water. The discomfort should disappear within 24 hours. ° °If you had a scopolamine patch placed behind your ear for the management of post- operative nausea and/or vomiting: ° °1. The medication in the patch is effective for 72 hours, after which it should be removed.  Wrap patch in a tissue and discard in the trash. Wash hands thoroughly with soap and water. °2. You may remove the patch earlier than 72 hours if you experience unpleasant side effects which may include dry mouth, dizziness or visual disturbances. °3. Avoid touching the patch. Wash your hands with soap and water after contact with the patch. °  °Ureteral Stent Implantation, Care After °Refer to this sheet in the next few weeks. These instructions provide you with information on caring for yourself after your procedure. Your health care provider may also give you more specific instructions. Your treatment has been planned according to current medical practices, but problems sometimes occur. Call your health care provider if you have any problems or questions after your procedure. °WHAT TO EXPECT AFTER  THE PROCEDURE °You should be back to normal activity within 48 hours after the procedure. Nausea and vomiting may occur and are commonly the result of anesthesia. °It is common to experience sharp pain in the back or lower abdomen and penis with voiding. This is caused by movement of the ends of the stent with the act of urinating. It usually goes away within minutes after you have stopped urinating. °HOME CARE INSTRUCTIONS °Make sure to drink plenty of fluids. You may have small amounts of bleeding, causing your urine to be red. This is normal. Certain movements may trigger pain or a feeling that you need to urinate. You may be given medicines to prevent infection or bladder spasms. Be sure to take all medicines as directed. Only take over-the-counter or prescription medicines for pain, discomfort, or fever as directed by your health care provider. Do not take aspirin, as this can make bleeding worse. °Your stent will be left in until the blockage is resolved. This may take 2 weeks or longer, depending on the reason for stent implantation. You may have an X-ray exam to make sure your ureter is open and that the stent has not moved out of position (migrated). The stent can be removed by your health care provider in the office. Medicines may be given for comfort while the stent is being removed. Be sure to keep all follow-up appointments so your health care provider can check that you are healing properly. °SEEK MEDICAL CARE IF: °· You experience increasing pain. °· Your pain medicine is not working. °SEEK IMMEDIATE MEDICAL CARE IF: °· Your urine is dark red or has blood clots. °· You are leaking   urine (incontinent). °· You have a fever, chills, feeling sick to your stomach (nausea), or vomiting. °· Your pain is not relieved by pain medicine. °· The end of the stent comes out of the urethra. °· You are unable to urinate. °  °This information is not intended to replace advice given to you by your health care provider.  Make sure you discuss any questions you have with your health care provider. °  °Document Released: 07/18/2013 Document Revised: 11/20/2013 Document Reviewed: 05/30/2015 °Elsevier Interactive Patient Education ©2016 [employer] ° °

## 2016-08-17 NOTE — Transfer of Care (Signed)
Immediate Anesthesia Transfer of Care Note  Patient: Krystal Humphrey  Procedure(s) Performed: Procedure(s): CYSTOSCOPY WITH LEFT RETROGRADE  STENT EXCHANGE, URETEROSCOPY AND STONE EXTRACTION, LASER LITHOTRIPSY AND LEFT RETROGRADE PYELOGRAM (Left)  Patient Location: PACU  Anesthesia Type:General  Level of Consciousness: awake, alert , oriented and patient cooperative  Airway & Oxygen Therapy: Patient Spontanous Breathing and Patient connected to nasal cannula oxygen  Post-op Assessment: Report given to RN and Post -op Vital signs reviewed and stable  Post vital signs: Reviewed and stable  Last Vitals:  Vitals:   08/17/16 0715  BP: 120/77  Pulse: 69  Resp: 16  Temp: 36.7 C    Last Pain:  Vitals:   08/17/16 0715  TempSrc: Oral      Patients Stated Pain Goal: 5 (08/17/16 0729)  Complications: No apparent anesthesia complications

## 2016-08-17 NOTE — Anesthesia Preprocedure Evaluation (Addendum)
Anesthesia Evaluation  Patient identified by MRN, date of birth, ID band Patient awake    Reviewed: Allergy & Precautions, NPO status , Patient's Chart, lab work & pertinent test results  History of Anesthesia Complications Negative for: history of anesthetic complications  Airway Mallampati: II  TM Distance: >3 FB Neck ROM: Full    Dental  (+) Teeth Intact, Dental Advisory Given,    Pulmonary neg shortness of breath, neg COPD, neg recent URI, former smoker,    breath sounds clear to auscultation       Cardiovascular Exercise Tolerance: Good negative cardio ROS   Rhythm:Regular     Neuro/Psych PSYCHIATRIC DISORDERS Anxiety Depression Panic Attacks Neuromuscular disease negative neurological ROS     GI/Hepatic Neg liver ROS, hiatal hernia, PUD, GERD  ,  Endo/Other    Renal/GU Renal disease     Musculoskeletal   Abdominal   Peds  Hematology  (+) anemia ,   Anesthesia Other Findings   Reproductive/Obstetrics                           Anesthesia Physical Anesthesia Plan  ASA: II  Anesthesia Plan: General   Post-op Pain Management:    Induction: Intravenous  Airway Management Planned: LMA  Additional Equipment: None  Intra-op Plan:   Post-operative Plan: Extubation in OR  Informed Consent: I have reviewed the patients History and Physical, chart, labs and discussed the procedure including the risks, benefits and alternatives for the proposed anesthesia with the patient or authorized representative who has indicated his/her understanding and acceptance.   Dental advisory given  Plan Discussed with: CRNA and Surgeon  Anesthesia Plan Comments:         Anesthesia Quick Evaluation

## 2016-08-18 ENCOUNTER — Encounter (HOSPITAL_BASED_OUTPATIENT_CLINIC_OR_DEPARTMENT_OTHER): Payer: Self-pay | Admitting: Urology

## 2016-10-11 ENCOUNTER — Emergency Department (HOSPITAL_COMMUNITY)
Admission: EM | Admit: 2016-10-11 | Discharge: 2016-10-11 | Disposition: A | Discharge status: Home / Self Care | Payer: Medicaid Other | Attending: Emergency Medicine | Admitting: Emergency Medicine

## 2016-10-11 ENCOUNTER — Encounter (HOSPITAL_COMMUNITY): Payer: Self-pay | Admitting: Emergency Medicine

## 2016-10-11 ENCOUNTER — Emergency Department (HOSPITAL_COMMUNITY): Payer: Medicaid Other

## 2016-10-11 DIAGNOSIS — M25561 Pain in right knee: Secondary | ICD-10-CM | POA: Diagnosis not present

## 2016-10-11 DIAGNOSIS — Y9364 Activity, baseball: Secondary | ICD-10-CM | POA: Insufficient documentation

## 2016-10-11 DIAGNOSIS — Y929 Unspecified place or not applicable: Secondary | ICD-10-CM | POA: Insufficient documentation

## 2016-10-11 DIAGNOSIS — Z87891 Personal history of nicotine dependence: Secondary | ICD-10-CM | POA: Insufficient documentation

## 2016-10-11 DIAGNOSIS — W500XXA Accidental hit or strike by another person, initial encounter: Secondary | ICD-10-CM | POA: Diagnosis not present

## 2016-10-11 DIAGNOSIS — Y999 Unspecified external cause status: Secondary | ICD-10-CM | POA: Insufficient documentation

## 2016-10-11 MED ORDER — HYDROCODONE-ACETAMINOPHEN 5-325 MG PO TABS: 1.0000 | ORAL_TABLET | Freq: Four times a day (QID) | ORAL | 0 refills | Status: DC | PRN

## 2016-10-11 NOTE — ED Provider Notes (Signed)
[facility]-EMERGENCY DEPT Provider Note   CSN: [id number] Arrival date & time: 10/11/16  1149  By signing my name below, I, Krystal Humphrey, attest that this documentation has been prepared under the direction and in the presence of Roxy Horseman, PA-C. Electronically Signed: Sonum Humphrey, Neurosurgeon. 10/11/16. 12:16 PM.  History   Chief Complaint Chief Complaint  Patient presents with  . Knee Pain    The history is provided by the patient. No language interpreter was used.     HPI Comments: Krystal Humphrey is a 30 y.o. female who presents to the Emergency Department complaining of 3 weeks of constant right knee pain that worsened yesterday after an injury. Patient states she initially rolled her right knee which has caused her some pain. She reports playing softball yesterday and when another player jabbed their elbow to the back of her knee, causing her pain to worsen and result in popping noises with movement of the right knee. She reports history of surgery to the right knee.   Past Medical History:  Diagnosis Date  . Anxiety   . Depression   . GERD (gastroesophageal reflux disease)   . Hiatal hernia   . History of Clostridium difficile colitis    04/ 2011  . History of duodenal ulcer    2009  . History of esophagitis   . History of panic attacks   . Nephrolithiasis    bilateral per ct 07-27-2016  L > R  (right side nonobstructive)  . Urgency of urination     Patient Active Problem List   Diagnosis Date Noted  . Nephrolithiasis 07/30/2016  . Flank pain   . Loose stools 02/06/2016  . Abdominal discomfort 01/19/2016  . Obesity 01/07/2015  . SVD (spontaneous vaginal delivery) 09/24/2011  . DEPRESSIVE TYPE PSYCHOSIS 08/21/2009  . ANEMIA, IRON DEFICIENCY 05/08/2009  . INSECT BITE 05/07/2009  . ULCER-DUODENAL 03/12/2009  . ABSENCE OF MENSTRUATION 10/02/2008  . EPIGASTRIC PAIN 10/01/2008  . REFLUX ESOPHAGITIS 10/10/2007    Past Surgical History:  Procedure Laterality Date  .  CYSTOSCOPY WITH RETROGRADE PYELOGRAM, URETEROSCOPY AND STENT PLACEMENT Left 07/30/2016   Procedure: CYSTOSCOPY WITH LEFT  RETROGRADE PYELOGRAM AND LEFT STENT PLACEMENT;  Surgeon: Malen Gauze, MD;  Location: [facility];  Service: Urology;  Laterality: Left;  . CYSTOSCOPY WITH RETROGRADE PYELOGRAM, URETEROSCOPY AND STENT PLACEMENT Left 08/17/2016   Procedure: CYSTOSCOPY WITH LEFT RETROGRADE  STENT EXCHANGE, URETEROSCOPY AND STONE EXTRACTION, LASER LITHOTRIPSY AND LEFT RETROGRADE PYELOGRAM;  Surgeon: Bjorn Pippin, MD;  Location: [facility];  Service: Urology;  Laterality: Left;  . ESOPHAGOGASTRODUODENOSCOPY  last one 03-26-2009  . KNEE ARTHROSCOPY Right 03/19/2010  . TONSILLECTOMY  child    OB History    Gravida Para Term Preterm AB Living   2 1 1   1 1    SAB TAB Ectopic Multiple Live Births   1       1       Home Medications    Prior to Admission medications   Medication Sig Start Date End Date Taking? Authorizing Provider  ibuprofen (ADVIL,MOTRIN) 200 MG tablet Take 800 mg by mouth every 6 (six) hours as needed for mild pain.    Historical Provider, MD  metoCLOPramide (REGLAN) 10 MG tablet Take 1 tablet (10 mg total) by mouth every 6 (six) hours. 07/27/16   Shawn C Joy, PA-C  Naproxen Sodium (ALEVE) 220 MG CAPS Take by mouth as needed.    Historical Provider, MD  oxybutynin (DITROPAN) 5 MG tablet Take  5 mg by mouth 3 (three) times daily.    Historical Provider, MD  tamsulosin (FLOMAX) 0.4 MG CAPS capsule Take 1 capsule (0.4 mg total) by mouth daily as needed (flank pain, stent discomfort). 07/31/16   Starleen Armsawood S Elgergawy, MD    Family History Family History  Problem Relation Age of Onset  . Diabetes Mother   . Thyroid disease Mother   . Heart attack Father   . Diabetes Maternal Aunt   . Diabetes Maternal Uncle   . COPD Maternal Grandmother   . Crohn's disease Maternal Grandmother   . Anesthesia problems Neg Hx   . Hypotension Neg Hx   . Malignant hyperthermia Neg Hx     . Pseudochol deficiency Neg Hx     Social History Social History  Substance Use Topics  . Smoking status: Former Smoker    Packs/day: 0.25    Years: 2.00    Quit date: 09/20/2008  . Smokeless tobacco: Never Used  . Alcohol use Yes     Comment: occasional     Allergies   Remeron [mirtazapine]; Flagyl [metronidazole]; and Tramadol   Review of Systems Review of Systems  Musculoskeletal: Positive for arthralgias.  Skin: Negative for wound.  Neurological: Negative for numbness.     Physical Exam Updated Vital Signs BP 127/57 (BP Location: Left Arm)   Pulse 67   Temp 98.1 F (36.7 C) (Oral)   Resp 18   Ht 5\' 8"  (1.727 m)   Wt 190 lb (86.2 kg)   SpO2 100%   BMI 28.89 kg/m   Physical Exam  Physical Exam  Constitutional: Pt appears well-developed and well-nourished. No distress.  HENT:  Head: Normocephalic and atraumatic.  Eyes: Conjunctivae are normal.  Neck: Normal range of motion.  Cardiovascular: Normal rate, regular rhythm and intact distal pulses.   Capillary refill < 3 sec  Pulmonary/Chest: Effort normal and breath sounds normal.  Musculoskeletal: Pt exhibits tenderness to palpation anteriorly. Pt exhibits no edema.  ROM: 4/5 on right, limited by pain  Neurological: Pt  is alert. Coordination normal.  Sensation 5/5 on right Strength 4/5 on right limited by pain  Skin: Skin is warm and dry. Pt is not diaphoretic.  No tenting of the skin  Psychiatric: Pt has a normal mood and affect.  Nursing note and vitals reviewed.   ED Treatments / Results  DIAGNOSTIC STUDIES: Oxygen Saturation is 100% on RA, normal by my interpretation.    COORDINATION OF CARE: 12:12 PM Discussed treatment plan with pt at bedside and pt agreed to plan.    Labs (all labs ordered are listed, but only abnormal results are displayed) Labs Reviewed - No data to display  EKG  EKG Interpretation None       Radiology Dg Knee Complete 4 Views Right  Result Date:  10/11/2016 CLINICAL DATA:  Pain after rolling type injury 2 weeks prior EXAM: RIGHT KNEE - COMPLETE 4+ VIEW COMPARISON:  February 09, 2010 FINDINGS: Frontal, lateral, and bilateral oblique views were obtained. There is no fracture or dislocation. Joint spaces appear normal. No erosive change. IMPRESSION: No fracture or joint effusion.  No appreciable arthropathy. Electronically Signed   By: Bretta BangWilliam  Woodruff III M.D.   On: 10/11/2016 12:48    Procedures Procedures (including critical care time)  Medications Ordered in ED Medications - No data to display   Initial Impression / Assessment and Plan / ED Course  I have reviewed the triage vital signs and the nursing notes.  Pertinent labs &  imaging results that were available during my care of the patient were reviewed by me and considered in my medical decision making (see chart for details).  Clinical Course     Patient X-Ray negative for obvious fracture or dislocation.  Pt advised to follow up with orthopedics. Patient given knee immobilizer and crutches while in ED, conservative therapy recommended and discussed. Patient will be discharged home & is agreeable with above plan. Returns precautions discussed. Pt appears safe for discharge.   Final Clinical Impressions(s) / ED Diagnoses   Final diagnoses:  Acute pain of right knee    New Prescriptions New Prescriptions   HYDROCODONE-ACETAMINOPHEN (NORCO/VICODIN) 5-325 MG TABLET    Take 1-2 tablets by mouth every 6 (six) hours as needed.   I personally performed the services described in this documentation, which was scribed in my presence. The recorded information has been reviewed and is accurate.      Roxy HorsemanRobert Jahmal Dunavant, PA-C 10/11/16 1330    Heide Scaleshristopher J Tegeler, MD 10/11/16 2122

## 2016-10-11 NOTE — ED Triage Notes (Addendum)
Pt from home with c/o right knee injury yesterday with "popping" and pain any time she moved the extremity.  Pt reports previous knee surgery and mild increase of pain x 3 weeks.  No swelling noted.  NAD, A&O.

## 2016-10-11 NOTE — Progress Notes (Signed)
Orthopedic Tech Progress Note Patient Details:  Krystal AugustBrandi Humphrey 12-26-1985 [id number]  Ortho Devices Type of Ortho Device: Crutches Ortho Device/Splint Interventions: Application   Saul FordyceJennifer C Latori Beggs 10/11/2016, 1:24 PM

## 2016-10-11 NOTE — ED Notes (Signed)
Ortho Tech called for crutches 

## 2016-10-12 ENCOUNTER — Ambulatory Visit (INDEPENDENT_AMBULATORY_CARE_PROVIDER_SITE_OTHER): Payer: Medicaid Other | Admitting: Orthopaedic Surgery

## 2016-10-12 ENCOUNTER — Encounter (INDEPENDENT_AMBULATORY_CARE_PROVIDER_SITE_OTHER): Payer: Self-pay | Admitting: Orthopaedic Surgery

## 2016-10-12 VITALS — BP 102/68 | HR 70 | Resp 14 | Ht 68.0 in | Wt 190.0 lb

## 2016-10-12 DIAGNOSIS — G8929 Other chronic pain: Secondary | ICD-10-CM

## 2016-10-12 DIAGNOSIS — M25561 Pain in right knee: Secondary | ICD-10-CM | POA: Diagnosis not present

## 2016-10-12 NOTE — Progress Notes (Signed)
Pt had right knee scope ion 2011. Pt was playing softball, twisted fell and twisted, popped. [facility] and placed in a brace.

## 2016-10-12 NOTE — Progress Notes (Signed)
Office Visit Note   Patient: Krystal Humphrey           Date of Birth: 04-06-1986           MRN: [id number] Visit Date: 10/12/2016              Requested by: Veryl SpeakGregory D Calone, FNP [address] [place], [place] [id number] PCP: Jeanine Luzalone, Gregory, FNP   Assessment & Plan: Visit Diagnoses:  1. Chronic pain of right knee     Plan: I think it would be worthwhile to obtain an MRI scan of her right knee. She's had 2 significant injuries over the past 3 weeks with significant pain. X-rays were negative for any fracture. Continues to experience some popping but is comfortable in the immobilizer with crutches. We will schedule the MRI scan and plan to keep her out of work at least through this week.  Follow-Up Instructions: Return for after MRI of right knee.   Orders:  Orders Placed This Encounter  Procedures  . MR Knee Right w/o contrast   No orders of the defined types were placed in this encounter.     Procedures: No procedures performed   Clinical Data: No additional findings.   Subjective: Chief Complaint  Patient presents with  . Right Knee - Pain    Krystal Humphrey initially injured her right knee approximately 3 weeks ago playing softball. She was running along the baseline beyond first base when she felt something "happen" in her right knee. Numerous for short time but she was able to continue playing. Yesterday she was playing softball running to second base when the second baseman  landed on her right knee. She felt something "pop" and since that time has had difficulty bearing weight. She was seen in the emergency room with negative x-rays. She's presently in a knee immobilizer and crutches.    Review of Systems   Objective: Vital Signs: BP 102/68 (BP Location: Left Arm)   Pulse 70   Resp 14   Ht 5\' 8"  (1.727 m)   Wt 190 lb (86.2 kg)   BMI 28.89 kg/m   Physical Exam  Ortho Exam exam of the right knee demonstrates very minimal effusion. There was some discomfort about  the patella which was perfectly stable. There was no bruising. I could Not discern clinical instability. There was no popliteal pain or Discomfort. neurovascular exam was intact. Was no ecchymosis. The knee had full extension and flex to at least 90.  Specialty Comments:  No specialty comments available.  Imaging: No results found.   PMFS History: Patient Active Problem List   Diagnosis Date Noted  . Nephrolithiasis 07/30/2016  . Flank pain   . Loose stools 02/06/2016  . Abdominal discomfort 01/19/2016  . Obesity 01/07/2015  . SVD (spontaneous vaginal delivery) 09/24/2011  . DEPRESSIVE TYPE PSYCHOSIS 08/21/2009  . ANEMIA, IRON DEFICIENCY 05/08/2009  . INSECT BITE 05/07/2009  . ULCER-DUODENAL 03/12/2009  . ABSENCE OF MENSTRUATION 10/02/2008  . EPIGASTRIC PAIN 10/01/2008  . REFLUX ESOPHAGITIS 10/10/2007   Past Medical History:  Diagnosis Date  . Anxiety   . Depression   . GERD (gastroesophageal reflux disease)   . Hiatal hernia   . History of Clostridium difficile colitis    04/ 2011  . History of duodenal ulcer    2009  . History of esophagitis   . History of panic attacks   . Nephrolithiasis    bilateral per ct 07-27-2016  L > R  (right side nonobstructive)  .  Urgency of urination     Family History  Problem Relation Age of Onset  . Diabetes Mother   . Thyroid disease Mother   . Heart attack Father   . Diabetes Maternal Aunt   . Diabetes Maternal Uncle   . COPD Maternal Grandmother   . Crohn's disease Maternal Grandmother   . Anesthesia problems Neg Hx   . Hypotension Neg Hx   . Malignant hyperthermia Neg Hx   . Pseudochol deficiency Neg Hx     Past Surgical History:  Procedure Laterality Date  . CYSTOSCOPY WITH RETROGRADE PYELOGRAM, URETEROSCOPY AND STENT PLACEMENT Left 07/30/2016   Procedure: CYSTOSCOPY WITH LEFT  RETROGRADE PYELOGRAM AND LEFT STENT PLACEMENT;  Surgeon: Malen GauzePatrick L McKenzie, MD;  Location: [facility];  Service: Urology;  Laterality: Left;  .  CYSTOSCOPY WITH RETROGRADE PYELOGRAM, URETEROSCOPY AND STENT PLACEMENT Left 08/17/2016   Procedure: CYSTOSCOPY WITH LEFT RETROGRADE  STENT EXCHANGE, URETEROSCOPY AND STONE EXTRACTION, LASER LITHOTRIPSY AND LEFT RETROGRADE PYELOGRAM;  Surgeon: Bjorn PippinJohn Wrenn, MD;  [facility];  Service: Urology;  Laterality: Left;  . ESOPHAGOGASTRODUODENOSCOPY  last one 03-26-2009  . KNEE ARTHROSCOPY Right 03/19/2010  . TONSILLECTOMY  child   Social History   Occupational History  . unemployed    Social History Main Topics  . Smoking status: Former Smoker    Packs/day: 0.25    Years: 2.00    Quit date: 09/20/2008  . Smokeless tobacco: Never Used  . Alcohol use Yes     Comment: occasional  . Drug use: No  . Sexual activity: Not on file     Comment: implant placed 09/ 2014

## 2016-10-18 ENCOUNTER — Telehealth (INDEPENDENT_AMBULATORY_CARE_PROVIDER_SITE_OTHER): Payer: Self-pay | Admitting: Orthopaedic Surgery

## 2016-10-18 ENCOUNTER — Encounter (INDEPENDENT_AMBULATORY_CARE_PROVIDER_SITE_OTHER): Payer: Self-pay | Admitting: *Deleted

## 2016-10-18 NOTE — Telephone Encounter (Signed)
Patient calling on status of MRI being scheduled.

## 2016-10-18 NOTE — Telephone Encounter (Signed)
Patient was seen on 11/14 and taken out of work and was to be seen after her MRI; patient hasn't been scheduled for MRI yet. Patient is supposed to return to work tomorrow but is still having problems. Patient wants to know if she can get an extension on her work note.

## 2016-10-18 NOTE — Telephone Encounter (Signed)
Was pending insurance which is now approved, [facility] and she will be contacting pt to schedule appt.

## 2016-10-20 ENCOUNTER — Ambulatory Visit (INDEPENDENT_AMBULATORY_CARE_PROVIDER_SITE_OTHER): Payer: Self-pay | Admitting: Orthopedic Surgery

## 2016-10-25 ENCOUNTER — Ambulatory Visit
Admission: RE | Admit: 2016-10-25 | Discharge: 2016-10-25 | Disposition: A | Discharge status: Home / Self Care | Payer: Medicaid Other | Source: Ambulatory Visit | Attending: Orthopaedic Surgery | Admitting: Orthopaedic Surgery

## 2016-10-25 DIAGNOSIS — G8929 Other chronic pain: Secondary | ICD-10-CM

## 2016-10-25 DIAGNOSIS — M25561 Pain in right knee: Principal | ICD-10-CM

## 2016-10-29 ENCOUNTER — Telehealth (INDEPENDENT_AMBULATORY_CARE_PROVIDER_SITE_OTHER): Payer: Self-pay | Admitting: Orthopaedic Surgery

## 2016-10-29 ENCOUNTER — Telehealth: Payer: Self-pay | Admitting: Orthopaedic Surgery

## 2016-10-29 NOTE — Telephone Encounter (Signed)
Please advise 

## 2016-10-29 NOTE — Telephone Encounter (Signed)
Patient had MRI on 11/27; her follow up appointment isnt until 12/11. Patient has a doctor note to stay out of work until 11/28. Patient needs to know if she can return to work with restrictions or should she stay out longer.

## 2016-11-01 ENCOUNTER — Encounter (INDEPENDENT_AMBULATORY_CARE_PROVIDER_SITE_OTHER): Payer: Self-pay | Admitting: Orthopaedic Surgery

## 2016-11-01 ENCOUNTER — Ambulatory Visit (INDEPENDENT_AMBULATORY_CARE_PROVIDER_SITE_OTHER): Payer: Medicaid Other | Admitting: Orthopaedic Surgery

## 2016-11-01 VITALS — BP 102/68 | HR 68 | Resp 14 | Ht 68.0 in | Wt 190.0 lb

## 2016-11-01 DIAGNOSIS — M25561 Pain in right knee: Secondary | ICD-10-CM

## 2016-11-01 NOTE — Telephone Encounter (Signed)
Can u help

## 2016-11-01 NOTE — Progress Notes (Signed)
Office Visit Note   Patient: Krystal Humphrey           Date of Birth: Oct 13, 1986           MRN: [id number] Visit Date: 11/01/2016              Requested by: Veryl SpeakGregory D Calone, FNP [address] [place], [place] [id number] PCP: Jeanine Luzalone, Gregory, FNP   Assessment & Plan: Visit Diagnoses: No diagnosis found.  Plan: f/u in 1 month if left knee still is symptomatic  Follow-Up Instructions: No Follow-up on file. discontinue knee immobilizer. RTW Wed Dec 6. Activities as tolerated  Orders:  No orders of the defined types were placed in this encounter.  No orders of the defined types were placed in this encounter.     Procedures: No procedures performedMRI demonstrates small free edge tear lat meniscus involving ant horn and mid body. Ligaments intact   Clinical Data: No additional findings.   Subjective: No chief complaint on file.   Pt here for MRI results from Right knee injury  She has trouble sleeping with it  Injury to left knee playing softball about one month ago  Review of Systems   Objective: Vital Signs: Resp 14   Ht 5\' 8"  (1.727 m)   Wt 190 lb (86.2 kg)   BMI 28.89 kg/m   Physical Exam  Ortho Examno effusion. ROM 0-110 without instability. Neg ant drawer, no opening with varus and valgus testing. Mild tenderness along lat joint. Skin intact  Specialty Comments:  No specialty comments available.  Imaging: No results found.   PMFS History: Patient Active Problem List   Diagnosis Date Noted  . Nephrolithiasis 07/30/2016  . Flank pain   . Loose stools 02/06/2016  . Abdominal discomfort 01/19/2016  . Obesity 01/07/2015  . SVD (spontaneous vaginal delivery) 09/24/2011  . DEPRESSIVE TYPE PSYCHOSIS 08/21/2009  . ANEMIA, IRON DEFICIENCY 05/08/2009  . INSECT BITE 05/07/2009  . ULCER-DUODENAL 03/12/2009  . ABSENCE OF MENSTRUATION 10/02/2008  . EPIGASTRIC PAIN 10/01/2008  . REFLUX ESOPHAGITIS 10/10/2007   Past Medical History:  Diagnosis Date  .  Anxiety   . Depression   . GERD (gastroesophageal reflux disease)   . Hiatal hernia   . History of Clostridium difficile colitis    04/ 2011  . History of duodenal ulcer    2009  . History of esophagitis   . History of panic attacks   . Nephrolithiasis    bilateral per ct 07-27-2016  L > R  (right side nonobstructive)  . Urgency of urination     Family History  Problem Relation Age of Onset  . Diabetes Mother   . Thyroid disease Mother   . Heart attack Father   . Diabetes Maternal Aunt   . Diabetes Maternal Uncle   . COPD Maternal Grandmother   . Crohn's disease Maternal Grandmother   . Anesthesia problems Neg Hx   . Hypotension Neg Hx   . Malignant hyperthermia Neg Hx   . Pseudochol deficiency Neg Hx     Past Surgical History:  Procedure Laterality Date  . CYSTOSCOPY WITH RETROGRADE PYELOGRAM, URETEROSCOPY AND STENT PLACEMENT Left 07/30/2016   Procedure: CYSTOSCOPY WITH LEFT  RETROGRADE PYELOGRAM AND LEFT STENT PLACEMENT;  Surgeon: Malen GauzePatrick L McKenzie, MD;  Location: [facility];  Service: Urology;  Laterality: Left;  . CYSTOSCOPY WITH RETROGRADE PYELOGRAM, URETEROSCOPY AND STENT PLACEMENT Left 08/17/2016   Procedure: CYSTOSCOPY WITH LEFT RETROGRADE  STENT EXCHANGE, URETEROSCOPY AND STONE EXTRACTION, LASER LITHOTRIPSY AND  LEFT RETROGRADE PYELOGRAM;  Surgeon: Bjorn PippinJohn Wrenn, MD;  [facility] ;  Service: Urology;  Laterality: Left;  . ESOPHAGOGASTRODUODENOSCOPY  last one 03-26-2009  . KNEE ARTHROSCOPY Right 03/19/2010  . TONSILLECTOMY  child   Social History   Occupational History  . unemployed    Social History Main Topics  . Smoking status: Former Smoker    Packs/day: 0.25    Years: 2.00    Quit date: 09/20/2008  . Smokeless tobacco: Never Used  . Alcohol use Yes     Comment: occasional  . Drug use: No  . Sexual activity: Not on file     Comment: implant placed 09/ 2014

## 2016-11-01 NOTE — Telephone Encounter (Signed)
See if we can get her in to the office sooner

## 2016-11-02 NOTE — Telephone Encounter (Signed)
OPENED IN ERROR

## 2016-11-08 ENCOUNTER — Ambulatory Visit (INDEPENDENT_AMBULATORY_CARE_PROVIDER_SITE_OTHER): Payer: Medicaid Other | Admitting: Orthopaedic Surgery

## 2016-11-09 ENCOUNTER — Telehealth (INDEPENDENT_AMBULATORY_CARE_PROVIDER_SITE_OTHER): Payer: Self-pay | Admitting: Orthopaedic Surgery

## 2016-11-09 NOTE — Telephone Encounter (Signed)
OK 

## 2016-11-09 NOTE — Telephone Encounter (Signed)
Faxed 11/09/16

## 2016-11-09 NOTE — Telephone Encounter (Signed)
OK to send.

## 2016-11-09 NOTE — Telephone Encounter (Signed)
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 She says [employer] has faxed over a form to the office. If you have any questions, please call patient.

## 2016-12-10 ENCOUNTER — Ambulatory Visit (INDEPENDENT_AMBULATORY_CARE_PROVIDER_SITE_OTHER): Payer: Medicaid Other | Admitting: Orthopaedic Surgery

## 2016-12-20 ENCOUNTER — Emergency Department (HOSPITAL_BASED_OUTPATIENT_CLINIC_OR_DEPARTMENT_OTHER)
Admission: EM | Admit: 2016-12-20 | Discharge: 2016-12-20 | Disposition: A | Discharge status: Home / Self Care | Payer: Medicaid Other | Attending: Emergency Medicine | Admitting: Emergency Medicine

## 2016-12-20 ENCOUNTER — Encounter (HOSPITAL_BASED_OUTPATIENT_CLINIC_OR_DEPARTMENT_OTHER): Payer: Self-pay

## 2016-12-20 DIAGNOSIS — Z87891 Personal history of nicotine dependence: Secondary | ICD-10-CM | POA: Diagnosis not present

## 2016-12-20 DIAGNOSIS — R109 Unspecified abdominal pain: Secondary | ICD-10-CM | POA: Diagnosis not present

## 2016-12-20 DIAGNOSIS — J020 Streptococcal pharyngitis: Secondary | ICD-10-CM | POA: Diagnosis not present

## 2016-12-20 DIAGNOSIS — R079 Chest pain, unspecified: Secondary | ICD-10-CM | POA: Diagnosis not present

## 2016-12-20 DIAGNOSIS — J029 Acute pharyngitis, unspecified: Secondary | ICD-10-CM | POA: Diagnosis present

## 2016-12-20 LAB — RAPID STREP SCREEN (MED CTR MEBANE ONLY): STREPTOCOCCUS, GROUP A SCREEN (DIRECT): POSITIVE — AB

## 2016-12-20 MED ORDER — BENZONATATE 100 MG PO CAPS: 100.0000 mg | ORAL_CAPSULE | Freq: Three times a day (TID) | ORAL | 0 refills | Status: DC

## 2016-12-20 MED ORDER — AMOXICILLIN 500 MG PO CAPS: 500.0000 mg | ORAL_CAPSULE | Freq: Two times a day (BID) | ORAL | 0 refills | Status: DC

## 2016-12-20 MED ORDER — IBUPROFEN 600 MG PO TABS: 600.0000 mg | ORAL_TABLET | Freq: Four times a day (QID) | ORAL | 0 refills | Status: DC | PRN

## 2016-12-20 NOTE — ED Triage Notes (Signed)
C/o sore throat, HA x 2 days-NAD-steady gait

## 2016-12-20 NOTE — Discharge Instructions (Signed)
Medications: amoxicillin, Tessalon, ibuprofen  Treatment: Take amoxicillin twice daily for 10 days. Make sure to finish all of this medication. Take Tessalon every 8 hours as needed for cough. Take ibuprofen every 6 hours as needed for pain. You can also alternate with Tylenol as prescribed over-the-counter.  Follow-up: Please return to the emergency department if you develop any new or worsening symptoms including chest pain at rest, shortness of breath, drooling, inability to open your mouth wide, or any other new or concerning symptoms. Please follow-up with your primary care provider if your symptoms are not improving over the next 3-5 days.

## 2016-12-20 NOTE — ED Notes (Signed)
Pt directed to pharmacy to pick up Rx. Note for work given 

## 2016-12-20 NOTE — ED Provider Notes (Signed)
[facility] Note   CSN: [id number] Arrival date & time: 12/20/16  1247  By signing my name below, I, Linna Darner, attest that this documentation has been prepared under the direction and in the presence of [employer], PA-C. Electronically Signed: Linna Darner, Scribe. 12/20/2016. 4:05 PM.  History   Chief Complaint Chief Complaint  Patient presents with  . Sore Throat    The history is provided by the patient. No language interpreter was used.     HPI Comments: Krystal Humphrey is a 31 y.o. female who presents to the Emergency Department complaining of a constant sore throat since yesterday. She reports associated headache, subjective fever/chills, a non-productive cough, and intermittent nausea. Pt reports chest pain and abdominal pain secondary to her cough. She notes she had one episode of vomiting yesterday morning but none since then. She endorses sore throat exacerbation with swallowing. No medications or treatments tried PTA. No known sick contacts with similar symptoms. No recent immobilizations, surgeries, or known cancer. No h/o immunocompromised conditions or blood clots. She is on birth control via Implanon. She denies ear pain, drooling, nasal congestion, rhinorrhea, SOB, trismus, trouble swallowing, pain/swelling in her lower extremities, or any other associated symptoms.  Past Medical History:  Diagnosis Date  . Anxiety   . Depression   . GERD (gastroesophageal reflux disease)   . Hiatal hernia   . History of Clostridium difficile colitis    04/ 2011  . History of duodenal ulcer    2009  . History of esophagitis   . History of panic attacks   . Nephrolithiasis    bilateral per ct 07-27-2016  L > R  (right side nonobstructive)  . Urgency of urination     Patient Active Problem List   Diagnosis Date Noted  . Acute pain of right knee 11/01/2016  . Nephrolithiasis 07/30/2016  . Flank pain   . Loose stools 02/06/2016  . Abdominal  discomfort 01/19/2016  . Obesity 01/07/2015  . SVD (spontaneous vaginal delivery) 09/24/2011  . DEPRESSIVE TYPE PSYCHOSIS 08/21/2009  . ANEMIA, IRON DEFICIENCY 05/08/2009  . INSECT BITE 05/07/2009  . ULCER-DUODENAL 03/12/2009  . ABSENCE OF MENSTRUATION 10/02/2008  . EPIGASTRIC PAIN 10/01/2008  . REFLUX ESOPHAGITIS 10/10/2007    Past Surgical History:  Procedure Laterality Date  . CYSTOSCOPY WITH RETROGRADE PYELOGRAM, URETEROSCOPY AND STENT PLACEMENT Left 07/30/2016   Procedure: CYSTOSCOPY WITH LEFT  RETROGRADE PYELOGRAM AND LEFT STENT PLACEMENT;  Surgeon: Malen Gauze, MD;  Location: [facility];  Service: Urology;  Laterality: Left;  . CYSTOSCOPY WITH RETROGRADE PYELOGRAM, URETEROSCOPY AND STENT PLACEMENT Left 08/17/2016   Procedure: CYSTOSCOPY WITH LEFT RETROGRADE  STENT EXCHANGE, URETEROSCOPY AND STONE EXTRACTION, LASER LITHOTRIPSY AND LEFT RETROGRADE PYELOGRAM;  Surgeon: Bjorn Pippin, MD;  Location[facility];  Service: Urology;  Laterality: Left;  . ESOPHAGOGASTRODUODENOSCOPY  last one 03-26-2009  . KNEE ARTHROSCOPY Right 03/19/2010  . TONSILLECTOMY  child    OB History    Gravida Para Term Preterm AB Living   2 1 1   1 1    SAB TAB Ectopic Multiple Live Births   1       1       Home Medications    Prior to Admission medications   Medication Sig Start Date End Date Taking? Authorizing Provider  amoxicillin (AMOXIL) 500 MG capsule Take 1 capsule (500 mg total) by mouth 2 (two) times daily. 12/20/16   Emi Holes, PA-C  benzonatate (TESSALON) 100 MG capsule Take  1 capsule (100 mg total) by mouth every 8 (eight) hours. 12/20/16   Emi Holes, PA-C  ibuprofen (ADVIL,MOTRIN) 600 MG tablet Take 1 tablet (600 mg total) by mouth every 6 (six) hours as needed. 12/20/16   Emi Holes, PA-C    Family History Family History  Problem Relation Age of Onset  . Diabetes Mother   . Thyroid disease Mother   . Heart attack Father   . Diabetes Maternal Aunt   .  Diabetes Maternal Uncle   . COPD Maternal Grandmother   . Crohn's disease Maternal Grandmother   . Anesthesia problems Neg Hx   . Hypotension Neg Hx   . Malignant hyperthermia Neg Hx   . Pseudochol deficiency Neg Hx     Social History Social History  Substance Use Topics  . Smoking status: Former Smoker    Packs/day: 0.25    Years: 2.00    Quit date: 09/20/2008  . Smokeless tobacco: Never Used  . Alcohol use Yes     Comment: occasional     Allergies   Remeron [mirtazapine]; Flagyl [metronidazole]; and Tramadol   Review of Systems Review of Systems  Constitutional: Positive for chills and fever.  HENT: Positive for sore throat. Negative for congestion, drooling, ear pain, facial swelling, rhinorrhea and trouble swallowing.   Respiratory: Positive for cough. Negative for shortness of breath.   Cardiovascular: Positive for chest pain.  Gastrointestinal: Positive for abdominal pain, nausea and vomiting (one episode).  Neurological: Positive for headaches.  Psychiatric/Behavioral: The patient is not nervous/anxious.      Physical Exam Updated Vital Signs BP 115/59 (BP Location: Right Arm)   Pulse 74   Temp 98.7 F (37.1 C) (Oral)   Resp 16   Ht 5\' 8"  (1.727 m)   Wt 88.5 kg   SpO2 100%   BMI 29.65 kg/m   Physical Exam  Constitutional: She appears well-developed and well-nourished. No distress.  HENT:  Head: Normocephalic and atraumatic.  Right Ear: Tympanic membrane normal.  Left Ear: Tympanic membrane normal.  Mouth/Throat: Mucous membranes are normal. No trismus in the jaw. No uvula swelling. Posterior oropharyngeal erythema present. No oropharyngeal exudate, posterior oropharyngeal edema or tonsillar abscesses. Tonsils are 0 on the right. Tonsils are 0 on the left. No tonsillar exudate.  S/p tonsillectomy  Eyes: Conjunctivae are normal. Pupils are equal, round, and reactive to light. Right eye exhibits no discharge. Left eye exhibits no discharge. No scleral  icterus.  Neck: Normal range of motion. Neck supple. No thyromegaly present.  Cardiovascular: Normal rate, regular rhythm, normal heart sounds and intact distal pulses.  Exam reveals no gallop and no friction rub.   No murmur heard. Pulmonary/Chest: Effort normal and breath sounds normal. No stridor. No respiratory distress. She has no wheezes. She has no rales.  Abdominal: Soft. Bowel sounds are normal. She exhibits no distension. There is no tenderness. There is no rebound and no guarding.  Musculoskeletal: She exhibits no edema.  Lymphadenopathy:    She has no cervical adenopathy.  Neurological: She is alert. Coordination normal.  Skin: Skin is warm and dry. No rash noted. She is not diaphoretic. No pallor.  Psychiatric: She has a normal mood and affect.  Nursing note and vitals reviewed.    ED Treatments / Results  Labs (all labs ordered are listed, but only abnormal results are displayed) Labs Reviewed  RAPID STREP SCREEN (NOT AT [facility]) - Abnormal; Notable for the following:       Result Value  Streptococcus, Group A Screen (Direct) POSITIVE (*)    All other components within normal limits    EKG  EKG Interpretation None       Radiology No results found.  Procedures Procedures (including critical care time)  DIAGNOSTIC STUDIES: Oxygen Saturation is 100% on RA, normal by my interpretation.    COORDINATION OF CARE: 4:13 PM Discussed treatment plan with pt at bedside and pt agreed to plan.  Medications Ordered in ED Medications - No data to display   Initial Impression / Assessment and Plan / ED Course  I have reviewed the triage vital signs and the nursing notes.  Pertinent labs & imaging results that were available during my care of the patient were reviewed by me and considered in my medical decision making (see chart for details).     Pt rapid strep test positive. Pt is tolerating secretions. Presentation not concerning for peritonsillar abscess or spread  of infection to deep spaces of the throat; patent airway. Possible underlying URI as well. Doubt PE or ACS as cause of chest pain. Pt will be discharged with amoxicillin, Tessalon, ibuprofen.  Specific return precautions discussed. Recommended PCP follow up. Patient understands and agrees with plan. Patient vitals stable throughout ED course and discharge in satisfactory condition.    Final Clinical Impressions(s) / ED Diagnoses   Final diagnoses:  Strep pharyngitis    New Prescriptions New Prescriptions   AMOXICILLIN (AMOXIL) 500 MG CAPSULE    Take 1 capsule (500 mg total) by mouth 2 (two) times daily.   BENZONATATE (TESSALON) 100 MG CAPSULE    Take 1 capsule (100 mg total) by mouth every 8 (eight) hours.   IBUPROFEN (ADVIL,MOTRIN) 600 MG TABLET    Take 1 tablet (600 mg total) by mouth every 6 (six) hours as needed.   I personally performed the services described in this documentation, which was scribed in my presence. The recorded information has been reviewed and is accurate.    Emi Holeslexandra M Tadeo Besecker, PA-C 12/20/16 1629    Gwyneth SproutWhitney Plunkett, MD 12/23/16 2158

## 2017-01-07 ENCOUNTER — Ambulatory Visit: Payer: Medicaid Other | Admitting: Family

## 2017-01-09 ENCOUNTER — Emergency Department (HOSPITAL_COMMUNITY)
Admission: EM | Admit: 2017-01-09 | Discharge: 2017-01-09 | Disposition: A | Discharge status: Home / Self Care | Payer: Medicaid Other | Attending: Emergency Medicine | Admitting: Emergency Medicine

## 2017-01-09 ENCOUNTER — Encounter (HOSPITAL_COMMUNITY): Payer: Self-pay | Admitting: *Deleted

## 2017-01-09 DIAGNOSIS — R51 Headache: Secondary | ICD-10-CM | POA: Insufficient documentation

## 2017-01-09 DIAGNOSIS — R519 Headache, unspecified: Secondary | ICD-10-CM

## 2017-01-09 DIAGNOSIS — Z87891 Personal history of nicotine dependence: Secondary | ICD-10-CM | POA: Insufficient documentation

## 2017-01-09 DIAGNOSIS — Z7982 Long term (current) use of aspirin: Secondary | ICD-10-CM | POA: Insufficient documentation

## 2017-01-09 LAB — BASIC METABOLIC PANEL
ANION GAP: 8 (ref 5–15)
BUN: 17 mg/dL (ref 6–20)
CALCIUM: 9.5 mg/dL (ref 8.9–10.3)
CHLORIDE: 108 mmol/L (ref 101–111)
CO2: 25 mmol/L (ref 22–32)
CREATININE: 0.69 mg/dL (ref 0.44–1.00)
GFR calc non Af Amer: >60 mL/min (ref >60)
Glucose, Bld: 88 mg/dL (ref 65–99)
Potassium: 3.5 mmol/L (ref 3.5–5.1)
SODIUM: 141 mmol/L (ref 135–145)

## 2017-01-09 LAB — CBC WITH DIFFERENTIAL/PLATELET
BASOS PCT: 1 %
Basophils Absolute: 0.1 10*3/uL (ref 0.0–0.1)
EOS ABS: 0.3 10*3/uL (ref 0.0–0.7)
Eosinophils Relative: 3 %
HEMATOCRIT: 38.5 % (ref 36.0–46.0)
HEMOGLOBIN: 13.3 g/dL (ref 12.0–15.0)
Lymphocytes Relative: 36 %
Lymphs Abs: 3.8 10*3/uL (ref 0.7–4.0)
MCH: 30.8 pg (ref 26.0–34.0)
MCHC: 34.5 g/dL (ref 30.0–36.0)
MCV: 89.1 fL (ref 78.0–100.0)
Monocytes Absolute: 0.9 10*3/uL (ref 0.1–1.0)
Monocytes Relative: 9 %
NEUTROS ABS: 5.5 10*3/uL (ref 1.7–7.7)
NEUTROS PCT: 51 %
Platelets: 295 10*3/uL (ref 150–400)
RBC: 4.32 MIL/uL (ref 3.87–5.11)
RDW: 13 % (ref 11.5–15.5)
WBC: 10.5 10*3/uL (ref 4.0–10.5)

## 2017-01-09 LAB — URINALYSIS, ROUTINE W REFLEX MICROSCOPIC
Bilirubin Urine: NEGATIVE
Glucose, UA: NEGATIVE mg/dL
Ketones, ur: NEGATIVE mg/dL
LEUKOCYTES UA: NEGATIVE
NITRITE: NEGATIVE
PH: 6.5 (ref 5.0–8.0)
Protein, ur: NEGATIVE mg/dL
SPECIFIC GRAVITY, URINE: 1.02 (ref 1.005–1.030)

## 2017-01-09 LAB — URINALYSIS, MICROSCOPIC (REFLEX): BACTERIA UA: NONE SEEN

## 2017-01-09 LAB — POC URINE PREG, ED: PREG TEST UR: NEGATIVE

## 2017-01-09 MED ORDER — ONDANSETRON HCL 4 MG/2ML IJ SOLN
4.0000 mg | Freq: Once | INTRAMUSCULAR | Status: AC
  Administered 2017-01-09: 4 mg via INTRAVENOUS
  Filled 2017-01-09: qty 2

## 2017-01-09 MED ORDER — ONDANSETRON 4 MG PO TBDP: 4.0000 mg | ORAL_TABLET | Freq: Three times a day (TID) | ORAL | 0 refills | Status: DC | PRN

## 2017-01-09 MED ORDER — BUTALBITAL-APAP-CAFFEINE 50-325-40 MG PO TABS
2.0000 | ORAL_TABLET | Freq: Once | ORAL | Status: AC
  Administered 2017-01-09: 2 via ORAL
  Filled 2017-01-09: qty 2

## 2017-01-09 MED ORDER — BUTALBITAL-APAP-CAFFEINE 50-325-40 MG PO TABS: 1.0000 | ORAL_TABLET | Freq: Four times a day (QID) | ORAL | 0 refills | Status: DC | PRN

## 2017-01-09 MED ORDER — SODIUM CHLORIDE 0.9 % IV BOLUS (SEPSIS)
1000.0000 mL | Freq: Once | INTRAVENOUS | Status: AC
  Administered 2017-01-09: 1000 mL via INTRAVENOUS

## 2017-01-09 MED ORDER — KETOROLAC TROMETHAMINE 30 MG/ML IJ SOLN
30.0000 mg | Freq: Once | INTRAMUSCULAR | Status: AC
  Administered 2017-01-09: 30 mg via INTRAVENOUS
  Filled 2017-01-09: qty 1

## 2017-01-09 MED ORDER — IBUPROFEN 800 MG PO TABS: 800.0000 mg | ORAL_TABLET | Freq: Three times a day (TID) | ORAL | 0 refills | Status: DC

## 2017-01-09 NOTE — ED Provider Notes (Signed)
[facility]-EMERGENCY DEPT Provider Note   CSN: [id number] Arrival date & time: 01/09/17  1859     History   Chief Complaint No chief complaint on file.   HPI Krystal Humphrey is a 31 y.o. female.  HPI    Krystal Humphrey is a 31 y.o. female, with a history of Anxiety, presenting to the ED with A headache and nausea for the past 3-4 days. Pain is throbbing, moderate, bilateral frontal, radiating posteriorly. Patient endorses recent URI symptoms with nonproductive cough, congestion, subjective fever, and chills for the past couple weeks. She also endorses a poor oral intake, drinking 16 ounces or less water daily. Patient also works 2 jobs working in a hot environment and usually drinks energy drinks daily. She has not had any energy drinks or other sources of caffeine for the past 3 days. She denies neuro deficits, LOC, shortness of breath, chest pain, or any other complaints.      Past Medical History:  Diagnosis Date  . Anxiety   . Depression   . GERD (gastroesophageal reflux disease)   . Hiatal hernia   . History of Clostridium difficile colitis    04/ 2011  . History of duodenal ulcer    2009  . History of esophagitis   . History of panic attacks   . Nephrolithiasis    bilateral per ct 07-27-2016  L > R  (right side nonobstructive)  . Urgency of urination     Patient Active Problem List   Diagnosis Date Noted  . Acute pain of right knee 11/01/2016  . Nephrolithiasis 07/30/2016  . Flank pain   . Loose stools 02/06/2016  . Abdominal discomfort 01/19/2016  . Obesity 01/07/2015  . SVD (spontaneous vaginal delivery) 09/24/2011  . DEPRESSIVE TYPE PSYCHOSIS 08/21/2009  . ANEMIA, IRON DEFICIENCY 05/08/2009  . INSECT BITE 05/07/2009  . ULCER-DUODENAL 03/12/2009  . ABSENCE OF MENSTRUATION 10/02/2008  . EPIGASTRIC PAIN 10/01/2008  . REFLUX ESOPHAGITIS 10/10/2007    Past Surgical History:  Procedure Laterality Date  . CYSTOSCOPY WITH RETROGRADE PYELOGRAM, URETEROSCOPY AND STENT  PLACEMENT Left 07/30/2016   Procedure: CYSTOSCOPY WITH LEFT  RETROGRADE PYELOGRAM AND LEFT STENT PLACEMENT;  Surgeon: Malen Gauze, MD;  Location: [facility];  Service: Urology;  Laterality: Left;  . CYSTOSCOPY WITH RETROGRADE PYELOGRAM, URETEROSCOPY AND STENT PLACEMENT Left 08/17/2016   Procedure: CYSTOSCOPY WITH LEFT RETROGRADE  STENT EXCHANGE, URETEROSCOPY AND STONE EXTRACTION, LASER LITHOTRIPSY AND LEFT RETROGRADE PYELOGRAM;  Surgeon: Bjorn Pippin, MD;  Location: [facility];  Service: Urology;  Laterality: Left;  . ESOPHAGOGASTRODUODENOSCOPY  last one 03-26-2009  . KNEE ARTHROSCOPY Right 03/19/2010  . TONSILLECTOMY  child    OB History    Gravida Para Term Preterm AB Living   2 1 1   1 1    SAB TAB Ectopic Multiple Live Births   1       1       Home Medications    Prior to Admission medications   Medication Sig Start Date End Date Taking? Authorizing Provider  acetaminophen (TYLENOL) 500 MG tablet Take 1,000 mg by mouth every 6 (six) hours as needed.   Yes Historical Provider, MD  aspirin-acetaminophen-caffeine (EXCEDRIN MIGRAINE) [phone] MG tablet Take 2 tablets by mouth every 6 (six) hours as needed for headache.   Yes Historical Provider, MD  Etonogestrel (IMPLANON [place]) Inject into the skin.   Yes Historical Provider, MD  ibuprofen (ADVIL,MOTRIN) 600 MG tablet Take 1 tablet (600 mg total) by mouth every 6 (six)  hours as needed. Patient taking differently: Take 600-800 mg by mouth every 6 (six) hours as needed.  12/20/16  Yes Alexandra M Law, PA-C  naproxen sodium (ANAPROX) 220 MG tablet Take 440 mg by mouth 2 (two) times daily with a meal.   Yes Historical Provider, MD  amoxicillin (AMOXIL) 500 MG capsule Take 1 capsule (500 mg total) by mouth 2 (two) times daily. Patient not taking: Reported on 01/09/2017 12/20/16   Waylan BogaAlexandra M Law, PA-C  benzonatate (TESSALON) 100 MG capsule Take 1 capsule (100 mg total) by mouth every 8 (eight) hours. Patient not taking: Reported  on 01/09/2017 12/20/16   Emi HolesAlexandra M Law, PA-C  butalbital-acetaminophen-caffeine (FIORICET, ESGIC) [phone] MG tablet Take 1-2 tablets by mouth every 6 (six) hours as needed for headache. 01/09/17 01/09/18  Shawn C Joy, PA-C  ibuprofen (ADVIL,MOTRIN) 800 MG tablet Take 1 tablet (800 mg total) by mouth 3 (three) times daily. 01/09/17   Shawn C Joy, PA-C  ondansetron (ZOFRAN ODT) 4 MG disintegrating tablet Take 1 tablet (4 mg total) by mouth every 8 (eight) hours as needed for nausea or vomiting. 01/09/17   Anselm PancoastShawn C Joy, PA-C    Family History Family History  Problem Relation Age of Onset  . Diabetes Mother   . Thyroid disease Mother   . Heart attack Father   . Diabetes Maternal Aunt   . Diabetes Maternal Uncle   . COPD Maternal Grandmother   . Crohn's disease Maternal Grandmother   . Anesthesia problems Neg Hx   . Hypotension Neg Hx   . Malignant hyperthermia Neg Hx   . Pseudochol deficiency Neg Hx     Social History Social History  Substance Use Topics  . Smoking status: Former Smoker    Packs/day: 0.25    Years: 2.00    Quit date: 09/20/2008  . Smokeless tobacco: Never Used  . Alcohol use Yes     Comment: occasional     Allergies   Remeron [mirtazapine]; Flagyl [metronidazole]; and Tramadol   Review of Systems Review of Systems  Constitutional: Positive for chills and fever (subjective, old).  Eyes: Negative for visual disturbance.  Respiratory: Positive for cough (old). Negative for shortness of breath.   Cardiovascular: Negative for chest pain.  Gastrointestinal: Positive for nausea. Negative for abdominal pain, diarrhea and vomiting.  Musculoskeletal: Negative for back pain and neck pain.  Neurological: Positive for headaches. Negative for dizziness, syncope, weakness, light-headedness and numbness.  All other systems reviewed and are negative.    Physical Exam Updated Vital Signs BP 108/95 (BP Location: Left Arm)   Pulse 81   Temp 98.9 F (37.2 C) (Oral)    Resp 18   Wt 90.3 kg   SpO2 100%   BMI 30.26 kg/m   Physical Exam  Constitutional: She appears well-developed and well-nourished. No distress.  HENT:  Head: Normocephalic and atraumatic.  Eyes: Conjunctivae and EOM are normal. Pupils are equal, round, and reactive to light.  Neck: Normal range of motion. Neck supple.  Cardiovascular: Normal rate, regular rhythm, normal heart sounds and intact distal pulses.   Pulmonary/Chest: Effort normal and breath sounds normal. No respiratory distress.  Abdominal: Soft. There is no tenderness. There is no guarding.  Musculoskeletal: She exhibits no edema.  Normal motor function intact in all extremities and spine. No midline spinal tenderness.   Lymphadenopathy:    She has no cervical adenopathy.  Neurological: She is alert.  No sensory deficits. Strength 5/5 in all extremities. No gait disturbance. Coordination intact  including heel to shin and finger to nose. Cranial nerves III-XII grossly intact. No facial droop.   Skin: Skin is warm and dry. She is not diaphoretic.  Psychiatric: She has a normal mood and affect. Her behavior is normal.  Nursing note and vitals reviewed.    ED Treatments / Results  Labs (all labs ordered are listed, but only abnormal results are displayed) Labs Reviewed  URINALYSIS, ROUTINE W REFLEX MICROSCOPIC - Abnormal; Notable for the following:       Result Value   Hgb urine dipstick SMALL (*)    All other components within normal limits  URINALYSIS, MICROSCOPIC (REFLEX) - Abnormal; Notable for the following:    Squamous Epithelial / LPF 0-5 (*)    All other components within normal limits  BASIC METABOLIC PANEL  CBC WITH DIFFERENTIAL/PLATELET  POC URINE PREG, ED    EKG  EKG Interpretation None       Radiology No results found.  Procedures Procedures (including critical care time)  Medications Ordered in ED Medications  butalbital-acetaminophen-caffeine (FIORICET, ESGIC) 50-325-40 MG per tablet 2  tablet (not administered)  sodium chloride 0.9 % bolus 1,000 mL (0 mLs Intravenous Stopped 01/09/17 2203)  ondansetron (ZOFRAN) injection 4 mg (4 mg Intravenous Given 01/09/17 2008)  ketorolac (TORADOL) 30 MG/ML injection 30 mg (30 mg Intravenous Given 01/09/17 2056)  ondansetron (ZOFRAN) injection 4 mg (4 mg Intravenous Given 01/09/17 2203)  sodium chloride 0.9 % bolus 1,000 mL (1,000 mLs Intravenous New Bag/Given 01/09/17 2204)     Initial Impression / Assessment and Plan / ED Course  I have reviewed the triage vital signs and the nursing notes.  Pertinent labs & imaging results that were available during my care of the patient were reviewed by me and considered in my medical decision making (see chart for details).     Patient presents with headache, nausea, as well as URI type symptoms. Patient admits to poor oral intake as well as use of energy drinks daily followed by abrupt discontinuation of all caffeine. Patient was counseled on proper hydration. Patient's headache began to improve with IV fluids. Patient is nontoxic appearing, afebrile, not tachycardic, not tachypneic, not hypotensive, maintains SPO2 of 100% on room air, and is in no apparent distress. She has no neuro or functional deficits. Able to pass an oral fluid challenge. End of shift patient care handoff report given to Antony Madura, PA-C. Plan: Finish IV fluids. Discharge.    Vitals:   01/09/17 2100 01/09/17 2110 01/09/17 2130 01/09/17 2200  BP: 129/84 118/61 132/61 129/75  Pulse: 64 60 62 60  Resp:  16    Temp:      TempSrc:      SpO2: 100% 100% 100% 100%  Weight:         Final Clinical Impressions(s) / ED Diagnoses   Final diagnoses:  Acute nonintractable headache, unspecified headache type    New Prescriptions New Prescriptions   BUTALBITAL-ACETAMINOPHEN-CAFFEINE (FIORICET, ESGIC) 50-325-40 MG TABLET    Take 1-2 tablets by mouth every 6 (six) hours as needed for headache.   IBUPROFEN (ADVIL,MOTRIN) 800 MG  TABLET    Take 1 tablet (800 mg total) by mouth 3 (three) times daily.   ONDANSETRON (ZOFRAN ODT) 4 MG DISINTEGRATING TABLET    Take 1 tablet (4 mg total) by mouth every 8 (eight) hours as needed for nausea or vomiting.     Anselm Pancoast, PA-C 01/09/17 2225    Raeford Razor, MD 01/17/17 [phone]

## 2017-01-09 NOTE — ED Triage Notes (Signed)
Pt c/o headache, nausea, cough, body aches, tactile temp and sweats for several days.

## 2017-01-09 NOTE — Discharge Instructions (Signed)
Dehydration and sudden caffeine withdrawal is suspected to be contributing to your symptoms. Drink plenty of fluids and get plenty of rest. You should be drinking at least half a liter of water an hour to stay hydrated. Electrolyte drinks are also encouraged. You should be drinking enough fluids to make your urine light yellow, almost clear. If this is not the case, you are not drinking enough water. Ibuprofen, Naproxen, or Tylenol for pain. May alternatively try the Fioricet for headache. Zofran for nausea. Follow up with a primary care provider, as needed, for any future management of this issue.

## 2017-01-18 ENCOUNTER — Emergency Department (HOSPITAL_COMMUNITY): Payer: Medicaid Other

## 2017-01-18 ENCOUNTER — Encounter (HOSPITAL_COMMUNITY): Payer: Self-pay | Admitting: Emergency Medicine

## 2017-01-18 ENCOUNTER — Emergency Department (HOSPITAL_COMMUNITY)
Admission: EM | Admit: 2017-01-18 | Discharge: 2017-01-18 | Disposition: A | Discharge status: Home / Self Care | Payer: Medicaid Other | Attending: Emergency Medicine | Admitting: Emergency Medicine

## 2017-01-18 DIAGNOSIS — Z87891 Personal history of nicotine dependence: Secondary | ICD-10-CM | POA: Diagnosis not present

## 2017-01-18 DIAGNOSIS — Z7982 Long term (current) use of aspirin: Secondary | ICD-10-CM | POA: Insufficient documentation

## 2017-01-18 DIAGNOSIS — J111 Influenza due to unidentified influenza virus with other respiratory manifestations: Secondary | ICD-10-CM

## 2017-01-18 DIAGNOSIS — R05 Cough: Secondary | ICD-10-CM | POA: Diagnosis present

## 2017-01-18 DIAGNOSIS — R69 Illness, unspecified: Secondary | ICD-10-CM

## 2017-01-18 MED ORDER — ACETAMINOPHEN-CODEINE 120-12 MG/5ML PO SOLN: 10.0000 mL | ORAL | 0 refills | Status: DC | PRN

## 2017-01-18 MED ORDER — KETOROLAC TROMETHAMINE 30 MG/ML IJ SOLN
30.0000 mg | Freq: Once | INTRAMUSCULAR | Status: AC
  Administered 2017-01-18: 30 mg via INTRAVENOUS
  Filled 2017-01-18: qty 1

## 2017-01-18 MED ORDER — IBUPROFEN 800 MG PO TABS: 800.0000 mg | ORAL_TABLET | Freq: Three times a day (TID) | ORAL | 0 refills | Status: DC | PRN

## 2017-01-18 MED ORDER — SODIUM CHLORIDE 0.9 % IV BOLUS (SEPSIS)
1000.0000 mL | Freq: Once | INTRAVENOUS | Status: AC
  Administered 2017-01-18: 1000 mL via INTRAVENOUS

## 2017-01-18 MED ORDER — HYDROCOD POLST-CPM POLST ER 10-8 MG/5ML PO SUER
5.0000 mL | Freq: Once | ORAL | Status: AC
  Administered 2017-01-18: 5 mL via ORAL
  Filled 2017-01-18: qty 5

## 2017-01-18 MED ORDER — GUAIFENESIN ER 1200 MG PO TB12: 1.0000 | ORAL_TABLET | Freq: Two times a day (BID) | ORAL | 0 refills | Status: DC

## 2017-01-18 NOTE — Discharge Instructions (Signed)
Return here as needed.  Follow-up with your primary care Dr. increase her fluid intake and rest as much as possible °

## 2017-01-18 NOTE — ED Notes (Signed)
Patient transported to X-ray 

## 2017-01-18 NOTE — ED Notes (Signed)
Patient verbalized understanding of discharge instructions and denies any further needs or questions at this time. VS stable. Patient ambulatory with steady gait.  

## 2017-01-18 NOTE — ED Notes (Signed)
Pt called out in pain, rates pain an 8

## 2017-01-18 NOTE — ED Triage Notes (Addendum)
[facility] [employer] with c/o generalized chest pressure across her chest with sharp central pain radiating to the middle of her back with cough and movement.  Pt additionally reports non-productive cough.  Denies N/V/D.  SOB present due to pain.  12 lead EKG unremarkable.  NAD, A&O.

## 2017-01-19 NOTE — ED Provider Notes (Signed)
[facility]-EMERGENCY DEPT Provider Note   CSN: [id number] Arrival date & time: 01/18/17  1112     History   Chief Complaint Chief Complaint  Patient presents with  . Chest Pain  . Cough    HPI Krystal Humphrey is a 31 y.o. female.  HPI Patient presents to the emergency department with coughing but causes pain in her chest and back, sharp in nature.  The patient states that she also had nasal congestion and chills.  The patient stated this started this morning.  Patient did not take any medications prior to arrival today The patient denies  shortness of breath, headache,blurred vision, neck pain, fever, cough, weakness, numbness, dizziness, anorexia, edema, abdominal pain, nausea, vomiting, diarrhea, rash, back pain, dysuria, hematemesis, bloody stool, near syncope, or syncope.  The patient states that movement also makes the pain worse at times Past Medical History:  Diagnosis Date  . Anxiety   . Depression   . GERD (gastroesophageal reflux disease)   . Hiatal hernia   . History of Clostridium difficile colitis    04/ 2011  . History of duodenal ulcer    2009  . History of esophagitis   . History of panic attacks   . Nephrolithiasis    bilateral per ct 07-27-2016  L > R  (right side nonobstructive)  . Urgency of urination     Patient Active Problem List   Diagnosis Date Noted  . Acute pain of right knee 11/01/2016  . Nephrolithiasis 07/30/2016  . Flank pain   . Loose stools 02/06/2016  . Abdominal discomfort 01/19/2016  . Obesity 01/07/2015  . SVD (spontaneous vaginal delivery) 09/24/2011  . DEPRESSIVE TYPE PSYCHOSIS 08/21/2009  . ANEMIA, IRON DEFICIENCY 05/08/2009  . INSECT BITE 05/07/2009  . ULCER-DUODENAL 03/12/2009  . ABSENCE OF MENSTRUATION 10/02/2008  . EPIGASTRIC PAIN 10/01/2008  . REFLUX ESOPHAGITIS 10/10/2007    Past Surgical History:  Procedure Laterality Date  . CYSTOSCOPY WITH RETROGRADE PYELOGRAM, URETEROSCOPY AND STENT PLACEMENT Left 07/30/2016   Procedure: CYSTOSCOPY WITH LEFT  RETROGRADE PYELOGRAM AND LEFT STENT PLACEMENT;  Surgeon: Malen Gauze, MD;  Location: [facility];  Service: Urology;  Laterality: Left;  . CYSTOSCOPY WITH RETROGRADE PYELOGRAM, URETEROSCOPY AND STENT PLACEMENT Left 08/17/2016   Procedure: CYSTOSCOPY WITH LEFT RETROGRADE  STENT EXCHANGE, URETEROSCOPY AND STONE EXTRACTION, LASER LITHOTRIPSY AND LEFT RETROGRADE PYELOGRAM;  Surgeon: Bjorn Pippin, MD;  Location: [facility];  Service: Urology;  Laterality: Left;  . ESOPHAGOGASTRODUODENOSCOPY  last one 03-26-2009  . KNEE ARTHROSCOPY Right 03/19/2010  . TONSILLECTOMY  child    OB History    Gravida Para Term Preterm AB Living   2 1 1   1 1    SAB TAB Ectopic Multiple Live Births   1       1       Home Medications    Prior to Admission medications   Medication Sig Start Date End Date Taking? Authorizing Provider  acetaminophen (TYLENOL) 500 MG tablet Take 1,000 mg by mouth every 6 (six) hours as needed.    Historical Provider, MD  acetaminophen-codeine 120-12 MG/5ML solution Take 10 mLs by mouth every 4 (four) hours as needed for moderate pain. 01/18/17   Charlestine Night, PA-C  amoxicillin (AMOXIL) 500 MG capsule Take 1 capsule (500 mg total) by mouth 2 (two) times daily. Patient not taking: Reported on 01/09/2017 12/20/16   Emi Holes, PA-C  aspirin-acetaminophen-caffeine (EXCEDRIN MIGRAINE) [phone] MG tablet Take 2 tablets by mouth every 6 (six) hours as  needed for headache.    Historical Provider, MD  benzonatate (TESSALON) 100 MG capsule Take 1 capsule (100 mg total) by mouth every 8 (eight) hours. Patient not taking: Reported on 01/09/2017 12/20/16   Emi HolesAlexandra M Law, PA-C  butalbital-acetaminophen-caffeine (FIORICET, ESGIC) [phone] MG tablet Take 1-2 tablets by mouth every 6 (six) hours as needed for headache. 01/09/17 01/09/18  Shawn C Joy, PA-C  Etonogestrel (IMPLANON [place]) Inject into the skin.    Historical Provider, MD  Guaifenesin  1200 MG TB12 Take 1 tablet (1,200 mg total) by mouth 2 (two) times daily. 01/18/17   Charlestine Nighthristopher Yasseen Salls, PA-C  ibuprofen (ADVIL,MOTRIN) 800 MG tablet Take 1 tablet (800 mg total) by mouth every 8 (eight) hours as needed. 01/18/17   Charlestine Nighthristopher Sutter Ahlgren, PA-C  naproxen sodium (ANAPROX) 220 MG tablet Take 440 mg by mouth 2 (two) times daily with a meal.    Historical Provider, MD  ondansetron (ZOFRAN ODT) 4 MG disintegrating tablet Take 1 tablet (4 mg total) by mouth every 8 (eight) hours as needed for nausea or vomiting. 01/09/17   Anselm PancoastShawn C Joy, PA-C    Family History Family History  Problem Relation Age of Onset  . Diabetes Mother   . Thyroid disease Mother   . Heart attack Father   . Diabetes Maternal Aunt   . Diabetes Maternal Uncle   . COPD Maternal Grandmother   . Crohn's disease Maternal Grandmother   . Anesthesia problems Neg Hx   . Hypotension Neg Hx   . Malignant hyperthermia Neg Hx   . Pseudochol deficiency Neg Hx     Social History Social History  Substance Use Topics  . Smoking status: Former Smoker    Packs/day: 0.25    Years: 2.00    Quit date: 09/20/2008  . Smokeless tobacco: Never Used  . Alcohol use Yes     Comment: occasional     Allergies   Remeron [mirtazapine]; Flagyl [metronidazole]; and Tramadol   Review of Systems Review of Systems All other systems negative except as documented in the HPI. All pertinent positives and negatives as reviewed in the HPI.  Physical Exam Updated Vital Signs BP 100/62   Pulse 68   Temp 98.1 F (36.7 C) (Oral)   Resp 12   SpO2 100%   Physical Exam  Constitutional: She is oriented to person, place, and time. She appears well-developed and well-nourished. No distress.  HENT:  Head: Normocephalic and atraumatic.  Mouth/Throat: Oropharynx is clear and moist.  Eyes: Pupils are equal, round, and reactive to light.  Neck: Normal range of motion. Neck supple.  Cardiovascular: Normal rate, regular rhythm and normal heart  sounds.  Exam reveals no gallop and no friction rub.   No murmur heard. Pulmonary/Chest: Effort normal and breath sounds normal. No respiratory distress. She has no wheezes.  Abdominal: Soft. Bowel sounds are normal. She exhibits no distension. There is no tenderness.  Neurological: She is alert and oriented to person, place, and time. She exhibits normal muscle tone. Coordination normal.  Skin: Skin is warm and dry. No rash noted. No erythema.  Psychiatric: She has a normal mood and affect. Her behavior is normal.  Nursing note and vitals reviewed.    ED Treatments / Results  Labs (all labs ordered are listed, but only abnormal results are displayed) Labs Reviewed - No data to display  EKG  EKG Interpretation  Date/Time:  Tuesday January 18 2017 11:16:55 EST Ventricular Rate:  56 PR Interval:    QRS Duration: 94  QT Interval:  422 QTC Calculation: 408 R Axis:   67 Text Interpretation:  Sinus rhythm Confirmed by Juleen China  MD, STEPHEN [phone]) on 01/18/2017 2:14:45 PM       Radiology Dg Chest 2 View  Result Date: 01/18/2017 CLINICAL DATA:  Chest pressure radiating into the back.  Cough. EXAM: CHEST  2 VIEW COMPARISON:  PA and lateral chest 11/17/2015. FINDINGS: The lungs are clear. Heart size is normal. No pneumothorax or pleural fluid. No bony abnormality. IMPRESSION: Negative chest. Electronically Signed   By: Drusilla Kanner M.D.   On: 01/18/2017 12:40    Procedures Procedures (including critical care time)  Medications Ordered in ED Medications  sodium chloride 0.9 % bolus 1,000 mL (0 mLs Intravenous Stopped 01/18/17 1401)  ketorolac (TORADOL) 30 MG/ML injection 30 mg (30 mg Intravenous Given 01/18/17 1222)  chlorpheniramine-HYDROcodone (TUSSIONEX) 10-8 MG/5ML suspension 5 mL (5 mLs Oral Given 01/18/17 1420)     Initial Impression / Assessment and Plan / ED Course  I have reviewed the triage vital signs and the nursing notes.  Pertinent labs & imaging results that were  available during my care of the patient were reviewed by me and considered in my medical decision making (see chart for details).     She will be treated for influenza-like illness.  Told to return here as needed.  Patient is given treatment for her symptoms.  Patient is to rest and increase her fluid intake.  She agrees the plan and all questions were answered  Final Clinical Impressions(s) / ED Diagnoses   Final diagnoses:  Influenza-like illness    New Prescriptions Discharge Medication List as of 01/18/2017  3:16 PM    START taking these medications   Details  acetaminophen-codeine 120-12 MG/5ML solution Take 10 mLs by mouth every 4 (four) hours as needed for moderate pain., Starting Tue 01/18/2017, Print    Guaifenesin 1200 MG TB12 Take 1 tablet (1,200 mg total) by mouth 2 (two) times daily., Starting Tue 01/18/2017, Print         Charlestine Night, PA-C 01/19/17 1635    Raeford Razor, MD 01/31/17 [phone]

## 2017-02-15 ENCOUNTER — Other Ambulatory Visit (INDEPENDENT_AMBULATORY_CARE_PROVIDER_SITE_OTHER): Payer: Self-pay

## 2017-02-15 ENCOUNTER — Ambulatory Visit (INDEPENDENT_AMBULATORY_CARE_PROVIDER_SITE_OTHER): Payer: Medicaid Other | Admitting: Orthopaedic Surgery

## 2017-02-15 ENCOUNTER — Encounter (INDEPENDENT_AMBULATORY_CARE_PROVIDER_SITE_OTHER): Payer: Self-pay | Admitting: Orthopaedic Surgery

## 2017-02-15 VITALS — BP 126/87 | HR 68 | Ht 68.0 in | Wt 190.0 lb

## 2017-02-15 DIAGNOSIS — M2241 Chondromalacia patellae, right knee: Secondary | ICD-10-CM

## 2017-02-15 DIAGNOSIS — M25561 Pain in right knee: Secondary | ICD-10-CM

## 2017-02-15 DIAGNOSIS — G8929 Other chronic pain: Secondary | ICD-10-CM

## 2017-02-15 NOTE — Progress Notes (Signed)
Office Visit Note   Patient: Krystal Humphrey           Date of Birth: May 21, 1986           MRN: [id number] Visit Date: 02/15/2017              Requested by: Veryl SpeakGregory D Calone, FNP [id number] [place], [place] [id number] PCP: Jeanine Luzalone, Gregory, FNP   Assessment & Plan: Visit Diagnoses:  1. Chronic pain of right knee   MRI scan performed in November demonstrated a small free edge tear involving the anterior horn and mid body of the lateral meniscus medial meniscus was intact femoral cartilage was normal there was mild degenerative chondrosis of the medial and lateral compartments. Symptoms are consistent with chondromalacia patella  Plan: Long discussion regarding the results of MRI scan from November as above. I've given her several choices including course of physical therapy, cortisone injection, or even an arthroscopic debridement. I don't think based on her history or exam that the anterior cruciate ligament is insufficient. I am also not sure that the tear of the lateral meniscus is symptomatic. However arthroscopic evaluation might determine some other pathology. Thus the reason for that suggestion. We will suggest physical therapy and plan to see her back over the next 6-8 weeks she can decide her next step.. She does have a brace at home.  Follow-Up Instructions: No Follow-up on file.   Orders:  No orders of the defined types were placed in this encounter.  No orders of the defined types were placed in this encounter.     Procedures: No procedures performed   Clinical Data: No additional findings.   Subjective: No chief complaint on file.   Patient is a 31 year old female that presents with chronic right knee pain that "locks" (felt and heard) when she squats to pick up something at work.When this happens, she falls over to get it "straight".  Upon flexion she can reproduce the "popping" sound. She has some edema in patella region and has trouble sleeping.  Pt relates she  had cortisone injections in the past but provided no relief. History of right knee surgery, pt states they "shaved her kneecap."  Krystal Humphrey has had recurrent episodes of right knee pain.  Her discomfort started months ago while playing softball. She did have an MRI scan as outlined above that demonstrated a small free edge tear of the lateral meniscus. This does not appear to be where she is symptomatic. She has  had a sensation of her knee catching underneath the patella. She does appear to have a problem when she does deep knee bends or squats.  Review of Systems   Objective: Vital Signs: There were no vitals taken for this visit.  Physical Exam  Ortho Exam right knee exam without evidence of effusion. No instability with a varus or valgus stress. Negative Lockman's test,. Negative anterior drawer sign. No medial or lateral joint pain. Mild patella crepitation but no discomfort with patellar compression. Full extension and flexion over 120. No popliteal pain or mass. No calf pain no swelling distally.  Specialty Comments:  No specialty comments available.  Imaging: No results found.   PMFS History: Patient Active Problem List   Diagnosis Date Noted  . Acute pain of right knee 11/01/2016  . Nephrolithiasis 07/30/2016  . Flank pain   . Loose stools 02/06/2016  . Abdominal discomfort 01/19/2016  . Obesity 01/07/2015  . SVD (spontaneous vaginal delivery) 09/24/2011  . DEPRESSIVE TYPE PSYCHOSIS  08/21/2009  . ANEMIA, IRON DEFICIENCY 05/08/2009  . INSECT BITE 05/07/2009  . ULCER-DUODENAL 03/12/2009  . ABSENCE OF MENSTRUATION 10/02/2008  . EPIGASTRIC PAIN 10/01/2008  . REFLUX ESOPHAGITIS 10/10/2007   Past Medical History:  Diagnosis Date  . Anxiety   . Depression   . GERD (gastroesophageal reflux disease)   . Hiatal hernia   . History of Clostridium difficile colitis    04/ 2011  . History of duodenal ulcer    2009  . History of esophagitis   . History of panic attacks     . Nephrolithiasis    bilateral per ct 07-27-2016  L > R  (right side nonobstructive)  . Urgency of urination     Family History  Problem Relation Age of Onset  . Diabetes Mother   . Thyroid disease Mother   . Heart attack Father   . Diabetes Maternal Aunt   . Diabetes Maternal Uncle   . COPD Maternal Grandmother   . Crohn's disease Maternal Grandmother   . Anesthesia problems Neg Hx   . Hypotension Neg Hx   . Malignant hyperthermia Neg Hx   . Pseudochol deficiency Neg Hx     Past Surgical History:  Procedure Laterality Date  . CYSTOSCOPY WITH RETROGRADE PYELOGRAM, URETEROSCOPY AND STENT PLACEMENT Left 07/30/2016   Procedure: CYSTOSCOPY WITH LEFT  RETROGRADE PYELOGRAM AND LEFT STENT PLACEMENT;  Surgeon: Malen Gauze, MD;  Location: [facility];  Service: Urology;  Laterality: Left;  . CYSTOSCOPY WITH RETROGRADE PYELOGRAM, URETEROSCOPY AND STENT PLACEMENT Left 08/17/2016   Procedure: CYSTOSCOPY WITH LEFT RETROGRADE  STENT EXCHANGE, URETEROSCOPY AND STONE EXTRACTION, LASER LITHOTRIPSY AND LEFT RETROGRADE PYELOGRAM;  Surgeon: Bjorn Pippin, MD;  Location: [facility];  Service: Urology;  Laterality: Left;  . ESOPHAGOGASTRODUODENOSCOPY  last one 03-26-2009  . KNEE ARTHROSCOPY Right 03/19/2010  . TONSILLECTOMY  child   Social History   Occupational History  . unemployed    Social History Main Topics  . Smoking status: Former Smoker    Packs/day: 0.25    Years: 2.00    Quit date: 09/20/2008  . Smokeless tobacco: Never Used  . Alcohol use Yes     Comment: occasional  . Drug use: No  . Sexual activity: Not on file     Comment: implant placed 09/ 2014

## 2017-02-21 ENCOUNTER — Ambulatory Visit: Payer: Medicaid Other | Attending: Physical Therapy | Admitting: Physical Therapy

## 2017-03-14 ENCOUNTER — Encounter: Payer: Self-pay | Admitting: Family

## 2017-03-14 ENCOUNTER — Ambulatory Visit (INDEPENDENT_AMBULATORY_CARE_PROVIDER_SITE_OTHER): Payer: PRIVATE HEALTH INSURANCE | Admitting: Family

## 2017-03-14 VITALS — BP 118/80 | HR 62 | Temp 98.3°F | Resp 16 | Ht 68.0 in | Wt 201.0 lb

## 2017-03-14 DIAGNOSIS — E6609 Other obesity due to excess calories: Secondary | ICD-10-CM

## 2017-03-14 DIAGNOSIS — Z6830 Body mass index (BMI) 30.0-30.9, adult: Secondary | ICD-10-CM | POA: Diagnosis not present

## 2017-03-14 MED ORDER — PHENTERMINE HCL 37.5 MG PO CAPS: 37.5000 mg | ORAL_CAPSULE | ORAL | 1 refills | Status: DC

## 2017-03-14 NOTE — Assessment & Plan Note (Signed)
BMI of 30.56 with most previous weighted loss regained secondary to poor nutrition and decreased physical activity. Educated about the importance of nutritional and lifestyle changes. Recommend weight loss of 5-10% of current body weight through nutrition and physical activity. Discussed 1200-1500-calorie per day nutritional intake. Start phentermine. Increase physical activity to goal of 30 minutes of moderate level activity daily with a goal steps around 10,000. Currently in the preparation/determination stage change. Continue to monitor.

## 2017-03-14 NOTE — Patient Instructions (Signed)
Thank you for choosing [facility].  SUMMARY AND INSTRUCTIONS:  Start phentermine.  Use MyFitnessPal to track your calories.   Recommend a 1200-1500 calorie nutritional intake.  Increase physical activity to 30 minutes of moderate activity daily with goal of 10,000 steps.   Follow up in 2 months or sooner.   Medication:  Your prescription(s) have been submitted to your pharmacy or been printed and provided for you. Please take as directed and contact our office if you believe you are having problem(s) with the medication(s) or have any questions.  Follow up:  If your symptoms worsen or fail to improve, please contact our office for further instruction, or in case of emergency go directly to the emergency room at the closest medical facility.

## 2017-03-14 NOTE — Progress Notes (Signed)
Subjective:    Patient ID: Krystal Humphrey, female    DOB: 1986/11/17, 31 y.o.   MRN: [id number]  Chief Complaint  Patient presents with  . Follow-up    Weight management    HPI:  Evalyne Humphrey is a 31 y.o. female who  has a past medical history of Anxiety; Depression; GERD (gastroesophageal reflux disease); Hiatal hernia; History of Clostridium difficile colitis; History of duodenal ulcer; History of esophagitis; History of panic attacks; Nephrolithiasis; and Urgency of urination. and presents today for a follow up office visit.   Weight management - Previously maintained on phentermine with significant weight loss but experienced as set back with pain in her bilateral knees and following with orthopedics. She has gained most of the weight back that she lost. Averaging about 2-3 meals per day consisting of a regular diet and working to improve smaller meals. Increasing fruit and vegetable intake. Has also started back running and exercising.  Wt Readings from Last 3 Encounters:  03/14/17 201 lb (91.2 kg)  02/15/17 190 lb (86.2 kg)  01/09/17 199 lb (90.3 kg)     Allergies  Allergen Reactions  . Remeron [Mirtazapine] Anaphylaxis  . Flagyl [Metronidazole] Nausea And Vomiting  . Tramadol Itching      Outpatient Medications Prior to Visit  Medication Sig Dispense Refill  . acetaminophen (TYLENOL) 500 MG tablet Take 1,000 mg by mouth every 6 (six) hours as needed.    Marland Kitchen acetaminophen-codeine 120-12 MG/5ML solution Take 10 mLs by mouth every 4 (four) hours as needed for moderate pain. (Patient not taking: Reported on 02/15/2017) 120 mL 0  . amoxicillin (AMOXIL) 500 MG capsule Take 1 capsule (500 mg total) by mouth 2 (two) times daily. (Patient not taking: Reported on 01/09/2017) 20 capsule 0  . aspirin-acetaminophen-caffeine (EXCEDRIN MIGRAINE) 250-250-65 MG tablet Take 2 tablets by mouth every 6 (six) hours as needed for headache.    . benzonatate (TESSALON) 100 MG capsule Take 1 capsule  (100 mg total) by mouth every 8 (eight) hours. (Patient not taking: Reported on 01/09/2017) 21 capsule 0  . butalbital-acetaminophen-caffeine (FIORICET, ESGIC) 50-325-40 MG tablet Take 1-2 tablets by mouth every 6 (six) hours as needed for headache. (Patient not taking: Reported on 02/15/2017) 20 tablet 0  . Etonogestrel (IMPLANON [place]) Inject into the skin.    . Guaifenesin 1200 MG TB12 Take 1 tablet (1,200 mg total) by mouth 2 (two) times daily. (Patient not taking: Reported on 02/15/2017) 20 each 0  . ibuprofen (ADVIL,MOTRIN) 800 MG tablet Take 1 tablet (800 mg total) by mouth every 8 (eight) hours as needed. (Patient not taking: Reported on 02/15/2017) 21 tablet 0  . naproxen sodium (ANAPROX) 220 MG tablet Take 440 mg by mouth 2 (two) times daily with a meal.    . ondansetron (ZOFRAN ODT) 4 MG disintegrating tablet Take 1 tablet (4 mg total) by mouth every 8 (eight) hours as needed for nausea or vomiting. (Patient not taking: Reported on 02/15/2017) 20 tablet 0  . PROAIR HFA 108 (90 Base) MCG/ACT inhaler INHALE 1 TO 2 PUFFS EVERY4 TO 6 HOURS AS NEEDED  0   No facility-administered medications prior to visit.       Review of Systems  Constitutional: Negative for chills and fever.  Eyes:       Negative for changes in vision  Respiratory: Negative for cough, chest tightness and wheezing.   Cardiovascular: Negative for chest pain, palpitations and leg swelling.  Neurological: Negative for dizziness, syncope, weakness, light-headedness and headaches.  Objective:    BP 118/80 (BP Location: Left Arm, Patient Position: Sitting, Cuff Size: Large)   Pulse 62   Temp 98.3 F (36.8 C) (Oral)   Resp 16   Ht  (1.727 m)   Wt 201 lb (91.2 kg)   SpO2 98%   BMI 30.56 kg/m  Nursing note and vital signs reviewed.  Physical Exam  Constitutional: She is oriented to person, place, and time. She appears well-developed and well-nourished. No distress.  Cardiovascular: Normal rate, regular rhythm,  normal heart sounds and intact distal pulses.   Pulmonary/Chest: Effort normal and breath sounds normal.  Neurological: She is alert and oriented to person, place, and time.  Skin: Skin is warm and dry.  Psychiatric: She has a normal mood and affect. Her behavior is normal. Judgment and thought content normal.       Assessment & Plan:   Problem List Items Addressed This Visit      Other   Obesity - Primary    BMI of 30.56 with most previous weighted loss regained secondary to poor nutrition and decreased physical activity. Educated about the importance of nutritional and lifestyle changes. Recommend weight loss of 5-10% of current body weight through nutrition and physical activity. Discussed 1200-1500-calorie per day nutritional intake. Start phentermine. Increase physical activity to goal of 30 minutes of moderate level activity daily with a goal steps around 10,000. Currently in the preparation/determination stage change. Continue to monitor.      Relevant Medications   phentermine 37.5 MG capsule       I have discontinued Ms. Seider's amoxicillin, benzonatate, naproxen sodium, aspirin-acetaminophen-caffeine, acetaminophen, Etonogestrel (IMPLANON [place]), ondansetron, butalbital-acetaminophen-caffeine, ibuprofen, Guaifenesin, acetaminophen-codeine, and PROAIR HFA. I am also having her start on phentermine.   Meds ordered this encounter  Medications  . phentermine 37.5 MG capsule    Sig: Take 1 capsule (37.5 mg total) by mouth every morning.    Dispense:  30 capsule    Refill:  1    Order Specific Question:   Supervising Provider    Answer:   Hillard Danker A [[id number]]     Follow-up: Return in about 3 months (around 06/13/2017), or if symptoms worsen or fail to improve.  Krystal Luz, FNP

## 2017-04-13 ENCOUNTER — Emergency Department (HOSPITAL_COMMUNITY): Payer: PRIVATE HEALTH INSURANCE

## 2017-04-13 ENCOUNTER — Emergency Department (HOSPITAL_COMMUNITY)
Admission: EM | Admit: 2017-04-13 | Discharge: 2017-04-13 | Disposition: A | Discharge status: Home / Self Care | Payer: PRIVATE HEALTH INSURANCE | Attending: Emergency Medicine | Admitting: Emergency Medicine

## 2017-04-13 ENCOUNTER — Encounter (HOSPITAL_COMMUNITY): Payer: Self-pay | Admitting: Emergency Medicine

## 2017-04-13 DIAGNOSIS — Z87891 Personal history of nicotine dependence: Secondary | ICD-10-CM | POA: Diagnosis not present

## 2017-04-13 DIAGNOSIS — R112 Nausea with vomiting, unspecified: Secondary | ICD-10-CM | POA: Diagnosis present

## 2017-04-13 LAB — CBC
HCT: 42.9 % (ref 36.0–46.0)
Hemoglobin: 14.9 g/dL (ref 12.0–15.0)
MCH: 31.2 pg (ref 26.0–34.0)
MCHC: 34.7 g/dL (ref 30.0–36.0)
MCV: 89.9 fL (ref 78.0–100.0)
PLATELETS: 337 10*3/uL (ref 150–400)
RBC: 4.77 MIL/uL (ref 3.87–5.11)
RDW: 12.8 % (ref 11.5–15.5)
WBC: 10.4 10*3/uL (ref 4.0–10.5)

## 2017-04-13 LAB — COMPREHENSIVE METABOLIC PANEL
ALT: 26 U/L (ref 14–54)
AST: 26 U/L (ref 15–41)
Albumin: 4.5 g/dL (ref 3.5–5.0)
Alkaline Phosphatase: 61 U/L (ref 38–126)
Anion gap: 8 (ref 5–15)
BILIRUBIN TOTAL: 1.2 mg/dL (ref 0.3–1.2)
BUN: 11 mg/dL (ref 6–20)
CALCIUM: 9.7 mg/dL (ref 8.9–10.3)
CHLORIDE: 105 mmol/L (ref 101–111)
CO2: 22 mmol/L (ref 22–32)
CREATININE: 0.95 mg/dL (ref 0.44–1.00)
GFR calc non Af Amer: >60 mL/min (ref >60)
Glucose, Bld: 98 mg/dL (ref 65–99)
Potassium: 3.7 mmol/L (ref 3.5–5.1)
Sodium: 135 mmol/L (ref 135–145)
TOTAL PROTEIN: 7.4 g/dL (ref 6.5–8.1)

## 2017-04-13 LAB — URINALYSIS, ROUTINE W REFLEX MICROSCOPIC
BILIRUBIN URINE: NEGATIVE
Bacteria, UA: NONE SEEN
GLUCOSE, UA: NEGATIVE mg/dL
KETONES UR: 5 mg/dL — AB
LEUKOCYTES UA: NEGATIVE
NITRITE: NEGATIVE
PH: 6 (ref 5.0–8.0)
Protein, ur: NEGATIVE mg/dL
SPECIFIC GRAVITY, URINE: 1.019 (ref 1.005–1.030)

## 2017-04-13 LAB — POC URINE PREG, ED: Preg Test, Ur: NEGATIVE

## 2017-04-13 LAB — LIPASE, BLOOD: LIPASE: 20 U/L (ref 11–51)

## 2017-04-13 MED ORDER — ONDANSETRON 4 MG PO TBDP
4.0000 mg | ORAL_TABLET | Freq: Once | ORAL | Status: AC | PRN
  Administered 2017-04-13: 4 mg via ORAL

## 2017-04-13 MED ORDER — MORPHINE SULFATE (PF) 4 MG/ML IV SOLN: 4.0000 mg | Freq: Once | INTRAVENOUS | Status: DC

## 2017-04-13 MED ORDER — ONDANSETRON HCL 4 MG/2ML IJ SOLN: 4.0000 mg | Freq: Once | INTRAMUSCULAR | Status: DC

## 2017-04-13 MED ORDER — ONDANSETRON HCL 4 MG PO TABS: 4.0000 mg | ORAL_TABLET | Freq: Four times a day (QID) | ORAL | 0 refills | Status: DC

## 2017-04-13 MED ORDER — ONDANSETRON 4 MG PO TBDP
ORAL_TABLET | ORAL | Status: AC
  Filled 2017-04-13: qty 1

## 2017-04-13 MED ORDER — ONDANSETRON HCL 4 MG PO TABS
4.0000 mg | ORAL_TABLET | Freq: Once | ORAL | Status: AC
  Administered 2017-04-13: 4 mg via ORAL
  Filled 2017-04-13: qty 1

## 2017-04-13 MED ORDER — ACETAMINOPHEN 500 MG PO TABS
1000.0000 mg | ORAL_TABLET | Freq: Once | ORAL | Status: AC
  Administered 2017-04-13: 1000 mg via ORAL
  Filled 2017-04-13: qty 2

## 2017-04-13 NOTE — ED Triage Notes (Signed)
Pt presents with vomiting since Sunday; pt states she started back a weight loss medication that was prescribed by her MD; pt states unable to keep anything down

## 2017-04-13 NOTE — ED Notes (Signed)
Pt states she has been on weight loss pill for a while but she missed so doses then started back on Saturday, then Sunday when the vomiting started.

## 2017-04-13 NOTE — ED Notes (Signed)
PT states understanding of care given, follow up care, and medication prescribed. PT ambulated from ED to car with a steady gait. 

## 2017-04-13 NOTE — ED Provider Notes (Signed)
[facility]EMER[facility]ENCY DEPT Provider Note   CSN: [id number] Arrival date & time: 04/13/17  0217     History   Chief Complaint Chief Complaint  Patient presents with  . Emesis    HPI Krystal Humphrey is a 31 y.o. female.  Patient presents to the emergency department with chief complaint of upper abdominal pain with associated nausea and vomiting. She states that the symptoms started on Sunday and have been persistent until now. She denies any fever, chills, or diarrhea. She denies any prior abdominal surgeries. She states that she has been taking phentermine for weight loss, and had discontinued it for short time. And then restarted at on the Saturday prior to onset of her symptoms. She denies any dysuria or vaginal discharge. There are no other associated symptoms. There are no modifying factors. She has not taken anything for her symptoms.   The history is provided by the patient. No language interpreter was used.    Past Medical History:  Diagnosis Date  . Anxiety   . Depression   . GERD (gastroesophageal reflux disease)   . Hiatal hernia   . History of Clostridium difficile colitis    04/ 2011  . History of duodenal ulcer    2009  . History of esophagitis   . History of panic attacks   . Nephrolithiasis    bilateral per ct 07-27-2016  L > R  (right side nonobstructive)  . Urgency of urination     Patient Active Problem List   Diagnosis Date Noted  . Acute pain of right knee 11/01/2016  . Nephrolithiasis 07/30/2016  . Flank pain   . Loose stools 02/06/2016  . Abdominal discomfort 01/19/2016  . Obesity 01/07/2015  . SVD (spontaneous vaginal delivery) 09/24/2011  . DEPRESSIVE TYPE PSYCHOSIS 08/21/2009  . ANEMIA, IRON DEFICIENCY 05/08/2009  . INSECT BITE 05/07/2009  . ULCER-DUODENAL 03/12/2009  . ABSENCE OF MENSTRUATION 10/02/2008  . EPIGASTRIC PAIN 10/01/2008  . REFLUX ESOPHAGITIS 10/10/2007    Past Surgical History:  Procedure Laterality Date  . CYSTOSCOPY WITH  RETROGRADE PYELOGRAM, URETEROSCOPY AND STENT PLACEMENT Left 07/30/2016   Procedure: CYSTOSCOPY WITH LEFT  RETROGRADE PYELOGRAM AND LEFT STENT PLACEMENT;  Surgeon: Malen GauzePatrick L McKenzie, MD;  Location: [facility];  Service: Urology;  Laterality: Left;  . CYSTOSCOPY WITH RETROGRADE PYELOGRAM, URETEROSCOPY AND STENT PLACEMENT Left 08/17/2016   Procedure: CYSTOSCOPY WITH LEFT RETROGRADE  STENT EXCHANGE, URETEROSCOPY AND STONE EXTRACTION, LASER LITHOTRIPSY AND LEFT RETROGRADE PYELOGRAM;  Surgeon: Bjorn PippinJohn Wrenn, MD;  [facility];  Service: Urology;  Laterality: Left;  . ESOPHAGOGASTRODUODENOSCOPY  last one 03-26-2009  . KNEE ARTHROSCOPY Right 03/19/2010  . TONSILLECTOMY  child    OB History    Gravida Para Term Preterm AB Living   2 1 1   1 1    SAB TAB Ectopic Multiple Live Births   1       1       Home Medications    Prior to Admission medications   Medication Sig Start Date End Date Taking? Authorizing Provider  phentermine 37.5 MG capsule Take 1 capsule (37.5 mg total) by mouth every morning. 03/14/17   Veryl Speakalone, Gregory D, FNP    Family History Family History  Problem Relation Age of Onset  . Diabetes Mother   . Thyroid disease Mother   . Heart attack Father   . Diabetes Maternal Aunt   . Diabetes Maternal Uncle   . COPD Maternal Grandmother   . Crohn's disease Maternal Grandmother   .  Anesthesia problems Neg Hx   . Hypotension Neg Hx   . Malignant hyperthermia Neg Hx   . Pseudochol deficiency Neg Hx     Social History Social History  Substance Use Topics  . Smoking status: Former Smoker    Packs/day: 0.25    Years: 2.00    Quit date: 09/20/2008  . Smokeless tobacco: Never Used  . Alcohol use Yes     Comment: occasional     Allergies   Remeron [mirtazapine]; Flagyl [metronidazole]; and Tramadol   Review of Systems Review of Systems  All other systems reviewed and are negative.    Physical Exam Updated Vital Signs BP 117/77 (BP Location: Right  Arm)   Pulse 74   Temp 98.2 F (36.8 C) (Oral)   Resp 16   Ht 5\' 8"  (1.727 m)   Wt 91.2 kg   SpO2 100%   BMI 30.56 kg/m   Physical Exam  Constitutional: She is oriented to person, place, and time. She appears well-developed and well-nourished.  HENT:  Head: Normocephalic and atraumatic.  Eyes: Conjunctivae and EOM are normal. Pupils are equal, round, and reactive to light.  Neck: Normal range of motion. Neck supple.  Cardiovascular: Normal rate and regular rhythm.  Exam reveals no gallop and no friction rub.   No murmur heard. Pulmonary/Chest: Effort normal and breath sounds normal. No respiratory distress. She has no wheezes. She has no rales. She exhibits no tenderness.  Abdominal: Soft. Bowel sounds are normal. She exhibits no distension and no mass. There is tenderness. There is no rebound and no guarding.  Right upper quadrant tenderness palpation, no other focal abdominal tenderness  Musculoskeletal: Normal range of motion. She exhibits no edema or tenderness.  Neurological: She is alert and oriented to person, place, and time.  Skin: Skin is warm and dry.  Psychiatric: She has a normal mood and affect. Her behavior is normal. Judgment and thought content normal.  Nursing note and vitals reviewed.    ED Treatments / Results  Labs (all labs ordered are listed, but only abnormal results are displayed) Labs Reviewed  URINALYSIS, ROUTINE W REFLEX MICROSCOPIC - Abnormal; Notable for the following:       Result Value   APPearance HAZY (*)    Hgb urine dipstick SMALL (*)    Ketones, ur 5 (*)    Squamous Epithelial / LPF 6-30 (*)    All other components within normal limits  LIPASE, BLOOD  COMPREHENSIVE METABOLIC PANEL  CBC  POC URINE PREG, ED    EKG  EKG Interpretation None       Radiology No results found.  Procedures Procedures (including critical care time)  Medications Ordered in ED Medications  morphine 4 MG/ML injection 4 mg (not administered)    ondansetron (ZOFRAN) injection 4 mg (not administered)  ondansetron (ZOFRAN-ODT) disintegrating tablet 4 mg (4 mg Oral Given 04/13/17 0231)     Initial Impression / Assessment and Plan / ED Course  I have reviewed the triage vital signs and the nursing notes.  Pertinent labs & imaging results that were available during my care of the patient were reviewed by me and considered in my medical decision making (see chart for details).     Patient with nausea and vomiting and right upper quadrant pain. LFTs are normal. Vital signs are stable. No leukocytosis. However, given the amount of tenderness, will check right upper quadrant ultrasound. Will treat pain and nausea. Will reassess.  [place] is normal.  DC to home  with zofran and close follow-up with PCP.    Final Clinical Impressions(s) / ED Diagnoses   Final diagnoses:  Non-intractable vomiting with nausea, unspecified vomiting type    New Prescriptions New Prescriptions   ONDANSETRON (ZOFRAN) 4 MG TABLET    Take 1 tablet (4 mg total) by mouth every 6 (six) hours.     Roxy Horseman, PA-C 04/13/17 [id number]    Dione Booze, MD 04/13/17 [phone]

## 2017-04-13 NOTE — Discharge Instructions (Signed)
It is suggested that you discontinue your phentermine until your symptoms resolve.  Please discuss your symptoms with your regular doctor.  Return to the ER if your symptoms worsen.

## 2017-04-13 NOTE — ED Notes (Signed)
Attempted IV x 2 unsuccessful.

## 2017-04-14 ENCOUNTER — Encounter: Payer: Self-pay | Admitting: Family

## 2017-04-14 ENCOUNTER — Other Ambulatory Visit: Payer: PRIVATE HEALTH INSURANCE

## 2017-04-14 ENCOUNTER — Ambulatory Visit (INDEPENDENT_AMBULATORY_CARE_PROVIDER_SITE_OTHER): Payer: PRIVATE HEALTH INSURANCE | Admitting: Family

## 2017-04-14 VITALS — BP 102/72 | HR 90 | Temp 98.3°F | Resp 16 | Ht 68.0 in | Wt 187.0 lb

## 2017-04-14 DIAGNOSIS — R1114 Bilious vomiting: Secondary | ICD-10-CM | POA: Diagnosis not present

## 2017-04-14 MED ORDER — ONDANSETRON 4 MG PO TBDP: 4.0000 mg | ORAL_TABLET | Freq: Two times a day (BID) | ORAL | 0 refills | Status: DC

## 2017-04-14 NOTE — Assessment & Plan Note (Signed)
New onset bilious vomiting with nausea that has been going on for 5 days with no fevers concerning for gall bladder or pancreatitis. Limited RUQ ultrasound with no significant findings and lipase was normal which is reassuring. Cannot rule out viral gastritis. Obtain CT. Change Zofran to dissolvable tablets. Recommend clear liquids and sports drinks and progress diet as tolerated. Follow up if symptoms worsen or do not improve prior to imaging.

## 2017-04-14 NOTE — Patient Instructions (Addendum)
Thank you for choosing [facility].  SUMMARY AND INSTRUCTIONS:  Recommend frequent small sips and bites as tolerated. Start with clears and progress as tolerated.   Zofran as needed for nausea.   They will call to schedule your CT scan.  We will check your pancreas today.   Medication:  Your prescription(s) have been submitted to your pharmacy or been printed and provided for you. Please take as directed and contact our office if you believe you are having problem(s) with the medication(s) or have any questions.  Labs:  Please stop by the lab on the lower level of the building for your blood work. Your results will be released to MyChart (or called to you) after review, usually within 72 hours after test completion. If any changes need to be made, you will be notified at that same time.  1.) The lab is open from 7:30am to 5:30 pm Monday-Friday 2.) No appointment is necessary 3.) Fasting (if needed) is 6-8 hours after food and drink; black coffee and water are okay   Follow up:  If your symptoms worsen or fail to improve, please contact our office for further instruction, or in case of emergency go directly to the emergency room at the closest medical facility.

## 2017-04-14 NOTE — Progress Notes (Signed)
Subjective:    Patient ID: Krystal Humphrey, female    DOB: 1986/05/16, 31 y.o.   MRN: [id number]  Chief Complaint  Patient presents with  . Vomiting    starting 4 days ago, has been throwing up, last threw up yesterday, has not been able to keep anything on stomach    HPI:  Krystal Humphrey is a 31 y.o. female who  has a past medical history of Anxiety; Depression; GERD (gastroesophageal reflux disease); Hiatal hernia; History of Clostridium difficile colitis; History of duodenal ulcer; History of esophagitis; History of panic attacks; Nephrolithiasis; and Urgency of urination. and presents today For an acute office visit.  This is a new problem. Associated symptoms of pain located in her epigastric region, nausea and vomiting have been going on for about 3 days. Last episode of vomiting was yesterday. Was seen in the ED and prescribed Zofran for nausea which she reports taking as prescribed and denies adverse side effects. Zofran does not help very much.  Limited US of the abdomen was negative and pregnancy test was negative. No constipation or diarrhea. Decreased appetite. Unable to eat or drink secondary to symptoms. Last tried water this morning. No fevers.   Allergies  Allergen Reactions  . Remeron [Mirtazapine] Anaphylaxis  . Flagyl [Metronidazole] Nausea And Vomiting  . Tramadol Itching      Outpatient Medications Prior to Visit  Medication Sig Dispense Refill  . phentermine 37.5 MG capsule Take 1 capsule (37.5 mg total) by mouth every morning. 30 capsule 1  . ondansetron (ZOFRAN) 4 MG tablet Take 1 tablet (4 mg total) by mouth every 6 (six) hours. 12 tablet 0   No facility-administered medications prior to visit.       Past Surgical History:  Procedure Laterality Date  . CYSTOSCOPY WITH RETROGRADE PYELOGRAM, URETEROSCOPY AND STENT PLACEMENT Left 07/30/2016   Procedure: CYSTOSCOPY WITH LEFT  RETROGRADE PYELOGRAM AND LEFT STENT PLACEMENT;  Surgeon: Malen Gauze, MD;   Location: [facility];  Service: Urology;  Laterality: Left;  . CYSTOSCOPY WITH RETROGRADE PYELOGRAM, URETEROSCOPY AND STENT PLACEMENT Left 08/17/2016   Procedure: CYSTOSCOPY WITH LEFT RETROGRADE  STENT EXCHANGE, URETEROSCOPY AND STONE EXTRACTION, LASER LITHOTRIPSY AND LEFT RETROGRADE PYELOGRAM;  Surgeon: Bjorn Pippin, MD;  Location: [facility];  Service: Urology;  Laterality: Left;  . ESOPHAGOGASTRODUODENOSCOPY  last one 03-26-2009  . KNEE ARTHROSCOPY Right 03/19/2010  . TONSILLECTOMY  child      Past Medical History:  Diagnosis Date  . Anxiety   . Depression   . GERD (gastroesophageal reflux disease)   . Hiatal hernia   . History of Clostridium difficile colitis    04/ 2011  . History of duodenal ulcer    2009  . History of esophagitis   . History of panic attacks   . Nephrolithiasis    bilateral per ct 07-27-2016  L > R  (right side nonobstructive)  . Urgency of urination       Review of Systems  Constitutional: Negative for chills and fever.  Respiratory: Negative for chest tightness and shortness of breath.   Cardiovascular: Negative for chest pain, palpitations and leg swelling.  Gastrointestinal: Positive for abdominal pain, nausea and vomiting. Negative for abdominal distention, anal bleeding, blood in stool, constipation, diarrhea and rectal pain.      Objective:    BP 102/72 (BP Location: Left Arm, Patient Position: Sitting, Cuff Size: Large)   Pulse 90   Temp 98.3 F (36.8 C) (Oral)   Resp 16  Ht 5\' 8"  (1.727 m)   Wt 187 lb (84.8 kg)   SpO2 98%   BMI 28.43 kg/m  Nursing note and vital signs reviewed.  Physical Exam  Constitutional: She is oriented to person, place, and time. She appears well-developed and well-nourished. No distress.  Cardiovascular: Normal rate, regular rhythm, normal heart sounds and intact distal pulses.   Pulmonary/Chest: Effort normal and breath sounds normal.  Abdominal: Normal appearance and bowel sounds are normal.  There is no hepatosplenomegaly. There is tenderness in the right lower quadrant, epigastric area and periumbilical area. There is no rigidity, no rebound, no guarding, no tenderness at McBurney's point and negative Murphy's sign.  Neurological: She is alert and oriented to person, place, and time.  Skin: Skin is warm and dry.  Psychiatric: She has a normal mood and affect. Her behavior is normal. Judgment and thought content normal.       Assessment & Plan:   Problem List Items Addressed This Visit      Digestive   Bilious vomiting with nausea - Primary    New onset bilious vomiting with nausea that has been going on for 5 days with no fevers concerning for gall bladder or pancreatitis. Limited RUQ ultrasound with no significant findings and lipase was normal which is reassuring. Cannot rule out viral gastritis. Obtain CT. Change Zofran to dissolvable tablets. Recommend clear liquids and sports drinks and progress diet as tolerated. Follow up if symptoms worsen or do not improve prior to imaging.       Relevant Orders   CT Abdomen Pelvis W Contrast       I have discontinued Ms. Brownfield's ondansetron. I am also having her start on ondansetron. Additionally, I am having her maintain her phentermine.   Meds ordered this encounter  Medications  . ondansetron (ZOFRAN ODT) 4 MG disintegrating tablet    Sig: Take 1-2 tablets (4-8 mg total) by mouth 2 (two) times daily.    Dispense:  40 tablet    Refill:  0    Order Specific Question:   Supervising Provider    Answer:   Hillard DankerRAWFORD, ELIZABETH A [[id number]]     Follow-up: No Follow-up on file.  Jeanine Luzalone, Berneda Piccininni, FNP

## 2017-04-15 ENCOUNTER — Ambulatory Visit: Payer: Self-pay

## 2017-04-21 ENCOUNTER — Ambulatory Visit: Payer: PRIVATE HEALTH INSURANCE | Admitting: Family

## 2017-04-26 ENCOUNTER — Other Ambulatory Visit: Payer: PRIVATE HEALTH INSURANCE

## 2017-04-26 ENCOUNTER — Encounter: Payer: Self-pay | Admitting: Family

## 2017-04-26 ENCOUNTER — Ambulatory Visit (INDEPENDENT_AMBULATORY_CARE_PROVIDER_SITE_OTHER): Payer: Medicaid Other | Admitting: Family

## 2017-04-26 VITALS — BP 124/82 | HR 86 | Temp 98.5°F | Resp 16 | Ht 68.0 in | Wt 196.8 lb

## 2017-04-26 DIAGNOSIS — N200 Calculus of kidney: Secondary | ICD-10-CM

## 2017-04-26 LAB — POCT URINALYSIS DIPSTICK
Bilirubin, UA: NEGATIVE
Blood, UA: NEGATIVE
Glucose, UA: NEGATIVE
Ketones, UA: NEGATIVE
Leukocytes, UA: NEGATIVE
Nitrite, UA: NEGATIVE
Protein, UA: NEGATIVE
Spec Grav, UA: 1.01 (ref 1.010–1.025)
Urobilinogen, UA: 0.2 U/dL
pH, UA: 7.5 (ref 5.0–8.0)

## 2017-04-26 NOTE — Assessment & Plan Note (Signed)
New-onset flank pain in the setting of previous history of noted kidney stone on CT scan measuring approximately 2 mm. In office urinalysis negative for leukocytes, nitrites, and hematuria. There is concern for enlargement of previous kidney stone. Obtain CT scan to determine current status. Consider Flomax. Follow-up pending imaging results.

## 2017-04-26 NOTE — Progress Notes (Signed)
Subjective:    Patient ID: Krystal Humphrey, female    DOB: Apr 27, 1986, 31 y.o.   MRN: [id number]  Chief Complaint  Patient presents with  . Flank Pain    left sided flank pain, had a kidney stone in october, when the pain comes it is really bad,     HPI:  Krystal Humphrey is a 31 y.o. female who  has a past medical history of Anxiety; Depression; GERD (gastroesophageal reflux disease); Hiatal hernia; History of Clostridium difficile colitis; History of duodenal ulcer; History of esophagitis; History of panic attacks; Nephrolithiasis; and Urgency of urination. and presents today for an acute office visit.   This is a new problem. Associated symptom of right sided flank pain has been going on for about 2 weeks. Pain generally waxes and wanes. Pain described as sharp. No urinary symptoms or fevers. Did have a kidney stone in October on the left side. Previous CT scan showing a 2 mm stone.   Allergies  Allergen Reactions  . Remeron [Mirtazapine] Anaphylaxis  . Flagyl [Metronidazole] Nausea And Vomiting  . Tramadol Itching      Outpatient Medications Prior to Visit  Medication Sig Dispense Refill  . phentermine 37.5 MG capsule Take 1 capsule (37.5 mg total) by mouth every morning. 30 capsule 1  . ondansetron (ZOFRAN ODT) 4 MG disintegrating tablet Take 1-2 tablets (4-8 mg total) by mouth 2 (two) times daily. 40 tablet 0   No facility-administered medications prior to visit.       Past Surgical History:  Procedure Laterality Date  . CYSTOSCOPY WITH RETROGRADE PYELOGRAM, URETEROSCOPY AND STENT PLACEMENT Left 07/30/2016   Procedure: CYSTOSCOPY WITH LEFT  RETROGRADE PYELOGRAM AND LEFT STENT PLACEMENT;  Surgeon: Malen Gauze, MD;  Location: [facility];  Service: Urology;  Laterality: Left;  . CYSTOSCOPY WITH RETROGRADE PYELOGRAM, URETEROSCOPY AND STENT PLACEMENT Left 08/17/2016   Procedure: CYSTOSCOPY WITH LEFT RETROGRADE  STENT EXCHANGE, URETEROSCOPY AND STONE EXTRACTION, LASER LITHOTRIPSY  AND LEFT RETROGRADE PYELOGRAM;  Surgeon: Bjorn Pippin, MD;  Location: [facility];  Service: Urology;  Laterality: Left;  . ESOPHAGOGASTRODUODENOSCOPY  last one 03-26-2009  . KNEE ARTHROSCOPY Right 03/19/2010  . TONSILLECTOMY  child      Past Medical History:  Diagnosis Date  . Anxiety   . Depression   . GERD (gastroesophageal reflux disease)   . Hiatal hernia   . History of Clostridium difficile colitis    04/ 2011  . History of duodenal ulcer    2009  . History of esophagitis   . History of panic attacks   . Nephrolithiasis    bilateral per ct 07-27-2016  L > R  (right side nonobstructive)  . Urgency of urination       Review of Systems  Constitutional: Negative for chills and fever.  Genitourinary: Positive for flank pain. Negative for dysuria, frequency, hematuria, pelvic pain and urgency.      Objective:    BP 124/82 (BP Location: Left Arm, Patient Position: Sitting, Cuff Size: Large)   Pulse 86   Temp 98.5 F (36.9 C) (Oral)   Resp 16   Ht 5\' 8"  (1.727 m)   Wt 196 lb 12.8 oz (89.3 kg)   SpO2 98%   BMI 29.92 kg/m  Nursing note and vital signs reviewed.  Physical Exam  Constitutional: She is oriented to person, place, and time. She appears well-developed and well-nourished. No distress.  Cardiovascular: Normal rate, regular rhythm, normal heart sounds and intact distal pulses.  Pulmonary/Chest: Effort normal and breath sounds normal.  Abdominal: Normal appearance and bowel sounds are normal. She exhibits no mass. There is no hepatosplenomegaly. There is no tenderness. There is CVA tenderness. There is no rigidity, no rebound, no guarding, no tenderness at McBurney's point and negative Murphy's sign.  Neurological: She is alert and oriented to person, place, and time.  Skin: Skin is warm and dry.  Psychiatric: She has a normal mood and affect. Her behavior is normal. Judgment and thought content normal.       Assessment & Plan:   Problem  List Items Addressed This Visit      Genitourinary   Kidney stone - Primary    New-onset flank pain in the setting of previous history of noted kidney stone on CT scan measuring approximately 2 mm. In office urinalysis negative for leukocytes, nitrites, and hematuria. There is concern for enlargement of previous kidney stone. Obtain CT scan to determine current status. Consider Flomax. Follow-up pending imaging results.      Relevant Orders   POCT urinalysis dipstick (Completed)   Urine culture   CT Abdomen Pelvis Wo Contrast       I have discontinued Ms. Coxe's ondansetron. I am also having her maintain her phentermine.   Follow-up: Return if symptoms worsen or fail to improve.  Jeanine Luzalone, Gregory, FNP

## 2017-04-26 NOTE — Patient Instructions (Signed)
Thank you for choosing [facility].  SUMMARY AND INSTRUCTIONS:  They will call to schedule your CT scan.   Follow up:  If your symptoms worsen or fail to improve, please contact our office for further instruction, or in case of emergency go directly to the emergency room at the closest medical facility.    Kidney Stones Kidney stones (urolithiasis) are rock-like masses that form inside of the kidneys. Kidneys are organs that make pee (urine). A kidney stone can cause very bad pain and can block the flow of pee. The stone usually leaves your body (passes) through your pee. You may need to have a doctor take out the stone. Follow these instructions at home: Eating and drinking   Drink enough fluid to keep your pee clear or pale yellow. This will help you pass the stone.  If told by your doctor, change the foods you eat (your diet). This may include:  Limiting how much salt (sodium) you eat.  Eating more fruits and vegetables.  Limiting how much meat, poultry, fish, and eggs you eat.  Follow instructions from your doctor about eating or drinking restrictions. General instructions   Collect pee samples as told by your doctor. You may need to collect a pee sample:  24 hours after a stone comes out.  8-12 weeks after a stone comes out, and every 6-12 months after that.  Strain your pee every time you pee (urinate), for as long as told. Use the strainer that your doctor recommends.  Do not throw out the stone. Keep it so that it can be tested by your doctor.  Take over-the-counter and prescription medicines only as told by your doctor.  Keep all follow-up visits as told by your doctor. This is important. You may need follow-up tests. Preventing kidney stones  To prevent another kidney stone:  Drink enough fluid to keep your pee clear or pale yellow. This is the best way to prevent kidney stones.  Eat healthy foods.  Avoid certain foods as told by your doctor. You may  be told to eat less protein.  Stay at a healthy weight. Contact a doctor if:  You have pain that gets worse or does not get better with medicine. Get help right away if:  You have a fever or chills.  You get very bad pain.  You get new pain in your belly (abdomen).  You pass out (faint).  You cannot pee. This information is not intended to replace advice given to you by your health care provider. Make sure you discuss any questions you have with your health care provider. Document Released: 05/03/2008 Document Revised: 08/03/2016 Document Reviewed: 08/03/2016 Elsevier Interactive Patient Education  2017 [employer]

## 2017-04-27 LAB — URINE CULTURE: Organism ID, Bacteria: NO GROWTH

## 2017-04-28 ENCOUNTER — Ambulatory Visit
Admission: RE | Admit: 2017-04-28 | Discharge: 2017-04-28 | Disposition: A | Discharge status: Home / Self Care | Payer: PRIVATE HEALTH INSURANCE | Source: Ambulatory Visit | Attending: Family | Admitting: Family

## 2017-04-28 DIAGNOSIS — N200 Calculus of kidney: Secondary | ICD-10-CM

## 2017-05-16 ENCOUNTER — Encounter: Payer: Self-pay | Admitting: Family

## 2017-05-16 ENCOUNTER — Ambulatory Visit (INDEPENDENT_AMBULATORY_CARE_PROVIDER_SITE_OTHER)
Admission: RE | Admit: 2017-05-16 | Discharge: 2017-05-16 | Disposition: A | Discharge status: Home / Self Care | Payer: Medicaid Other | Source: Ambulatory Visit | Attending: Family | Admitting: Family

## 2017-05-16 ENCOUNTER — Ambulatory Visit (INDEPENDENT_AMBULATORY_CARE_PROVIDER_SITE_OTHER): Payer: PRIVATE HEALTH INSURANCE | Admitting: Family

## 2017-05-16 VITALS — BP 110/70 | HR 86 | Temp 98.5°F | Resp 16 | Ht 68.0 in | Wt 194.8 lb

## 2017-05-16 DIAGNOSIS — J301 Allergic rhinitis due to pollen: Secondary | ICD-10-CM | POA: Insufficient documentation

## 2017-05-16 DIAGNOSIS — M79671 Pain in right foot: Secondary | ICD-10-CM

## 2017-05-16 MED ORDER — PREDNISONE 20 MG PO TABS: 20.0000 mg | ORAL_TABLET | Freq: Two times a day (BID) | ORAL | 0 refills | Status: DC | PRN

## 2017-05-16 NOTE — Progress Notes (Signed)
Subjective:    Patient ID: Krystal AugustBrandi Humphrey, female    DOB: January 07, 1986, 31 y.o.   MRN: [id number]  Chief Complaint  Patient presents with  . Cough    x2 days, productive cough, nasal congestion, right foot pain    HPI:  Krystal Humphrey is a 31 y.o. female who  has a past medical history of Anxiety; Depression; GERD (gastroesophageal reflux disease); Hiatal hernia; History of Clostridium difficile colitis; History of duodenal ulcer; History of esophagitis; History of panic attacks; Nephrolithiasis; and Urgency of urination. and presents today for an office visit.   1.) Illness - This is a new problem. Associated symptom of cough and congestion has been going on for about 2 days. No fevers. Modifying factors include Claritin which has not helped. Endorses lots of sneezing. Occasional watery eyes.   2.) Foot pain - Associated symptom of pain located on the top of her right foot has been going on for over a year with previous diagnosis of stress fracture. Was less than compliant with the immobilization. No new injury to trauma.  Describes the pain as shooting. No modifying factors or attempted treatments. 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 Remeron [Mirtazapine] Anaphylaxis  . Flagyl [Metronidazole] Nausea And Vomiting  . Tramadol Itching      Outpatient Medications Prior to Visit  Medication Sig Dispense Refill  . phentermine 37.5 MG capsule Take 1 capsule (37.5 mg total) by mouth every morning. 30 capsule 1   No facility-administered medications prior to visit.       Past Surgical History:  Procedure Laterality Date  . CYSTOSCOPY WITH RETROGRADE PYELOGRAM, URETEROSCOPY AND STENT PLACEMENT Left 07/30/2016   Procedure: CYSTOSCOPY WITH LEFT  RETROGRADE PYELOGRAM AND LEFT STENT PLACEMENT;  Surgeon: Malen GauzePatrick L McKenzie, MD;  Location: [facility];  Service: Urology;  Laterality: Left;  . CYSTOSCOPY WITH RETROGRADE PYELOGRAM,  URETEROSCOPY AND STENT PLACEMENT Left 08/17/2016   Procedure: CYSTOSCOPY WITH LEFT RETROGRADE  STENT EXCHANGE, URETEROSCOPY AND STONE EXTRACTION, LASER LITHOTRIPSY AND LEFT RETROGRADE PYELOGRAM;  Surgeon: Bjorn PippinJohn Wrenn, MD;  [facility] ;  Service: Urology;  Laterality: Left;  . ESOPHAGOGASTRODUODENOSCOPY  last one 03-26-2009  . KNEE ARTHROSCOPY Right 03/19/2010  . TONSILLECTOMY  child      Past Medical History:  Diagnosis Date  . Anxiety   . Depression   . GERD (gastroesophageal reflux disease)   . Hiatal hernia   . History of Clostridium difficile colitis    04/ 2011  . History of duodenal ulcer    2009  . History of esophagitis   . History of panic attacks   . Nephrolithiasis    bilateral per ct 07-27-2016  L > R  (right side nonobstructive)  . Urgency of urination       Review of Systems  Constitutional: Negative for chills and fever.  HENT: Positive for congestion and sneezing. Negative for sinus pain, sinus pressure and sore throat.   Respiratory: Positive for cough.   Cardiovascular: Negative for chest pain, palpitations and leg swelling.  Musculoskeletal:       Positive for right foot pain.       Objective:    BP 110/70 (BP Location: Left Arm, Patient Position: Sitting, Cuff Size: Large)   Pulse 86   Temp 98.5 F (36.9 C) (Oral)   Resp 16   Ht 5\' 8"  (1.727 m)   Wt 194 lb 12.8 oz (88.4 kg)  SpO2 98%   BMI 29.62 kg/m  Nursing note and vital signs reviewed.  Physical Exam  Constitutional: She is oriented to person, place, and time. She appears well-developed and well-nourished. No distress.  HENT:  Right Ear: Hearing, tympanic membrane, external ear and ear canal normal.  Left Ear: Hearing, tympanic membrane, external ear and ear canal normal.  Nose: Nose normal. Right sinus exhibits no maxillary sinus tenderness and no frontal sinus tenderness. Left sinus exhibits no maxillary sinus tenderness and no frontal sinus tenderness.    Mouth/Throat: Uvula is midline, oropharynx is clear and moist and mucous membranes are normal.  Cardiovascular: Normal rate, regular rhythm, normal heart sounds and intact distal pulses.   Pulmonary/Chest: Effort normal and breath sounds normal.  Musculoskeletal:  Right foot - no obvious deformity or discoloration with mild edema located on the dorsum of the foot. Palpable tenderness of third metatarsal with no deformity or crepitus. Range of motion within normal limits. Capillary refill intact and appropriate.  Neurological: She is alert and oriented to person, place, and time.  Skin: Skin is warm and dry.  Psychiatric: She has a normal mood and affect. Her behavior is normal. Judgment and thought content normal.       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 Recommend conservative treatment with over-the-counter medications as needed for symptom relief and supportive care. Start prednisone as needed for symptom relief if over-the-counter medications are ineffective. Follow-up if symptoms worsen or do not improve.        Other   Right foot pain - Primary    Exacerbation of right foot pain with concern for possible fracture/stress fracture. Obtain x-ray. 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 Recommend Cam Walker/surgical shoe 3 weeks. Icing regimen as needed for discomfort with over-the-counter medications as needed for symptom relief and supportive care. Follow-up pending x-ray results.      Relevant Orders   DG Foot Complete Right (Completed)       I am having [name] start on predniSONE. I am also having her maintain her phentermine.   Meds ordered this encounter  Medications  . predniSONE (DELTASONE) 20 MG tablet    Sig: Take 1  tablet (20 mg total) by mouth 2 (two) times daily as needed.    Dispense:  10 tablet    Refill:  0    Order Specific Question:   Supervising Provider    Answer:   Hillard Danker A [[id number]]     Follow-up: Return if symptoms worsen or fail to improve.  Jeanine Luz, FNP

## 2017-05-16 NOTE — Patient Instructions (Signed)
Thank you for choosing [facility].  SUMMARY AND INSTRUCTIONS:  Recommend over the counter medications including Zyrtec-D, Claritin-D, or Allegra-D  For your foot - good supportive shoes and additional treatment pending x-ray results. Tylenol and ibuprofen as needed for swelling and pain.  Ice x 20 minutes every 2 hours and especially after work.   Medication:  Your prescription(s) have been submitted to your pharmacy or been printed and provided for you. Please take as directed and contact our office if you believe you are having problem(s) with the medication(s) or have any questions.  Imaging / Radiology:  Please stop by radiology on the basement level of the building for your x-rays. Your results will be released to MyChart (or called to you) after review, usually within 72 hours after test completion. If any treatments or changes are necessary, you will be notified at that same time.  Follow up:  If your symptoms worsen or fail to improve, please contact our office for further instruction, or in case of emergency go directly to the emergency room at the closest medical facility.   General Recommendations:    Please drink plenty of fluids.  Get plenty of rest   Sleep in humidified air  Use saline nasal sprays  Netti pot   OTC Medications:  Decongestants - helps relieve congestion   Flonase (generic fluticasone) or Nasacort (generic triamcinolone) - please make sure to use the "cross-over" technique at a 45 degree angle towards the opposite eye as opposed to straight up the nasal passageway.   Sudafed (generic pseudoephedrine - Note this is the one that is available behind the pharmacy counter); Products with phenylephrine (-PE) may also be used but is often not as effective as pseudoephedrine.   If you have HIGH BLOOD PRESSURE - Coricidin HBP; AVOID any product that is -D as this contains pseudoephedrine which may increase your blood pressure.  Afrin  (oxymetazoline) every 6-8 hours for up to 3 days.   Allergies - helps relieve runny nose, itchy eyes and sneezing   Claritin (generic loratidine), Allegra (fexofenidine), or Zyrtec (generic cyrterizine) for runny nose. These medications should not cause drowsiness.  Note - Benadryl (generic diphenhydramine) may be used however may cause drowsiness  Cough -   Delsym or Robitussin (generic dextromethorphan)  Expectorants - helps loosen mucus to ease removal   Mucinex (generic guaifenesin) as directed on the package.  Headaches / General Aches   Tylenol (generic acetaminophen) - DO NOT EXCEED 3 grams (3,000 mg) in a 24 hour time period  Advil/Motrin (generic ibuprofen)   Sore Throat -   Salt water gargle   Chloraseptic (generic benzocaine) spray or lozenges / Sucrets (generic dyclonine)

## 2017-05-16 NOTE — Assessment & Plan Note (Signed)
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 Recommend conservative treatment with over-the-counter medications as needed for symptom relief and supportive care. Start prednisone as needed for symptom relief if over-the-counter medications are ineffective. Follow-up if symptoms worsen or do not improve.

## 2017-05-16 NOTE — Assessment & Plan Note (Signed)
Exacerbation of right foot pain with concern for possible fracture/stress fracture. Obtain x-ray. 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 Recommend Cam Walker/surgical shoe 3 weeks. Icing regimen as needed for discomfort with over-the-counter medications as needed for symptom relief and supportive care. Follow-up pending x-ray results.

## 2017-05-17 ENCOUNTER — Telehealth: Payer: Self-pay | Admitting: Family

## 2017-05-17 NOTE — Telephone Encounter (Signed)
Patient states she has left her boot at her daughter's dance studio.  States it was thrown away.  Would like to get another. Would like boot filed with insurance if possible.  Patient is in town right now.  Would like to know if she can pick up today if there is any in stock.  Please call in regard.

## 2017-05-17 NOTE — Telephone Encounter (Addendum)
Notified patient to head on over per Tammy SoursGreg to get fitted for boot.  Notified front to get Mercer Podaylor Wrenn.

## 2017-05-26 ENCOUNTER — Ambulatory Visit (INDEPENDENT_AMBULATORY_CARE_PROVIDER_SITE_OTHER): Payer: PRIVATE HEALTH INSURANCE | Admitting: Internal Medicine

## 2017-05-26 ENCOUNTER — Encounter: Payer: Self-pay | Admitting: Internal Medicine

## 2017-05-26 VITALS — BP 144/82 | HR 78 | Ht 68.0 in | Wt 196.0 lb

## 2017-05-26 DIAGNOSIS — J301 Allergic rhinitis due to pollen: Secondary | ICD-10-CM

## 2017-05-26 DIAGNOSIS — J019 Acute sinusitis, unspecified: Secondary | ICD-10-CM | POA: Diagnosis not present

## 2017-05-26 DIAGNOSIS — R062 Wheezing: Secondary | ICD-10-CM | POA: Diagnosis not present

## 2017-05-26 MED ORDER — METHYLPREDNISOLONE ACETATE 80 MG/ML IJ SUSP
80.0000 mg | Freq: Once | INTRAMUSCULAR | Status: AC
  Administered 2017-05-26: 80 mg via INTRAMUSCULAR

## 2017-05-26 MED ORDER — HYDROCODONE-HOMATROPINE 5-1.5 MG/5ML PO SYRP: 5.0000 mL | ORAL_SOLUTION | Freq: Four times a day (QID) | ORAL | 0 refills | Status: AC | PRN

## 2017-05-26 MED ORDER — PREDNISONE 10 MG PO TABS: ORAL_TABLET | ORAL | 0 refills | Status: DC

## 2017-05-26 MED ORDER — LEVOFLOXACIN 500 MG PO TABS: 500.0000 mg | ORAL_TABLET | Freq: Every day | ORAL | 0 refills | Status: AC

## 2017-05-26 MED ORDER — TRIAMCINOLONE ACETONIDE 55 MCG/ACT NA AERO: 2.0000 | INHALATION_SPRAY | Freq: Every day | NASAL | 12 refills | Status: DC

## 2017-05-26 NOTE — Progress Notes (Signed)
Subjective:    Patient ID: Krystal Humphrey, female    DOB: 09-Jul-1986, 31 y.o.   MRN: [id number]  HPI   Here with 2-3 days acute onset fever, severe left earache, mild right earache, mild facial pain, pressure, headache, general weakness and malaise, mild sob and wheeziness, and greenish d/c and mild non prod cough, but pt denies chest pain, orthopnea, PND, increased LE swelling, palpitations, dizziness or syncope.   Not better with zyrtec D., nothing else makes better or worse.  Does have several wks ongoing nasal allergy symptoms with clearish congestion, itch and sneezing Past Medical History:  Diagnosis Date  . Anxiety   . Depression   . GERD (gastroesophageal reflux disease)   . Hiatal hernia   . History of Clostridium difficile colitis    04/ 2011  . History of duodenal ulcer    2009  . History of esophagitis   . History of panic attacks   . Nephrolithiasis    bilateral per ct 07-27-2016  L > R  (right side nonobstructive)  . Urgency of urination    Past Surgical History:  Procedure Laterality Date  . CYSTOSCOPY WITH RETROGRADE PYELOGRAM, URETEROSCOPY AND STENT PLACEMENT Left 07/30/2016   Procedure: CYSTOSCOPY WITH LEFT  RETROGRADE PYELOGRAM AND LEFT STENT PLACEMENT;  Surgeon: Malen Gauze, MD;  Location: [facility];  Service: Urology;  Laterality: Left;  . CYSTOSCOPY WITH RETROGRADE PYELOGRAM, URETEROSCOPY AND STENT PLACEMENT Left 08/17/2016   Procedure: CYSTOSCOPY WITH LEFT RETROGRADE  STENT EXCHANGE, URETEROSCOPY AND STONE EXTRACTION, LASER LITHOTRIPSY AND LEFT RETROGRADE PYELOGRAM;  Surgeon: Bjorn Pippin, MD;  Location: [facility];  Service: Urology;  Laterality: Left;  . ESOPHAGOGASTRODUODENOSCOPY  last one 03-26-2009  . KNEE ARTHROSCOPY Right 03/19/2010  . TONSILLECTOMY  child    reports that she quit smoking about 8 years ago. She has a 0.50 pack-year smoking history. She has never used smokeless tobacco. She reports that she drinks alcohol. She reports that  she does not use drugs. family history includes COPD in her maternal grandmother; Crohn's disease in her maternal grandmother; Diabetes in her maternal aunt, maternal uncle, and mother; Heart attack in her father; Thyroid disease in her mother. Allergies  Allergen Reactions  . Remeron [Mirtazapine] Anaphylaxis  . Flagyl [Metronidazole] Nausea And Vomiting  . Tramadol Itching   Current Outpatient Prescriptions on File Prior to Visit  Medication Sig Dispense Refill  . phentermine 37.5 MG capsule Take 1 capsule (37.5 mg total) by mouth every morning. 30 capsule 1   No current facility-administered medications on file prior to visit.    Review of Systems  Constitutional: Negative for other unusual diaphoresis or sweats HENT: Negative for ear discharge or swelling Eyes: Negative for other worsening visual disturbances Respiratory: Negative for stridor or other swelling  Gastrointestinal: Negative for worsening distension or other blood Genitourinary: Negative for retention or other urinary change Musculoskeletal: Negative for other MSK pain or swelling Skin: Negative for color change or other new lesions Neurological: Negative for worsening tremors and other numbness  Psychiatric/Behavioral: Negative for worsening agitation or other fatigue All other system neg per pt    Objective:   Physical Exam BP (!) 144/82   Pulse 78   Ht 5\' 8"  (1.727 m)   Wt 196 lb (88.9 kg)   SpO2 99%   BMI 29.80 kg/m  VS noted, mild ill Constitutional: Pt appears in NAD HENT: Head: NCAT.  Right Ear: External ear normal.  Left Ear: External ear normal.  Left  tm's with severe erythema. Right TM mild erythema only, Max sinus areas mild tender.  Pharynx with mild erythema, no exudate Eyes: . Pupils are equal, round, and reactive to light. Conjunctivae and EOM are normal Nose: without d/c or deformity Neck: Neck supple. Gross normal ROM with bilat tender submandib LA Cardiovascular: Normal rate and regular  rhythm.   Pulmonary/Chest: Effort normal and breath sounds without rales but with few scattered bialt wheezing.  Neurological: Pt is alert. At baseline orientation, motor grossly intact Skin: Skin is warm. No rashes, other new lesions, no LE edema Psychiatric: Pt behavior is normal without agitation  No other exam findings    Assessment & Plan:

## 2017-05-26 NOTE — Patient Instructions (Signed)
You had the steroid shot today  Please take all new medication as prescribed - the antibiotic, cough medicine if needed, prednisone and nasacort  You can also take Mucinex (or it's generic off brand) for congestion, and tylenol as needed for pain.  Please continue all other medications as before, including the Zyrtec D  Please have the pharmacy call with any other refills you may need.  Please keep your appointments with your specialists as you may have planned

## 2017-05-29 NOTE — Assessment & Plan Note (Signed)
Mild to mod, for antibx course,  to f/u any worsening symptoms or concerns 

## 2017-05-29 NOTE — Assessment & Plan Note (Signed)
Mild to mod, for depomedrol IM 80, prednisone asd, cough med prn, to f/u any worsening symptoms or concerns

## 2017-05-29 NOTE — Assessment & Plan Note (Signed)
Mild to mod, for depomedrol IM predpac asd,,  to f/u any worsening symptoms or concerns

## 2017-07-03 IMAGING — MR MR KNEE*R* W/O CM
4 of 6 series · 21 of 40 positions shown · non-contrast
Comparison: Radiographs 10/11/2016

CLINICAL DATA: Injured knee playing softball 2-3 weeks ago.
Popping, locking and giving way. History of prior knee surgery.

EXAM:
MRI OF THE RIGHT KNEE WITHOUT CONTRAST
TECHNIQUE: Multiplanar, multisequence MR imaging of the knee was performed. No
intravenous contrast was administered.

[Series 3: pd_tse_fs_tra · axial · 3.5mm · 0.47mm/px · z∈[-48,+28]mm · 3 of 30 slices shown]
[im 5/30]
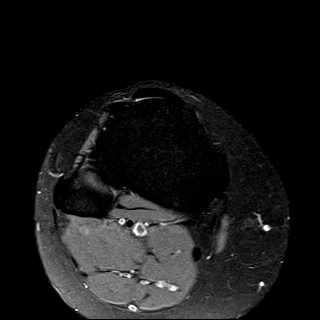
[im 17/30]
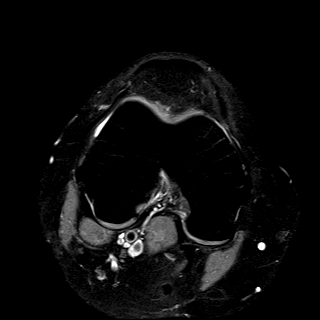
[im 25/30]
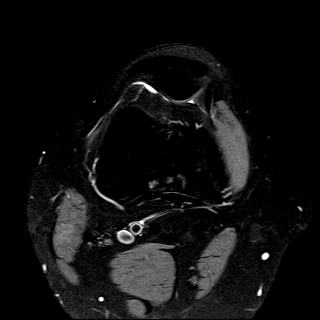

[Series 4: PD fat-sat · sagittal · 4.0mm · 0.31mm/px · 6 of 21 slices shown]
[im 1/21]
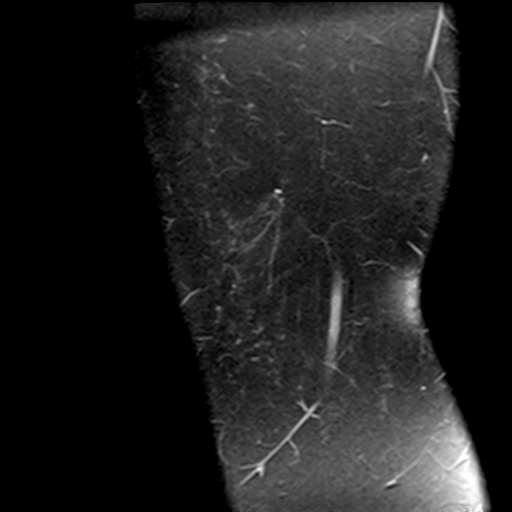
[im 5/21]
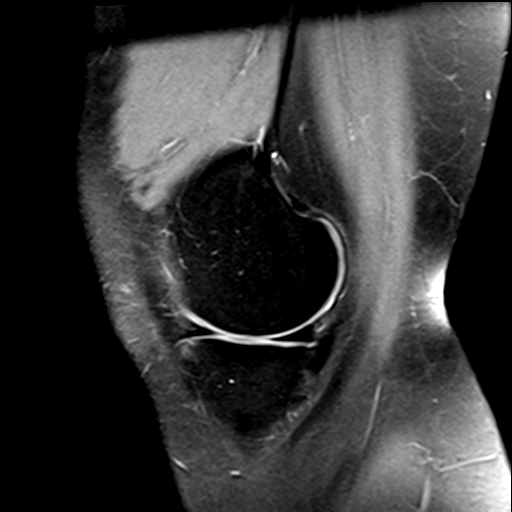
[im 9/21]
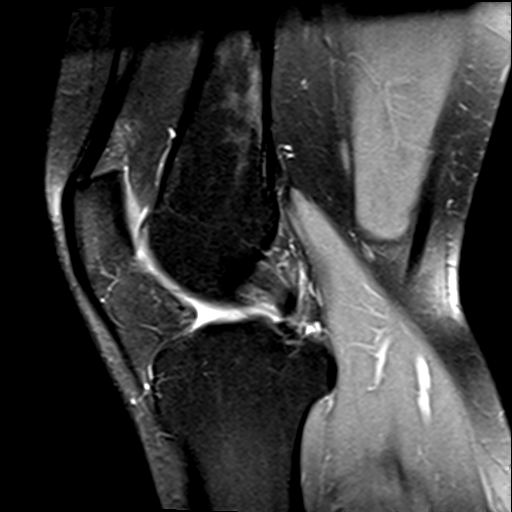
[im 13/21]
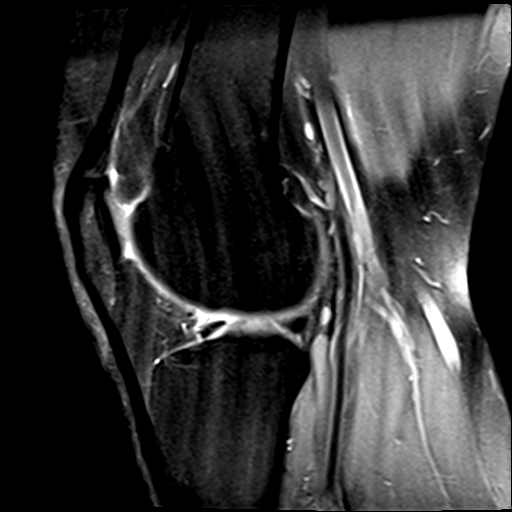
[im 17/21]
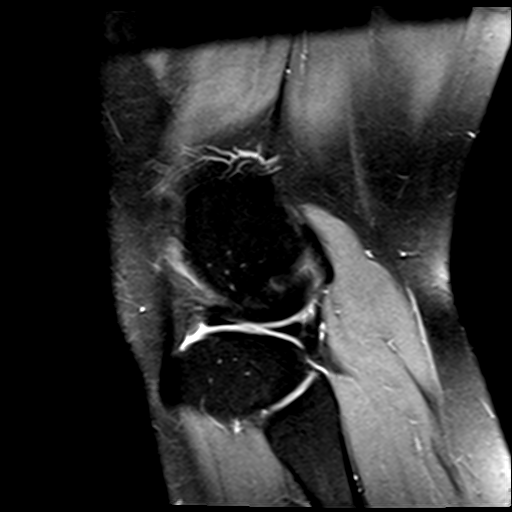
[im 21/21]
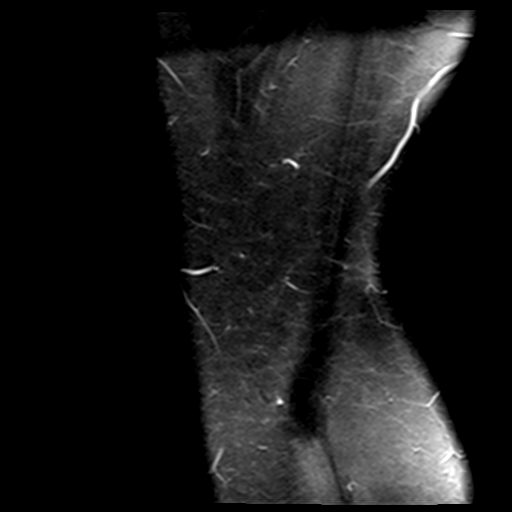

[Series 6: T2 fat-sat · coronal · 3.0mm · 0.29mm/px · 8 of 27 slices shown]
[im 1/27]
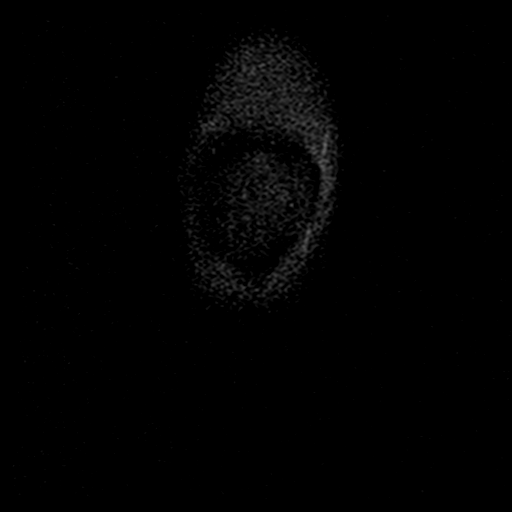
[im 4/27]
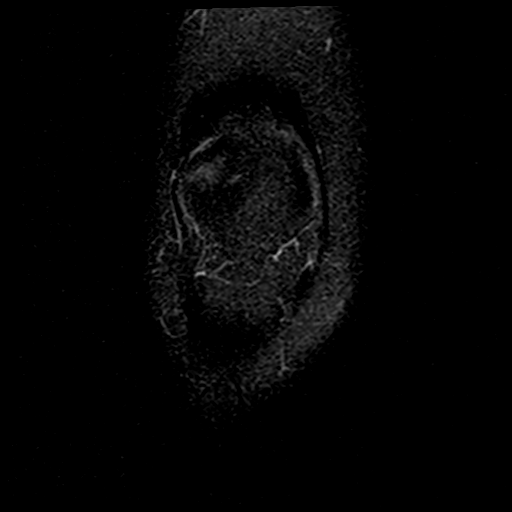
[im 8/27]
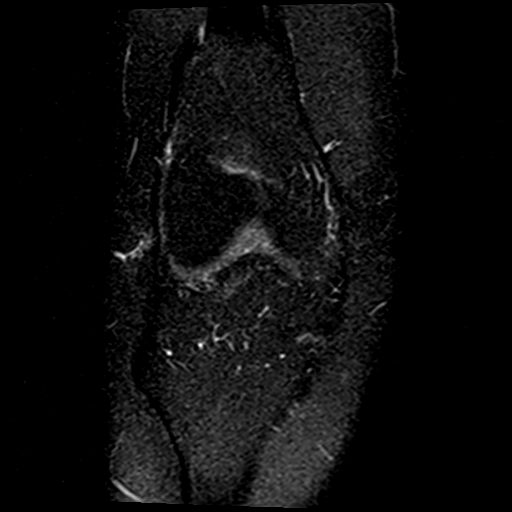
[im 12/27]
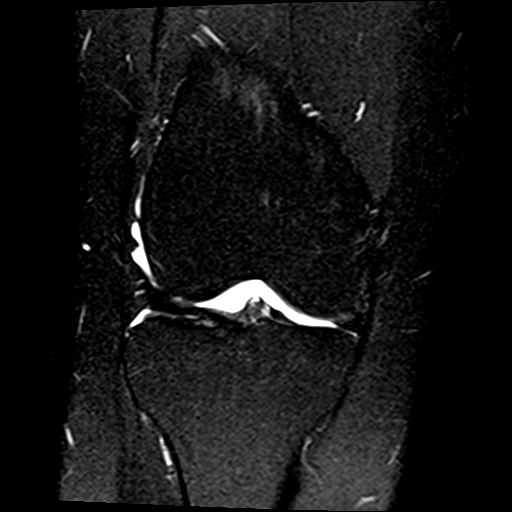
[im 15/27]
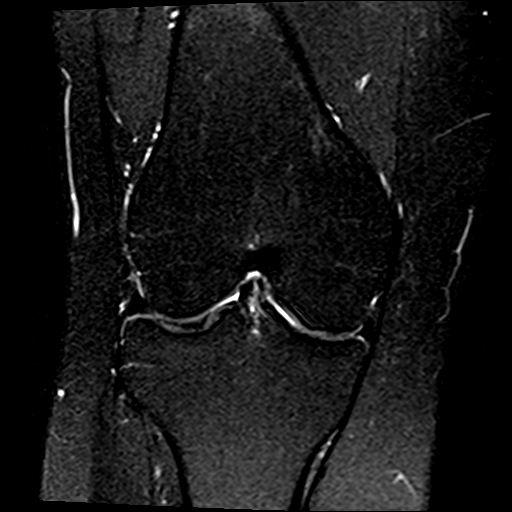
[im 19/27]
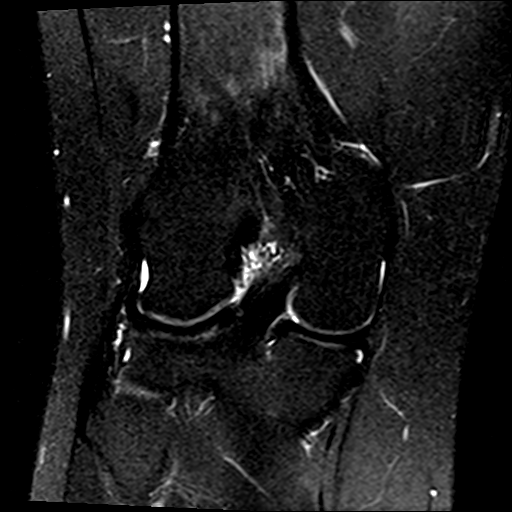
[im 23/27]
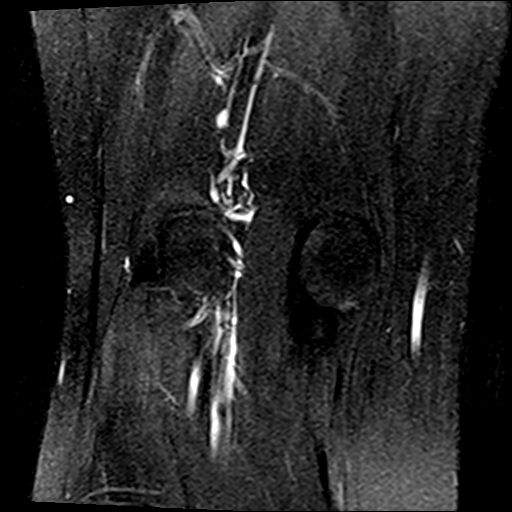
[im 27/27]
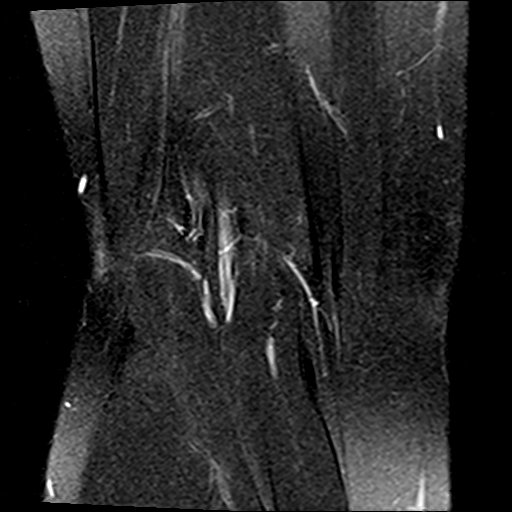

[Series 8: T1 · coronal · 3.0mm · 0.29mm/px · 4 of 27 slices shown]
[im 1/27]
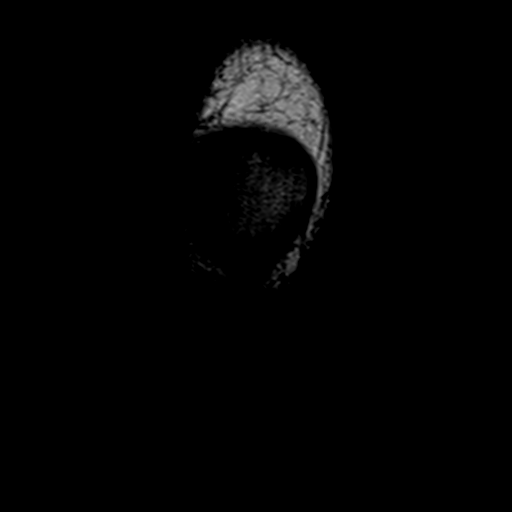
[im 4/27]
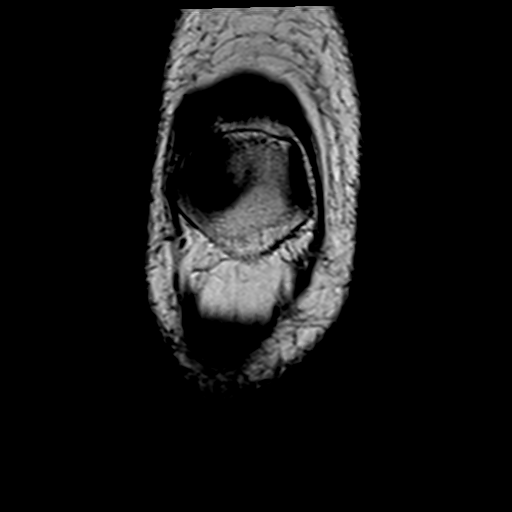
[im 15/27]
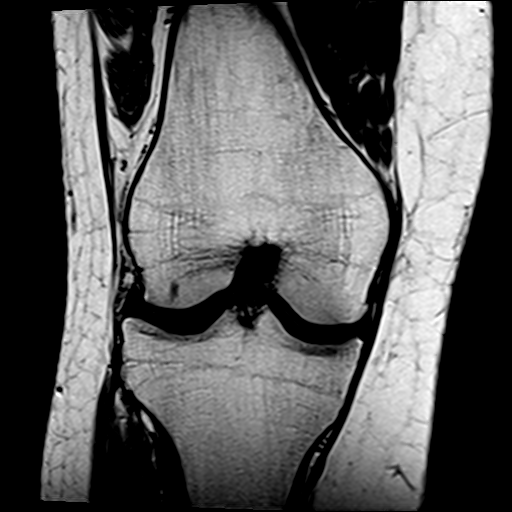
[im 23/27]
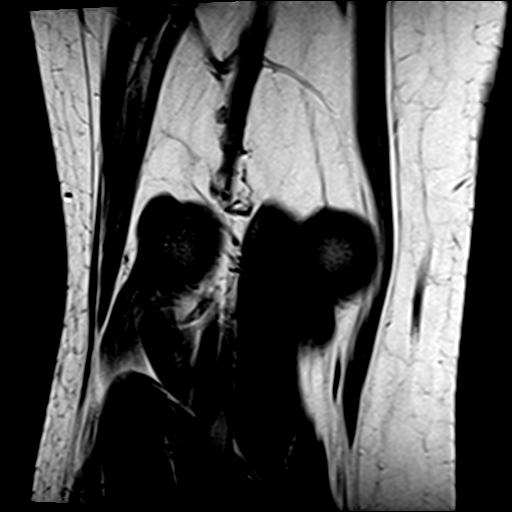

[21 of 40 positions shown; findings below may reference images not displayed]

FINDINGS: MENISCI

Medial meniscus:  Intact.

Lateral meniscus: Small free edge tear involving the anterior horn
mid body junction region.

LIGAMENTS

Cruciates:  Intact.  Mucoid degeneration of the ACL.

Collaterals:  Intact

CARTILAGE

Patellofemoral:  Normal

Medial:  Mild degenerative chondrosis.

Lateral:  Mild degenerative chondrosis.

Joint:  No joint effusion or synovitis.

Popliteal Fossa:  No popliteal mass or Baker's cyst.

Extensor Mechanism: The patella retinacular structures are intact
and the quadriceps and patellar tendons are intact.

Bones:  No acute bony findings.

Other: The knee musculature appears normal.
IMPRESSION: 1. Small free edge tear involving the anterior horn mid body
junction region of the lateral meniscus.
2. Intact ligamentous structures and no acute bony findings. Mucoid
degeneration of the ACL.
3. Mild medial and lateral compartment degenerative
chondrosis/chondromalacia.
4. No joint effusion or Baker's cyst.

## 2017-08-03 ENCOUNTER — Emergency Department (HOSPITAL_COMMUNITY)
Admission: EM | Admit: 2017-08-03 | Discharge: 2017-08-03 | Disposition: A | Discharge status: Home / Self Care | Payer: Medicaid Other | Attending: Emergency Medicine | Admitting: Emergency Medicine

## 2017-08-03 ENCOUNTER — Other Ambulatory Visit (HOSPITAL_COMMUNITY): Payer: Self-pay

## 2017-08-03 ENCOUNTER — Emergency Department (HOSPITAL_COMMUNITY): Payer: Medicaid Other

## 2017-08-03 ENCOUNTER — Encounter (HOSPITAL_COMMUNITY): Payer: Self-pay | Admitting: *Deleted

## 2017-08-03 DIAGNOSIS — Z79899 Other long term (current) drug therapy: Secondary | ICD-10-CM | POA: Diagnosis not present

## 2017-08-03 DIAGNOSIS — Z87891 Personal history of nicotine dependence: Secondary | ICD-10-CM | POA: Insufficient documentation

## 2017-08-03 DIAGNOSIS — R109 Unspecified abdominal pain: Secondary | ICD-10-CM | POA: Diagnosis present

## 2017-08-03 DIAGNOSIS — N200 Calculus of kidney: Secondary | ICD-10-CM | POA: Insufficient documentation

## 2017-08-03 LAB — BASIC METABOLIC PANEL
Anion gap: 8 (ref 5–15)
BUN: 9 mg/dL (ref 6–20)
CALCIUM: 8.9 mg/dL (ref 8.9–10.3)
CHLORIDE: 104 mmol/L (ref 101–111)
CO2: 25 mmol/L (ref 22–32)
CREATININE: 0.83 mg/dL (ref 0.44–1.00)
GFR calc Af Amer: >60 mL/min (ref >60)
GFR calc non Af Amer: >60 mL/min (ref >60)
Glucose, Bld: 96 mg/dL (ref 65–99)
Potassium: 3.7 mmol/L (ref 3.5–5.1)
Sodium: 137 mmol/L (ref 135–145)

## 2017-08-03 LAB — CBC WITH DIFFERENTIAL/PLATELET
Basophils Absolute: 0 10*3/uL (ref 0.0–0.1)
Basophils Relative: 0 %
EOS ABS: 0.3 10*3/uL (ref 0.0–0.7)
Eosinophils Relative: 3 %
HEMATOCRIT: 42.4 % (ref 36.0–46.0)
HEMOGLOBIN: 14.3 g/dL (ref 12.0–15.0)
LYMPHS ABS: 2.4 10*3/uL (ref 0.7–4.0)
Lymphocytes Relative: 25 %
MCH: 31.2 pg (ref 26.0–34.0)
MCHC: 33.7 g/dL (ref 30.0–36.0)
MCV: 92.6 fL (ref 78.0–100.0)
Monocytes Absolute: 0.8 10*3/uL (ref 0.1–1.0)
Monocytes Relative: 8 %
NEUTROS ABS: 6.1 10*3/uL (ref 1.7–7.7)
NEUTROS PCT: 64 %
Platelets: 316 10*3/uL (ref 150–400)
RBC: 4.58 MIL/uL (ref 3.87–5.11)
RDW: 12.8 % (ref 11.5–15.5)
WBC: 9.7 10*3/uL (ref 4.0–10.5)

## 2017-08-03 LAB — URINALYSIS, ROUTINE W REFLEX MICROSCOPIC
BACTERIA UA: NONE SEEN
BILIRUBIN URINE: NEGATIVE
Glucose, UA: NEGATIVE mg/dL
Ketones, ur: NEGATIVE mg/dL
LEUKOCYTES UA: NEGATIVE
Nitrite: NEGATIVE
PROTEIN: NEGATIVE mg/dL
SPECIFIC GRAVITY, URINE: 1.018 (ref 1.005–1.030)
pH: 6 (ref 5.0–8.0)

## 2017-08-03 MED ORDER — TAMSULOSIN HCL 0.4 MG PO CAPS: 0.4000 mg | ORAL_CAPSULE | Freq: Every day | ORAL | 0 refills | Status: DC

## 2017-08-03 MED ORDER — MORPHINE SULFATE (PF) 4 MG/ML IV SOLN
4.0000 mg | Freq: Once | INTRAVENOUS | Status: AC
Start: 2017-08-03 — End: 2017-08-03
  Administered 2017-08-03: 4 mg via INTRAVENOUS
  Filled 2017-08-03: qty 1

## 2017-08-03 MED ORDER — ONDANSETRON HCL 4 MG/2ML IJ SOLN
4.0000 mg | Freq: Once | INTRAMUSCULAR | Status: AC
  Administered 2017-08-03: 4 mg via INTRAVENOUS
  Filled 2017-08-03: qty 2

## 2017-08-03 MED ORDER — HYDROCODONE-ACETAMINOPHEN 5-325 MG PO TABS: 2.0000 | ORAL_TABLET | ORAL | 0 refills | Status: DC | PRN

## 2017-08-03 MED ORDER — PROMETHAZINE HCL 25 MG/ML IJ SOLN
25.0000 mg | Freq: Once | INTRAMUSCULAR | Status: AC
  Administered 2017-08-03: 25 mg via INTRAVENOUS
  Filled 2017-08-03: qty 1

## 2017-08-03 MED ORDER — MORPHINE SULFATE (PF) 4 MG/ML IV SOLN
4.0000 mg | Freq: Once | INTRAVENOUS | Status: AC
  Administered 2017-08-03: 4 mg via INTRAVENOUS
  Filled 2017-08-03: qty 1

## 2017-08-03 MED ORDER — KETOROLAC TROMETHAMINE 30 MG/ML IJ SOLN
15.0000 mg | Freq: Once | INTRAMUSCULAR | Status: AC
  Administered 2017-08-03: 15 mg via INTRAVENOUS
  Filled 2017-08-03: qty 1

## 2017-08-03 MED ORDER — SODIUM CHLORIDE 0.9 % IV BOLUS (SEPSIS)
500.0000 mL | Freq: Once | INTRAVENOUS | Status: AC
  Administered 2017-08-03: 500 mL via INTRAVENOUS

## 2017-08-03 MED ORDER — NAPROXEN 500 MG PO TABS: 500.0000 mg | ORAL_TABLET | Freq: Two times a day (BID) | ORAL | 0 refills | Status: DC

## 2017-08-03 MED ORDER — FENTANYL CITRATE (PF) 100 MCG/2ML IJ SOLN
50.0000 ug | Freq: Once | INTRAMUSCULAR | Status: AC
  Administered 2017-08-03: 50 ug via INTRAVENOUS
  Filled 2017-08-03: qty 2

## 2017-08-03 MED ORDER — ONDANSETRON 4 MG PO TBDP: ORAL_TABLET | ORAL | 0 refills | Status: DC

## 2017-08-03 NOTE — Discharge Instructions (Signed)
Use Naprosyn and Zofran for symptom management. You may pass kidney stones in the next day or 2, take Flomax as prescribed to ease passage. Follow up with urology in the next few days. Return to ED if he developed fever, chills, or unable to make urine, or new or concerning symptoms develop. Follow-up with your primary doctor as well.

## 2017-08-03 NOTE — ED Triage Notes (Signed)
Pt reports hx of kidney stones, was told she had several bilateral on ct scan. Now has bilateral flank pain, more severe on left side.

## 2017-08-03 NOTE — ED Provider Notes (Signed)
[facility]Y DEPT Provider Note   CSN: [id number] Arrival date & time: 08/03/17  0759     History   Chief Complaint Chief Complaint  Patient presents with  . Flank Pain    HPI  Krystal Humphrey is a 31 y.o. Female with a history of previous kidney sounds, who presents with bilateral flank pain which is more severe on the left side. She describes flank pain as sharp and stabbing, this pain started to worsen on Monday. She has tried ibuprofen and Aleve at home with no relief. She reports associated nausea and anorexia, no vomiting or abdominal pain. Patient has a known history of kidney stones a year ago this required surgery, on a CT scan in April she had several bilateral small kidney stones, she does not feel that any of these have passed since that scan and is now having worsening. Patient denies any fevers, chills, dysuria, hematuria. Patient still making urine as usual.       Past Medical History:  Diagnosis Date  . Anxiety   . Depression   . GERD (gastroesophageal reflux disease)   . Hiatal hernia   . History of Clostridium difficile colitis    04/ 2011  . History of duodenal ulcer    2009  . History of esophagitis   . History of panic attacks   . Nephrolithiasis    bilateral per ct 07-27-2016  L > R  (right side nonobstructive)  . Urgency of urination     Patient Active Problem List   Diagnosis Date Noted  . Acute sinus infection 05/26/2017  . Wheezing 05/26/2017  . Right foot pain 05/16/2017  . Seasonal allergic rhinitis due to pollen 05/16/2017  . Bilious vomiting with nausea 04/14/2017  . Acute pain of right knee 11/01/2016  . Nephrolithiasis 07/30/2016  . Kidney stone   . Loose stools 02/06/2016  . Abdominal discomfort 01/19/2016  . Obesity 01/07/2015  . SVD (spontaneous vaginal delivery) 09/24/2011  . DEPRESSIVE TYPE PSYCHOSIS 08/21/2009  . ANEMIA, IRON DEFICIENCY 05/08/2009  . INSECT BITE 05/07/2009  . ULCER-DUODENAL 03/12/2009  . ABSENCE OF  MENSTRUATION 10/02/2008  . EPIGASTRIC PAIN 10/01/2008  . REFLUX ESOPHAGITIS 10/10/2007    Past Surgical History:  Procedure Laterality Date  . CYSTOSCOPY WITH RETROGRADE PYELOGRAM, URETEROSCOPY AND STENT PLACEMENT Left 07/30/2016   Procedure: CYSTOSCOPY WITH LEFT  RETROGRADE PYELOGRAM AND LEFT STENT PLACEMENT;  Surgeon: Malen Gauze, MD;  Location: [facility];  Service: Urology;  Laterality: Left;  . CYSTOSCOPY WITH RETROGRADE PYELOGRAM, URETEROSCOPY AND STENT PLACEMENT Left 08/17/2016   Procedure: CYSTOSCOPY WITH LEFT RETROGRADE  STENT EXCHANGE, URETEROSCOPY AND STONE EXTRACTION, LASER LITHOTRIPSY AND LEFT RETROGRADE PYELOGRAM;  Surgeon: Bjorn Pippin, MD;  Location: [facility];  Service: Urology;  Laterality: Left;  . ESOPHAGOGASTRODUODENOSCOPY  last one 03-26-2009  . KNEE ARTHROSCOPY Right 03/19/2010  . TONSILLECTOMY  child    OB History    Gravida Para Term Preterm AB Living   2 1 1   1 1    SAB TAB Ectopic Multiple Live Births   1       1       Home Medications    Prior to Admission medications   Medication Sig Start Date End Date Taking? Authorizing Provider  phentermine 37.5 MG capsule Take 1 capsule (37.5 mg total) by mouth every morning. 03/14/17   Veryl Speak, FNP  predniSONE (DELTASONE) 10 MG tablet 3 tabs by mouth per day for 3 days,2tabs per day for 3 days,1tab  per day for 3 days 05/26/17   Corwin LevinsJohn, James W, MD  triamcinolone (NASACORT) 55 MCG/ACT AERO nasal inhaler Place 2 sprays into the nose daily. 05/26/17   Corwin LevinsJohn, James W, MD    Family History Family History  Problem Relation Age of Onset  . Diabetes Mother   . Thyroid disease Mother   . Heart attack Father   . Diabetes Maternal Aunt   . Diabetes Maternal Uncle   . COPD Maternal Grandmother   . Crohn's disease Maternal Grandmother   . Anesthesia problems Neg Hx   . Hypotension Neg Hx   . Malignant hyperthermia Neg Hx   . Pseudochol deficiency Neg Hx     Social History Social History    Substance Use Topics  . Smoking status: Former Smoker    Packs/day: 0.25    Years: 2.00    Quit date: 09/20/2008  . Smokeless tobacco: Never Used  . Alcohol use Yes     Comment: occasional     Allergies   Remeron [mirtazapine]; Flagyl [metronidazole]; and Tramadol   Review of Systems Review of Systems  Constitutional: Positive for appetite change. Negative for chills and fever.  HENT: Negative for congestion, ear pain, rhinorrhea and sore throat.   Eyes: Negative for photophobia and visual disturbance.  Respiratory: Negative for cough, chest tightness and shortness of breath.   Cardiovascular: Negative for chest pain, palpitations and leg swelling.  Gastrointestinal: Positive for nausea. Negative for abdominal pain and vomiting.  Genitourinary: Positive for flank pain. Negative for difficulty urinating, dysuria, frequency and hematuria.  Musculoskeletal: Negative for myalgias.  Skin: Negative for pallor and rash.  Neurological: Negative for dizziness, weakness, numbness and headaches.     Physical Exam Updated Vital Signs BP (!) 151/79 (BP Location: Right Arm)   Pulse 80   Temp 98.3 F (36.8 C) (Oral)   Resp 18   SpO2 98%   Physical Exam  Constitutional: She appears well-developed and well-nourished. No distress.  HENT:  Head: Normocephalic and atraumatic.  Mouth/Throat: Oropharynx is clear and moist.  Eyes: Pupils are equal, round, and reactive to light. EOM are normal. Right eye exhibits no discharge. Left eye exhibits no discharge.  Cardiovascular: Normal rate, regular rhythm, normal heart sounds and intact distal pulses.   Pulmonary/Chest: Effort normal and breath sounds normal. No respiratory distress.  Abdominal: Soft. Bowel sounds are normal. She exhibits no mass. There is tenderness. There is no guarding.  Positive CVA tenderness bilaterally, more severe on left side  Musculoskeletal: She exhibits no edema or deformity.  Neurological: She is alert.  Coordination normal.  Speech is clear, able to follow commands Normal strength in upper and lower extremities bilaterally including dorsiflexion and plantar flexion, strong and equal grip strength Moves extremities without ataxia, coordination intact  Skin: Skin is warm and dry. Capillary refill takes less than 2 seconds. She is not diaphoretic.  Psychiatric: She has a normal mood and affect. Her behavior is normal.  Nursing note and vitals reviewed.    ED Treatments / Results  Labs (all labs ordered are listed, but only abnormal results are displayed) Labs Reviewed  URINALYSIS, ROUTINE W REFLEX MICROSCOPIC - Abnormal; Notable for the following:       Result Value   APPearance CLOUDY (*)    Hgb urine dipstick SMALL (*)    Squamous Epithelial / LPF 6-30 (*)    All other components within normal limits  CBC WITH DIFFERENTIAL/PLATELET  BASIC METABOLIC PANEL    EKG  EKG Interpretation  None       Radiology US Renal  Result Date: 08/03/2017 CLINICAL DATA:  Flank pain for 3 days EXAM: RENAL / URINARY TRACT ULTRASOUND COMPLETE COMPARISON:  CT abdomen and pelvis Apr 28, 2017 FINDINGS: Right Kidney: Length: 11.6 cm. Echogenicity and renal cortical thickness are within normal limits. No mass, perinephric fluid, or hydronephrosis visualized. No sonographically demonstrable calculus or ureterectasis. Left Kidney: Length: 11.4 cm. Echogenicity and renal cortical thickness are within normal limits. No mass, perinephric fluid, or hydronephrosis visualized. No sonographically demonstrable calculus or ureterectasis. Bladder: Appears normal for degree of bladder distention. IMPRESSION: Study within normal limits. Electronically Signed   By: Bretta Bang III M.D.   On: 08/03/2017 13:45    Procedures Procedures (including critical care time)  Medications Ordered in ED Medications  morphine 4 MG/ML injection 4 mg (4 mg Intravenous Given 08/03/17 0857)  ondansetron (ZOFRAN) injection 4 mg (4 mg  Intravenous Given 08/03/17 0857)  ketorolac (TORADOL) 30 MG/ML injection 15 mg (15 mg Intravenous Given 08/03/17 1001)  sodium chloride 0.9 % bolus 500 mL (0 mLs Intravenous Stopped 08/03/17 1418)  ondansetron (ZOFRAN) injection 4 mg (4 mg Intravenous Given 08/03/17 1159)  morphine 4 MG/ML injection 4 mg (4 mg Intravenous Given 08/03/17 1159)  fentaNYL (SUBLIMAZE) injection 50 mcg (50 mcg Intravenous Given 08/03/17 1505)  promethazine (PHENERGAN) injection 25 mg (25 mg Intravenous Given 08/03/17 1505)     Initial Impression / Assessment and Plan / ED Course  I have reviewed the triage vital signs and the nursing notes.  Pertinent labs & imaging results that were available during my care of the patient were reviewed by me and considered in my medical decision making (see chart for details).  Patient presents with flank pain and known kidney stones, afebrile, vitals stable. Patient had a CT scan a few months ago with multiple punctate stones evident, labs showed good kidney function no evidence of infection. Renal ultrasound shows no evidence of hydronephrosis. Patient likely passed stones in the past 24 hrs and has residual pain. Pain and nausea improved with medication. Patient able to tolerate PO. Will discharge home with a few Vicodin, Zofran, Flomax and Patient has follow-up with urology tomorrow. Return precautions provided, patient expressed understanding and is in agreement with plan.   Patient discussed with Dr. Fayrene Fearing, who agrees with plan.   Final Clinical Impressions(s) / ED Diagnoses   Final diagnoses:  Flank pain  Nephrolithiasis    New Prescriptions Discharge Medication List as of 08/03/2017  3:49 PM    START taking these medications   Details  HYDROcodone-acetaminophen (NORCO) 5-325 MG tablet Take 2 tablets by mouth every 4 (four) hours as needed., Starting Wed 08/03/2017, Print    naproxen (NAPROSYN) 500 MG tablet Take 1 tablet (500 mg total) by mouth 2 (two) times daily., Starting Wed  08/03/2017, Print    ondansetron (ZOFRAN ODT) 4 MG disintegrating tablet 4mg  ODT q4 hours prn nausea/vomit, Print    tamsulosin (FLOMAX) 0.4 MG CAPS capsule Take 1 capsule (0.4 mg total) by mouth daily., Starting Wed 08/03/2017, Print               Dartha Lodge, PA-C 08/04/17 1223    Rolland Porter, MD 08/14/17 [phone]

## 2017-10-04 ENCOUNTER — Emergency Department (HOSPITAL_COMMUNITY)
Admission: EM | Admit: 2017-10-04 | Discharge: 2017-10-04 | Disposition: A | Discharge status: Home / Self Care | Payer: Medicaid Other | Attending: Emergency Medicine | Admitting: Emergency Medicine

## 2017-10-04 ENCOUNTER — Emergency Department (HOSPITAL_COMMUNITY): Payer: Medicaid Other

## 2017-10-04 ENCOUNTER — Encounter (HOSPITAL_COMMUNITY): Payer: Self-pay | Admitting: Emergency Medicine

## 2017-10-04 DIAGNOSIS — M545 Low back pain, unspecified: Secondary | ICD-10-CM

## 2017-10-04 DIAGNOSIS — Z87891 Personal history of nicotine dependence: Secondary | ICD-10-CM | POA: Diagnosis not present

## 2017-10-04 DIAGNOSIS — Z79899 Other long term (current) drug therapy: Secondary | ICD-10-CM | POA: Diagnosis not present

## 2017-10-04 MED ORDER — NAPROXEN 500 MG PO TABS: 500.0000 mg | ORAL_TABLET | Freq: Two times a day (BID) | ORAL | 0 refills | Status: DC

## 2017-10-04 MED ORDER — KETOROLAC TROMETHAMINE 30 MG/ML IJ SOLN
30.0000 mg | Freq: Once | INTRAMUSCULAR | Status: AC
  Administered 2017-10-04: 30 mg via INTRAMUSCULAR
  Filled 2017-10-04: qty 1

## 2017-10-04 MED ORDER — OXYCODONE-ACETAMINOPHEN 5-325 MG PO TABS: 1.0000 | ORAL_TABLET | Freq: Three times a day (TID) | ORAL | 0 refills | Status: DC | PRN

## 2017-10-04 MED ORDER — CYCLOBENZAPRINE HCL 10 MG PO TABS: 10.0000 mg | ORAL_TABLET | Freq: Two times a day (BID) | ORAL | 0 refills | Status: DC | PRN

## 2017-10-04 NOTE — ED Notes (Signed)
Pt called out stating her pain is not changed and she "would like something else".

## 2017-10-04 NOTE — ED Notes (Signed)
See pa note for assessment  

## 2017-10-04 NOTE — ED Triage Notes (Signed)
Pt. Stated, I hurt my back about a month ago and its worse yesterday and today.
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
## 2017-10-04 NOTE — ED Notes (Signed)
Patient transported to X-ray 

## 2017-10-04 NOTE — ED Notes (Signed)
PT states understanding of care given, follow up care, and medication prescribed. PT ambulated from ED to car with a steady gait. 

## 2017-11-18 ENCOUNTER — Ambulatory Visit: Payer: Medicaid Other | Admitting: Neurology

## 2017-11-25 ENCOUNTER — Encounter: Payer: Self-pay | Admitting: Neurology

## 2018-01-15 ENCOUNTER — Encounter (HOSPITAL_COMMUNITY): Payer: Self-pay | Admitting: Emergency Medicine

## 2018-01-15 ENCOUNTER — Emergency Department (HOSPITAL_COMMUNITY)
Admission: EM | Admit: 2018-01-15 | Discharge: 2018-01-15 | Disposition: A | Discharge status: Home / Self Care | Payer: Medicaid Other | Attending: Emergency Medicine | Admitting: Emergency Medicine

## 2018-01-15 ENCOUNTER — Other Ambulatory Visit: Payer: Self-pay

## 2018-01-15 ENCOUNTER — Emergency Department (HOSPITAL_COMMUNITY): Payer: Medicaid Other

## 2018-01-15 DIAGNOSIS — S99921A Unspecified injury of right foot, initial encounter: Secondary | ICD-10-CM

## 2018-01-15 DIAGNOSIS — M79671 Pain in right foot: Secondary | ICD-10-CM | POA: Insufficient documentation

## 2018-01-15 DIAGNOSIS — Z79899 Other long term (current) drug therapy: Secondary | ICD-10-CM | POA: Diagnosis not present

## 2018-01-15 DIAGNOSIS — Z87891 Personal history of nicotine dependence: Secondary | ICD-10-CM | POA: Diagnosis not present
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
## 2018-01-15 NOTE — Discharge Instructions (Signed)
Rest - please stay off foot as much as possible for the next several days Ice - ice for 20 minutes at a time, several times a day Elevate - elevate right leg above level of heart Ibuprofen - take with food. Take up to 3-4 times daily
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
## 2018-02-08 ENCOUNTER — Other Ambulatory Visit: Payer: Self-pay

## 2018-02-08 ENCOUNTER — Emergency Department (HOSPITAL_COMMUNITY)
Admission: EM | Admit: 2018-02-08 | Discharge: 2018-02-08 | Discharge status: Against Medical Advice | Payer: Medicaid Other

## 2018-02-11 ENCOUNTER — Other Ambulatory Visit: Payer: Self-pay

## 2018-02-11 ENCOUNTER — Emergency Department (HOSPITAL_COMMUNITY): Payer: Medicaid Other

## 2018-02-11 ENCOUNTER — Emergency Department (HOSPITAL_COMMUNITY)
Admission: EM | Admit: 2018-02-11 | Discharge: 2018-02-12 | Disposition: A | Discharge status: Home / Self Care | Payer: Medicaid Other | Attending: Emergency Medicine | Admitting: Emergency Medicine

## 2018-02-11 ENCOUNTER — Encounter (HOSPITAL_COMMUNITY): Payer: Self-pay | Admitting: Emergency Medicine

## 2018-02-11 DIAGNOSIS — Y999 Unspecified external cause status: Secondary | ICD-10-CM | POA: Insufficient documentation

## 2018-02-11 DIAGNOSIS — Y939 Activity, unspecified: Secondary | ICD-10-CM | POA: Insufficient documentation

## 2018-02-11 DIAGNOSIS — R0789 Other chest pain: Secondary | ICD-10-CM | POA: Insufficient documentation

## 2018-02-11 DIAGNOSIS — Z79899 Other long term (current) drug therapy: Secondary | ICD-10-CM | POA: Diagnosis not present

## 2018-02-11 DIAGNOSIS — Y929 Unspecified place or not applicable: Secondary | ICD-10-CM | POA: Diagnosis not present

## 2018-02-11 DIAGNOSIS — R51 Headache: Secondary | ICD-10-CM | POA: Diagnosis not present

## 2018-02-11 DIAGNOSIS — Z789 Other specified health status: Secondary | ICD-10-CM

## 2018-02-11 DIAGNOSIS — W19XXXA Unspecified fall, initial encounter: Secondary | ICD-10-CM

## 2018-02-11 DIAGNOSIS — M542 Cervicalgia: Secondary | ICD-10-CM | POA: Insufficient documentation

## 2018-02-11 DIAGNOSIS — Z87891 Personal history of nicotine dependence: Secondary | ICD-10-CM | POA: Insufficient documentation

## 2018-02-11 DIAGNOSIS — T1490XA Injury, unspecified, initial encounter: Secondary | ICD-10-CM

## 2018-02-11 LAB — I-STAT BETA HCG BLOOD, ED (MC, WL, AP ONLY)

## 2018-02-11 LAB — I-STAT CHEM 8, ED
BUN: 13 mg/dL (ref 6–20)
CALCIUM ION: 1.18 mmol/L (ref 1.15–1.40)
Chloride: 106 mmol/L (ref 101–111)
Creatinine, Ser: 0.8 mg/dL (ref 0.44–1.00)
GLUCOSE: 106 mg/dL — AB (ref 65–99)
HCT: 38 % (ref 36.0–46.0)
HEMOGLOBIN: 12.9 g/dL (ref 12.0–15.0)
POTASSIUM: 3.7 mmol/L (ref 3.5–5.1)
SODIUM: 141 mmol/L (ref 135–145)
TCO2: 24 mmol/L (ref 22–32)

## 2018-02-11 MED ORDER — ONDANSETRON 4 MG PO TBDP
4.0000 mg | ORAL_TABLET | Freq: Once | ORAL | Status: AC
  Administered 2018-02-12: 4 mg via ORAL
  Filled 2018-02-11: qty 1

## 2018-02-11 MED ORDER — IOPAMIDOL (ISOVUE-300) INJECTION 61%
INTRAVENOUS | Status: AC
  Administered 2018-02-11: 75 mL
  Filled 2018-02-11: qty 75

## 2018-02-11 MED ORDER — OXYCODONE-ACETAMINOPHEN 5-325 MG PO TABS
1.0000 | ORAL_TABLET | Freq: Once | ORAL | Status: AC
  Administered 2018-02-12: 1 via ORAL
  Filled 2018-02-11: qty 1

## 2018-02-11 MED ORDER — FENTANYL CITRATE (PF) 100 MCG/2ML IJ SOLN
50.0000 ug | Freq: Once | INTRAMUSCULAR | Status: AC
  Administered 2018-02-11: 50 ug via INTRAVENOUS
  Filled 2018-02-11: qty 2

## 2018-02-11 NOTE — ED Triage Notes (Signed)
Patient presents to ED for assessment of neck pain, back pain, head pain and aching after a fall from on top of a horse forward.  Patient landed on her back.  Placed in c-collar in triage.

## 2018-02-11 NOTE — ED Notes (Addendum)
Pt returned from radiology via stretcher, family at bedside, pt in [employer]

## 2018-02-11 NOTE — ED Notes (Signed)
ED Provider at bedside. 

## 2018-02-11 NOTE — ED Notes (Signed)
Patient transported to CT 

## 2018-02-11 NOTE — ED Notes (Addendum)
Patient transported to X-ray 

## 2018-02-12 MED ORDER — KETOROLAC TROMETHAMINE 30 MG/ML IJ SOLN
30.0000 mg | Freq: Once | INTRAMUSCULAR | Status: AC
  Administered 2018-02-12: 30 mg via INTRAVENOUS
  Filled 2018-02-12: qty 1

## 2018-02-12 MED ORDER — CYCLOBENZAPRINE HCL 10 MG PO TABS: 10.0000 mg | ORAL_TABLET | Freq: Two times a day (BID) | ORAL | 0 refills | Status: DC | PRN

## 2018-02-12 MED ORDER — HYDROCODONE-ACETAMINOPHEN 5-325 MG PO TABS: 1.0000 | ORAL_TABLET | ORAL | 0 refills | Status: DC | PRN

## 2018-02-12 NOTE — ED Provider Notes (Signed)
[facility] EMERGENCY DEPARTMENT Provider Note   CSN: [id number] Arrival date & time: 02/11/18  1949     History   Chief Complaint Chief Complaint  Patient presents with  . Fall    HPI Krystal Humphrey is a 32 y.o. female.  HPI   Patient is a 32 year old female with a history of anxiety, depression, GERD, esophagitis presenting for fall from her horse.  Patient was not wearing a helmet at the time of the fall.  Patient reports that her horse on a different direction and she was falling from the horse and fell onto her back and struck the back of her head.  Patient does not believe she lost consciousness, however reports she had "blurred vision" at the time, and her partner with her noticed that she may have lost consciousness.  Patient reports he subsequently had nausea without vomiting.  Patient had no further visual changes, but did feel "dizzy".  Patient does report some confusion around the events of the incident.  Patient reporting greatest pain at the back of her neck and midline thorax.  Patient denies chest pain or difficulty breathing.  Patient denies extremity weakness or loss of sensation.  Patient denies any saddle anesthesia, loss of bowel or bladder control, weakness or numbness in the lower extremities.  Patient denies any open wounds that occurred during this fall.  Past Medical History:  Diagnosis Date  . Anxiety   . Depression   . GERD (gastroesophageal reflux disease)   . Hiatal hernia   . History of Clostridium difficile colitis    04/ 2011  . History of duodenal ulcer    2009  . History of esophagitis   . History of panic attacks   . Nephrolithiasis    bilateral per ct 07-27-2016  L > R  (right side nonobstructive)  . Urgency of urination     Patient Active Problem List   Diagnosis Date Noted  . Acute sinus infection 05/26/2017  . Wheezing 05/26/2017  . Right foot pain 05/16/2017  . Seasonal allergic rhinitis due to pollen 05/16/2017  .  Bilious vomiting with nausea 04/14/2017  . Acute pain of right knee 11/01/2016  . Nephrolithiasis 07/30/2016  . Kidney stone   . Loose stools 02/06/2016  . Abdominal discomfort 01/19/2016  . Obesity 01/07/2015  . SVD (spontaneous vaginal delivery) 09/24/2011  . DEPRESSIVE TYPE PSYCHOSIS 08/21/2009  . ANEMIA, IRON DEFICIENCY 05/08/2009  . INSECT BITE 05/07/2009  . ULCER-DUODENAL 03/12/2009  . ABSENCE OF MENSTRUATION 10/02/2008  . EPIGASTRIC PAIN 10/01/2008  . REFLUX ESOPHAGITIS 10/10/2007    Past Surgical History:  Procedure Laterality Date  . CYSTOSCOPY WITH RETROGRADE PYELOGRAM, URETEROSCOPY AND STENT PLACEMENT Left 07/30/2016   Procedure: CYSTOSCOPY WITH LEFT  RETROGRADE PYELOGRAM AND LEFT STENT PLACEMENT;  Surgeon: Malen Gauze, MD;  Location: [facility];  Service: Urology;  Laterality: Left;  . CYSTOSCOPY WITH RETROGRADE PYELOGRAM, URETEROSCOPY AND STENT PLACEMENT Left 08/17/2016   Procedure: CYSTOSCOPY WITH LEFT RETROGRADE  STENT EXCHANGE, URETEROSCOPY AND STONE EXTRACTION, LASER LITHOTRIPSY AND LEFT RETROGRADE PYELOGRAM;  Surgeon: Bjorn Pippin, MD;  Location: [facility];  Service: Urology;  Laterality: Left;  . ESOPHAGOGASTRODUODENOSCOPY  last one 03-26-2009  . KNEE ARTHROSCOPY Right 03/19/2010  . TONSILLECTOMY  child    OB History    Gravida Para Term Preterm AB Living   2 1 1   1 1    SAB TAB Ectopic Multiple Live Births   1       1  Home Medications    Prior to Admission medications   Medication Sig Start Date End Date Taking? Authorizing Provider  albuterol (PROAIR HFA) 108 (90 Base) MCG/ACT inhaler ProAir HFA 90 mcg/actuation aerosol inhaler  INHALE 2 PUFFS PO Q 4 TO 6 H PRF COUGH AND SOB    [provider]  cyclobenzaprine (FLEXERIL) 10 MG tablet Take 1 tablet (10 mg total) 2 (two) times daily as needed by mouth for muscle spasms. 10/04/17   Felicie MornSmith, David, NP  HYDROcodone-acetaminophen (NORCO) 5-325 MG tablet Take 2 tablets by mouth  every 4 (four) hours as needed. 08/03/17   Dartha LodgeFord, Kelsey N, PA-C  naproxen (NAPROSYN) 500 MG tablet Take 1 tablet (500 mg total) by mouth 2 (two) times daily. 08/03/17   Dartha LodgeFord, Kelsey N, PA-C  naproxen (NAPROSYN) 500 MG tablet Take 1 tablet (500 mg total) 2 (two) times daily by mouth. 10/04/17   Felicie MornSmith, David, NP  ondansetron (ZOFRAN ODT) 4 MG disintegrating tablet 4mg  ODT q4 hours prn nausea/vomit 08/03/17   Dartha LodgeFord, Kelsey N, PA-C  oxyCODONE-acetaminophen (PERCOCET/ROXICET) 5-325 MG tablet Take 1 tablet every 8 (eight) hours as needed by mouth for severe pain. 10/04/17   Felicie MornSmith, David, NP  phentermine 37.5 MG capsule Take 1 capsule (37.5 mg total) by mouth every morning. 03/14/17   Veryl Speakalone, Gregory D, FNP  tamsulosin (FLOMAX) 0.4 MG CAPS capsule Take 1 capsule (0.4 mg total) by mouth daily. 08/03/17   Dartha LodgeFord, Kelsey N, PA-C  triamcinolone (NASACORT) 55 MCG/ACT AERO nasal inhaler Place 2 sprays into the nose daily. Patient taking differently: Place 2 sprays into the nose daily as needed. Allergies 05/26/17   Corwin LevinsJohn, James W, MD    Family History Family History  Problem Relation Age of Onset  . Diabetes Mother   . Thyroid disease Mother   . Heart attack Father   . Diabetes Maternal Aunt   . Diabetes Maternal Uncle   . COPD Maternal Grandmother   . Crohn's disease Maternal Grandmother   . Anesthesia problems Neg Hx   . Hypotension Neg Hx   . Malignant hyperthermia Neg Hx   . Pseudochol deficiency Neg Hx     Social History Social History   Tobacco Use  . Smoking status: Former Smoker    Packs/day: 0.25    Years: 2.00    Pack years: 0.50    Last attempt to quit: 09/20/2008    Years since quitting: 9.4  . Smokeless tobacco: Never Used  Substance Use Topics  . Alcohol use: Yes    Comment: occasional  . Drug use: No     Allergies   Remeron [mirtazapine]; Flagyl [metronidazole]; and Tramadol   Review of Systems Review of Systems  HENT: Negative for ear discharge and rhinorrhea.   Eyes: Negative  for visual disturbance.  Respiratory: Negative for chest tightness and shortness of breath.   Cardiovascular: Negative for chest pain.  Gastrointestinal: Positive for nausea. Negative for abdominal pain and vomiting.  Genitourinary: Negative for flank pain.  Musculoskeletal: Positive for back pain and myalgias. Negative for gait problem.  Skin: Negative for wound.  Neurological: Positive for dizziness and headaches. Negative for speech difficulty, weakness, light-headedness and numbness.  Psychiatric/Behavioral: Negative for confusion.  All other systems reviewed and are negative.    Physical Exam Updated Vital Signs BP 135/87 (BP Location: Left Arm)   Pulse 72   Temp 98.4 F (36.9 C) (Oral)   Resp 18   SpO2 100%   Physical Exam  Constitutional: She appears well-developed and  well-nourished. No distress.  HENT:  Head: Normocephalic and atraumatic.  Mouth/Throat: Oropharynx is clear and moist.  Eyes: Conjunctivae and EOM are normal. Pupils are equal, round, and reactive to light.  Neck: Normal range of motion. Neck supple.  No midline neck tenderness.  Diffuse lower cervical paraspinal muscular tenderness on right.  Cardiovascular: Normal rate, regular rhythm, S1 normal and S2 normal.  No murmur heard. Pulmonary/Chest: Effort normal and breath sounds normal. She has no wheezes. She has no rales.  No tachypnea.  Equal lung expansion bilaterally.  Abdominal: Soft. She exhibits no distension. There is no tenderness. There is no guarding.  Musculoskeletal: Normal range of motion. She exhibits no edema or deformity.  Midline thoracic spine tenderness at the lower thoracic spine.  No lumbar midline or paraspinal muscular tenderness.  Neurological: She is alert.  Mental Status:  Alert, oriented, thought content appropriate, able to give a coherent history. Speech fluent without evidence of aphasia. Able to follow 2 step commands without difficulty.  Cranial Nerves:  II:  Peripheral  visual fields grossly normal, pupils equal, round, reactive to light III,IV, VI: ptosis not present, extra-ocular motions intact bilaterally  V,VII: smile symmetric, facial light touch sensation equal VIII: hearing grossly normal to voice  X: uvula elevates symmetrically  XI: bilateral shoulder shrug symmetric and strong XII: midline tongue extension without fassiculations Motor:  Normal tone. 5/5 in upper and lower extremities bilaterally including strong and equal grip strength and dorsiflexion/plantar flexion Sensory: Pinprick and light touch normal in all extremities.  Deep Tendon Reflexes: 1+ and symmetric in the triceps, biceps, and brachioradials, and 2+ in patella and plantar reflexes.  Cerebellar: normal finger-to-nose with bilateral upper extremities Gait: normal gait and balance Stance: No pronator drift and good coordination, strength, and position sense with tapping of bilateral arms (performed in sitting position). CV: distal pulses palpable throughout   Skin: Skin is warm and dry. No rash noted. No erythema.  Psychiatric: She has a normal mood and affect. Her behavior is normal. Judgment and thought content normal.  Nursing note and vitals reviewed.    ED Treatments / Results  Labs (all labs ordered are listed, but only abnormal results are displayed) Labs Reviewed  I-STAT CHEM 8, ED - Abnormal; Notable for the following components:      Result Value   Glucose, Bld 106 (*)    All other components within normal limits  I-STAT BETA HCG BLOOD, ED ([facility], [facility], [facility] ONLY)    EKG  EKG Interpretation None       Radiology Dg Chest 2 View  Result Date: 02/11/2018 CLINICAL DATA:  Trauma, fall from horse EXAM: CHEST - 2 VIEW COMPARISON:  01/18/2017 chest radiograph. FINDINGS: Stable cardiomediastinal silhouette with normal heart size. No pneumothorax. No pleural effusion. Lungs appear clear, with no acute consolidative airspace disease and no pulmonary edema. No displaced  fractures in the visualized chest. IMPRESSION: No active cardiopulmonary disease. Electronically Signed   By: Delbert Phenix M.D.   On: 02/11/2018 22:10   Ct Head Wo Contrast  Result Date: 02/11/2018 CLINICAL DATA:  Fall from horse EXAM: CT HEAD WITHOUT CONTRAST CT CERVICAL SPINE WITHOUT CONTRAST TECHNIQUE: Multidetector CT imaging of the head and cervical spine was performed following the standard protocol without intravenous contrast. Multiplanar CT image reconstructions of the cervical spine were also generated. COMPARISON:  Head CT 11/17/2015 FINDINGS: CT HEAD FINDINGS Brain: No mass lesion, intraparenchymal hemorrhage or extra-axial collection. No evidence of acute cortical infarct. Brain parenchyma and CSF-containing spaces  are normal for age. Vascular: No hyperdense vessel or unexpected calcification. Skull: Normal visualized skull base, calvarium and extracranial soft tissues. Sinuses/Orbits: No sinus fluid levels or advanced mucosal thickening. No mastoid effusion. Normal orbits. CT CERVICAL SPINE FINDINGS Alignment: No static subluxation. Facets are aligned. Occipital condyles are normally positioned. Skull base and vertebrae: No acute fracture. Soft tissues and spinal canal: No prevertebral fluid or swelling. No visible canal hematoma. Disc levels: No advanced spinal canal or neural foraminal stenosis. Upper chest: No pneumothorax, pulmonary nodule or pleural effusion. Other: Normal visualized paraspinal cervical soft tissues. IMPRESSION: Normal head and cervical spine. Electronically Signed   By: Deatra Robinson M.D.   On: 02/11/2018 23:41   Ct Chest W Contrast  Result Date: 02/12/2018 CLINICAL DATA:  Fall from horse.  Right chest wall pain. EXAM: CT CHEST WITH CONTRAST TECHNIQUE: Multidetector CT imaging of the chest was performed during intravenous contrast administration. CONTRAST:  75 cc ISOVUE-300 IOPAMIDOL (ISOVUE-300) INJECTION 61% COMPARISON:  Chest radiograph from earlier today. 03/22/2010  chest CT angiogram. FINDINGS: Cardiovascular: Normal heart size. No significant pericardial fluid/thickening. Great vessels are normal in course and caliber. No evidence of acute thoracic aortic injury. No central pulmonary emboli. Mediastinum/Nodes: No pneumomediastinum. No mediastinal hematoma. No discrete thyroid nodules. Unremarkable esophagus. No axillary, mediastinal or hilar lymphadenopathy. Lungs/Pleura: No pneumothorax. No pleural effusion. Solid subpleural 4 mm left lower lobe pulmonary nodule (series 7/image 112), stable since 03/22/2010 and considered benign. No acute consolidative airspace disease, lung masses or new significant pulmonary nodules. No pneumatoceles. Upper Abdomen: No acute abnormality. Musculoskeletal: No aggressive appearing focal osseous lesions. No fracture detected in the chest. IMPRESSION: No acute traumatic injury in the chest. Electronically Signed   By: Delbert Phenix M.D.   On: 02/12/2018 00:05   Ct Cervical Spine Wo Contrast  Result Date: 02/11/2018 CLINICAL DATA:  Fall from horse EXAM: CT HEAD WITHOUT CONTRAST CT CERVICAL SPINE WITHOUT CONTRAST TECHNIQUE: Multidetector CT imaging of the head and cervical spine was performed following the standard protocol without intravenous contrast. Multiplanar CT image reconstructions of the cervical spine were also generated. COMPARISON:  Head CT 11/17/2015 FINDINGS: CT HEAD FINDINGS Brain: No mass lesion, intraparenchymal hemorrhage or extra-axial collection. No evidence of acute cortical infarct. Brain parenchyma and CSF-containing spaces are normal for age. Vascular: No hyperdense vessel or unexpected calcification. Skull: Normal visualized skull base, calvarium and extracranial soft tissues. Sinuses/Orbits: No sinus fluid levels or advanced mucosal thickening. No mastoid effusion. Normal orbits. CT CERVICAL SPINE FINDINGS Alignment: No static subluxation. Facets are aligned. Occipital condyles are normally positioned. Skull base and  vertebrae: No acute fracture. Soft tissues and spinal canal: No prevertebral fluid or swelling. No visible canal hematoma. Disc levels: No advanced spinal canal or neural foraminal stenosis. Upper chest: No pneumothorax, pulmonary nodule or pleural effusion. Other: Normal visualized paraspinal cervical soft tissues. IMPRESSION: Normal head and cervical spine. Electronically Signed   By: Deatra Robinson M.D.   On: 02/11/2018 23:41   Ct T-spine No Charge  Result Date: 02/11/2018 CLINICAL DATA:  Fall from horse EXAM: CT THORACIC SPINE WITHOUT CONTRAST TECHNIQUE: Multidetector CT images of the thoracic were obtained using the standard protocol without intravenous contrast. COMPARISON:  CT abdomen pelvis 04/28/2017 FINDINGS: Alignment: Normal. Vertebrae: Superior endplate Schmorl's node at T11 is unchanged and chronic. No acute fracture. Paraspinal and other soft tissues: Negative. Disc levels: No spinal canal stenosis. IMPRESSION: No acute fracture or static subluxation of the thoracic spine. Electronically Signed   By: Chrisandra Netters.D.  On: 02/11/2018 23:35    Procedures Procedures (including critical care time)  Medications Ordered in ED Medications  fentaNYL (SUBLIMAZE) injection 50 mcg (50 mcg Intravenous Given 02/11/18 2134)  iopamidol (ISOVUE-300) 61 % injection (75 mLs  Contrast Given 02/11/18 2236)  ondansetron (ZOFRAN-ODT) disintegrating tablet 4 mg (4 mg Oral Given 02/12/18 0008)  oxyCODONE-acetaminophen (PERCOCET/ROXICET) 5-325 MG per tablet 1 tablet (1 tablet Oral Given 02/12/18 0008)     Initial Impression / Assessment and Plan / ED Course  I have reviewed the triage vital signs and the nursing notes.  Pertinent labs & imaging results that were available during my care of the patient were reviewed by me and considered in my medical decision making (see chart for details).  Clinical Course as of Feb 13 344  Sun Feb 12, 2018  0125 Patient reevaluated at bedside and ambulated.  Patient  neurologically intact.  Will provide Toradol prior to discharge.  Education provided on concussion including return precautions for changes in mental status, extremity weakness or numbness, changes in speech, loss of bowel or bladder control, saddle anesthesia, or difficulty ambulating.  Patient is in understanding and agree with plan of care.  [AM]    Clinical Course User Index [AM] Elisha Ponder, PA-C   Patient with high risk injury for intracranial or cervical spine pathology and cannot be cleared by [facility] for [facility].  Will obtain CT head noncontrast, CT spine without contrast, as well as CT chest with T-spine no charge given patient's thoracic spine tenderness.   CT head, C-spine, chest with T-spine, and radiographs of lumbar spine and sacrum without acute abnormalities.  C-spine cleared.  Patient ambulating in the department without difficulty after incident.  Provided patient education on concussion, and that the symptoms of concussion persist, she should be reevaluated by primary provider.  Patient also to follow-up with her previously established orthopedic physician should she have ongoing neck or back pain.  Opiates were prescribed for an acute, painful condition. The patient was given information on side effects and encouraged to use other, non-opiate pain medication primary, only using opiate medicine sparingly for severe pain.  Patient instructed not to drive, drink alcohol, or operate machinery while taking Norco or Flexeril.  This is a supervised visit with Dr. Thayer Ohm Tegeler. Evaluation, management, and discharge planning discussed with this attending physician.   Final Clinical Impressions(s) / ED Diagnoses   Final diagnoses:  Fall, initial encounter  Neck pain    ED Discharge Orders    None       Delia Chimes 02/12/18 8119    Tegeler, Canary Brim, MD 02/12/18 1044

## 2018-02-12 NOTE — Discharge Instructions (Signed)
Please see the information and instructions below regarding your visit.  Your diagnoses today include:   1. Fall, initial encounter 2. Neck pain  Your imaging is normal today. It shows no signs of bleeding in the brain. Concussions are caused by acceleration/deceleration forces of the brain against the skull and in mild forms, cannot be seen on any imaging. The injury occurs at the microscopic level, and causes a disturbance more in function than the structure of the brain itself. Common symptoms of concussion include: ?Poor coordination such as stumbling or inability to walk in a straight line ?Vacant stare (befuddled facial expression) ?Delayed verbal expression (slower to answer questions or follow instructions) ?Inability to focus attention (easily distracted and unable to follow through with normal activities) ?Disorientation (walking in the wrong direction, unaware of time, date, place) ?Slurred or incoherent speech (making disjointed or incomprehensible statements) ?Emotionality out of proportion to circumstances (appearing distraught, crying for no apparent reason) ?Memory deficits (exhibited by patient repeatedly asking the same question that has already been answered or inability to recall three of three words after five minutes)  If you are still having headaches, nausea, or difficulty with memory when you are set to go back to work in 5 days, you will need to see a primary care provider to clear you.  These are signs of concussions which I listed above.  Tests performed today include: CT scan showed no bleeding in the brain. See side panel of your discharge paperwork for testing performed today. Vital signs are listed at the bottom of these instructions.   CT scan of your neck, back demonstrates no signs of fracture or injury.  Medications prescribed:    Avoid aspirin. Take only ibuprofen (Advil) or naproxen (Aleve), and acetaminophen (Tylenol).  Take any prescribed  medications only as prescribed, and any over the counter medications only as directed on the packaging.  You have been prescribed Norco for pain. This is an opioid pain medication. You may take this medication every 4-6 hours as needed for pain. Only take this medication if you need it for breakthrough pain. You may combine this medicine with ibuprofen, a non-steroidal anti-inflammatory drug (NSAID) every 6 hours, so you are getting something for pain relief every 3 hours.  Do not combine this medication with Tylenol, as it may increase the risk of liver problems.  Do not combine this medication with alcohol.  Please be advised to avoid driving or operating heavy machinery while taking this medication, as it may make you drowsy or impair judgment.    Home care instructions:  Please follow any educational materials contained in this packet.    Keep head elevated at all times for the first 24 hours (Elevate mattress if pillow is ineffective)  Do not take tranquilizers, sedatives, narcotics or alcohol  Use ice packs for comfort Have parent, spouse, or friend awaken patient every  Follow-up instructions: Please follow-up with your primary care provider as soon as possible for further evaluation of your symptoms if they are not completely improved.   Return instructions:  Please return to the Emergency Department if you experience worsening symptoms.   If any of the following occur notify your physician or go to the Hospital Emergency Department:  Increased drowsiness, confusion, or loss of consciousness  Restlessness or convulsions (fits)  Paralysis in arms or legs  Temperature above 100 F  Vomiting  Severe headache  Blood or clear fluid dripping from the nose or ears  Stiffness of the neck  Dizziness  or blurred vision  Pulsating pain in the eye  Unequal pupils of eye  Personality changes  Any other unusual symptoms  Please return if you have any other emergent  concerns.  Additional Information:   Your vital signs today were: BP 135/87 (BP Location: Left Arm)    Pulse 72    Temp 98.4 F (36.9 C) (Oral)    Resp 18    SpO2 100%  If your blood pressure (BP) was elevated on multiple readings during this visit above 130 for the top number or above 80 for the bottom number, please have this repeated by your primary care provider within one month. --------------  Thank you for allowing us to participate in your care today. It was my pleasure to care for you!

## 2018-02-12 NOTE — ED Notes (Signed)
Midlevel at Bedside

## 2018-02-12 NOTE — ED Notes (Signed)
Iv Removed from hand

## 2018-04-07 ENCOUNTER — Emergency Department (HOSPITAL_COMMUNITY)
Admission: EM | Admit: 2018-04-07 | Discharge: 2018-04-07 | Disposition: A | Discharge status: Home / Self Care | Payer: Medicaid Other | Attending: Emergency Medicine | Admitting: Emergency Medicine

## 2018-04-07 ENCOUNTER — Encounter (HOSPITAL_COMMUNITY): Payer: Self-pay | Admitting: Emergency Medicine

## 2018-04-07 ENCOUNTER — Emergency Department (HOSPITAL_COMMUNITY): Payer: Medicaid Other

## 2018-04-07 DIAGNOSIS — R110 Nausea: Secondary | ICD-10-CM | POA: Insufficient documentation

## 2018-04-07 DIAGNOSIS — R1011 Right upper quadrant pain: Secondary | ICD-10-CM | POA: Insufficient documentation

## 2018-04-07 DIAGNOSIS — Z79899 Other long term (current) drug therapy: Secondary | ICD-10-CM | POA: Insufficient documentation

## 2018-04-07 DIAGNOSIS — Z87891 Personal history of nicotine dependence: Secondary | ICD-10-CM | POA: Insufficient documentation

## 2018-04-07 DIAGNOSIS — R197 Diarrhea, unspecified: Secondary | ICD-10-CM | POA: Diagnosis present

## 2018-04-07 LAB — CBC
HEMATOCRIT: 44.1 % (ref 36.0–46.0)
HEMOGLOBIN: 14.9 g/dL (ref 12.0–15.0)
MCH: 31.4 pg (ref 26.0–34.0)
MCHC: 33.8 g/dL (ref 30.0–36.0)
MCV: 93 fL (ref 78.0–100.0)
Platelets: 318 10*3/uL (ref 150–400)
RBC: 4.74 MIL/uL (ref 3.87–5.11)
RDW: 12.9 % (ref 11.5–15.5)
WBC: 13.6 10*3/uL — ABNORMAL HIGH (ref 4.0–10.5)

## 2018-04-07 LAB — I-STAT TROPONIN, ED: TROPONIN I, POC: 0 ng/mL (ref 0.00–0.08)

## 2018-04-07 LAB — COMPREHENSIVE METABOLIC PANEL
ALK PHOS: 60 U/L (ref 38–126)
ALT: 30 U/L (ref 14–54)
ANION GAP: 11 (ref 5–15)
AST: 25 U/L (ref 15–41)
Albumin: 4.2 g/dL (ref 3.5–5.0)
BUN: 10 mg/dL (ref 6–20)
CHLORIDE: 104 mmol/L (ref 101–111)
CO2: 23 mmol/L (ref 22–32)
Calcium: 9.4 mg/dL (ref 8.9–10.3)
Creatinine, Ser: 0.84 mg/dL (ref 0.44–1.00)
GFR calc Af Amer: >60 mL/min (ref >60)
GFR calc non Af Amer: >60 mL/min (ref >60)
GLUCOSE: 77 mg/dL (ref 65–99)
POTASSIUM: 3.8 mmol/L (ref 3.5–5.1)
SODIUM: 138 mmol/L (ref 135–145)
Total Bilirubin: 0.6 mg/dL (ref 0.3–1.2)
Total Protein: 7.9 g/dL (ref 6.5–8.1)

## 2018-04-07 LAB — URINALYSIS, ROUTINE W REFLEX MICROSCOPIC
Bilirubin Urine: NEGATIVE
Glucose, UA: NEGATIVE mg/dL
Ketones, ur: NEGATIVE mg/dL
Nitrite: NEGATIVE
PROTEIN: 30 mg/dL — AB
SPECIFIC GRAVITY, URINE: 1.026 (ref 1.005–1.030)
pH: 5 (ref 5.0–8.0)

## 2018-04-07 LAB — I-STAT BETA HCG BLOOD, ED (MC, WL, AP ONLY)

## 2018-04-07 LAB — LIPASE, BLOOD: Lipase: 22 U/L (ref 11–51)

## 2018-04-07 MED ORDER — LOPERAMIDE HCL 2 MG PO CAPS: 2.0000 mg | ORAL_CAPSULE | ORAL | Status: DC | PRN

## 2018-04-07 NOTE — ED Provider Notes (Signed)
[facility]-EMERGENCY DEPT Provider Note   CSN: [id number] Arrival date & time: 04/07/18  1034     History   Chief Complaint Chief Complaint  Patient presents with  . Chest Pain  . Abdominal Pain  . Diarrhea    HPI Krystal Humphrey is a 32 y.o. female.  32 year old female presents with 48-hour history of watery diarrhea with some associated upper quadrant abdominal pain.  Denies any fever or chills.  Has had some nausea.  No recent antibiotics, travel, bad food exposure.  Pain has been persistent and described as dull.  Made somewhat worse with food.  No prior history of same.  Nothing makes it better.     Past Medical History:  Diagnosis Date  . Anxiety   . Depression   . GERD (gastroesophageal reflux disease)   . Hiatal hernia   . History of Clostridium difficile colitis    04/ 2011  . History of duodenal ulcer    2009  . History of esophagitis   . History of panic attacks   . Nephrolithiasis    bilateral per ct 07-27-2016  L > R  (right side nonobstructive)  . Urgency of urination     Patient Active Problem List   Diagnosis Date Noted  . Acute sinus infection 05/26/2017  . Wheezing 05/26/2017  . Right foot pain 05/16/2017  . Seasonal allergic rhinitis due to pollen 05/16/2017  . Bilious vomiting with nausea 04/14/2017  . Acute pain of right knee 11/01/2016  . Nephrolithiasis 07/30/2016  . Kidney stone   . Loose stools 02/06/2016  . Abdominal discomfort 01/19/2016  . Obesity 01/07/2015  . SVD (spontaneous vaginal delivery) 09/24/2011  . DEPRESSIVE TYPE PSYCHOSIS 08/21/2009  . ANEMIA, IRON DEFICIENCY 05/08/2009  . INSECT BITE 05/07/2009  . ULCER-DUODENAL 03/12/2009  . ABSENCE OF MENSTRUATION 10/02/2008  . EPIGASTRIC PAIN 10/01/2008  . REFLUX ESOPHAGITIS 10/10/2007    Past Surgical History:  Procedure Laterality Date  . CYSTOSCOPY WITH RETROGRADE PYELOGRAM, URETEROSCOPY AND STENT PLACEMENT Left 07/30/2016   Procedure: CYSTOSCOPY WITH  LEFT  RETROGRADE PYELOGRAM AND LEFT STENT PLACEMENT;  Surgeon: Malen Gauze, MD;  Location: [facility];  Service: Urology;  Laterality: Left;  . CYSTOSCOPY WITH RETROGRADE PYELOGRAM, URETEROSCOPY AND STENT PLACEMENT Left 08/17/2016   Procedure: CYSTOSCOPY WITH LEFT RETROGRADE  STENT EXCHANGE, URETEROSCOPY AND STONE EXTRACTION, LASER LITHOTRIPSY AND LEFT RETROGRADE PYELOGRAM;  Surgeon: Bjorn Pippin, MD;  Location: [facility];  Service: Urology;  Laterality: Left;  . ESOPHAGOGASTRODUODENOSCOPY  last one 03-26-2009  . KNEE ARTHROSCOPY Right 03/19/2010  . TONSILLECTOMY  child     OB History    Gravida  2   Para  1   Term  1   Preterm      AB  1   Living  1     SAB  1   TAB      Ectopic      Multiple      Live Births  1            Home Medications    Prior to Admission medications   Medication Sig Start Date End Date Taking? Authorizing Provider  albuterol (PROAIR HFA) 108 (90 Base) MCG/ACT inhaler ProAir HFA 90 mcg/actuation aerosol inhaler  INHALE 2 PUFFS PO Q 4 TO 6 H PRF COUGH AND SOB    [provider]  cyclobenzaprine (FLEXERIL) 10 MG tablet Take 1 tablet (10 mg total) by mouth 2 (two) times daily as needed for  muscle spasms. 02/12/18   Aviva Kluver B, PA-C  HYDROcodone-acetaminophen (NORCO) 5-325 MG tablet Take 1 tablet by mouth every 4 (four) hours as needed. 02/12/18   Aviva Kluver B, PA-C  naproxen (NAPROSYN) 500 MG tablet Take 1 tablet (500 mg total) by mouth 2 (two) times daily. 08/03/17   Dartha Lodge, PA-C  naproxen (NAPROSYN) 500 MG tablet Take 1 tablet (500 mg total) 2 (two) times daily by mouth. 10/04/17   Felicie Morn, NP  ondansetron (ZOFRAN ODT) 4 MG disintegrating tablet  ODT q4 hours prn nausea/vomit 08/03/17   Dartha Lodge, PA-C  oxyCODONE-acetaminophen (PERCOCET/ROXICET) 5-325 MG tablet Take 1 tablet every 8 (eight) hours as needed by mouth for severe pain. 10/04/17   Felicie Morn, NP  phentermine 37.5 MG capsule Take 1  capsule (37.5 mg total) by mouth every morning. 03/14/17   Veryl Speak, FNP  tamsulosin (FLOMAX) 0.4 MG CAPS capsule Take 1 capsule (0.4 mg total) by mouth daily. 08/03/17   Dartha Lodge, PA-C  triamcinolone (NASACORT) 55 MCG/ACT AERO nasal inhaler Place 2 sprays into the nose daily. Patient taking differently: Place 2 sprays into the nose daily as needed. Allergies 05/26/17   Corwin Levins, MD    Family History Family History  Problem Relation Age of Onset  . Diabetes Mother   . Thyroid disease Mother   . Heart attack Father   . Diabetes Maternal Aunt   . Diabetes Maternal Uncle   . COPD Maternal Grandmother   . Crohn's disease Maternal Grandmother   . Anesthesia problems Neg Hx   . Hypotension Neg Hx   . Malignant hyperthermia Neg Hx   . Pseudochol deficiency Neg Hx     Social History Social History   Tobacco Use  . Smoking status: Former Smoker    Packs/day: 0.25    Years: 2.00    Pack years: 0.50    Last attempt to quit: 09/20/2008    Years since quitting: 9.5  . Smokeless tobacco: Never Used  Substance Use Topics  . Alcohol use: Yes    Comment: occasional  . Drug use: No     Allergies   Remeron [mirtazapine]; Flagyl [metronidazole]; and Tramadol   Review of Systems Review of Systems  All other systems reviewed and are negative.    Physical Exam Updated Vital Signs BP 131/77 (BP Location: Left Arm)   Pulse 82   Temp 98.3 F (36.8 C) (Oral)   Resp 20   SpO2 98%   Physical Exam  Constitutional: She is oriented to person, place, and time. She appears well-developed and well-nourished.  Non-toxic appearance. No distress.  HENT:  Head: Normocephalic and atraumatic.  Eyes: Pupils are equal, round, and reactive to light. Conjunctivae, EOM and lids are normal.  Neck: Normal range of motion. Neck supple. No tracheal deviation present. No thyroid mass present.  Cardiovascular: Normal rate, regular rhythm and normal heart sounds. Exam reveals no gallop.    No murmur heard. Pulmonary/Chest: Effort normal and breath sounds normal. No stridor. No respiratory distress. She has no decreased breath sounds. She has no wheezes. She has no rhonchi. She has no rales.  Abdominal: Soft. Normal appearance and bowel sounds are normal. She exhibits no distension. There is no tenderness. There is no rigidity, no rebound, no guarding and no CVA tenderness.    Musculoskeletal: Normal range of motion. She exhibits no edema or tenderness.  Neurological: She is alert and oriented to person, place, and time. She has normal  strength. No cranial nerve deficit or sensory deficit. GCS eye subscore is 4. GCS verbal subscore is 5. GCS motor subscore is 6.  Skin: Skin is warm and dry. No abrasion and no rash noted.  Psychiatric: She has a normal mood and affect. Her speech is normal and behavior is normal.  Nursing note and vitals reviewed.    ED Treatments / Results  Labs (all labs ordered are listed, but only abnormal results are displayed) Labs Reviewed  CBC - Abnormal; Notable for the following components:      Result Value   WBC 13.6 (*)    All other components within normal limits  LIPASE, BLOOD  COMPREHENSIVE METABOLIC PANEL  URINALYSIS, ROUTINE W REFLEX MICROSCOPIC  I-STAT BETA HCG BLOOD, ED ([facility], [facility], [facility] ONLY)  I-STAT TROPONIN, ED    EKG EKG Interpretation  Date/Time:  Friday Apr 07 2018 10:45:55 EDT Ventricular Rate:  80 PR Interval:    QRS Duration: 89 QT Interval:  374 QTC Calculation: 432 R Axis:   64 Text Interpretation:  Sinus rhythm no acute st/ts similar to prior 2/18 Confirmed by Meridee Score [phone]) on 04/07/2018 11:11:44 AM Also confirmed by Lorre Nick ([id number])  on 04/07/2018 3:19:11 PM   Radiology No results found.  Procedures Procedures (including critical care time)  Medications Ordered in ED Medications - No data to display   Initial Impression / Assessment and Plan / ED Course  I have reviewed the triage vital signs  and the nursing notes.  Pertinent labs & imaging results that were available during my care of the patient were reviewed by me and considered in my medical decision making (see chart for details).     Patient's urinalysis noted but she has no UTI symptoms at this time.  Patient given Lomotil here.  Ultrasound was negative for cholelithiasis.  Will follow up with her GI doctor.  Final Clinical Impressions(s) / ED Diagnoses   Final diagnoses:  None    ED Discharge Orders    None       Lorre Nick, MD 04/07/18 2014

## 2018-04-07 NOTE — ED Triage Notes (Signed)
Pt c/o central chest pains that started this morning that are intermittent. C/o upper abd pains for 2 days and diarrhea for same as well.

## 2018-05-12 ENCOUNTER — Telehealth: Payer: Self-pay | Admitting: Internal Medicine

## 2018-05-12 NOTE — Telephone Encounter (Signed)
Copied from CRM [phone]. Topic: Appointment Scheduling - New Patient >> May 12, 2018 12:07 PM Cipriano BunkerLambe, Annette S wrote: New patient has been scheduled for your office. Provider: Yetta BarreJones She is asking if Dr. Yetta BarreJones will accept her as his pt. (saw Dr. Carver Filaalone)   [facility].

## 2018-05-12 NOTE — Telephone Encounter (Signed)
Dr. Yetta BarreJones, Patient is requesting to transfer care to you. She was a previous Marcos EkeGreg Calone patient. Would you be willing to accept her to establish care?

## 2018-05-13 ENCOUNTER — Other Ambulatory Visit: Payer: Self-pay | Admitting: Internal Medicine

## 2018-05-14 NOTE — Telephone Encounter (Signed)
No, I am not available to see her

## 2018-05-15 NOTE — Telephone Encounter (Signed)
Informed patient.  Scheduled with Vernona RiegerLaura.

## 2018-05-17 ENCOUNTER — Other Ambulatory Visit (INDEPENDENT_AMBULATORY_CARE_PROVIDER_SITE_OTHER): Payer: BLUE CROSS/BLUE SHIELD

## 2018-05-17 ENCOUNTER — Encounter: Payer: Self-pay | Admitting: Family

## 2018-05-17 ENCOUNTER — Ambulatory Visit (INDEPENDENT_AMBULATORY_CARE_PROVIDER_SITE_OTHER): Payer: BLUE CROSS/BLUE SHIELD | Admitting: Family

## 2018-05-17 VITALS — BP 136/78 | HR 83 | Temp 98.4°F | Ht 68.0 in | Wt 224.1 lb

## 2018-05-17 DIAGNOSIS — R0683 Snoring: Secondary | ICD-10-CM | POA: Diagnosis not present

## 2018-05-17 DIAGNOSIS — E669 Obesity, unspecified: Secondary | ICD-10-CM

## 2018-05-17 DIAGNOSIS — F419 Anxiety disorder, unspecified: Secondary | ICD-10-CM

## 2018-05-17 DIAGNOSIS — R5383 Other fatigue: Secondary | ICD-10-CM | POA: Diagnosis not present

## 2018-05-17 LAB — CBC WITH DIFFERENTIAL/PLATELET
BASOS PCT: 1 % (ref 0.0–3.0)
Basophils Absolute: 0.1 10*3/uL (ref 0.0–0.1)
EOS ABS: 0.5 10*3/uL (ref 0.0–0.7)
EOS PCT: 4.8 % (ref 0.0–5.0)
HCT: 41.5 % (ref 36.0–46.0)
Hemoglobin: 14.6 g/dL (ref 12.0–15.0)
Lymphocytes Relative: 24.4 % (ref 12.0–46.0)
Lymphs Abs: 2.7 10*3/uL (ref 0.7–4.0)
MCHC: 35.3 g/dL (ref 30.0–36.0)
MCV: 90.1 fl (ref 78.0–100.0)
Monocytes Absolute: 0.8 10*3/uL (ref 0.1–1.0)
Monocytes Relative: 7.4 % (ref 3.0–12.0)
Neutro Abs: 7 10*3/uL (ref 1.4–7.7)
Neutrophils Relative %: 62.4 % (ref 43.0–77.0)
PLATELETS: 347 10*3/uL (ref 150.0–400.0)
RBC: 4.6 Mil/uL (ref 3.87–5.11)
RDW: 13.6 % (ref 11.5–15.5)
WBC: 11.2 10*3/uL — ABNORMAL HIGH (ref 4.0–10.5)

## 2018-05-17 LAB — COMPREHENSIVE METABOLIC PANEL
ALT: 36 U/L — ABNORMAL HIGH (ref 0–35)
AST: 23 U/L (ref 0–37)
Albumin: 4.3 g/dL (ref 3.5–5.2)
Alkaline Phosphatase: 65 U/L (ref 39–117)
BUN: 10 mg/dL (ref 6–23)
CHLORIDE: 104 meq/L (ref 96–112)
CO2: 27 mEq/L (ref 19–32)
CREATININE: 0.94 mg/dL (ref 0.40–1.20)
Calcium: 9.6 mg/dL (ref 8.4–10.5)
GFR: 74.53 mL/min (ref >60.00)
GLUCOSE: 94 mg/dL (ref 70–99)
Potassium: 3.7 mEq/L (ref 3.5–5.1)
SODIUM: 139 meq/L (ref 135–145)
Total Bilirubin: 0.8 mg/dL (ref 0.2–1.2)
Total Protein: 7.7 g/dL (ref 6.0–8.3)

## 2018-05-17 LAB — TSH: TSH: 1.77 u[IU]/mL (ref 0.35–4.50)

## 2018-05-17 MED ORDER — ESCITALOPRAM OXALATE 10 MG PO TABS
10.0000 mg | ORAL_TABLET | Freq: Every day | ORAL | 1 refills | Status: DC
Start: 2018-05-17 — End: 2018-07-24

## 2018-05-17 NOTE — Progress Notes (Signed)
Krystal Humphrey is a 32 y.o. female with the following history as recorded in EpicCare:  Patient Active Problem List   Diagnosis Date Noted  . Acute sinus infection 05/26/2017  . Wheezing 05/26/2017  . Right foot pain 05/16/2017  . Seasonal allergic rhinitis due to pollen 05/16/2017  . Bilious vomiting with nausea 04/14/2017  . Acute pain of right knee 11/01/2016  . Nephrolithiasis 07/30/2016  . Kidney stone   . Loose stools 02/06/2016  . Abdominal discomfort 01/19/2016  . Obesity 01/07/2015  . SVD (spontaneous vaginal delivery) 09/24/2011  . DEPRESSIVE TYPE PSYCHOSIS 08/21/2009  . ANEMIA, IRON DEFICIENCY 05/08/2009  . INSECT BITE 05/07/2009  . ULCER-DUODENAL 03/12/2009  . ABSENCE OF MENSTRUATION 10/02/2008  . EPIGASTRIC PAIN 10/01/2008  . REFLUX ESOPHAGITIS 10/10/2007    Current Outpatient Medications  Medication Sig Dispense Refill  . etonogestrel (NEXPLANON) 68 MG IMPL implant 1 each by Subdermal route once.    Marland Kitchen aspirin EC 81 MG tablet Take 81 mg by mouth every 4 (four) hours as needed (chest pain).    Marland Kitchen escitalopram (LEXAPRO) 10 MG tablet Take 1 tablet (10 mg total) by mouth daily. 30 tablet 1   No current facility-administered medications for this visit.     Allergies: Remeron [mirtazapine]; Flagyl [metronidazole]; and Tramadol  Past Medical History:  Diagnosis Date  . Anxiety   . Depression   . GERD (gastroesophageal reflux disease)   . Hiatal hernia   . History of Clostridium difficile colitis    04/ 2011  . History of duodenal ulcer    2009  . History of esophagitis   . History of panic attacks   . Nephrolithiasis    bilateral per ct 07-27-2016  L > R  (right side nonobstructive)  . Urgency of urination     Past Surgical History:  Procedure Laterality Date  . CYSTOSCOPY WITH RETROGRADE PYELOGRAM, URETEROSCOPY AND STENT PLACEMENT Left 07/30/2016   Procedure: CYSTOSCOPY WITH LEFT  RETROGRADE PYELOGRAM AND LEFT STENT PLACEMENT;  Surgeon: Cleon Gustin, MD;   Location: [facility];  Service: Urology;  Laterality: Left;  . CYSTOSCOPY WITH RETROGRADE PYELOGRAM, URETEROSCOPY AND STENT PLACEMENT Left 08/17/2016   Procedure: CYSTOSCOPY WITH LEFT RETROGRADE  STENT EXCHANGE, URETEROSCOPY AND STONE EXTRACTION, LASER LITHOTRIPSY AND LEFT RETROGRADE PYELOGRAM;  Surgeon: Irine Seal, MD;  Location: [facility];  Service: Urology;  Laterality: Left;  . ESOPHAGOGASTRODUODENOSCOPY  last one 03-26-2009  . KNEE ARTHROSCOPY Right 03/19/2010  . TONSILLECTOMY  child    Family History  Problem Relation Age of Onset  . Diabetes Mother   . Thyroid disease Mother   . Heart attack Father   . Diabetes Maternal Aunt   . Diabetes Maternal Uncle   . COPD Maternal Grandmother   . Crohn's disease Maternal Grandmother   . Anesthesia problems Neg Hx   . Hypotension Neg Hx   . Malignant hyperthermia Neg Hx   . Pseudochol deficiency Neg Hx     Social History   Tobacco Use  . Smoking status: Former Smoker    Packs/day: 0.25    Years: 2.00    Pack years: 0.50    Last attempt to quit: 09/20/2008    Years since quitting: 9.6  . Smokeless tobacco: Never Used  Substance Use Topics  . Alcohol use: Yes    Comment: occasional    Subjective:  Patient presents to transfer care today from another provider who has left our office; multiple concerns:  1) Has long-standing history of anxiety/ depression;  has taken Remeron in the past- had anaphylactic reaction; is adamant that she does not want to talk to a therapist; difficulty sleeping due to anxiety; asks specifically about trial of Klonopin or Xanax; is adamant she does not want Zoloft as her mother did not do well on this medication.  2) Also requesting refill on Phentermine; has taken in the past; admits she is not exercising due to work schedule; admits that diet is not good; last took Phentermine in 2018  Sees GYN for pap smear/ Nexplanon in place;    Objective:  Vitals:   05/17/18 1541  BP: 136/78  Pulse:  83  Temp: 98.4 F (36.9 C)  TempSrc: Oral  SpO2: 98%  Weight: 224 lb 1.9 oz (101.7 kg)  Height: _0  (1.727 m)    General: Well developed, well nourished, in no acute distress  Skin : Warm and dry.  Head: Normocephalic and atraumatic  Eyes: Sclera and conjunctiva clear; pupils round and reactive to light; extraocular movements intact  Ears: External normal; canals clear; tympanic membranes normal  Oropharynx: Pink, supple. No suspicious lesions  Neck: Supple without thyromegaly, adenopathy  Lungs: Respirations unlabored; clear to auscultation bilaterally without wheeze, rales, rhonchi  CVS exam: normal rate and regular rhythm.  Neurologic: Alert and oriented; speech intact; face symmetrical; moves all extremities well; CNII-XII intact without focal deficit  Assessment:  1. Obesity, unspecified classification, unspecified obesity type, unspecified whether serious comorbidity present   2. Other fatigue   3. Snoring   4. Anxiety     Plan:  1. Refer to weight loss management; 2. Check CBC, CMP, TSH today; 3. Refer for sleep study; 4. Trial of Lexapro 10 mg daily; risks and benefits discussed; follow-up in 1 month, sooner prn.    Return in about 1 month (around 06/14/2018).  Orders Placed This Encounter  Procedures  . CBC w/Diff    Standing Status:   Future    Number of Occurrences:   1    Standing Expiration Date:   05/17/2019  . Comp Met (CMET)    Standing Status:   Future    Number of Occurrences:   1    Standing Expiration Date:   05/17/2019  . TSH    Standing Status:   Future    Number of Occurrences:   1    Standing Expiration Date:   05/17/2019  . Amb Ref to Medical Weight Management    Referral Priority:   Routine    Referral Type:   Consultation    Referred to Provider:   Starlyn Skeans, MD    Number of Visits Requested:   1  . Ambulatory referral to Neurology    Referral Priority:   Routine    Referral Type:   Consultation    Referral Reason:   Specialty  Services Required    Requested Specialty:   Neurology    Number of Visits Requested:   1    Requested Prescriptions   Signed Prescriptions Disp Refills  . escitalopram (LEXAPRO) 10 MG tablet 30 tablet 1    Sig: Take 1 tablet (10 mg total) by mouth daily.

## 2018-06-16 ENCOUNTER — Ambulatory Visit: Payer: BLUE CROSS/BLUE SHIELD | Admitting: Family

## 2018-06-16 DIAGNOSIS — Z0289 Encounter for other administrative examinations: Secondary | ICD-10-CM

## 2018-07-24 ENCOUNTER — Encounter: Payer: Self-pay | Admitting: Neurology

## 2018-07-24 ENCOUNTER — Ambulatory Visit (INDEPENDENT_AMBULATORY_CARE_PROVIDER_SITE_OTHER): Payer: BLUE CROSS/BLUE SHIELD | Admitting: Neurology

## 2018-07-24 VITALS — BP 134/81 | HR 80 | Ht 68.0 in | Wt 229.0 lb

## 2018-07-24 DIAGNOSIS — G478 Other sleep disorders: Secondary | ICD-10-CM

## 2018-07-24 DIAGNOSIS — R0683 Snoring: Secondary | ICD-10-CM

## 2018-07-24 DIAGNOSIS — G4719 Other hypersomnia: Secondary | ICD-10-CM

## 2018-07-24 DIAGNOSIS — G479 Sleep disorder, unspecified: Secondary | ICD-10-CM

## 2018-07-24 DIAGNOSIS — F419 Anxiety disorder, unspecified: Secondary | ICD-10-CM

## 2018-07-24 NOTE — Progress Notes (Signed)
Subjective:    Patient ID: Krystal Humphrey is a 32 y.o. female.  HPI     Krystal Foley, MD, PhD [facility] [address], [address] [address] [place], [place] [address]  Dear Krystal Humphrey,   I saw your patient, Krystal Humphrey, upon your kind request in my neurologic clinic today for initial consultation of her sleep disorder, in particular, concern for underlying obstructive sleep apnea. The patient is unaccompanied today. As you know, Krystal Humphrey is a 32 year old right-handed woman with an underlying medical history of reflux disease with history of esophagitis, anxiety, depression, history of duodenal ulcer, history of C. difficile colitis, nephrolithiasis and obesity, who reports snoring and excessive daytime somnolence, difficulty maintaining sleep and nonrestorative sleep. Her Epworth sleepiness score is 10 out of 24 today, fatigue score is 36 out of 63. She lives with her ex-boyfriend and her 30 yo daughter. She drives and off-road dump truck. She works in [place]. Bedtime is between 9 and 10 and rise time is around 5:40 AM. She sleeps on a queen size bed and her daughter's bedroom. There is a TV in the bedroom but she does not typically watch it at night. She has gained weight recently. She tosses and turns a lot at night. She does not wake up rested. She does not have night to night nocturia or morning headaches. She had a tonsillectomy as a child. She believes she has some family history of sleep apnea. She quit smoking on a regular basis over 6 years ago but smokes occasionally now. She drinks alcohol in the form of beer, 2-3 per night on average. She has noticed that it helps her go to sleep.   Her Past Medical History Is Significant For: Past Medical History:  Diagnosis Date  . Anxiety   . Depression   . GERD (gastroesophageal reflux disease)   . Hiatal hernia   . History of Clostridium difficile colitis    04/ 2011  . History of duodenal ulcer    2009  . History of  esophagitis   . History of panic attacks   . Nephrolithiasis    bilateral per ct 07-27-2016  L > R  (right side nonobstructive)  . Urgency of urination     Her Past Surgical History Is Significant For: Past Surgical History:  Procedure Laterality Date  . CYSTOSCOPY WITH RETROGRADE PYELOGRAM, URETEROSCOPY AND STENT PLACEMENT Left 07/30/2016   Procedure: CYSTOSCOPY WITH LEFT  RETROGRADE PYELOGRAM AND LEFT STENT PLACEMENT;  Surgeon: Malen Gauze, MD;  Location: [facility];  Service: Urology;  Laterality: Left;  . CYSTOSCOPY WITH RETROGRADE PYELOGRAM, URETEROSCOPY AND STENT PLACEMENT Left 08/17/2016   Procedure: CYSTOSCOPY WITH LEFT RETROGRADE  STENT EXCHANGE, URETEROSCOPY AND STONE EXTRACTION, LASER LITHOTRIPSY AND LEFT RETROGRADE PYELOGRAM;  Surgeon: Bjorn Pippin, MD;  Location: [facility];  Service: Urology;  Laterality: Left;  . ESOPHAGOGASTRODUODENOSCOPY  last one 03-26-2009  . KNEE ARTHROSCOPY Right 03/19/2010  . TONSILLECTOMY  child    Her Family History Is Significant For: Family History  Problem Relation Age of Onset  . Diabetes Mother   . Thyroid disease Mother   . Heart attack Father   . Diabetes Maternal Aunt   . Diabetes Maternal Uncle   . COPD Maternal Grandmother   . Crohn's disease Maternal Grandmother   . Anesthesia problems Neg Hx   . Hypotension Neg Hx   . Malignant hyperthermia Neg Hx   . Pseudochol deficiency Neg Hx     Her Social History  Is Significant For: Social History   Socioeconomic History  . Marital status: Divorced    Spouse name: Not on file  . Number of children: 1  . Years of education: 26  . Highest education level: Not on file  Occupational History  . Occupation: unemployed  Social Needs  . Financial resource strain: Not on file  . Food insecurity:    Worry: Not on file    Inability: Not on file  . Transportation needs:    Medical: Not on file    Non-medical: Not on file  Tobacco Use  . Smoking status: Former Smoker     Packs/day: 0.25    Years: 2.00    Pack years: 0.50    Last attempt to quit: 09/20/2008    Years since quitting: 9.8  . Smokeless tobacco: Never Used  Substance and Sexual Activity  . Alcohol use: Yes    Comment: occasional  . Drug use: No  . Sexual activity: Not on file    Comment: implant placed 09/ 2014  Lifestyle  . Physical activity:    Days per week: Not on file    Minutes per session: Not on file  . Stress: Not on file  Relationships  . Social connections:    Talks on phone: Not on file    Gets together: Not on file    Attends religious service: Not on file    Active member of club or organization: Not on file    Attends meetings of clubs or organizations: Not on file    Relationship status: Not on file  Other Topics Concern  . Not on file  Social History Narrative   Born and raised in [place], [place]. Currently resides in a house with her boyfriend and daughter. 2 dogs. Fun: Softball, bowl   Denies religious beliefs that would effect health care.     Her Allergies Are:  Allergies  Allergen Reactions  . Remeron [Mirtazapine] Anaphylaxis  . Flagyl [Metronidazole] Nausea And Vomiting  . Tramadol Itching  :   Her Current Medications Are:  Outpatient Encounter Medications as of 07/24/2018  Medication Sig  . etonogestrel (NEXPLANON) 68 MG IMPL implant 1 each by Subdermal route once.  . [DISCONTINUED] aspirin EC 81 MG tablet Take 81 mg by mouth every 4 (four) hours as needed (chest pain).  . [DISCONTINUED] escitalopram (LEXAPRO) 10 MG tablet Take 1 tablet (10 mg total) by mouth daily.   No facility-administered encounter medications on file as of 07/24/2018.   :  Review of Systems:  Out of a complete 14 point review of systems, all are reviewed and negative with the exception of these symptoms as listed below: Review of Systems  Neurological:       Pt presents today to discuss her sleep. Pt has never had a sleep study and does endorse snoring.  Epworth Sleepiness  Scale 0= would never doze 1= slight chance of dozing 2= moderate chance of dozing 3= high chance of dozing  Sitting and reading: 1 Watching TV: 2 Sitting inactive in a public place (ex. Theater or meeting): 1 As a passenger in a car for an hour without a break: 1 Lying down to rest in the afternoon: 3 Sitting and talking to someone: 0 Sitting quietly after lunch (no alcohol): 2 In a car, while stopped in traffic: 0 Total: 10     Objective:  Neurological Exam  Physical Exam Physical Examination:   Vitals:   07/24/18 1114  BP: 134/81  Pulse: 80  General Examination: The patient is a very pleasant 32 y.o. female in no acute distress. She appears well-developed and well-nourished and well groomed.   HEENT: Normocephalic, atraumatic, pupils are equal, round and reactive to light and accommodation. Corrective eyeglasses in place. Extraocular tracking is good without limitation to gaze excursion or nystagmus noted. Normal smooth pursuit is noted. Hearing is grossly intact. Face is symmetric with normal facial animation and normal facial sensation. Speech is clear with no dysarthria noted. There is no hypophonia. There is no lip, neck/head, jaw or voice tremor. Neck is supple with full range of passive and active motion. There are no carotid bruits on auscultation. Oropharynx exam reveals: mild mouth dryness, marginal dental hygiene and mild airway crowding, due to smaller airway entry. Mallampati is class I. Tongue protrudes centrally and palate elevates symmetrically. Tonsils are absent. Neck size is 16 7/8 inches. She has a Moderate overbite.   Chest: Clear to auscultation without wheezing, rhonchi or crackles noted.  Heart: S1+S2+0, regular and normal without murmurs, rubs or gallops noted.   Abdomen: Soft, non-tender and non-distended with normal bowel sounds appreciated on auscultation.  Extremities: There is no pitting edema in the distal lower extremities bilaterally. Pedal  pulses are intact.  Skin: Warm and dry without trophic changes noted.  Musculoskeletal: exam reveals no obvious joint deformities, tenderness or joint swelling or erythema.   Neurologically:  Mental status: The patient is awake, alert and oriented in all 4 spheres. Her immediate and remote memory, attention, language skills and fund of knowledge are appropriate. There is no evidence of aphasia, agnosia, apraxia or anomia. Speech is clear with normal prosody and enunciation. Thought process is linear. Mood is normal and affect is normal.  Cranial nerves II - XII are as described above under HEENT exam. In addition: shoulder shrug is normal with equal shoulder height noted. Motor exam: Normal bulk, strength and tone is noted. There is no drift, tremor or rebound. Romberg is negative. Reflexes are 2+ throughout. Fine motor skills and coordination: grossly intact.  Cerebellar testing: No dysmetria or intention tremor on finger to nose testing. Heel to shin is unremarkable bilaterally. There is no truncal or gait ataxia.  Sensory exam: intact to light touch in the upper and lower extremities.  Gait, station and balance: She stands easily. No veering to one side is noted. No leaning to one side is noted. Posture is age-appropriate and stance is narrow based. Gait shows normal stride length and normal pace. No problems turning are noted. Tandem walk is unremarkable.   Assessment and Plan:   In summary, Krystal Humphrey is a very pleasant 32 y.o.-year old female with an underlying medical history of reflux disease with history of esophagitis, anxiety, depression, history of duodenal ulcer, history of C. difficile colitis, nephrolithiasis and obesity, whose history and physical exam are concerning for obstructive sleep apnea (OSA). I had a long chat with the patient  about my findings and the diagnosis of OSA, its prognosis and treatment options. We talked about medical treatments, surgical interventions and  non-pharmacological approaches. I explained in particular the risks and ramifications of untreated moderate to severe OSA, especially with respect to developing cardiovascular disease down the Road, including congestive heart failure, difficult to treat hypertension, cardiac arrhythmias, or stroke. Even type 2 diabetes has, in part, been linked to untreated OSA. Symptoms of untreated OSA include daytime sleepiness, memory problems, mood irritability and mood disorder such as depression and anxiety, lack of energy, as well as recurrent headaches, especially  morning headaches. We talked about smoking cessation and trying to maintain a healthy lifestyle in general, as well as the importance of weight control. I encouraged the patient to eat healthy, exercise daily and keep well hydrated, to keep a scheduled bedtime and wake time routine, to not skip any meals and eat healthy snacks in between meals. I advised the patient not to drive when feeling sleepy. She is strongly advised not to utilize alcohol to help initiate sleep. I recommended the following at this time: sleep study with potential positive airway pressure titration. (We will score hypopneas at 4%).   I explained the sleep test procedure to the patient and also outlined possible surgical and non-surgical treatment options of OSA, including the use of a custom-made dental device (which would require a referral to a specialist dentist or oral surgeon), upper airway surgical options, such as pillar implants, radiofrequency surgery, tongue base surgery, and UPPP (which would involve a referral to an ENT surgeon). Rarely, jaw surgery such as mandibular advancement may be considered.  I also explained the CPAP treatment option to the patient, who indicated that she would be willing to try CPAP if the need arises. I explained the importance of being compliant with PAP treatment, not only for insurance purposes but primarily to improve Her symptoms, and for the  patient's long term health benefit, including to reduce Her cardiovascular risks. I answered all her questions today and the patient was in agreement. I plan to see her back after the sleep study is completed and encouraged her to call with any interim questions, concerns, problems or updates.   Thank you very much for allowing me to participate in the care of this nice patient. If I can be of any further assistance to you please do not hesitate to call me at [phone].  Sincerely,   Krystal Foley, MD, PhD

## 2018-07-24 NOTE — Patient Instructions (Signed)

## 2018-07-25 ENCOUNTER — Institutional Professional Consult (permissible substitution): Payer: Medicaid Other | Admitting: Neurology

## 2018-08-16 ENCOUNTER — Ambulatory Visit (INDEPENDENT_AMBULATORY_CARE_PROVIDER_SITE_OTHER): Payer: BLUE CROSS/BLUE SHIELD | Admitting: Neurology

## 2018-08-16 DIAGNOSIS — R0683 Snoring: Secondary | ICD-10-CM

## 2018-08-16 DIAGNOSIS — G4710 Hypersomnia, unspecified: Secondary | ICD-10-CM

## 2018-08-16 DIAGNOSIS — G479 Sleep disorder, unspecified: Secondary | ICD-10-CM

## 2018-08-16 DIAGNOSIS — G478 Other sleep disorders: Secondary | ICD-10-CM

## 2018-08-16 DIAGNOSIS — F419 Anxiety disorder, unspecified: Secondary | ICD-10-CM

## 2018-08-16 DIAGNOSIS — G4719 Other hypersomnia: Secondary | ICD-10-CM

## 2018-08-21 ENCOUNTER — Telehealth: Payer: Self-pay

## 2018-08-21 NOTE — Progress Notes (Signed)
Patient referred by Ria ClockLaura Murray, NP, seen by me on 07/24/18, diagnostic PSG on 08/16/18.   Please call and notify the patient that the recent sleep study did not any significant obstructive or central sleep disordered breathing. All stages of sleep were achieved, sleep efficiency was quite high for a sleep study (95%), no periodic limb movements were noted, no significant snoring. I would encourage her to FU with PCP.  Thanks,  Huston FoleySaima Kea Callan, MD, PhD [facility])

## 2018-08-21 NOTE — Telephone Encounter (Signed)
I called pt and discussed her sleep study results. Pt will follow up with Ria ClockLaura Murray, NP. Pt verbalized understanding of results. Pt had no questions at this time but was encouraged to call back if questions arise.

## 2018-08-21 NOTE — Telephone Encounter (Signed)
-----   Message from Huston FoleySaima Athar, MD sent at 08/21/2018  8:17 AM EDT ----- Patient referred by Ria ClockLaura Murray, NP, seen by me on 07/24/18, diagnostic PSG on 08/16/18.   Please call and notify the patient that the recent sleep study did not any significant obstructive or central sleep disordered breathing. All stages of sleep were achieved, sleep efficiency was quite high for a sleep study (95%), no periodic limb movements were noted, no significant snoring. I would encourage her to FU with PCP.  Thanks,  Huston FoleySaima Athar, MD, PhD [facility])

## 2018-08-21 NOTE — Procedures (Signed)
PATIENT'S NAME:  Krystal Humphrey, Krystal Humphrey DOB:      06-21-1986      MR#:    [id number]     DATE OF RECORDING: 08/16/2018 REFERRING M.D.:  Olive Bass, FNP Study Performed:   Baseline Polysomnogram HISTORY: 32 year old woman with a history of reflux disease, esophagitis, anxiety, depression, history of duodenal ulcer, history of C. difficile colitis, nephrolithiasis and obesity, who reports snoring and excessive daytime somnolence, difficulty maintaining sleep and nonrestorative sleep. The patient endorsed the Epworth Sleepiness Scale at 10/24 points. The patient's weight 229 pounds with a height of 68 (inches), resulting in a BMI of 34.7 kg/m2. The patient's neck circumference measured 16 inches.  CURRENT MEDICATIONS: Nexplanon, Lexapro   PROCEDURE:  This is a multichannel digital polysomnogram utilizing the Somnostar 11.2 system.  Electrodes and sensors were applied and monitored per AASM Specifications.   EEG, EOG, Chin and Limb EMG, were sampled at 200 Hz.  ECG, Snore and Nasal Pressure, Thermal Airflow, Respiratory Effort, CPAP Flow and Pressure, Oximetry was sampled at 50 Hz. Digital video and audio were recorded.      BASELINE STUDY  Lights Out was at 22:46 and Lights On at 05:03.  Total recording time (TRT) was 377.5 minutes, with a total sleep time (TST) of 361 minutes.   The patient's sleep latency was 7 minutes. REM latency was 93 minutes, which is normal. The sleep efficiency was 95.6 %.     SLEEP ARCHITECTURE: WASO (Wake after sleep onset) was 12.5 minutes.  There were 11 minutes in Stage N1, 169.5 minutes Stage N2, 80 minutes Stage N3 and 100.5 minutes in Stage REM.  The percentage of Stage N1 was 3.%, Stage N2 was 47.%, which is normal, Stage N3 was 22.2% and Stage R (REM sleep) was 27.8%, which is slightly elevated. The arousals were noted as: 21 were spontaneous, 0 were associated with PLMs, 0 were associated with respiratory events.  RESPIRATORY ANALYSIS:  There were a total of 0  respiratory events:  0 obstructive apneas, 0 central apneas and 0 mixed apneas with a total of 0 apneas and an apnea index (AI) of 0 /hour. There were 0 hypopneas with a hypopnea index of 0 /hour. The patient also had 0 respiratory event related arousals (RERAs).      The total APNEA/HYPOPNEA INDEX (AHI) was 0 /hour and the total RESPIRATORY DISTURBANCE INDEX was 0 /hour.  0 events occurred in REM sleep and 0 events in NREM. The REM AHI was 0 /hour, versus a non-REM AHI of 0. The patient spent 114 minutes of total sleep time in the supine position and 247 minutes in non-supine.. The supine AHI was 0.0 versus a non-supine AHI of 0.0.  OXYGEN SATURATION & C02:  The Wake baseline 02 saturation was 97%, with the lowest being 85%. Time spent below 89% saturation equaled 16 minutes (which appears overestimated, due to O2 sensor issue).  PERIODIC LIMB MOVEMENTS: The patient had a total of 0 Periodic Limb Movements.  The Periodic Limb Movement (PLM) index was 0 and the PLM Arousal index was 0/hour.  Audio and video analysis did not show any abnormal or unusual movements, behaviors, phonations or vocalizations. The patient took no bathroom breaks. No significant snoring was noted. The EKG was in keeping with normal sinus rhythm (NSR).   Post-study, the patient indicated that sleep was the same as usual.   IMPRESSION:  1. Sleep Disturbance, NOS  RECOMMENDATIONS:  1. This study does not demonstrate any significant obstructive or central sleep  disordered breathing. All stages of sleep were achieved, sleep efficiency was quite high for a sleep study, no periodic limb movements were noted, no significant snoring. This study does not support an intrinsic sleep disorder as a cause of the patient's symptoms. Other causes, including circadian rhythm disturbances, an underlying mood disorder, medication effect and/or an underlying medical problem cannot be ruled out. 2. The patient should be cautioned not to drive,  work at heights, or operate dangerous or heavy equipment when tired or sleepy. Review and reiteration of good sleep hygiene measures should be pursued with any patient. 3. The patient will be advised to follow up with the referring provider, who will be notified of the test results.  I certify that I have reviewed the entire raw data recording prior to the issuance of this report in accordance with the Standards of Accreditation of the American Academy of Sleep Medicine (AASM)  Huston FoleySaima Xara Paulding, MD, PhD Diplomat, American Board of Neurology and Sleep Medicine (Neurology and Sleep Medicine)

## 2019-02-04 ENCOUNTER — Emergency Department (HOSPITAL_COMMUNITY): Payer: 59

## 2019-02-04 ENCOUNTER — Emergency Department (HOSPITAL_COMMUNITY)
Admission: EM | Admit: 2019-02-04 | Discharge: 2019-02-04 | Disposition: A | Discharge status: Home / Self Care | Payer: 59 | Attending: Emergency Medicine | Admitting: Emergency Medicine

## 2019-02-04 ENCOUNTER — Encounter (HOSPITAL_COMMUNITY): Payer: Self-pay

## 2019-02-04 DIAGNOSIS — R1013 Epigastric pain: Secondary | ICD-10-CM | POA: Diagnosis not present

## 2019-02-04 DIAGNOSIS — Z79899 Other long term (current) drug therapy: Secondary | ICD-10-CM | POA: Diagnosis not present

## 2019-02-04 DIAGNOSIS — R1011 Right upper quadrant pain: Secondary | ICD-10-CM

## 2019-02-04 DIAGNOSIS — Z87891 Personal history of nicotine dependence: Secondary | ICD-10-CM | POA: Insufficient documentation

## 2019-02-04 LAB — COMPREHENSIVE METABOLIC PANEL
ALBUMIN: 4.1 g/dL (ref 3.5–5.0)
ALT: 37 U/L (ref 0–44)
ANION GAP: 9 (ref 5–15)
AST: 28 U/L (ref 15–41)
Alkaline Phosphatase: 63 U/L (ref 38–126)
BUN: 11 mg/dL (ref 6–20)
CHLORIDE: 106 mmol/L (ref 98–111)
CO2: 23 mmol/L (ref 22–32)
CREATININE: 0.8 mg/dL (ref 0.44–1.00)
Calcium: 9.2 mg/dL (ref 8.9–10.3)
Glucose, Bld: 101 mg/dL — ABNORMAL HIGH (ref 70–99)
Potassium: 3.7 mmol/L (ref 3.5–5.1)
SODIUM: 138 mmol/L (ref 135–145)
Total Bilirubin: 1.2 mg/dL (ref 0.3–1.2)
Total Protein: 7.6 g/dL (ref 6.5–8.1)

## 2019-02-04 LAB — URINALYSIS, ROUTINE W REFLEX MICROSCOPIC
BILIRUBIN URINE: NEGATIVE
Glucose, UA: NEGATIVE mg/dL
HGB URINE DIPSTICK: NEGATIVE
Ketones, ur: NEGATIVE mg/dL
LEUKOCYTE UA: NEGATIVE
Nitrite: NEGATIVE
PH: 9 — AB (ref 5.0–8.0)
Protein, ur: NEGATIVE mg/dL
SPECIFIC GRAVITY, URINE: 1.018 (ref 1.005–1.030)

## 2019-02-04 LAB — CBC
HEMATOCRIT: 44.5 % (ref 36.0–46.0)
Hemoglobin: 14.5 g/dL (ref 12.0–15.0)
MCH: 30.2 pg (ref 26.0–34.0)
MCHC: 32.6 g/dL (ref 30.0–36.0)
MCV: 92.7 fL (ref 80.0–100.0)
NRBC: 0 % (ref 0.0–0.2)
Platelets: 365 10*3/uL (ref 150–400)
RBC: 4.8 MIL/uL (ref 3.87–5.11)
RDW: 12.8 % (ref 11.5–15.5)
WBC: 12.5 10*3/uL — ABNORMAL HIGH (ref 4.0–10.5)

## 2019-02-04 LAB — LIPASE, BLOOD: LIPASE: 22 U/L (ref 11–51)

## 2019-02-04 LAB — I-STAT BETA HCG BLOOD, ED (MC, WL, AP ONLY)

## 2019-02-04 MED ORDER — SUCRALFATE 1 G PO TABS: 1.0000 g | ORAL_TABLET | Freq: Three times a day (TID) | ORAL | 0 refills | Status: DC

## 2019-02-04 MED ORDER — SODIUM CHLORIDE 0.9 % IV BOLUS
1000.0000 mL | Freq: Once | INTRAVENOUS | Status: AC
  Administered 2019-02-04: 1000 mL via INTRAVENOUS

## 2019-02-04 MED ORDER — SODIUM CHLORIDE 0.9% FLUSH: 3.0000 mL | Freq: Once | INTRAVENOUS | Status: DC

## 2019-02-04 MED ORDER — ONDANSETRON HCL 4 MG/2ML IJ SOLN
4.0000 mg | Freq: Once | INTRAMUSCULAR | Status: AC
  Administered 2019-02-04: 4 mg via INTRAVENOUS
  Filled 2019-02-04: qty 2

## 2019-02-04 MED ORDER — FAMOTIDINE 20 MG PO TABS: 20.0000 mg | ORAL_TABLET | Freq: Two times a day (BID) | ORAL | 0 refills | Status: DC

## 2019-02-04 MED ORDER — MORPHINE SULFATE (PF) 4 MG/ML IV SOLN
4.0000 mg | Freq: Once | INTRAVENOUS | Status: AC
  Administered 2019-02-04: 4 mg via INTRAVENOUS
  Filled 2019-02-04: qty 1

## 2019-02-04 MED ORDER — ONDANSETRON 4 MG PO TBDP: 4.0000 mg | ORAL_TABLET | Freq: Three times a day (TID) | ORAL | 0 refills | Status: DC | PRN

## 2019-02-04 MED ORDER — IOHEXOL 300 MG/ML  SOLN
125.0000 mL | Freq: Once | INTRAMUSCULAR | Status: AC | PRN
  Administered 2019-02-04: 125 mL via INTRAVENOUS

## 2019-02-04 NOTE — ED Notes (Signed)
Patient transported to Ultrasound 

## 2019-02-04 NOTE — ED Provider Notes (Signed)
[facility] Provider Note   CSN: [id number] Arrival date & time: 02/04/19  1909  History   Chief Complaint Chief Complaint  Patient presents with  . Abdominal Pain    HPI Krystal Humphrey is a 33 y.o. female with past medical history significant for GERD, hiatal hernia, duodenal ulcer, esophagitis who presents for evaluation of right upper quadrant and epigastric abdominal pain.  Patient states she has had a gastric and right upper quadrant abdominal pain since Friday after she went "drinking."  States this pain will sometimes radiate into her back.  Has had persistent nausea without emesis.  She rates her pain a 9/10.  Patient states pain "flares" after she eats.  Denies known history of gallstones.  Denies fever, chills, chest pain, shortness of breath, diarrhea, dysuria, hemoptysis, hematochezia, melena, bright red blood per rectum. Has not taking anything for her symptoms. Denies additional aggravating or alleviating factors. Denies prior abdominal surgeries. Last p.o. intake 1 PM today. Denies reflux symptoms.  States she does not use NSAIDs, however does use Goody powders quite frequently(2-3x week). Patient states pain did began after she went "drinking and drink a lot of Jaeger bombs" on Friday.  History obtained from patient.  No interpreter was used.     HPI  Past Medical History:  Diagnosis Date  . Anxiety   . Depression   . GERD (gastroesophageal reflux disease)   . Hiatal hernia   . History of Clostridium difficile colitis    04/ 2011  . History of duodenal ulcer    2009  . History of esophagitis   . History of panic attacks   . Nephrolithiasis    bilateral per ct 07-27-2016  L > R  (right side nonobstructive)  . Urgency of urination     Patient Active Problem List   Diagnosis Date Noted  . Acute sinus infection 05/26/2017  . Wheezing 05/26/2017  . Right foot pain 05/16/2017  . Seasonal allergic rhinitis due to pollen  05/16/2017  . Bilious vomiting with nausea 04/14/2017  . Acute pain of right knee 11/01/2016  . Nephrolithiasis 07/30/2016  . Kidney stone   . Loose stools 02/06/2016  . Abdominal discomfort 01/19/2016  . Obesity 01/07/2015  . SVD (spontaneous vaginal delivery) 09/24/2011  . DEPRESSIVE TYPE PSYCHOSIS 08/21/2009  . ANEMIA, IRON DEFICIENCY 05/08/2009  . INSECT BITE 05/07/2009  . ULCER-DUODENAL 03/12/2009  . ABSENCE OF MENSTRUATION 10/02/2008  . EPIGASTRIC PAIN 10/01/2008  . REFLUX ESOPHAGITIS 10/10/2007    Past Surgical History:  Procedure Laterality Date  . CYSTOSCOPY WITH RETROGRADE PYELOGRAM, URETEROSCOPY AND STENT PLACEMENT Left 07/30/2016   Procedure: CYSTOSCOPY WITH LEFT  RETROGRADE PYELOGRAM AND LEFT STENT PLACEMENT;  Surgeon: Malen GauzePatrick L McKenzie, MD;  Location: [facility];  Service: Urology;  Laterality: Left;  . CYSTOSCOPY WITH RETROGRADE PYELOGRAM, URETEROSCOPY AND STENT PLACEMENT Left 08/17/2016   Procedure: CYSTOSCOPY WITH LEFT RETROGRADE  STENT EXCHANGE, URETEROSCOPY AND STONE EXTRACTION, LASER LITHOTRIPSY AND LEFT RETROGRADE PYELOGRAM;  Surgeon: Bjorn PippinJohn Wrenn, MD;  [facility];  Service: Urology;  Laterality: Left;  . ESOPHAGOGASTRODUODENOSCOPY  last one 03-26-2009  . KNEE ARTHROSCOPY Right 03/19/2010  . TONSILLECTOMY  child     OB History    Gravida  2   Para  1   Term  1   Preterm      AB  1   Living  1     SAB  1   TAB      Ectopic  Multiple      Live Births  1            Home Medications    Prior to Admission medications   Medication Sig Start Date End Date Taking? Authorizing Provider  etonogestrel (NEXPLANON) 68 MG IMPL implant 1 each by Subdermal route once.    [provider]  famotidine (PEPCID) 20 MG tablet Take 1 tablet (20 mg total) by mouth 2 (two) times daily. 02/04/19   Vonda Harth A, PA-C  ondansetron (ZOFRAN ODT) 4 MG disintegrating tablet Take 1 tablet (4 mg total) by mouth every 8 (eight)  hours as needed for nausea or vomiting. 02/04/19   Henley Boettner A, PA-C  sucralfate (CARAFATE) 1 g tablet Take 1 tablet (1 g total) by mouth 4 (four) times daily -  with meals and at bedtime. 02/04/19   Senie Lanese A, PA-C    Family History Family History  Problem Relation Age of Onset  . Diabetes Mother   . Thyroid disease Mother   . Heart attack Father   . Diabetes Maternal Aunt   . Diabetes Maternal Uncle   . COPD Maternal Grandmother   . Crohn's disease Maternal Grandmother   . Anesthesia problems Neg Hx   . Hypotension Neg Hx   . Malignant hyperthermia Neg Hx   . Pseudochol deficiency Neg Hx     Social History Social History   Tobacco Use  . Smoking status: Former Smoker    Packs/day: 0.25    Years: 2.00    Pack years: 0.50    Last attempt to quit: 09/20/2008    Years since quitting: 10.3  . Smokeless tobacco: Never Used  Substance Use Topics  . Alcohol use: Yes    Comment: occasional  . Drug use: No     Allergies   Remeron [mirtazapine]; Flagyl [metronidazole]; and Tramadol   Review of Systems Review of Systems  Constitutional: Negative.   HENT: Negative.   Eyes: Negative.   Respiratory: Negative.   Cardiovascular: Negative.   Gastrointestinal: Positive for abdominal pain and nausea. Negative for abdominal distention, anal bleeding, blood in stool, constipation, diarrhea, rectal pain and vomiting.  Genitourinary: Negative.   Musculoskeletal: Negative.   Skin: Negative.   Neurological: Negative.   All other systems reviewed and are negative.    Physical Exam Updated Vital Signs BP 121/77 (BP Location: Right Arm)   Pulse 60   Temp 98.8 F (37.1 C)   Resp 18   SpO2 100%   Physical Exam Vitals signs and nursing note reviewed.  Constitutional:      General: She is not in acute distress.    Appearance: She is well-developed. She is not ill-appearing or toxic-appearing.     Comments: Sitting in bed initial evaluation.  No acute distress  noted.  HENT:     Head: Atraumatic.     Mouth/Throat:     Comments: Mucous membranes moist.  Posterior oropharynx clear.  Tonsils without edema or exudate. Eyes:     Extraocular Movements: Extraocular movements intact.     Pupils: Pupils are equal, round, and reactive to light.  Neck:     Musculoskeletal: Normal range of motion.  Cardiovascular:     Rate and Rhythm: Normal rate.     Heart sounds: Normal heart sounds.  Pulmonary:     Effort: No respiratory distress.     Comments: Clear to auscultation bilateral without wheeze, rhonchi or rales.  No accessory muscle usage.  Able to speak in full  sentences. Abdominal:     General: There is no distension.     Comments: Soft without rebound or guarding.  Tenderness to right upper quadrant and epigastric region.  Negative Murphy sign.  Negative CVA tenderness.  Normoactive bowel sounds.  No evidence of abdominal wall herniations.  Musculoskeletal: Normal range of motion.  Skin:    General: Skin is warm and dry.     Comments: Brisk capillary refill.  Neurological:     Mental Status: She is alert.      ED Treatments / Results  Labs (all labs ordered are listed, but only abnormal results are displayed) Labs Reviewed  COMPREHENSIVE METABOLIC PANEL - Abnormal; Notable for the following components:      Result Value   Glucose, Bld 101 (*)    All other components within normal limits  CBC - Abnormal; Notable for the following components:   WBC 12.5 (*)    All other components within normal limits  URINALYSIS, ROUTINE W REFLEX MICROSCOPIC - Abnormal; Notable for the following components:   APPearance TURBID (*)    pH 9.0 (*)    Bacteria, UA RARE (*)    All other components within normal limits  LIPASE, BLOOD  I-STAT BETA HCG BLOOD, ED ([facility], [facility], [facility] ONLY)    EKG None  Radiology Ct Abdomen Pelvis W Contrast  Result Date: 02/04/2019 CLINICAL DATA:  Centralized abdominal pain nausea on opera month when more severe since Friday.  EXAM: CT ABDOMEN AND PELVIS WITH CONTRAST TECHNIQUE: Multidetector CT imaging of the abdomen and pelvis was performed using the standard protocol following bolus administration of intravenous contrast. CONTRAST:  OMNIPAQUE IOHEXOL 300 MG/ML  SOLN COMPARISON:  Right upper quadrant ultrasound from earlier on the same day and CT [facility] 04/28/2017 FINDINGS: LOWER CHEST: Lung bases are clear. Included heart size is normal. No pericardial effusion. HEPATOBILIARY: Liver and gallbladder are normal. PANCREAS: Normal. SPLEEN: Normal. ADRENALS/URINARY TRACT: Kidneys are orthotopic, demonstrating symmetric enhancement. No enhancing renal mass. Nonobstructing calculus in the lower pole the right kidney. The urinary bladder is partially distended and unremarkable. Normal adrenal glands. STOMACH/BOWEL: The stomach, small and large bowel are normal in course and caliber without inflammatory changes. The appendix is not confidently identified but periappendiceal inflammation. VASCULAR/LYMPHATIC: Aortoiliac vessels are normal in course and caliber. No lymphadenopathy by CT size criteria. REPRODUCTIVE: Uterus and adnexa are unremarkable. OTHER: No intraperitoneal free fluid or free air. MUSCULOSKELETAL: Nonacute. IMPRESSION: Nonobstructing calculus in the lower pole the right kidney. No acute bowel inflammation or obstruction. Electronically Signed   By: Tollie Eth M.D.   On: 02/04/2019 22:03   US Abdomen Limited Ruq  Result Date: 02/04/2019 CLINICAL DATA:  Right upper quadrant pain EXAM: ULTRASOUND ABDOMEN LIMITED RIGHT UPPER QUADRANT COMPARISON:  None. FINDINGS: Gallbladder: No gallstones or wall thickening visualized. No sonographic Murphy sign noted by sonographer. Common bile duct: Diameter: 4.2 mm Liver: No focal lesion identified. Within normal limits in parenchymal echogenicity. Portal vein is patent on color Doppler imaging with normal direction of blood flow towards the liver. IMPRESSION: Normal study.  No cause for  pain identified. Electronically Signed   By: Gerome Sam III M.D   On: 02/04/2019 21:20    Procedures Procedures (including critical care time)  Medications Ordered in ED Medications  sodium chloride flush (NS) 0.9 % injection 3 mL (3 mLs Intravenous Not Given 02/04/19 2002)  sodium chloride 0.9 % bolus 1,000 mL (0 mLs Intravenous Stopped 02/04/19 2215)  ondansetron (ZOFRAN) injection 4 mg (4  mg Intravenous Given 02/04/19 2033)  morphine 4 MG/ML injection 4 mg (4 mg Intravenous Given 02/04/19 2034)  iohexol (OMNIPAQUE) 300 MG/ML solution 125 mL (125 mLs Intravenous Contrast Given 02/04/19 2141)   Initial Impression / Assessment and Plan / ED Course  I have reviewed the triage vital signs and the nursing notes.  Pertinent labs & imaging results that were available during my care of the patient were reviewed by me and considered in my medical decision making (see chart for details).  33 year old female appears otherwise well presents for evaluation of epigastric and abdominal pain.  Onset Friday, worse with food intake.  No prior abdominal ultrasounds or abdominal surgeries.  Last p.o. intake 1 PM today.  Afebrile, nonseptic, non-ill-appearing.  Abdomen with tenderness to right upper quadrant and epigastric region.  Negative Murphy sign.  No hemoptysis, melena, bright red blood per rectum to suggest ulcer perforation. Labs obtained from triage.  CBC with mild leukocytosis at 12.5, lipase 22, hCG negative, metabolic panel without electrolyte, renal or liver abnormality.  Urinalysis negative for infection. Will obtain ultrasound and reevaluate.  2120: [place] evidence of cholelithiasis or cholecystitis.  Will order CT scan and reevaluate.  2210: CT scan Abd/Pelvis negative. Able to tolerate PO intake without difficulty in ED. Patient is nontoxic, nonseptic appearing, in no apparent distress.  Patient's pain and other symptoms adequately managed in emergency department.  Fluid bolus given.  Labs, imaging and  vitals reviewed.  Patient does not meet the SIRS or Sepsis criteria.  On repeat exam patient does not have a surgical abdomin and there are no peritoneal signs.  No indication of appendicitis, bowel obstruction, bowel perforation, cholecystitis, diverticulitis, PID or ectopic pregnancy.  Patient discharged home with symptomatic treatment and given strict instructions for follow-up with their primary care physician.  Possible gastritis vs ulcer vs gallbladder sludge. Discussed patient may need to follow-up with PCP or general surgery for possible additional testing if she continues to have pain.  Will DC home with Carafate and Pepcid given history of ulcer. I have also discussed reasons to return immediately to the ER.  Patient expresses understanding and agrees with plan.     Final Clinical Impressions(s) / ED Diagnoses   Final diagnoses:  RUQ pain  Epigastric pain    ED Discharge Orders         Ordered    sucralfate (CARAFATE) 1 g tablet  3 times daily with meals & bedtime     02/04/19 2213    famotidine (PEPCID) 20 MG tablet  2 times daily     02/04/19 2213    ondansetron (ZOFRAN ODT) 4 MG disintegrating tablet  Every 8 hours PRN     02/04/19 2216           Rufino Staup A, PA-C 02/04/19 2222    Melene Plan, DO 02/04/19 2240

## 2019-02-04 NOTE — ED Triage Notes (Signed)
Pt states that she has been having upper abd pain since Friday with radiation to back, with nausea. Pain is worse after eating

## 2019-02-04 NOTE — ED Notes (Signed)
Patient transported to CT 

## 2019-02-04 NOTE — Discharge Instructions (Addendum)
Evaluated today for abdominal pain. [place] and CT scan were negative.  Likely gastritis.  I have prescribed you Carafate as well as Pepcid given your history of ulcers.  Please take as prescribed.  If you continue to have pain I would suggest you follow-up with general surgery for additional testing of your gallbladder.  I have listed their contact information on your discharge paperwork.  You will need to call to schedule an appointment.  Return to the ED for any worsening symptoms.

## 2019-02-07 ENCOUNTER — Emergency Department (HOSPITAL_COMMUNITY): Payer: 59

## 2019-02-07 ENCOUNTER — Emergency Department (HOSPITAL_COMMUNITY)
Admission: EM | Admit: 2019-02-07 | Discharge: 2019-02-07 | Disposition: A | Discharge status: Home / Self Care | Payer: 59 | Attending: Emergency Medicine | Admitting: Emergency Medicine

## 2019-02-07 ENCOUNTER — Other Ambulatory Visit: Payer: Self-pay

## 2019-02-07 ENCOUNTER — Encounter (HOSPITAL_COMMUNITY): Payer: Self-pay

## 2019-02-07 DIAGNOSIS — N23 Unspecified renal colic: Secondary | ICD-10-CM | POA: Diagnosis not present

## 2019-02-07 DIAGNOSIS — Z87891 Personal history of nicotine dependence: Secondary | ICD-10-CM | POA: Diagnosis not present

## 2019-02-07 DIAGNOSIS — R112 Nausea with vomiting, unspecified: Secondary | ICD-10-CM

## 2019-02-07 DIAGNOSIS — Z79899 Other long term (current) drug therapy: Secondary | ICD-10-CM | POA: Diagnosis not present

## 2019-02-07 DIAGNOSIS — R1031 Right lower quadrant pain: Secondary | ICD-10-CM | POA: Diagnosis present

## 2019-02-07 DIAGNOSIS — R1011 Right upper quadrant pain: Secondary | ICD-10-CM

## 2019-02-07 LAB — COMPREHENSIVE METABOLIC PANEL
ALT: 36 U/L (ref 0–44)
AST: 24 U/L (ref 15–41)
Albumin: 4.9 g/dL (ref 3.5–5.0)
Alkaline Phosphatase: 62 U/L (ref 38–126)
Anion gap: 8 (ref 5–15)
BUN: 11 mg/dL (ref 6–20)
CO2: 27 mmol/L (ref 22–32)
Calcium: 9.5 mg/dL (ref 8.9–10.3)
Chloride: 103 mmol/L (ref 98–111)
Creatinine, Ser: 0.8 mg/dL (ref 0.44–1.00)
GFR calc Af Amer: >60 mL/min (ref >60)
Glucose, Bld: 86 mg/dL (ref 70–99)
POTASSIUM: 4 mmol/L (ref 3.5–5.1)
Sodium: 138 mmol/L (ref 135–145)
Total Bilirubin: 0.8 mg/dL (ref 0.3–1.2)
Total Protein: 8.4 g/dL — ABNORMAL HIGH (ref 6.5–8.1)

## 2019-02-07 LAB — I-STAT BETA HCG BLOOD, ED (MC, WL, AP ONLY): I-stat hCG, quantitative: <5 m[IU]/mL (ref <5)

## 2019-02-07 LAB — URINALYSIS, ROUTINE W REFLEX MICROSCOPIC
Bilirubin Urine: NEGATIVE
Glucose, UA: NEGATIVE mg/dL
Hgb urine dipstick: NEGATIVE
Ketones, ur: NEGATIVE mg/dL
Nitrite: NEGATIVE
Protein, ur: NEGATIVE mg/dL
Specific Gravity, Urine: 1.012 (ref 1.005–1.030)
Squamous Epithelial / HPF: >50 — ABNORMAL HIGH (ref 0–5)
pH: 8 (ref 5.0–8.0)

## 2019-02-07 LAB — CBC
HCT: 44.9 % (ref 36.0–46.0)
Hemoglobin: 14.6 g/dL (ref 12.0–15.0)
MCH: 30.5 pg (ref 26.0–34.0)
MCHC: 32.5 g/dL (ref 30.0–36.0)
MCV: 93.9 fL (ref 80.0–100.0)
Platelets: 364 10*3/uL (ref 150–400)
RBC: 4.78 MIL/uL (ref 3.87–5.11)
RDW: 12.6 % (ref 11.5–15.5)
WBC: 11 10*3/uL — AB (ref 4.0–10.5)
nRBC: 0 % (ref 0.0–0.2)

## 2019-02-07 LAB — LIPASE, BLOOD: Lipase: 28 U/L (ref 11–51)

## 2019-02-07 MED ORDER — KETOROLAC TROMETHAMINE 30 MG/ML IJ SOLN
30.0000 mg | Freq: Once | INTRAMUSCULAR | Status: AC
  Administered 2019-02-07: 30 mg via INTRAVENOUS
  Filled 2019-02-07: qty 1

## 2019-02-07 MED ORDER — SODIUM CHLORIDE 0.9 % IV BOLUS
1000.0000 mL | Freq: Once | INTRAVENOUS | Status: AC
  Administered 2019-02-07: 1000 mL via INTRAVENOUS

## 2019-02-07 MED ORDER — ONDANSETRON 4 MG PO TBDP: ORAL_TABLET | ORAL | 0 refills | Status: DC

## 2019-02-07 MED ORDER — HYDROCODONE-ACETAMINOPHEN 5-325 MG PO TABS: 1.0000 | ORAL_TABLET | ORAL | 0 refills | Status: DC | PRN

## 2019-02-07 MED ORDER — MORPHINE SULFATE (PF) 4 MG/ML IV SOLN
4.0000 mg | Freq: Once | INTRAVENOUS | Status: AC
  Administered 2019-02-07: 4 mg via INTRAVENOUS
  Filled 2019-02-07: qty 1

## 2019-02-07 MED ORDER — ONDANSETRON HCL 4 MG/2ML IJ SOLN
4.0000 mg | Freq: Once | INTRAMUSCULAR | Status: AC
  Administered 2019-02-07: 4 mg via INTRAVENOUS
  Filled 2019-02-07: qty 2

## 2019-02-07 MED ORDER — SODIUM CHLORIDE 0.9% FLUSH
3.0000 mL | Freq: Once | INTRAVENOUS | Status: AC
  Administered 2019-02-07: 3 mL via INTRAVENOUS

## 2019-02-07 NOTE — ED Provider Notes (Signed)
[facility] Provider Note   CSN: [id number] Arrival date & time: 02/07/19  2841    History   Chief Complaint Chief Complaint  Patient presents with   Abdominal Pain    HPI Krystal Humphrey is a 33 y.o. female history of reflux, previous duodenal ulcer, kidney stones here presenting with right lower quadrant and right flank pain.  Patient was seen at [facility] 3 days ago for abdominal pain.  She had CT abdomen pelvis that showed right intrarenal stone with no hydro.  She also had right upper quadrant ultrasound that was unremarkable.  She was thought to have a stomach ulcer and was put on Pepcid, Carafate and she states that it has not improved her pain.  She has persistent right upper quadrant and sharp right lower quadrant pain that radiated to her right flank area.  Associated with some nausea and vomiting.  Patient denies any fevers.     The history is provided by the patient.    Past Medical History:  Diagnosis Date   Anxiety    Depression    GERD (gastroesophageal reflux disease)    Hiatal hernia    History of Clostridium difficile colitis    04/ 2011   History of duodenal ulcer    2009   History of esophagitis    History of panic attacks    Nephrolithiasis    bilateral per ct 07-27-2016  L > R  (right side nonobstructive)   Urgency of urination     Patient Active Problem List   Diagnosis Date Noted   Acute sinus infection 05/26/2017   Wheezing 05/26/2017   Right foot pain 05/16/2017   Seasonal allergic rhinitis due to pollen 05/16/2017   Bilious vomiting with nausea 04/14/2017   Acute pain of right knee 11/01/2016   Nephrolithiasis 07/30/2016   Kidney stone    Loose stools 02/06/2016   Abdominal discomfort 01/19/2016   Obesity 01/07/2015   SVD (spontaneous vaginal delivery) 09/24/2011   DEPRESSIVE TYPE PSYCHOSIS 08/21/2009   ANEMIA, IRON DEFICIENCY 05/08/2009   INSECT BITE 05/07/2009   ULCER-DUODENAL  03/12/2009   ABSENCE OF MENSTRUATION 10/02/2008   EPIGASTRIC PAIN 10/01/2008   REFLUX ESOPHAGITIS 10/10/2007    Past Surgical History:  Procedure Laterality Date   CYSTOSCOPY WITH RETROGRADE PYELOGRAM, URETEROSCOPY AND STENT PLACEMENT Left 07/30/2016   Procedure: CYSTOSCOPY WITH LEFT  RETROGRADE PYELOGRAM AND LEFT STENT PLACEMENT;  Surgeon: Malen Gauze, MD;  Location: [facility];  Service: Urology;  Laterality: Left;   CYSTOSCOPY WITH RETROGRADE PYELOGRAM, URETEROSCOPY AND STENT PLACEMENT Left 08/17/2016   Procedure: CYSTOSCOPY WITH LEFT RETROGRADE  STENT EXCHANGE, URETEROSCOPY AND STONE EXTRACTION, LASER LITHOTRIPSY AND LEFT RETROGRADE PYELOGRAM;  Surgeon: Bjorn Pippin, MD;  Location: [facility] [place];  Service: Urology;  Laterality: Left;   ESOPHAGOGASTRODUODENOSCOPY  last one 03-26-2009   KNEE ARTHROSCOPY Right 03/19/2010   TONSILLECTOMY  child     OB History    Gravida  2   Para  1   Term  1   Preterm      AB  1   Living  1     SAB  1   TAB      Ectopic      Multiple      Live Births  1            Home Medications    Prior to Admission medications   Medication Sig Start Date End Date Taking? Authorizing Provider  etonogestrel (NEXPLANON) 68  MG IMPL implant 1 each by Subdermal route once.   Yes [provider]  famotidine (PEPCID) 20 MG tablet Take 1 tablet (20 mg total) by mouth 2 (two) times daily. 02/04/19  Yes Henderly, Britni A, PA-C  ondansetron (ZOFRAN ODT) 4 MG disintegrating tablet Take 1 tablet (4 mg total) by mouth every 8 (eight) hours as needed for nausea or vomiting. 02/04/19  Yes Henderly, Britni A, PA-C  sucralfate (CARAFATE) 1 g tablet Take 1 tablet (1 g total) by mouth 4 (four) times daily -  with meals and at bedtime. 02/04/19  Yes Henderly, Britni A, PA-C    Family History Family History  Problem Relation Age of Onset   Diabetes Mother    Thyroid disease Mother    Heart attack Father    Diabetes Maternal Aunt     Diabetes Maternal Uncle    COPD Maternal Grandmother    Crohn's disease Maternal Grandmother    Anesthesia problems Neg Hx    Hypotension Neg Hx    Malignant hyperthermia Neg Hx    Pseudochol deficiency Neg Hx     Social History Social History   Tobacco Use   Smoking status: Former Smoker    Packs/day: 0.25    Years: 2.00    Pack years: 0.50    Last attempt to quit: 09/20/2008    Years since quitting: 10.3   Smokeless tobacco: Never Used  Substance Use Topics   Alcohol use: Yes    Comment: occasional   Drug use: No     Allergies   Remeron [mirtazapine]; Flagyl [metronidazole]; and Tramadol   Review of Systems Review of Systems  Gastrointestinal: Positive for abdominal pain.  All other systems reviewed and are negative.    Physical Exam Updated Vital Signs BP 116/66    Pulse (!) 58    Temp 98.6 F (37 C) (Oral)    Resp 15    Ht 5\' 8"  (1.727 m)    Wt 101.6 kg    SpO2 96%    BMI 34.06 kg/m   Physical Exam Vitals signs and nursing note reviewed.  HENT:     Head: Normocephalic.     Mouth/Throat:     Mouth: Mucous membranes are moist.  Eyes:     Extraocular Movements: Extraocular movements intact.  Cardiovascular:     Rate and Rhythm: Normal rate.  Pulmonary:     Effort: Pulmonary effort is normal.     Breath sounds: Normal breath sounds.  Abdominal:     General: Abdomen is flat.     Comments: Mild RLQ and R CVAT, minimal RUQ tenderness, neg murphy's   Skin:    General: Skin is warm.     Capillary Refill: Capillary refill takes less than 2 seconds.  Neurological:     General: No focal deficit present.     Mental Status: She is alert and oriented to person, place, and time.  Psychiatric:        Mood and Affect: Mood normal.        Behavior: Behavior normal.      ED Treatments / Results  Labs (all labs ordered are listed, but only abnormal results are displayed) Labs Reviewed  COMPREHENSIVE METABOLIC PANEL - Abnormal; Notable for the  following components:      Result Value   Total Protein 8.4 (*)    All other components within normal limits  CBC - Abnormal; Notable for the following components:   WBC 11.0 (*)    All  other components within normal limits  URINALYSIS, ROUTINE W REFLEX MICROSCOPIC - Abnormal; Notable for the following components:   APPearance CLOUDY (*)    Leukocytes,Ua TRACE (*)    Bacteria, UA FEW (*)    Squamous Epithelial / LPF >50 (*)    All other components within normal limits  LIPASE, BLOOD  I-STAT BETA HCG BLOOD, ED ([facility], [facility], [facility] ONLY)    EKG None  Radiology Dg Abdomen 1 View  Result Date: 02/07/2019 CLINICAL DATA:  Right flank pain.  Kidney stone EXAM: ABDOMEN - 1 VIEW COMPARISON:  CT abdomen pelvis 02/04/2019 FINDINGS: Normal bowel gas pattern. Negative for urinary tract calculi. Small right lower pole renal calculus on CT not visualized on KUB. No bony abnormality. IMPRESSION: No urinary tract calculi identified likely due to overlying bowel gas and stool and small stone size based on recent CT. Electronically Signed   By: Marlan Palau M.D.   On: 02/07/2019 12:38   US Renal  Result Date: 02/07/2019 CLINICAL DATA:  Known right kidney stone. EXAM: RENAL / URINARY TRACT ULTRASOUND COMPLETE COMPARISON:  CT scan 02/04/2019 FINDINGS: Right Kidney: Renal measurements: 10.4 x 4.0 x 5.8 cm = volume: 125.5 mL . Normal renal cortical thickness and echogenicity without focal lesions or hydronephrosis. The small renal calculus seen on the prior CT scan is not identified for certain. Left Kidney: Renal measurements: 11.4 x 4.6 x 4.8 cm = volume: 130.5 mL. Normal renal cortical thickness and echogenicity without focal lesions or hydronephrosis. Bladder: Appears normal for degree of bladder distention. IMPRESSION: Unremarkable renal ultrasound examination. No definite renal calculi. Electronically Signed   By: Rudie Meyer M.D.   On: 02/07/2019 14:26    Procedures Procedures (including critical care  time)  Medications Ordered in ED Medications  sodium chloride flush (NS) 0.9 % injection 3 mL (3 mLs Intravenous Given 02/07/19 1152)  sodium chloride 0.9 % bolus 1,000 mL (1,000 mLs Intravenous New Bag/Given 02/07/19 1152)  ondansetron (ZOFRAN) injection 4 mg (4 mg Intravenous Given 02/07/19 1151)  ketorolac (TORADOL) 30 MG/ML injection 30 mg (30 mg Intravenous Given 02/07/19 1151)  morphine 4 MG/ML injection 4 mg (4 mg Intravenous Given 02/07/19 1356)     Initial Impression / Assessment and Plan / ED Course  I have reviewed the triage vital signs and the nursing notes.  Pertinent labs & imaging results that were available during my care of the patient were reviewed by me and considered in my medical decision making (see chart for details).       [facility] is a 33 y.o. female here with R flank pain, RLQ pain. Had recent CT that was unremarkable except R intrarenal stone and nl RUQ [place]. I wonder if the stone had moved and now she has renal colic. She also had previous duodenal ulcer that can also cause her pain but is on carafate and pepcid with no relief. Will repeat labs, LFTs, will get US renal stone.   3:02 PM Labs and UA unremarkable. US showed no hydro. She had previous duodenal ulcer. She had seen [facility] previously. Will add vicodin prn, zofran prn. Will refer back to [facility].   Final Clinical Impressions(s) / ED Diagnoses   Final diagnoses:  None    ED Discharge Orders    None       Charlynne Pander, MD 02/07/19 1505

## 2019-02-07 NOTE — Discharge Instructions (Addendum)
Continue pepcid, carafate.   Add zofran for nausea.   Take vicodin for severe pain.   You may need another endoscopy. You may have another ulcer. See GI for follow up   Return to ER if you have worse abdominal pain, vomiting, fever.

## 2019-02-07 NOTE — ED Triage Notes (Signed)
Pt states RUQ pain radiating down to her RLQ and back. Pt was seen at [facility] and not given definitive diagnosis. Pt states she cannot keep food down, denies diarrhea. Pt states she has not eaten since Friday night. Pt states she has only been able to keep down aloe water.

## 2019-02-07 NOTE — ED Notes (Signed)
Pt is alert and orinted x 4 and is verbally responsive. Pt reports 8/10 rt sided lower abdominal pain x 6 days with associated n/v.

## 2019-02-09 ENCOUNTER — Other Ambulatory Visit (HOSPITAL_COMMUNITY): Payer: Self-pay | Admitting: General Surgery

## 2019-02-09 ENCOUNTER — Other Ambulatory Visit: Payer: Self-pay | Admitting: General Surgery

## 2019-02-09 DIAGNOSIS — R1011 Right upper quadrant pain: Secondary | ICD-10-CM

## 2019-02-27 ENCOUNTER — Ambulatory Visit: Payer: 59

## 2019-03-02 ENCOUNTER — Other Ambulatory Visit: Payer: Self-pay | Admitting: General Surgery

## 2019-03-02 DIAGNOSIS — R1011 Right upper quadrant pain: Secondary | ICD-10-CM

## 2019-03-06 ENCOUNTER — Other Ambulatory Visit: Payer: Self-pay

## 2019-03-06 ENCOUNTER — Ambulatory Visit (HOSPITAL_COMMUNITY)
Admission: RE | Admit: 2019-03-06 | Discharge: 2019-03-06 | Disposition: A | Discharge status: Home / Self Care | Payer: 59 | Source: Ambulatory Visit | Attending: General Surgery | Admitting: General Surgery

## 2019-03-06 DIAGNOSIS — R1011 Right upper quadrant pain: Secondary | ICD-10-CM | POA: Diagnosis not present

## 2019-03-06 MED ORDER — TECHNETIUM TC 99M MEBROFENIN IV KIT
5.5000 | PACK | Freq: Once | INTRAVENOUS | Status: AC | PRN
  Administered 2019-03-06: 5.5 via INTRAVENOUS

## 2019-06-12 ENCOUNTER — Other Ambulatory Visit: Payer: Self-pay

## 2019-06-12 ENCOUNTER — Encounter (HOSPITAL_COMMUNITY): Payer: Self-pay | Admitting: Urgent Care

## 2019-06-12 ENCOUNTER — Ambulatory Visit (HOSPITAL_COMMUNITY)
Admission: EM | Admit: 2019-06-12 | Discharge: 2019-06-12 | Disposition: A | Discharge status: Home / Self Care | Payer: BLUE CROSS/BLUE SHIELD | Attending: Emergency Medicine | Admitting: Emergency Medicine

## 2019-06-12 DIAGNOSIS — N23 Unspecified renal colic: Secondary | ICD-10-CM | POA: Diagnosis not present

## 2019-06-12 DIAGNOSIS — F419 Anxiety disorder, unspecified: Secondary | ICD-10-CM

## 2019-06-12 LAB — POCT URINALYSIS DIP (DEVICE)
Bilirubin Urine: NEGATIVE
Glucose, UA: NEGATIVE mg/dL
Ketones, ur: NEGATIVE mg/dL
Leukocytes,Ua: NEGATIVE
Nitrite: NEGATIVE
Protein, ur: NEGATIVE mg/dL
Specific Gravity, Urine: 1.025 (ref 1.005–1.030)
Urobilinogen, UA: 0.2 mg/dL (ref 0.0–1.0)
pH: 6 (ref 5.0–8.0)

## 2019-06-12 MED ORDER — HYDROCODONE-ACETAMINOPHEN 5-325 MG PO TABS: 1.0000 | ORAL_TABLET | Freq: Four times a day (QID) | ORAL | 0 refills | Status: DC | PRN

## 2019-06-12 MED ORDER — TAMSULOSIN HCL 0.4 MG PO CAPS: 0.4000 mg | ORAL_CAPSULE | Freq: Every day | ORAL | 0 refills | Status: AC

## 2019-06-12 MED ORDER — HYDROXYZINE HCL 50 MG PO TABS: 50.0000 mg | ORAL_TABLET | Freq: Four times a day (QID) | ORAL | 1 refills | Status: DC | PRN

## 2019-06-12 NOTE — ED Provider Notes (Signed)
HPI  SUBJECTIVE:  Krystal Humphrey is a 33 y.o. female who presents with 2 issues: First she reports a "kidney stone" with right-sided low back pain that radiates down her flank into her right lower quadrant/groin for the past 2 to 3 days.  She states it is constantly dull, occasionally becomes sharp and severe.  She reports mild nausea, no vomiting, fevers.  No dysuria, urgency, frequency, cloudy or odorous urine, hematuria.  No vaginal discharge, bleeding, odor.  She has tried Tylenol PM without improvement in her symptoms.  Back pain is a little worse with bending forward.  It is not associated with movement, urination, defecation, eating or drinking.  She states that she feels as if she can completely empty her bladder.  She states this is identical to previous kidney stones.  No antipyretic in the past 4 to 6 hours.  She also reports having worsening anxiety.  Stopped Lexapro several months ago that was started by her PMD.  She states that she has follow-up with a new primary care provider next Tuesday.  She states that she has also tried Zoloft for her anxiety in the past without relief.  Past medical history of bilateral nephrolithiasis, obstructing on the left side, UTI, pyelonephritis, anxiety, peptic ulcer disease, GI bleed.  No history of appendicitis, STDs, PID, abdominal surgeries, diverticulitis.  PMD: Establishing care at [facility].  Seen in the ER twice in March for right sided abdominal/flank pain, found to have right-sided nonobstructing nephrolithiasis on CT.  Past Medical History:  Diagnosis Date  . Anxiety   . Depression   . GERD (gastroesophageal reflux disease)   . Hiatal hernia   . History of Clostridium difficile colitis    04/ 2011  . History of duodenal ulcer    2009  . History of esophagitis   . History of panic attacks   . Nephrolithiasis    bilateral per ct 07-27-2016  L > R  (right side nonobstructive)  . Urgency of urination     Past Surgical History:  Procedure  Laterality Date  . CYSTOSCOPY WITH RETROGRADE PYELOGRAM, URETEROSCOPY AND STENT PLACEMENT Left 07/30/2016   Procedure: CYSTOSCOPY WITH LEFT  RETROGRADE PYELOGRAM AND LEFT STENT PLACEMENT;  Surgeon: Malen GauzePatrick L McKenzie, MD;  Location: [facility];  Service: Urology;  Laterality: Left;  . CYSTOSCOPY WITH RETROGRADE PYELOGRAM, URETEROSCOPY AND STENT PLACEMENT Left 08/17/2016   Procedure: CYSTOSCOPY WITH LEFT RETROGRADE  STENT EXCHANGE, URETEROSCOPY AND STONE EXTRACTION, LASER LITHOTRIPSY AND LEFT RETROGRADE PYELOGRAM;  Surgeon: Bjorn PippinJohn Wrenn, MD;  [facility] [place];  Service: Urology;  Laterality: Left;  . ESOPHAGOGASTRODUODENOSCOPY  last one 03-26-2009  . KNEE ARTHROSCOPY Right 03/19/2010  . TONSILLECTOMY  child    Family History  Problem Relation Age of Onset  . Diabetes Mother   . Thyroid disease Mother   . Heart attack Father   . Diabetes Maternal Aunt   . Diabetes Maternal Uncle   . COPD Maternal Grandmother   . Crohn's disease Maternal Grandmother   . Anesthesia problems Neg Hx   . Hypotension Neg Hx   . Malignant hyperthermia Neg Hx   . Pseudochol deficiency Neg Hx     Social History   Tobacco Use  . Smoking status: Former Smoker    Packs/day: 0.25    Years: 2.00    Pack years: 0.50    Quit date: 09/20/2008    Years since quitting: 10.7  . Smokeless tobacco: Never Used  Substance Use Topics  . Alcohol use: Yes  Comment: occasional  . Drug use: No    No current facility-administered medications for this encounter.   Current Outpatient Medications:  .  etonogestrel (NEXPLANON) 68 MG IMPL implant, 1 each by Subdermal route once., Disp: , Rfl:  .  famotidine (PEPCID) 20 MG tablet, Take 1 tablet (20 mg total) by mouth 2 (two) times daily., Disp: 30 tablet, Rfl: 0 .  HYDROcodone-acetaminophen (NORCO/VICODIN) 5-325 MG tablet, Take 1-2 tablets by mouth every 6 (six) hours as needed for moderate pain., Disp: 12 tablet, Rfl: 0 .  hydrOXYzine (ATARAX/VISTARIL) 50 MG  tablet, Take 1 tablet (50 mg total) by mouth every 6 (six) hours as needed for anxiety. May take 2 tabs at night, Disp: 30 tablet, Rfl: 1 .  sucralfate (CARAFATE) 1 g tablet, Take 1 tablet (1 g total) by mouth 4 (four) times daily -  with meals and at bedtime., Disp: 40 tablet, Rfl: 0 .  tamsulosin (FLOMAX) 0.4 MG CAPS capsule, Take 1 capsule (0.4 mg total) by mouth at bedtime for 7 days., Disp: 7 capsule, Rfl: 0  Allergies  Allergen Reactions  . Remeron [Mirtazapine] Anaphylaxis  . Flagyl [Metronidazole] Nausea And Vomiting  . Lexapro [Escitalopram Oxalate]     hallucinations  . Tramadol Itching     ROS  As noted in HPI.   Physical Exam  BP 118/73 (BP Location: Left Arm) Comment (BP Location): large cuff  Pulse 75   Temp 98.3 F (36.8 C) (Oral)   Resp 20   SpO2 96%   Constitutional: Well developed, well nourished, no acute distress Eyes:  EOMI, conjunctiva normal bilaterally HENT: Normocephalic, atraumatic,mucus membranes moist Respiratory: Normal inspiratory effort Cardiovascular: Normal rate GI: nondistended, soft.  Active bowel sounds.  Positive right flank, suprapubic tenderness.  Mild right lower quadrant tenderness.  Negative Murphy.  Negative tap table test.  No guarding or rebound. Back: No CVAT.  Positive mild tenderness right paralumbar area. skin: No rash, skin intact Musculoskeletal: no deformities Neurologic: Alert & oriented x 3, no focal neuro deficits Psychiatric: Speech and behavior appropriate   ED Course   Medications - No data to display  Orders Placed This Encounter  Procedures  . Strain all urine    Standing Status:   Standing    Number of Occurrences:   1  . POCT urinalysis dip (device)    Standing Status:   Standing    Number of Occurrences:   1    Results for orders placed or performed during the hospital encounter of 06/12/19 (from the past 24 hour(s))  POCT urinalysis dip (device)     Status: Abnormal   Collection Time: 06/12/19   8:28 AM  Result Value Ref Range   Glucose, UA NEGATIVE NEGATIVE mg/dL   Bilirubin Urine NEGATIVE NEGATIVE   Ketones, ur NEGATIVE NEGATIVE mg/dL   Specific Gravity, Urine 1.025 1.005 - 1.030   Hgb urine dipstick TRACE (A) NEGATIVE   pH 6.0 5.0 - 8.0   Protein, ur NEGATIVE NEGATIVE mg/dL   Urobilinogen, UA 0.2 0.0 - 1.0 mg/dL   Nitrite NEGATIVE NEGATIVE   Leukocytes,Ua NEGATIVE NEGATIVE   No results found.  ED Clinical Impression  1. Renal colic on right side   2. Anxiety      ED Assessment/Plan  ER records reviewed.  As noted in HPI.  1.  Possible nephrolithiasis with hematuria- patient states that she sometimes gets right lower quadrant pain and tenderness with her nephrolithiasis.  However in the differential is appendicitis, ovarian cyst, PID.  her abdomen is benign right now, vitals are normal.  She does have trace hematuria.  No evidence of UTI, so will not send off for culture.  She has no GYN complaints.  We will treat for presumed nephrolithiasis/renal colic as she states that this pain is identical to previous episodes of nephrolithiasis, Flomax, 1 g of Tylenol 3-4 times a day, Norco for severe pain.  Strain all urine, push fluids.  Discussed with patient not take Tylenol and Norco but take 1 of the other.  No NSAIDs because she has a history of GI bleed and active peptic ulcers.  We discussed going to the ER for comprehensive work-up, but she is willing to try outpatient treatment first.  Gave her strict ER return precautions.  Does have tenderness in the right paralumbar area, she works in [employer] and does a lot of heavy lifting.  I suspect that her back pain is coming from a lumbar strain.  Tylenol will help with this.  2.  Anxiety.  Discussed with patient I do not generally start SSRIs in the urgent care setting, she has an appointment with [place] primary care next week.  Will give her Vistaril for temporary relief.  [place] Narcotic database reviewed for this patient, and  feel that the risk/benefit ratio today is favorable for proceeding with a prescription for controlled substance.  Last opiate prescription was a short prescription several months ago.  Discussed labs,  MDM, treatment plan, and plan for follow-up with patient. Discussed sn/sx that should prompt return to the ED. patient agrees with plan.   Meds ordered this encounter  Medications  . tamsulosin (FLOMAX) 0.4 MG CAPS capsule    Sig: Take 1 capsule (0.4 mg total) by mouth at bedtime for 7 days.    Dispense:  7 capsule    Refill:  0  . HYDROcodone-acetaminophen (NORCO/VICODIN) 5-325 MG tablet    Sig: Take 1-2 tablets by mouth every 6 (six) hours as needed for moderate pain.    Dispense:  12 tablet    Refill:  0  . hydrOXYzine (ATARAX/VISTARIL) 50 MG tablet    Sig: Take 1 tablet (50 mg total) by mouth every 6 (six) hours as needed for anxiety. May take 2 tabs at night    Dispense:  30 tablet    Refill:  1    *This clinic note was created using Scientist, clinical (histocompatibility and immunogenetics)Dragon dictation software. Therefore, there may be occasional mistakes despite careful proofreading.   ?    Domenick GongMortenson, Esthefany Herrig, MD 06/12/19 1028

## 2019-06-12 NOTE — ED Triage Notes (Signed)
Complains of pain in right back, radiating into right flank area.  Onset of pain late Saturday/early Sunday. Patient has a history of kidney stones.    Also complains of anxiety, but states they are not working.  Does not like this doctor and refuses to go back to this provider

## 2019-06-12 NOTE — Discharge Instructions (Addendum)
Push fluids.  Take 1 g of Tylenol 3-4 times a day, or you can take 1-2 Norco for severe pain.  The Flomax will help you pass the stone.  I am concerned about that right lower quadrant tenderness, so if your pain changes, gets worse, if it hurts to move or walk, if you have fevers above 100.4, or for any concerns, go immediately to the ER for comprehensive work-up including imaging.  Vistaril will help with anxiety.  It is a temporary measure.  Please follow-up with your primary care physician to discuss long-term management.

## 2019-06-20 ENCOUNTER — Encounter: Payer: Self-pay | Admitting: Registered Nurse

## 2019-06-20 ENCOUNTER — Other Ambulatory Visit: Payer: Self-pay

## 2019-06-20 ENCOUNTER — Ambulatory Visit (INDEPENDENT_AMBULATORY_CARE_PROVIDER_SITE_OTHER): Payer: 59 | Admitting: Registered Nurse

## 2019-06-20 DIAGNOSIS — F411 Generalized anxiety disorder: Secondary | ICD-10-CM | POA: Diagnosis not present

## 2019-06-20 MED ORDER — BUSPIRONE HCL 5 MG PO TABS: 5.0000 mg | ORAL_TABLET | Freq: Three times a day (TID) | ORAL | 1 refills | Status: DC

## 2019-06-20 NOTE — Progress Notes (Signed)
Established Patient Office Visit  Subjective:  Patient ID: Krystal Humphrey, female    DOB: 01-Oct-1986  Age: 33 y.o. MRN: [id number] Humphrey  CC:  Chief Complaint  Patient presents with  . Establish Care  . Anxiety    need medication management, pt states the hydroxyzine is to strong and need something different     HPI Krystal Humphrey presents for anxiety  States that lately, her anxiety has been a daily issue that interferes with her work, family, and home life. She was seen in the ED for flank pain and anxiety around 1 week ago - at the time she was given Vistaril, which she states was extremely sedating and she was unable to work because of that - she did not like it. She is hoping to address her anxiety with something else, though she is hesitant to use another daily medication after failing Lexapro, Remeron, and Zoloft.  We had a long discussion about a three-pronged approach to combat anxiety that includes CBT, medication, and coping skills. She was tearful at times throughout this conversation, and indicated that she did not feel ready for CBT at this time. She is interested in PRN medication.   Past Medical History:  Diagnosis Date  . Anxiety   . Depression   . GERD (gastroesophageal reflux disease)   . Hiatal hernia   . History of Clostridium difficile colitis    04/ 2011  . History of duodenal ulcer    2009  . History of esophagitis   . History of panic attacks   . Nephrolithiasis    bilateral per ct 07-27-2016  L > R  (right side nonobstructive)  . Urgency of urination     Past Surgical History:  Procedure Laterality Date  . CYSTOSCOPY WITH RETROGRADE PYELOGRAM, URETEROSCOPY AND STENT PLACEMENT Left 07/30/2016   Procedure: CYSTOSCOPY WITH LEFT  RETROGRADE PYELOGRAM AND LEFT STENT PLACEMENT;  Surgeon: Krystal GauzePatrick L McKenzie, MD;  Location: [facility];  Service: Urology;  Laterality: Left;  . CYSTOSCOPY WITH RETROGRADE PYELOGRAM, URETEROSCOPY AND STENT PLACEMENT Left 08/17/2016   Procedure: CYSTOSCOPY WITH LEFT RETROGRADE  STENT EXCHANGE, URETEROSCOPY AND STONE EXTRACTION, LASER LITHOTRIPSY AND LEFT RETROGRADE PYELOGRAM;  Surgeon: Krystal PippinJohn Wrenn, MD;  [facility] [place];  Service: Urology;  Laterality: Left;  . ESOPHAGOGASTRODUODENOSCOPY  last one 03-26-2009  . KNEE ARTHROSCOPY Right 03/19/2010  . TONSILLECTOMY  child    Family History  Problem Relation Age of Onset  . Diabetes Mother   . Thyroid disease Mother   . Heart attack Father   . Diabetes Maternal Aunt   . Diabetes Maternal Uncle   . COPD Maternal Grandmother   . Crohn's disease Maternal Grandmother   . Anesthesia problems Neg Hx   . Hypotension Neg Hx   . Malignant hyperthermia Neg Hx   . Pseudochol deficiency Neg Hx     Social History   Socioeconomic History  . Marital status: Single    Spouse name: Not on file  . Number of children: 1  . Years of education: 1912  . Highest education level: Not on file  Occupational History  . Occupation: unemployed  Social Needs  . Financial resource strain: Not on file  . Food insecurity    Worry: Not on file    Inability: Not on file  . Transportation needs    Medical: Not on file    Non-medical: Not on file  Tobacco Use  . Smoking status: Former Smoker    Packs/day: 0.25  Years: 2.00    Pack years: 0.50    Quit date: 09/20/2008    Years since quitting: 10.7  . Smokeless tobacco: Never Used  Substance and Sexual Activity  . Alcohol use: Yes    Comment: occasional  . Drug use: No  . Sexual activity: Not on file    Comment: implant placed 09/ 2014  Lifestyle  . Physical activity    Days per week: Not on file    Minutes per session: Not on file  . Stress: Not on file  Relationships  . Social Musicianconnections    Talks on phone: Not on file    Gets together: Not on file    Attends religious service: Not on file    Active member of club or organization: Not on file    Attends meetings of clubs or organizations: Not on file     Relationship status: Not on file  . Intimate partner violence    Fear of current or ex partner: Not on file    Emotionally abused: Not on file    Physically abused: Not on file    Forced sexual activity: Not on file  Other Topics Concern  . Not on file  Social History Narrative   [place] Currently resides in a house with her boyfriend and daughter. 2 dogs. Fun: Softball, bowl   Denies religious beliefs that would effect health care.     Outpatient Medications Prior to Visit  Medication Sig Dispense Refill  . etonogestrel (NEXPLANON) 68 MG IMPL implant 1 each by Subdermal route once.    Marland Kitchen. PROAIR HFA 108 (90 Base) MCG/ACT inhaler 1 2 PUFFS INHALE EVERY 4 6 HOURS AS NEEDED AS NEEDED FOR COUGH OR SHORTNESS OF BREATH    . sucralfate (CARAFATE) 1 g tablet Take 1 tablet (1 g total) by mouth 4 (four) times daily -  with meals and at bedtime. 40 tablet 0  . hydrOXYzine (ATARAX/VISTARIL) 50 MG tablet Take 1 tablet (50 mg total) by mouth every 6 (six) hours as needed for anxiety. May take 2 tabs at night 30 tablet 1  . famotidine (PEPCID) 20 MG tablet Take 1 tablet (20 mg total) by mouth 2 (two) times daily. 30 tablet 0  . HYDROcodone-acetaminophen (NORCO/VICODIN) 5-325 MG tablet Take 1-2 tablets by mouth every 6 (six) hours as needed for moderate pain. 12 tablet 0   No facility-administered medications prior to visit.     Allergies  Allergen Reactions  . Remeron [Mirtazapine] Anaphylaxis  . Flagyl [Metronidazole] Nausea And Vomiting  . Lexapro [Escitalopram Oxalate]     hallucinations  . Tramadol Itching    ROS Review of Systems  Constitutional: Negative.   HENT: Negative.   Eyes: Negative.   Respiratory: Negative.   Cardiovascular: Negative.   Gastrointestinal: Negative.   Endocrine: Negative.   Genitourinary: Negative.   Musculoskeletal: Negative.   Skin: Negative.   Allergic/Immunologic: Negative.   Neurological: Negative.   Hematological: Negative.    Psychiatric/Behavioral: Positive for sleep disturbance. The patient is nervous/anxious.   All other systems reviewed and are negative.     Objective:    Physical Exam  Constitutional: She is oriented to person, place, and time. She appears well-developed and well-nourished. She appears distressed.  Cardiovascular: Normal rate and regular rhythm.  Pulmonary/Chest: Effort normal. No respiratory distress.  Neurological: She is alert and oriented to person, place, and time.  Skin: She is not diaphoretic.  Psychiatric: Her speech is normal and behavior  is normal. Judgment and thought content normal. Her mood appears anxious. Cognition and memory are normal. She exhibits a depressed mood. She expresses no homicidal and no suicidal ideation.  Nursing note and vitals reviewed.   BP 132/81   Pulse 87   Temp 98.8 F (37.1 C) (Oral)   Resp 16   Ht 5' 7.72" (1.72 m)   Wt 239 lb (108.4 kg)   SpO2 97%   BMI 36.64 kg/m  Wt Readings from Last 3 Encounters:  06/20/19 239 lb (108.4 kg)  02/07/19 224 lb (101.6 kg)  07/24/18 229 lb (103.9 kg)     There are no preventive care reminders to display for this patient.  There are no preventive care reminders to display for this patient.  Lab Results  Component Value Date   TSH 1.77 05/17/2018   Lab Results  Component Value Date   WBC 11.0 (H) 02/07/2019   HGB 14.6 02/07/2019   HCT 44.9 02/07/2019   MCV 93.9 02/07/2019   PLT 364 02/07/2019   Lab Results  Component Value Date   NA 138 02/07/2019   K 4.0 02/07/2019   CO2 27 02/07/2019   GLUCOSE 86 02/07/2019   BUN 11 02/07/2019   CREATININE 0.80 02/07/2019   BILITOT 0.8 02/07/2019   ALKPHOS 62 02/07/2019   AST 24 02/07/2019   ALT 36 02/07/2019   PROT 8.4 (H) 02/07/2019   ALBUMIN 4.9 02/07/2019   CALCIUM 9.5 02/07/2019   ANIONGAP 8 02/07/2019   GFR 74.53 05/17/2018   No results found for: CHOL No results found for: HDL No results found for: LDLCALC No results found for:  TRIG No results found for: CHOLHDL No results found for: HGBA1C    Assessment & Plan:   Problem List Items Addressed This Visit      Other   Generalized anxiety disorder   Relevant Medications   busPIRone (BUSPAR) 5 MG tablet      Meds ordered this encounter  Medications  . busPIRone (BUSPAR) 5 MG tablet    Sig: Take 1 tablet (5 mg total) by mouth 3 (three) times daily. As needed    Dispense:  90 tablet    Refill:  1    Order Specific Question:   Supervising Provider    Answer:   Forrest Moron [id number]    Follow-up: Return in about 6 weeks (around 08/01/2019).   PLAN:  Start on BuSpar 5mg  PO tid PRN for anxiety. This is not sedating as Vistaril is. There is flexibility in dosing. As a PRN, we are optimistic it will help limit her anxiety on "bad days".   6 week med check up - will discuss how well the BuSpar is working and further explore a daily medication, as well as CBT. Wellbutrin is worth consideration for daily medication, as this patient is a former smoker and is interested in losing weight.   Otherwise, we will postpone CPE and labs until we have a better handle on her anxiety. This hopefully will happen in upcoming months. Pt knows that she can contact our clinic via phone or MyChart with any questions, comments, or concerns.  Patient encouraged to call clinic with any questions, comments, or concerns.   Maximiano Coss, NP

## 2019-06-20 NOTE — Patient Instructions (Signed)
° ° ° °  If you have lab work done today you will be contacted with your lab results within the next 2 weeks.  If you have not heard from us then please contact us. The fastest way to get your results is to register for My Chart. ° ° °IF you received an x-ray today, you will receive an invoice from [facility]. Please contact [facility] at [phone] with questions or concerns regarding your invoice.  ° °IF you received labwork today, you will receive an invoice from [facility]. Please contact [facility] at [phone] with questions or concerns regarding your invoice.  ° °Our billing staff will not be able to assist you with questions regarding bills from these companies. ° °You will be contacted with the lab results as soon as they are available. The fastest way to get your results is to activate your My Chart account. Instructions are located on the last page of this paperwork. If you have not heard from us regarding the results in 2 weeks, please contact this office. °  ° ° ° °

## 2019-07-13 ENCOUNTER — Encounter: Payer: Self-pay | Admitting: Registered Nurse

## 2019-07-18 ENCOUNTER — Other Ambulatory Visit: Payer: Self-pay | Admitting: Registered Nurse

## 2019-07-18 ENCOUNTER — Encounter: Payer: Self-pay | Admitting: Registered Nurse

## 2019-07-18 DIAGNOSIS — E663 Overweight: Secondary | ICD-10-CM

## 2019-07-18 NOTE — Progress Notes (Signed)
Referral to medical weight management Pt has been on phentermine in past, interested in restarting  Krystal Ruddy, NP

## 2019-08-01 ENCOUNTER — Other Ambulatory Visit: Payer: Self-pay

## 2019-08-01 ENCOUNTER — Encounter: Payer: Self-pay | Admitting: Registered Nurse

## 2019-08-01 ENCOUNTER — Ambulatory Visit (INDEPENDENT_AMBULATORY_CARE_PROVIDER_SITE_OTHER): Payer: 59 | Admitting: Registered Nurse

## 2019-08-01 VITALS — BP 126/72 | HR 105 | Temp 98.4°F | Resp 16 | Wt 241.0 lb

## 2019-08-01 DIAGNOSIS — F411 Generalized anxiety disorder: Secondary | ICD-10-CM

## 2019-08-01 MED ORDER — TRAZODONE HCL 50 MG PO TABS: 25.0000 mg | ORAL_TABLET | Freq: Every evening | ORAL | 0 refills | Status: DC | PRN

## 2019-08-01 MED ORDER — BUPROPION HCL ER (SR) 150 MG PO TB12: 150.0000 mg | ORAL_TABLET | Freq: Every day | ORAL | 0 refills | Status: DC

## 2019-08-01 NOTE — Progress Notes (Signed)
Established Patient Office Visit  Subjective:  Patient ID: Krystal Humphrey, female    DOB: 1986/08/14  Age: 33 y.o. MRN: [id number]  CC:  Chief Complaint  Patient presents with  . Anxiety    follow-up     HPI Krystal Humphrey presents for continuing monitoring of her anxiety   She reports today has been a bad day, but overall she is feeling better and is using the Buspar 2-3 times each week, particularly before bed. She is still having trouble sleeping - she feels that this is at the root of a lot of her anxiety. She acknowledges that she does snore and is interested in a sleep study in the future, however, she usually lies awake 2-4 hours before falling asleep. She is practicing good sleep hygiene. She is exhausted by the end of each day.  Otherwise, interested in weight loss. States she has gained weight since a rough breakup.   Past Medical History:  Diagnosis Date  . Anxiety   . Depression   . GERD (gastroesophageal reflux disease)   . Hiatal hernia   . History of Clostridium difficile colitis    04/ 2011  . History of duodenal ulcer    2009  . History of esophagitis   . History of panic attacks   . Nephrolithiasis    bilateral per ct 07-27-2016  L > R  (right side nonobstructive)  . Urgency of urination     Past Surgical History:  Procedure Laterality Date  . CYSTOSCOPY WITH RETROGRADE PYELOGRAM, URETEROSCOPY AND STENT PLACEMENT Left 07/30/2016   Procedure: CYSTOSCOPY WITH LEFT  RETROGRADE PYELOGRAM AND LEFT STENT PLACEMENT;  Surgeon: Malen GauzePatrick L McKenzie, MD;  Location: [facility];  Service: Urology;  Laterality: Left;  . CYSTOSCOPY WITH RETROGRADE PYELOGRAM, URETEROSCOPY AND STENT PLACEMENT Left 08/17/2016   Procedure: CYSTOSCOPY WITH LEFT RETROGRADE  STENT EXCHANGE, URETEROSCOPY AND STONE EXTRACTION, LASER LITHOTRIPSY AND LEFT RETROGRADE PYELOGRAM;  Surgeon: Bjorn PippinJohn Wrenn, MD;  [facility];  Service: Urology;  Laterality: Left;  . ESOPHAGOGASTRODUODENOSCOPY   last one 03-26-2009  . KNEE ARTHROSCOPY Right 03/19/2010  . TONSILLECTOMY  child    Family History  Problem Relation Age of Onset  . Diabetes Mother   . Thyroid disease Mother   . Heart attack Father   . Diabetes Maternal Aunt   . Diabetes Maternal Uncle   . COPD Maternal Grandmother   . Crohn's disease Maternal Grandmother   . Anesthesia problems Neg Hx   . Hypotension Neg Hx   . Malignant hyperthermia Neg Hx   . Pseudochol deficiency Neg Hx     Social History   Socioeconomic History  . Marital status: Single    Spouse name: Not on file  . Number of children: 1  . Years of education: 6412  . Highest education level: Not on file  Occupational History  . Occupation: unemployed  Social Needs  . Financial resource strain: Not on file  . Food insecurity    Worry: Not on file    Inability: Not on file  . Transportation needs    Medical: Not on file    Non-medical: Not on file  Tobacco Use  . Smoking status: Former Smoker    Packs/day: 0.25    Years: 2.00    Pack years: 0.50    Quit date: 09/20/2008    Years since quitting: 10.8  . Smokeless tobacco: Never Used  Substance and Sexual Activity  . Alcohol use: Yes    Comment: occasional  .  Drug use: No  . Sexual activity: Not on file    Comment: implant placed 09/ 2014  Lifestyle  . Physical activity    Days per week: Not on file    Minutes per session: Not on file  . Stress: Not on file  Relationships  . Social Musician on phone: Not on file    Gets together: Not on file    Attends religious service: Not on file    Active member of club or organization: Not on file    Attends meetings of clubs or organizations: Not on file    Relationship status: Not on file  . Intimate partner violence    Fear of current or ex partner: Not on file    Emotionally abused: Not on file    Physically abused: Not on file    Forced sexual activity: Not on file  Other Topics Concern  . Not on file  Social History  Narrative   Born and raised in [place], [place]. Currently resides in a house with her boyfriend and daughter. 2 dogs. Fun: Softball, bowl   Denies religious beliefs that would effect health care.     Outpatient Medications Prior to Visit  Medication Sig Dispense Refill  . busPIRone (BUSPAR) 5 MG tablet Take 1 tablet (5 mg total) by mouth 3 (three) times daily. As needed 90 tablet 1  . etonogestrel (NEXPLANON) 68 MG IMPL implant 1 each by Subdermal route once.    Marland Kitchen PROAIR HFA 108 (90 Base) MCG/ACT inhaler 1 2 PUFFS INHALE EVERY 4 6 HOURS AS NEEDED AS NEEDED FOR COUGH OR SHORTNESS OF BREATH    . sucralfate (CARAFATE) 1 g tablet Take 1 tablet (1 g total) by mouth 4 (four) times daily -  with meals and at bedtime. 40 tablet 0   No facility-administered medications prior to visit.     Allergies  Allergen Reactions  . Remeron [Mirtazapine] Anaphylaxis  . Flagyl [Metronidazole] Nausea And Vomiting  . Lexapro [Escitalopram Oxalate]     hallucinations  . Tramadol Itching    ROS Review of Systems  Constitutional: Negative.   HENT: Negative.   Eyes: Negative.   Respiratory: Negative.   Cardiovascular: Negative.   Gastrointestinal: Negative.   Endocrine: Negative.   Genitourinary: Negative.   Musculoskeletal: Negative.   Skin: Negative.   Allergic/Immunologic: Negative.   Neurological: Negative.   Hematological: Negative.   Psychiatric/Behavioral: Positive for sleep disturbance. Negative for self-injury and suicidal ideas. The patient is nervous/anxious.       Objective:    Physical Exam  Constitutional: She is oriented to person, place, and time. She appears well-developed and well-nourished. No distress.  Cardiovascular: Normal rate and regular rhythm.  Pulmonary/Chest: Effort normal. No respiratory distress.  Neurological: She is alert and oriented to person, place, and time.  Skin: Skin is warm and dry. No rash noted. She is not diaphoretic. No erythema. No pallor.   Psychiatric: Her speech is normal and behavior is normal. Judgment and thought content normal. Her mood appears anxious. Her affect is not angry, not blunt, not labile and not inappropriate. Thought content is not paranoid and not delusional. Cognition and memory are normal. She exhibits a depressed mood. She expresses no homicidal and no suicidal ideation. She expresses no suicidal plans and no homicidal plans.  Nursing note and vitals reviewed.   BP 126/72   Pulse (!) 105   Temp 98.4 F (36.9 C) (Oral)   Resp 16   Wt  241 lb (109.3 kg)   SpO2 100%   BMI 36.95 kg/m  Wt Readings from Last 3 Encounters:  08/01/19 241 lb (109.3 kg)  06/20/19 239 lb (108.4 kg)  02/07/19 224 lb (101.6 kg)     Health Maintenance Due  Topic Date Due  . INFLUENZA VACCINE  06/30/2019    There are no preventive care reminders to display for this patient.  Lab Results  Component Value Date   TSH 1.77 05/17/2018   Lab Results  Component Value Date   WBC 11.0 (H) 02/07/2019   HGB 14.6 02/07/2019   HCT 44.9 02/07/2019   MCV 93.9 02/07/2019   PLT 364 02/07/2019   Lab Results  Component Value Date   NA 138 02/07/2019   K 4.0 02/07/2019   CO2 27 02/07/2019   GLUCOSE 86 02/07/2019   BUN 11 02/07/2019   CREATININE 0.80 02/07/2019   BILITOT 0.8 02/07/2019   ALKPHOS 62 02/07/2019   AST 24 02/07/2019   ALT 36 02/07/2019   PROT 8.4 (H) 02/07/2019   ALBUMIN 4.9 02/07/2019   CALCIUM 9.5 02/07/2019   ANIONGAP 8 02/07/2019   GFR 74.53 05/17/2018   No results found for: CHOL No results found for: HDL No results found for: LDLCALC No results found for: TRIG No results found for: CHOLHDL No results found for: HGBA1C    Assessment & Plan:   Problem List Items Addressed This Visit      Other   Generalized anxiety disorder - Primary   Relevant Medications   buPROPion (WELLBUTRIN SR) 150 MG 12 hr tablet   traZODone (DESYREL) 50 MG tablet      Meds ordered this encounter  Medications  .  buPROPion (WELLBUTRIN SR) 150 MG 12 hr tablet    Sig: Take 1 tablet (150 mg total) by mouth daily.    Dispense:  90 tablet    Refill:  0    Order Specific Question:   Supervising Provider    Answer:   Delia Chimes A [id number]  . traZODone (DESYREL) 50 MG tablet    Sig: Take 0.5-1 tablets (25-50 mg total) by mouth at bedtime as needed for sleep.    Dispense:  90 tablet    Refill:  0    Order Specific Question:   Supervising Provider    Answer:   Forrest Moron [id number]    Follow-up: No follow-ups on file.   PLAN  Start bupropion 150mg  PO qd - take in mornings. Follow up in 6 weeks. May titrate up to 300 mg PO qd at that time  Trazodone 25-50mg  PO qpm as needed before bed. Do not take too close to bupropion. Continue practicing good sleep hygiene. Do not take this medication prior to driving or operating heavy machinery. Make sure you have 8 hours set aside for sleep before taking this medication.  Patient encouraged to call clinic with any questions, comments, or concerns.   Maximiano Coss, NP

## 2019-08-01 NOTE — Patient Instructions (Signed)
° ° ° °  If you have lab work done today you will be contacted with your lab results within the next 2 weeks.  If you have not heard from us then please contact us. The fastest way to get your results is to register for My Chart. ° ° °IF you received an x-ray today, you will receive an invoice from [facility]. Please contact [facility] at [phone] with questions or concerns regarding your invoice.  ° °IF you received labwork today, you will receive an invoice from [facility]. Please contact [facility] at [phone] with questions or concerns regarding your invoice.  ° °Our billing staff will not be able to assist you with questions regarding bills from these companies. ° °You will be contacted with the lab results as soon as they are available. The fastest way to get your results is to activate your My Chart account. Instructions are located on the last page of this paperwork. If you have not heard from us regarding the results in 2 weeks, please contact this office. °  ° ° ° °

## 2019-08-29 ENCOUNTER — Ambulatory Visit (INDEPENDENT_AMBULATORY_CARE_PROVIDER_SITE_OTHER): Payer: 59 | Admitting: Registered Nurse

## 2019-08-29 ENCOUNTER — Other Ambulatory Visit: Payer: Self-pay

## 2019-08-29 DIAGNOSIS — Z23 Encounter for immunization: Secondary | ICD-10-CM

## 2019-08-30 ENCOUNTER — Encounter: Payer: Self-pay | Admitting: Emergency Medicine

## 2019-08-30 ENCOUNTER — Telehealth: Payer: Self-pay | Admitting: *Deleted

## 2019-08-30 ENCOUNTER — Telehealth (INDEPENDENT_AMBULATORY_CARE_PROVIDER_SITE_OTHER): Payer: 59 | Admitting: Emergency Medicine

## 2019-08-30 VITALS — Ht 68.0 in | Wt 240.0 lb

## 2019-08-30 DIAGNOSIS — H0100A Unspecified blepharitis right eye, upper and lower eyelids: Secondary | ICD-10-CM

## 2019-08-30 MED ORDER — PREDNISONE 20 MG PO TABS: 40.0000 mg | ORAL_TABLET | Freq: Every day | ORAL | 0 refills | Status: AC

## 2019-08-30 MED ORDER — DOXYCYCLINE HYCLATE 100 MG PO TABS: 100.0000 mg | ORAL_TABLET | Freq: Two times a day (BID) | ORAL | 0 refills | Status: AC

## 2019-08-30 NOTE — Telephone Encounter (Signed)
Called patient to triage for appointment. No answer left message in voice mail to call back.

## 2019-08-30 NOTE — Progress Notes (Signed)
Called patient to triage for appointment. Patient states right side of face and eye is swollen, the eyelid is painful. 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

## 2019-08-30 NOTE — Progress Notes (Signed)
Telemedicine Encounter- SOAP NOTE Established Patient  This video telephone through Doxy me encounter was conducted with the patient's (or proxy's) verbal consent via video audio telecommunications: yes/no: Yes Patient was instructed to have this encounter in a suitably private space; and to only have persons present to whom they give permission to participate. In addition, patient identity was confirmed by use of name plus two identifiers (DOB and address).  I discussed the limitations, risks, security and privacy concerns of performing an evaluation and management service by telephone and the availability of in person appointments. I also discussed with the patient that there may be a patient responsible charge related to this service. The patient expressed understanding and agreed to proceed.  I spent a total of TIME; 0 MIN TO 60 MIN: 15 minutes talking with the patient or their proxy.  No chief complaint on file.  Swelling right eyelid Subjective   Krystal Humphrey is a 33 y.o. female established patient. Telephone visit today complaining of swollen right eyelid since last night.  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  Denies red eye or discharge.  No other associated symptoms.  HPI   Patient Active Problem List   Diagnosis Date Noted  . Generalized anxiety disorder 06/20/2019  . Obesity 01/07/2015    Past Medical History:  Diagnosis Date  . Anxiety   . Depression   . GERD (gastroesophageal reflux disease)   . Hiatal hernia   . History of Clostridium difficile colitis    04/ 2011  . History of duodenal ulcer    2009  . History of esophagitis   . History of panic attacks   . Nephrolithiasis    bilateral per ct 07-27-2016  L > R  (right side nonobstructive)  . Urgency of urination     Current Outpatient Medications  Medication Sig Dispense Refill  . buPROPion (WELLBUTRIN SR) 150 MG 12 hr tablet Take 1 tablet (150 mg total) by mouth daily. 90 tablet 0  . busPIRone  (BUSPAR) 5 MG tablet Take 1 tablet (5 mg total) by mouth 3 (three) times daily. As needed 90 tablet 1  . etonogestrel (NEXPLANON) 68 MG IMPL implant 1 each by Subdermal route once.    . traZODone (DESYREL) 50 MG tablet Take 0.5-1 tablets (25-50 mg total) by mouth at bedtime as needed for sleep. 90 tablet 0  . PROAIR HFA 108 (90 Base) MCG/ACT inhaler 1 2 PUFFS INHALE EVERY 4 6 HOURS AS NEEDED AS NEEDED FOR COUGH OR SHORTNESS OF BREATH     No current facility-administered medications for this visit.     Allergies  Allergen Reactions  . Remeron [Mirtazapine] Anaphylaxis  . Flagyl [Metronidazole] Nausea And Vomiting  . Lexapro [Escitalopram Oxalate]     hallucinations  . Tramadol Itching    Social History   Socioeconomic History  . Marital status: Single    Spouse name: Not on file  . Number of children: 1  . Years of education: 6212  . Highest education level: Not on file  Occupational History  . Occupation: unemployed  Social Needs  . Financial resource strain: Not on file  . Food insecurity    Worry: Not on file    Inability: Not on file  . Transportation needs    Medical: Not on file    Non-medical: Not on file  Tobacco Use  . Smoking status: Former Smoker    Packs/day: 0.25    Years: 2.00    Pack years: 0.50  Quit date: 09/20/2008    Years since quitting: 10.9  . Smokeless tobacco: Never Used  Substance and Sexual Activity  . Alcohol use: Yes    Comment: occasional  . Drug use: No  . Sexual activity: Not on file    Comment: implant placed 09/ 2014  Lifestyle  . Physical activity    Days per week: Not on file    Minutes per session: Not on file  . Stress: Not on file  Relationships  . Social Musician on phone: Not on file    Gets together: Not on file    Attends religious service: Not on file    Active member of club or organization: Not on file    Attends meetings of clubs or organizations: Not on file    Relationship status: Not on file  .  Intimate partner violence    Fear of current or ex partner: Not on file    Emotionally abused: Not on file    Physically abused: Not on file    Forced sexual activity: Not on file  Other Topics Concern  . Not on file  Social History Narrative   Born and raised in [place], [place]. Currently resides in a house with her boyfriend and daughter. 2 dogs. Fun: Softball, bowl   Denies religious beliefs that would effect health care.     Review of Systems  Constitutional: Negative.  Negative for chills and fever.  HENT: Negative.  Negative for congestion and sore throat.   Eyes: Negative for blurred vision, double vision, photophobia, pain, discharge and redness.       Right eyelid swelling  Respiratory: Negative.  Negative for cough and shortness of breath.   Cardiovascular: Negative.  Negative for chest pain and palpitations.  Gastrointestinal: Negative.  Negative for abdominal pain, diarrhea, nausea and vomiting.  Musculoskeletal: Negative.  Negative for myalgias.  Skin: Negative.  Negative for rash.  Neurological: Negative.  Negative for dizziness and headaches.  All other systems reviewed and are negative.   Objective  Physical Exam Constitutional:      Appearance: Normal appearance.  HENT:     Head: Normocephalic.  Eyes:     Extraocular Movements: Extraocular movements intact.     Conjunctiva/sclera: Conjunctivae normal.     Comments: Edema of both upper and lower eyelids right eye.  Able to move eyeball without pain or difficulty.  Neck:     Musculoskeletal: Normal range of motion.  Pulmonary:     Effort: Pulmonary effort is normal.  Neurological:     Mental Status: She is alert and oriented to person, place, and time.  Psychiatric:        Mood and Affect: Mood normal.        Behavior: Behavior normal.    Vitals as reported by the patient: Today's Vitals   08/30/19 1055  Weight: 240 lb (108.9 kg)  Height: 5\' 8"  (1.727 m)    There are no diagnoses linked to this  encounter.  Diagnoses and all orders for this visit:  Blepharitis of both upper and lower eyelid of right eye, unspecified type -     predniSONE (DELTASONE) 20 MG tablet; Take 2 tablets (40 mg total) by mouth daily with breakfast for 5 days. -     doxycycline (VIBRA-TABS) 100 MG tablet; Take 1 tablet (100 mg total) by mouth 2 (two) times daily for 7 days.  Advised to apply warm compresses every 3-4 hours for the next 2 days.  Take medications as prescribed.  Advil as needed for pain and inflammation.  Advised to contact the office if no better in 2 to 3 days.   I discussed the assessment and treatment plan with the patient. The patient was provided an opportunity to ask questions and all were answered. The patient agreed with the plan and demonstrated an understanding of the instructions.   The patient was advised to call back or seek an in-person evaluation if the symptoms worsen or if the condition fails to improve as anticipated.  I provided 15 minutes of non-face-to-face time during this encounter.  Horald Pollen, MD  Primary Care at [facility]

## 2019-09-11 ENCOUNTER — Ambulatory Visit (INDEPENDENT_AMBULATORY_CARE_PROVIDER_SITE_OTHER): Payer: 59 | Admitting: Registered Nurse

## 2019-09-11 ENCOUNTER — Encounter: Payer: Self-pay | Admitting: Registered Nurse

## 2019-09-11 ENCOUNTER — Other Ambulatory Visit: Payer: Self-pay

## 2019-09-11 DIAGNOSIS — F411 Generalized anxiety disorder: Secondary | ICD-10-CM

## 2019-09-11 MED ORDER — BUSPIRONE HCL 5 MG PO TABS: 5.0000 mg | ORAL_TABLET | Freq: Three times a day (TID) | ORAL | 1 refills | Status: DC

## 2019-09-11 MED ORDER — BUPROPION HCL ER (XL) 300 MG PO TB24: 300.0000 mg | ORAL_TABLET | Freq: Every day | ORAL | 0 refills | Status: DC

## 2019-09-11 MED ORDER — LORAZEPAM 0.5 MG PO TABS: 0.5000 mg | ORAL_TABLET | Freq: Two times a day (BID) | ORAL | 1 refills | Status: DC | PRN

## 2019-09-11 NOTE — Progress Notes (Signed)
Established Patient Office Visit  Subjective:  Patient ID: Krystal Humphrey, female    DOB: [birth year]  Age: 33 y.o. MRN: [id number]  CC:  Chief Complaint  Patient presents with  . Medication Management    6 week follow-up on medication, she states the Wellbutrin is making her feel tired and little energy and the Trazodone is working some so she think she should up the dose.     HPI [name] presents for med check  Started on Wellbutrin 150mg  XR PO qd in conjunction with Buspar 5mg  PO tid PRN and trazodone 25-50mg  PO qhs. She reports good effect. Feels well most days. Has only missed one dose. Reports she is still not quite where she would like to be. States that her sleep hygiene is poor and she is still having trouble falling asleep, not sure if trazodone is helpful.  Otherwise, in better spirits today overall. Reports she has lost weight, which she is happy with.   Past Medical History:  Diagnosis Date  . Anxiety   . Depression   . GERD (gastroesophageal reflux disease)   . Hiatal hernia   . History of Clostridium difficile colitis    04/ 2011  . History of duodenal ulcer    2009  . History of esophagitis   . History of panic attacks   . Nephrolithiasis    bilateral per ct 07-27-2016  L > R  (right side nonobstructive)  . Urgency of urination     Past Surgical History:  Procedure Laterality Date  . CYSTOSCOPY WITH RETROGRADE PYELOGRAM, URETEROSCOPY AND STENT PLACEMENT Left 07/30/2016   Procedure: CYSTOSCOPY WITH LEFT  RETROGRADE PYELOGRAM AND LEFT STENT PLACEMENT;  Surgeon: 07-29-2016, MD;  Location: [facility];  Service: Urology;  Laterality: Left;  . CYSTOSCOPY WITH RETROGRADE PYELOGRAM, URETEROSCOPY AND STENT PLACEMENT Left 08/17/2016   Procedure: CYSTOSCOPY WITH LEFT RETROGRADE  STENT EXCHANGE, URETEROSCOPY AND STONE EXTRACTION, LASER LITHOTRIPSY AND LEFT RETROGRADE PYELOGRAM;  Surgeon: Malen Gauze, MD;  Location: [facility];  Service: Urology;   Laterality: Left;  . ESOPHAGOGASTRODUODENOSCOPY  last one 03-26-2009  . KNEE ARTHROSCOPY Right 03/19/2010  . TONSILLECTOMY  child    Family History  Problem Relation Age of Onset  . Diabetes Mother   . Thyroid disease Mother   . Heart attack Father   . Diabetes Maternal Aunt   . Diabetes Maternal Uncle   . COPD Maternal Grandmother   . Crohn's disease Maternal Grandmother   . Anesthesia problems Neg Hx   . Hypotension Neg Hx   . Malignant hyperthermia Neg Hx   . Pseudochol deficiency Neg Hx     Social History   Socioeconomic History  . Marital status: Single    Spouse name: Not on file  . Number of children: 1  . Years of education: 43  . Highest education level: Not on file  Occupational History  . Occupation: unemployed  Social Needs  . Financial resource strain: Not on file  . Food insecurity    Worry: Not on file    Inability: Not on file  . Transportation needs    Medical: Not on file    Non-medical: Not on file  Tobacco Use  . Smoking status: Former Smoker    Packs/day: 0.25    Years: 2.00    Pack years: 0.50    Quit date: 09/20/2008    Years since quitting: 10.9  . Smokeless tobacco: Never Used  Substance and Sexual Activity  . Alcohol  use: Yes    Comment: occasional  . Drug use: No  . Sexual activity: Not on file    Comment: implant placed 09/ 2014  Lifestyle  . Physical activity    Days per week: Not on file    Minutes per session: Not on file  . Stress: Not on file  Relationships  . Social Herbalist on phone: Not on file    Gets together: Not on file    Attends religious service: Not on file    Active member of club or organization: Not on file    Attends meetings of clubs or organizations: Not on file    Relationship status: Not on file  . Intimate partner violence    Fear of current or ex partner: Not on file    Emotionally abused: Not on file    Physically abused: Not on file    Forced sexual activity: Not on file  Other  Topics Concern  . Not on file  Social History Narrative   Born and raised in [place], [place]. Currently resides in a house with her boyfriend and daughter. 2 dogs. Fun: Softball, bowl   Denies religious beliefs that would effect health care.     Outpatient Medications Prior to Visit  Medication Sig Dispense Refill  . etonogestrel (NEXPLANON) 68 MG IMPL implant 1 each by Subdermal route once.    Marland Kitchen PROAIR HFA 108 (90 Base) MCG/ACT inhaler 1 2 PUFFS INHALE EVERY 4 6 HOURS AS NEEDED AS NEEDED FOR COUGH OR SHORTNESS OF BREATH    . buPROPion (WELLBUTRIN SR) 150 MG 12 hr tablet Take 1 tablet (150 mg total) by mouth daily. 90 tablet 0  . busPIRone (BUSPAR) 5 MG tablet Take 1 tablet (5 mg total) by mouth 3 (three) times daily. As needed 90 tablet 1  . traZODone (DESYREL) 50 MG tablet Take 0.5-1 tablets (25-50 mg total) by mouth at bedtime as needed for sleep. 90 tablet 0   No facility-administered medications prior to visit.     Allergies  Allergen Reactions  . Remeron [Mirtazapine] Anaphylaxis  . Flagyl [Metronidazole] Nausea And Vomiting  . Lexapro [Escitalopram Oxalate]     hallucinations  . Tramadol Itching    ROS Review of Systems  Constitutional: Negative.   HENT: Negative.   Eyes: Negative.   Respiratory: Negative.   Cardiovascular: Negative.   Gastrointestinal: Negative.   Endocrine: Negative.   Genitourinary: Negative.   Musculoskeletal: Negative.   Skin: Negative.   Allergic/Immunologic: Negative.   Neurological: Negative.   Hematological: Negative.   Psychiatric/Behavioral: Positive for dysphoric mood and sleep disturbance. Negative for agitation, behavioral problems, confusion, decreased concentration, hallucinations, self-injury and suicidal ideas. The patient is nervous/anxious. The patient is not hyperactive.   All other systems reviewed and are negative.     Objective:    Physical Exam  Constitutional: She is oriented to person, place, and time. She appears  well-developed and well-nourished. No distress.  Cardiovascular: Normal rate and regular rhythm.  Pulmonary/Chest: Effort normal. No respiratory distress.  Neurological: She is alert and oriented to person, place, and time.  Skin: Skin is warm and dry. No rash noted. She is not diaphoretic. No erythema. No pallor.  Psychiatric: She has a normal mood and affect. Her speech is normal and behavior is normal. Judgment and thought content normal. Her mood appears not anxious. Her affect is not angry. Cognition and memory are normal. She does not exhibit a depressed mood.  Much improved since  last visit  Nursing note and vitals reviewed.   BP 108/73   Pulse 73   Temp 98.7 F (37.1 C) (Oral)   Resp 16   Ht 5' 8.11" (1.73 m)   Wt 235 lb (106.6 kg)   SpO2 99%   BMI 35.62 kg/m  Wt Readings from Last 3 Encounters:  09/11/19 235 lb (106.6 kg)  08/30/19 240 lb (108.9 kg)  08/01/19 241 lb (109.3 kg)     Health Maintenance Due  Topic Date Due  . PAP SMEAR-Modifier  10/16/2019    There are no preventive care reminders to display for this patient.  Lab Results  Component Value Date   TSH 1.77 05/17/2018   Lab Results  Component Value Date   WBC 11.0 (H) 02/07/2019   HGB 14.6 02/07/2019   HCT 44.9 02/07/2019   MCV 93.9 02/07/2019   PLT 364 02/07/2019   Lab Results  Component Value Date   NA 138 02/07/2019   K 4.0 02/07/2019   CO2 27 02/07/2019   GLUCOSE 86 02/07/2019   BUN 11 02/07/2019   CREATININE 0.80 02/07/2019   BILITOT 0.8 02/07/2019   ALKPHOS 62 02/07/2019   AST 24 02/07/2019   ALT 36 02/07/2019   PROT 8.4 (H) 02/07/2019   ALBUMIN 4.9 02/07/2019   CALCIUM 9.5 02/07/2019   ANIONGAP 8 02/07/2019   GFR 74.53 05/17/2018   No results found for: CHOL No results found for: HDL No results found for: LDLCALC No results found for: TRIG No results found for: CHOLHDL No results found for: ONGE9BHGBA1C    Assessment & Plan:   Problem List Items Addressed This Visit       Other   Generalized anxiety disorder   Relevant Medications   buPROPion (WELLBUTRIN XL) 300 MG 24 hr tablet   busPIRone (BUSPAR) 5 MG tablet   LORazepam (ATIVAN) 0.5 MG tablet      Meds ordered this encounter  Medications  . buPROPion (WELLBUTRIN XL) 300 MG 24 hr tablet    Sig: Take 1 tablet (300 mg total) by mouth daily.    Dispense:  90 tablet    Refill:  0    Order Specific Question:   Supervising Provider    Answer:   Collie SiadSTALLINGS, ZOE A [id number]]  . busPIRone (BUSPAR) 5 MG tablet    Sig: Take 1 tablet (5 mg total) by mouth 3 (three) times daily. As needed    Dispense:  90 tablet    Refill:  1    Order Specific Question:   Supervising Provider    Answer:   Collie SiadSTALLINGS, ZOE A [id number]]  . LORazepam (ATIVAN) 0.5 MG tablet    Sig: Take 1 tablet (0.5 mg total) by mouth 2 (two) times daily as needed for anxiety.    Dispense:  30 tablet    Refill:  1    Order Specific Question:   Supervising Provider    Answer:   Doristine BosworthSTALLINGS, ZOE A [id number]]    Follow-up: Return in about 3 months (around 12/12/2019) for med check.   PLAN  Increase Wellbutrin to 300mg  XR PO qd  Continue Buspar 5mg  PO tid PRN for breakthrough anxiety  Lorazepam 0.5mg  PO qhs for sleep - hopefully this will help more than trazodone, but we discussed that this is ideally a temporary medication  Patient encouraged to call clinic with any questions, comments, or concerns.   Janeece Ageeichard Trajon Rosete, NP

## 2019-09-11 NOTE — Patient Instructions (Signed)
° ° ° °  If you have lab work done today you will be contacted with your lab results within the next 2 weeks.  If you have not heard from us then please contact us. The fastest way to get your results is to register for My Chart. ° ° °IF you received an x-ray today, you will receive an invoice from [facility]. Please contact [facility] at [phone] with questions or concerns regarding your invoice.  ° °IF you received labwork today, you will receive an invoice from [facility]. Please contact [facility] at [phone] with questions or concerns regarding your invoice.  ° °Our billing staff will not be able to assist you with questions regarding bills from these companies. ° °You will be contacted with the lab results as soon as they are available. The fastest way to get your results is to activate your My Chart account. Instructions are located on the last page of this paperwork. If you have not heard from us regarding the results in 2 weeks, please contact this office. °  ° ° ° °

## 2019-09-12 ENCOUNTER — Ambulatory Visit: Payer: 59 | Admitting: Registered Nurse

## 2019-09-19 ENCOUNTER — Other Ambulatory Visit: Payer: Self-pay

## 2019-09-19 ENCOUNTER — Telehealth (INDEPENDENT_AMBULATORY_CARE_PROVIDER_SITE_OTHER): Payer: 59 | Admitting: Registered Nurse

## 2019-09-19 DIAGNOSIS — F411 Generalized anxiety disorder: Secondary | ICD-10-CM | POA: Diagnosis not present

## 2019-09-19 DIAGNOSIS — Z20822 Contact with and (suspected) exposure to covid-19: Secondary | ICD-10-CM

## 2019-09-19 DIAGNOSIS — R0683 Snoring: Secondary | ICD-10-CM

## 2019-09-19 MED ORDER — ALPRAZOLAM 0.5 MG PO TBDP: 0.5000 mg | ORAL_TABLET | Freq: Every evening | ORAL | 0 refills | Status: DC | PRN

## 2019-09-19 NOTE — Progress Notes (Signed)
Telemedicine Encounter- SOAP NOTE Established Patient  This telephone encounter was conducted with the patient's (or proxy's) verbal consent via audio telecommunications: yes  Patient was instructed to have this encounter in a suitably private space; and to only have persons present to whom they give permission to participate. In addition, patient identity was confirmed by use of name plus two identifiers (DOB and address).  I discussed the limitations, risks, security and privacy concerns of performing an evaluation and management service by telephone and the availability of in person appointments. I also discussed with the patient that there may be a patient responsible charge related to this service. The patient expressed understanding and agreed to proceed.  I spent a total of 15 minutes talking with the patient or their proxy.  No chief complaint on file.   Subjective   Krystal Humphrey is a 33 y.o. established patient. Telephone visit today for ongoing trouble sleeping.  HPI This is an issue that we have been assessing for some time - most recently, we tried lorazepam 0.5mg  PO qhs for sleep.  Unfortunately, Ms Shipton notes that she has been nauseous on waking whenever she takes this medication. This is to the point where she has missed work.  Additionally, she notes that she continues to have trouble sleeping and has been snoring. She states that her goal at this point is to get any amount of quality sleep.  Patient Active Problem List   Diagnosis Date Noted  . Generalized anxiety disorder 06/20/2019  . Obesity 01/07/2015    Past Medical History:  Diagnosis Date  . Anxiety   . Depression   . GERD (gastroesophageal reflux disease)   . Hiatal hernia   . History of Clostridium difficile colitis    04/ 2011  . History of duodenal ulcer    2009  . History of esophagitis   . History of panic attacks   . Nephrolithiasis    bilateral per ct 07-27-2016  L > R  (right side  nonobstructive)  . Urgency of urination     Current Outpatient Medications  Medication Sig Dispense Refill  . buPROPion (WELLBUTRIN XL) 300 MG 24 hr tablet Take 1 tablet (300 mg total) by mouth daily. 90 tablet 0  . busPIRone (BUSPAR) 5 MG tablet Take 1 tablet (5 mg total) by mouth 3 (three) times daily. As needed 90 tablet 1  . etonogestrel (NEXPLANON) 68 MG IMPL implant 1 each by Subdermal route once.    Marland Kitchen LORazepam (ATIVAN) 0.5 MG tablet Take 1 tablet (0.5 mg total) by mouth 2 (two) times daily as needed for anxiety. 30 tablet 1  . PROAIR HFA 108 (90 Base) MCG/ACT inhaler 1 2 PUFFS INHALE EVERY 4 6 HOURS AS NEEDED AS NEEDED FOR COUGH OR SHORTNESS OF BREATH    . ALPRAZolam (NIRAVAM) 0.5 MG dissolvable tablet Take 1 tablet (0.5 mg total) by mouth at bedtime as needed for anxiety. 30 tablet 0   No current facility-administered medications for this visit.     Allergies  Allergen Reactions  . Remeron [Mirtazapine] Anaphylaxis  . Flagyl [Metronidazole] Nausea And Vomiting  . Lexapro [Escitalopram Oxalate]     hallucinations  . Tramadol Itching    Social History   Socioeconomic History  . Marital status: Single    Spouse name: Not on file  . Number of children: 1  . Years of education: 79  . Highest education level: Not on file  Occupational History  . Occupation: unemployed  Social Needs  .  Financial resource strain: Not on file  . Food insecurity    Worry: Not on file    Inability: Not on file  . Transportation needs    Medical: Not on file    Non-medical: Not on file  Tobacco Use  . Smoking status: Former Smoker    Packs/day: 0.25    Years: 2.00    Pack years: 0.50    Quit date: 09/20/2008    Years since quitting: 11.0  . Smokeless tobacco: Never Used  Substance and Sexual Activity  . Alcohol use: Yes    Comment: occasional  . Drug use: No  . Sexual activity: Not on file    Comment: implant placed 09/ 2014  Lifestyle  . Physical activity    Days per week: Not  on file    Minutes per session: Not on file  . Stress: Not on file  Relationships  . Social Herbalist on phone: Not on file    Gets together: Not on file    Attends religious service: Not on file    Active member of club or organization: Not on file    Attends meetings of clubs or organizations: Not on file    Relationship status: Not on file  . Intimate partner violence    Fear of current or ex partner: Not on file    Emotionally abused: Not on file    Physically abused: Not on file    Forced sexual activity: Not on file  Other Topics Concern  . Not on file  Social History Narrative   Born and raised in [place], [place]. Currently resides in a house with her boyfriend and daughter. 2 dogs. Fun: Softball, bowl   Denies religious beliefs that would effect health care.     Review of Systems  Constitutional: Positive for malaise/fatigue. Negative for chills, diaphoresis, fever and weight loss.  HENT: Negative.   Eyes: Negative.   Respiratory: Negative.   Cardiovascular: Negative.   Gastrointestinal: Positive for nausea. Negative for abdominal pain, constipation, diarrhea, heartburn and vomiting.  Genitourinary: Negative.   Musculoskeletal: Negative.   Skin: Negative.   Neurological: Negative.   Endo/Heme/Allergies: Negative.   Psychiatric/Behavioral: Positive for depression. The patient is nervous/anxious.     Objective   Vitals as reported by the patient: There were no vitals filed for this visit.  Diagnoses and all orders for this visit:  Generalized anxiety disorder -     ALPRAZolam (NIRAVAM) 0.5 MG dissolvable tablet; Take 1 tablet (0.5 mg total) by mouth at bedtime as needed for anxiety.  Snoring -     Ambulatory referral to Pulmonology   PLAN  Pt to try alprazolam 0.5mg  dissolving tab, shorter half life than lorazepam may benefit patient  At this point she will need a sleep study to determine if apnea is an issue for her. Referral sent  Patient  encouraged to call clinic with any questions, comments, or concerns.    I discussed the assessment and treatment plan with the patient. The patient was provided an opportunity to ask questions and all were answered. The patient agreed with the plan and demonstrated an understanding of the instructions.   The patient was advised to call back or seek an in-person evaluation if the symptoms worsen or if the condition fails to improve as anticipated.  I provided 15 minutes of non-face-to-face time during this encounter.  Maximiano Coss, NP  Primary Care at [facility]

## 2019-09-19 NOTE — Progress Notes (Signed)
Spoke with pt and she states since starting the Lorazepam she has been having nausea and not wanting to eat. She states she would like to talk to you about possibly changing the medication to something different. Also she has developed a dry cough x 2 days she would like to disuses.

## 2019-09-20 LAB — NOVEL CORONAVIRUS, NAA: SARS-CoV-2, NAA: NOT DETECTED

## 2019-10-30 ENCOUNTER — Telehealth: Payer: Self-pay | Admitting: Registered Nurse

## 2019-10-30 ENCOUNTER — Other Ambulatory Visit: Payer: Self-pay | Admitting: Registered Nurse

## 2019-10-30 DIAGNOSIS — F411 Generalized anxiety disorder: Secondary | ICD-10-CM

## 2019-10-30 MED ORDER — ALPRAZOLAM 0.5 MG PO TBDP: 0.5000 mg | ORAL_TABLET | Freq: Every evening | ORAL | 0 refills | Status: DC | PRN

## 2019-10-30 NOTE — Telephone Encounter (Signed)
Sleep study referral was not placed for pt and she ran out of xanax prescription. Please advise

## 2019-10-30 NOTE — Telephone Encounter (Signed)
Hi,  Looks like referral to Pulmonology has been authorized - I don't have their number handy, but [facility] is where I send patients for sleep studies. Usually takes a few weeks for them to call, and I'd expect at this point they'll be scaling back their visits due to the increase in COVID and the nature of the patients they see.   I'll send her a refill of her alprazolam to make sure she can get through the next month - I'm hoping by then pulmonology will have started their process for sleep study  Thank you  Kathrin Ruddy, NP

## 2019-11-01 NOTE — Telephone Encounter (Signed)
Returned pt call and lvmtcb

## 2019-11-01 NOTE — Telephone Encounter (Signed)
Called pt and informed her of info given to me by Maximiano Coss, NP

## 2019-11-08 ENCOUNTER — Other Ambulatory Visit: Payer: Self-pay

## 2019-11-08 DIAGNOSIS — Z20822 Contact with and (suspected) exposure to covid-19: Secondary | ICD-10-CM

## 2019-11-10 LAB — NOVEL CORONAVIRUS, NAA: SARS-CoV-2, NAA: NOT DETECTED

## 2019-11-30 ENCOUNTER — Other Ambulatory Visit: Payer: Self-pay | Admitting: Registered Nurse

## 2019-11-30 DIAGNOSIS — F411 Generalized anxiety disorder: Secondary | ICD-10-CM

## 2019-12-02 NOTE — Telephone Encounter (Signed)
Requested medication (s) are due for refill today: yes  Requested medication (s) are on the active medication list: yes  Last refill:  09/19/2019  Future visit scheduled: yes  Notes to clinic: This refill cannot be delegated    Requested Prescriptions  Pending Prescriptions Disp Refills   ALPRAZolam (XANAX) 0.5 MG tablet [Pharmacy Med Name: ALPRAZolam 0.5 MG Oral Tablet] 30 tablet 0    Sig: TAKE 1 TABLET BY MOUTH AT BEDTIME AS NEEDED FOR ANXIETY      Not Delegated - Psychiatry:  Anxiolytics/Hypnotics Failed - 11/30/2019  5:56 PM      Failed - This refill cannot be delegated      Failed - Urine Drug Screen completed in last 360 days.      Passed - Valid encounter within last 6 months    Recent Outpatient Visits           2 months ago Generalized anxiety disorder   [facility], Gerlene Burdock, NP   2 months ago Generalized anxiety disorder   [facility], Richard, NP   3 months ago Blepharitis of both upper and lower eyelid of right eye, unspecified type   [facility] at [facility], Eilleen Kempf, MD   4 months ago Generalized anxiety disorder   [facility], Gerlene Burdock, NP   5 months ago Generalized anxiety disorder   [facility], Gerlene Burdock, NP       Future Appointments             In 1 week Janeece Agee, NP Primary [facility] at [place]

## 2019-12-07 ENCOUNTER — Encounter: Payer: Self-pay | Admitting: Registered Nurse

## 2019-12-07 ENCOUNTER — Ambulatory Visit: Payer: 59 | Admitting: Registered Nurse

## 2019-12-07 ENCOUNTER — Other Ambulatory Visit: Payer: Self-pay

## 2019-12-07 ENCOUNTER — Ambulatory Visit (INDEPENDENT_AMBULATORY_CARE_PROVIDER_SITE_OTHER): Payer: Medicaid Other | Admitting: Registered Nurse

## 2019-12-07 VITALS — BP 134/83 | HR 76 | Temp 98.0°F | Ht 71.0 in | Wt 234.2 lb

## 2019-12-07 DIAGNOSIS — R0683 Snoring: Secondary | ICD-10-CM

## 2019-12-07 DIAGNOSIS — F411 Generalized anxiety disorder: Secondary | ICD-10-CM

## 2019-12-07 MED ORDER — ALPRAZOLAM 0.5 MG PO TBDP: 0.5000 mg | ORAL_TABLET | Freq: Every evening | ORAL | 0 refills | Status: DC | PRN

## 2019-12-07 MED ORDER — BUPROPION HCL ER (XL) 300 MG PO TB24: 300.0000 mg | ORAL_TABLET | Freq: Every day | ORAL | 1 refills | Status: DC

## 2019-12-07 NOTE — Progress Notes (Signed)
Established Patient Office Visit  Subjective:  Patient ID: Krystal Humphrey, female    DOB: 09-Oct-1986  Age: 34 y.o. MRN: [id number]  CC:  Chief Complaint  Patient presents with  . Follow-up    f/u on medical conditions. PHQ=1 GAD=4  . Medication Refill    for xanax. patient would like to talk about phentermine.    HPI Krystal Humphrey presents for 3 mo follow up  We have been managing her anxiety and depression  Reports since last visit, she was laid off from her job. However, reports that she has overall been feeling much better. States that the wellbutrin has been helping out a lot. Still taking the alprazolam for some sleep disturbances, but overall, sleep improving vastly.   PHQ and GAD much improved compared to last visit.  Past Medical History:  Diagnosis Date  . Anxiety   . Depression   . GERD (gastroesophageal reflux disease)   . Hiatal hernia   . History of Clostridium difficile colitis    04/ 2011  . History of duodenal ulcer    2009  . History of esophagitis   . History of panic attacks   . Nephrolithiasis    bilateral per ct 07-27-2016  L > R  (right side nonobstructive)  . Urgency of urination     Past Surgical History:  Procedure Laterality Date  . CYSTOSCOPY WITH RETROGRADE PYELOGRAM, URETEROSCOPY AND STENT PLACEMENT Left 07/30/2016   Procedure: CYSTOSCOPY WITH LEFT  RETROGRADE PYELOGRAM AND LEFT STENT PLACEMENT;  Surgeon: Malen Gauze, MD;  Location: [facility];  Service: Urology;  Laterality: Left;  . CYSTOSCOPY WITH RETROGRADE PYELOGRAM, URETEROSCOPY AND STENT PLACEMENT Left 08/17/2016   Procedure: CYSTOSCOPY WITH LEFT RETROGRADE  STENT EXCHANGE, URETEROSCOPY AND STONE EXTRACTION, LASER LITHOTRIPSY AND LEFT RETROGRADE PYELOGRAM;  Surgeon: Bjorn Pippin, MD;  Location: [facility];  Service: Urology;  Laterality: Left;  . ESOPHAGOGASTRODUODENOSCOPY  last one 03-26-2009  . KNEE ARTHROSCOPY Right 03/19/2010  . TONSILLECTOMY  child    Family  History  Problem Relation Age of Onset  . Diabetes Mother   . Thyroid disease Mother   . Heart attack Father   . Diabetes Maternal Aunt   . Diabetes Maternal Uncle   . COPD Maternal Grandmother   . Crohn's disease Maternal Grandmother   . Anesthesia problems Neg Hx   . Hypotension Neg Hx   . Malignant hyperthermia Neg Hx   . Pseudochol deficiency Neg Hx     Social History   Socioeconomic History  . Marital status: Single    Spouse name: Not on file  . Number of children: 1  . Years of education: 40  . Highest education level: Not on file  Occupational History  . Occupation: unemployed  Tobacco Use  . Smoking status: Former Smoker    Packs/day: 0.25    Years: 2.00    Pack years: 0.50    Quit date: 09/20/2008    Years since quitting: 11.2  . Smokeless tobacco: Never Used  Substance and Sexual Activity  . Alcohol use: Yes    Comment: occasional  . Drug use: No  . Sexual activity: Not on file    Comment: implant placed 09/ 2014  Other Topics Concern  . Not on file  Social History Narrative   Born and raised in [place], [place]. Currently resides in a house with her boyfriend and daughter. 2 dogs. Fun: Softball, bowl   Denies religious beliefs that would effect health care.  Social Determinants of Health   Financial Resource Strain:   . Difficulty of Paying Living Expenses: Not on file  Food Insecurity:   . Worried About Programme researcher, broadcasting/film/video in the Last Year: Not on file  . Ran Out of Food in the Last Year: Not on file  Transportation Needs:   . Lack of Transportation (Medical): Not on file  . Lack of Transportation (Non-Medical): Not on file  Physical Activity:   . Days of Exercise per Week: Not on file  . Minutes of Exercise per Session: Not on file  Stress:   . Feeling of Stress : Not on file  Social Connections:   . Frequency of Communication with Friends and Family: Not on file  . Frequency of Social Gatherings with Friends and Family: Not on file  .  Attends Religious Services: Not on file  . Active Member of Clubs or Organizations: Not on file  . Attends Banker Meetings: Not on file  . Marital Status: Not on file  Intimate Partner Violence:   . Fear of Current or Ex-Partner: Not on file  . Emotionally Abused: Not on file  . Physically Abused: Not on file  . Sexually Abused: Not on file    Outpatient Medications Prior to Visit  Medication Sig Dispense Refill  . busPIRone (BUSPAR) 5 MG tablet Take 1 tablet (5 mg total) by mouth 3 (three) times daily. As needed 90 tablet 1  . etonogestrel (NEXPLANON) 68 MG IMPL implant 1 each by Subdermal route once.    . ALPRAZolam (XANAX) 0.5 MG tablet TAKE 1 TABLET BY MOUTH AT BEDTIME AS NEEDED FOR ANXIETY 30 tablet 0  . buPROPion (WELLBUTRIN XL) 300 MG 24 hr tablet Take 1 tablet (300 mg total) by mouth daily. 90 tablet 0  . LORazepam (ATIVAN) 0.5 MG tablet Take 1 tablet (0.5 mg total) by mouth 2 (two) times daily as needed for anxiety. (Patient not taking: Reported on 12/07/2019) 30 tablet 1  . PROAIR HFA 108 (90 Base) MCG/ACT inhaler 1 2 PUFFS INHALE EVERY 4 6 HOURS AS NEEDED AS NEEDED FOR COUGH OR SHORTNESS OF BREATH    . ALPRAZolam (NIRAVAM) 0.5 MG dissolvable tablet Take 1 tablet (0.5 mg total) by mouth at bedtime as needed for anxiety. (Patient not taking: Reported on 12/07/2019) 30 tablet 0   No facility-administered medications prior to visit.    Allergies  Allergen Reactions  . Remeron [Mirtazapine] Anaphylaxis  . Flagyl [Metronidazole] Nausea And Vomiting  . Lexapro [Escitalopram Oxalate]     hallucinations  . Tramadol Itching    ROS Review of Systems  Constitutional: Negative.   HENT: Negative.   Eyes: Negative.   Respiratory: Negative.   Cardiovascular: Negative.   Gastrointestinal: Negative.   Endocrine: Negative.   Genitourinary: Negative.   Musculoskeletal: Negative.   Skin: Negative.   Allergic/Immunologic: Negative.   Neurological: Negative.     Hematological: Negative.   Psychiatric/Behavioral: Negative.   All other systems reviewed and are negative.     Objective:    Physical Exam  Constitutional: She is oriented to person, place, and time. She appears well-developed and well-nourished. No distress.  Cardiovascular: Normal rate and regular rhythm.  Pulmonary/Chest: Effort normal. No respiratory distress.  Neurological: She is alert and oriented to person, place, and time.  Skin: Skin is warm and dry. No rash noted. She is not diaphoretic. No erythema. No pallor.  Psychiatric: She has a normal mood and affect. Her behavior is normal. Judgment and  thought content normal.  Nursing note and vitals reviewed.   BP 134/83   Pulse 76   Temp 98 F (36.7 C) (Temporal)   Ht 5\' 11"  (1.803 m)   Wt 234 lb 3.2 oz (106.2 kg)   SpO2 94%   BMI 32.66 kg/m  Wt Readings from Last 3 Encounters:  12/07/19 234 lb 3.2 oz (106.2 kg)  09/11/19 235 lb (106.6 kg)  08/30/19 240 lb (108.9 kg)     Health Maintenance Due  Topic Date Due  . PAP SMEAR-Modifier  10/16/2019    There are no preventive care reminders to display for this patient.  Lab Results  Component Value Date   TSH 1.77 05/17/2018   Lab Results  Component Value Date   WBC 11.0 (H) 02/07/2019   HGB 14.6 02/07/2019   HCT 44.9 02/07/2019   MCV 93.9 02/07/2019   PLT 364 02/07/2019   Lab Results  Component Value Date   NA 138 02/07/2019   K 4.0 02/07/2019   CO2 27 02/07/2019   GLUCOSE 86 02/07/2019   BUN 11 02/07/2019   CREATININE 0.80 02/07/2019   BILITOT 0.8 02/07/2019   ALKPHOS 62 02/07/2019   AST 24 02/07/2019   ALT 36 02/07/2019   PROT 8.4 (H) 02/07/2019   ALBUMIN 4.9 02/07/2019   CALCIUM 9.5 02/07/2019   ANIONGAP 8 02/07/2019   GFR 74.53 05/17/2018   No results found for: CHOL No results found for: HDL No results found for: LDLCALC No results found for: TRIG No results found for: CHOLHDL No results found for: HGBA1C    Assessment & Plan:    Problem List Items Addressed This Visit      Other   Generalized anxiety disorder   Relevant Medications   ALPRAZolam (NIRAVAM) 0.5 MG dissolvable tablet   buPROPion (WELLBUTRIN XL) 300 MG 24 hr tablet      Meds ordered this encounter  Medications  . ALPRAZolam (NIRAVAM) 0.5 MG dissolvable tablet    Sig: Take 1 tablet (0.5 mg total) by mouth at bedtime as needed for anxiety.    Dispense:  30 tablet    Refill:  0    Order Specific Question:   Supervising Provider    Answer:   Delia Chimes A [id number]  . buPROPion (WELLBUTRIN XL) 300 MG 24 hr tablet    Sig: Take 1 tablet (300 mg total) by mouth daily.    Dispense:  90 tablet    Refill:  1    Order Specific Question:   Supervising Provider    Answer:   Forrest Moron [id number]    Follow-up: No follow-ups on file.   PLAN  Continue wellbutrin and alprazolam prn. Buspar prn during day if needed  Replaced referral to sleep studies  Suggest trying to wean off alprazolam  Pt interested in pharmacological weight loss - however, has lost 7lbs in 6 mos on her own. As we do not prescribe these medications in our practice and she has been making significant progress, we've discussed diet and exercise and have encouraged her to continue  Patient encouraged to call clinic with any questions, comments, or concerns.   Maximiano Coss, NP

## 2019-12-07 NOTE — Patient Instructions (Signed)
° ° ° °  If you have lab work done today you will be contacted with your lab results within the next 2 weeks.  If you have not heard from us then please contact us. The fastest way to get your results is to register for My Chart. ° ° °IF you received an x-ray today, you will receive an invoice from [facility]. Please contact [facility] at [phone] with questions or concerns regarding your invoice.  ° °IF you received labwork today, you will receive an invoice from [facility]. Please contact [facility] at [phone] with questions or concerns regarding your invoice.  ° °Our billing staff will not be able to assist you with questions regarding bills from these companies. ° °You will be contacted with the lab results as soon as they are available. The fastest way to get your results is to activate your My Chart account. Instructions are located on the last page of this paperwork. If you have not heard from us regarding the results in 2 weeks, please contact this office. °  ° ° ° °

## 2019-12-12 ENCOUNTER — Ambulatory Visit: Payer: 59 | Admitting: Registered Nurse

## 2019-12-17 ENCOUNTER — Other Ambulatory Visit: Payer: Self-pay

## 2019-12-17 ENCOUNTER — Telehealth (INDEPENDENT_AMBULATORY_CARE_PROVIDER_SITE_OTHER): Payer: Medicaid Other | Admitting: Registered Nurse

## 2019-12-17 DIAGNOSIS — G44209 Tension-type headache, unspecified, not intractable: Secondary | ICD-10-CM

## 2019-12-17 MED ORDER — SUMATRIPTAN SUCCINATE 50 MG PO TABS: 50.0000 mg | ORAL_TABLET | ORAL | 0 refills | Status: DC | PRN

## 2019-12-17 NOTE — Progress Notes (Signed)
Telemedicine Encounter- SOAP NOTE Established Patient  This telephone encounter was conducted with the patient's (or proxy's) verbal consent via audio telecommunications: yes  Patient was instructed to have this encounter in a suitably private space; and to only have persons present to whom they give permission to participate. In addition, patient identity was confirmed by use of name plus two identifiers (DOB and address).  I discussed the limitations, risks, security and privacy concerns of performing an evaluation and management service by telephone and the availability of in person appointments. I also discussed with the patient that there may be a patient responsible charge related to this service. The patient expressed understanding and agreed to proceed.  I spent a total of 18 talking with the patient or their proxy.  No chief complaint on file.   Subjective   Krystal Humphrey is a 34 y.o. established patient. Telephone visit today for headache  HPI Onset around a week ago. Waxing and waning, overall worsening Dull and achy, worst in forehead but feels like band is wrapped around head and squeezing.  Recent stressors: Started new job, mother fell and fractured ankle, and death in the family for a member living in [place] (pt cannot travel to be with family). No visual or other sensory changes, nvd, weakness, loc, shob/doe, fever, chills, fatigue, or other symptoms at this time.  Some photophobia and phonophobia.   Patient Active Problem List   Diagnosis Date Noted  . Generalized anxiety disorder 06/20/2019  . Obesity 01/07/2015    Past Medical History:  Diagnosis Date  . Anxiety   . Depression   . GERD (gastroesophageal reflux disease)   . Hiatal hernia   . History of Clostridium difficile colitis    04/ 2011  . History of duodenal ulcer    2009  . History of esophagitis   . History of panic attacks   . Nephrolithiasis    bilateral per ct 07-27-2016  L > R  (right side  nonobstructive)  . Urgency of urination     Current Outpatient Medications  Medication Sig Dispense Refill  . ALPRAZolam (NIRAVAM) 0.5 MG dissolvable tablet Take 1 tablet (0.5 mg total) by mouth at bedtime as needed for anxiety. 30 tablet 0  . buPROPion (WELLBUTRIN XL) 300 MG 24 hr tablet Take 1 tablet (300 mg total) by mouth daily. 90 tablet 1  . busPIRone (BUSPAR) 5 MG tablet Take 1 tablet (5 mg total) by mouth 3 (three) times daily. As needed 90 tablet 1  . etonogestrel (NEXPLANON) 68 MG IMPL implant 1 each by Subdermal route once.     No current facility-administered medications for this visit.    Allergies  Allergen Reactions  . Remeron [Mirtazapine] Anaphylaxis  . Flagyl [Metronidazole] Nausea And Vomiting  . Lexapro [Escitalopram Oxalate]     hallucinations  . Tramadol Itching    Social History   Socioeconomic History  . Marital status: Single    Spouse name: Not on file  . Number of children: 1  . Years of education: 72  . Highest education level: Not on file  Occupational History  . Occupation: unemployed  Tobacco Use  . Smoking status: Former Smoker    Packs/day: 0.25    Years: 2.00    Pack years: 0.50    Quit date: 09/20/2008    Years since quitting: 11.2  . Smokeless tobacco: Never Used  Substance and Sexual Activity  . Alcohol use: Yes    Comment: occasional  . Drug use:  No  . Sexual activity: Not on file    Comment: implant placed 09/ 2014  Other Topics Concern  . Not on file  Social History Narrative   Born and raised in [place], [place]. Currently resides in a house with her boyfriend and daughter. 2 dogs. Fun: Softball, bowl   Denies religious beliefs that would effect health care.    Social Determinants of Health   Financial Resource Strain:   . Difficulty of Paying Living Expenses: Not on file  Food Insecurity:   . Worried About Programme researcher, broadcasting/film/video in the Last Year: Not on file  . Ran Out of Food in the Last Year: Not on file    Transportation Needs:   . Lack of Transportation (Medical): Not on file  . Lack of Transportation (Non-Medical): Not on file  Physical Activity:   . Days of Exercise per Week: Not on file  . Minutes of Exercise per Session: Not on file  Stress:   . Feeling of Stress : Not on file  Social Connections:   . Frequency of Communication with Friends and Family: Not on file  . Frequency of Social Gatherings with Friends and Family: Not on file  . Attends Religious Services: Not on file  . Active Member of Clubs or Organizations: Not on file  . Attends Banker Meetings: Not on file  . Marital Status: Not on file  Intimate Partner Violence:   . Fear of Current or Ex-Partner: Not on file  . Emotionally Abused: Not on file  . Physically Abused: Not on file  . Sexually Abused: Not on file    ROS Per hpi   Objective   Vitals as reported by the patient: There were no vitals filed for this visit.  Diagnoses and all orders for this visit:  Acute non intractable tension-type headache -     SUMAtriptan (IMITREX) 50 MG tablet; Take 1 tablet (50 mg total) by mouth every 2 (two) hours as needed for migraine. May repeat in 2 hours if headache persists or recurs. Then take 1 dose daily until headache resolves.   PLAN  Discussed that this is likely tension type headache given presentation, but photophobia and phonophobia can be characteristic of migraines.  Will try sumatriptan. Discussed risks and benefits with patient. Pt demonstrated understanding of how to use.  Encouraged use of nonpharms and OTCs as directed for headache relief  Reviewed red flags, ER precautions, reasons to call clinic. Patient demonstrated understanding  Patient encouraged to call clinic with any questions, comments, or concerns.   I discussed the assessment and treatment plan with the patient. The patient was provided an opportunity to ask questions and all were answered. The patient agreed with the  plan and demonstrated an understanding of the instructions.   The patient was advised to call back or seek an in-person evaluation if the symptoms worsen or if the condition fails to improve as anticipated.  I provided 18 minutes of non-face-to-face time during this encounter.  Janeece Agee, NP  [facility] at [facility]

## 2019-12-17 NOTE — Progress Notes (Signed)
Patient states she been having headaches since last Wednesday been taking pain medication to help and sometimes it relieves the pain. Patient stated she has been under a lot of stress of a death in the family , and think that might be the problem as well.

## 2019-12-31 ENCOUNTER — Encounter: Payer: Self-pay | Admitting: Registered Nurse

## 2019-12-31 ENCOUNTER — Other Ambulatory Visit: Payer: Self-pay

## 2019-12-31 ENCOUNTER — Ambulatory Visit (INDEPENDENT_AMBULATORY_CARE_PROVIDER_SITE_OTHER): Payer: Medicaid Other | Admitting: Registered Nurse

## 2019-12-31 VITALS — BP 144/83 | HR 85 | Temp 98.0°F | Ht 71.0 in | Wt 242.6 lb

## 2019-12-31 DIAGNOSIS — R109 Unspecified abdominal pain: Secondary | ICD-10-CM | POA: Diagnosis not present

## 2019-12-31 DIAGNOSIS — N200 Calculus of kidney: Secondary | ICD-10-CM | POA: Diagnosis not present

## 2019-12-31 DIAGNOSIS — R1032 Left lower quadrant pain: Secondary | ICD-10-CM

## 2019-12-31 LAB — POCT URINALYSIS DIP (CLINITEK)
Bilirubin, UA: NEGATIVE
Glucose, UA: NEGATIVE mg/dL
Ketones, POC UA: NEGATIVE mg/dL
Leukocytes, UA: NEGATIVE
Nitrite, UA: NEGATIVE
POC PROTEIN,UA: NEGATIVE
Spec Grav, UA: 1.025 (ref 1.010–1.025)
Urobilinogen, UA: 0.2 E.U./dL
pH, UA: 7 (ref 5.0–8.0)

## 2019-12-31 MED ORDER — OXYCODONE HCL 5 MG PO TABS: 5.0000 mg | ORAL_TABLET | Freq: Four times a day (QID) | ORAL | 0 refills | Status: DC | PRN

## 2019-12-31 MED ORDER — TAMSULOSIN HCL 0.4 MG PO CAPS: 0.4000 mg | ORAL_CAPSULE | Freq: Every day | ORAL | 0 refills | Status: DC

## 2019-12-31 NOTE — Progress Notes (Signed)
Acute Office Visit  Subjective:    Patient ID: Krystal Humphrey, female    DOB: 19-Sep-1986, 34 y.o.   MRN: [id number]  Chief Complaint  Patient presents with  . Back Pain    pain in the back for over a week . patient states she took tylenol and and ibuprofen but the pain is getting worse. per patient she thinks maybe its kidney stones    HPI Patient is in today for acute L flank pain Onset over the weekend, worsening over all. Pain waxes and wanes. Taken OTCs without relief. Pain has been excruciating, leaving her essentially bed bound over the weekend. History of renal stone on L side - 0.8cm that had to be surgically removed. This pain feels very similar.  No gross hematuria, no other urinary symptoms.   Past Medical History:  Diagnosis Date  . Anxiety   . Depression   . GERD (gastroesophageal reflux disease)   . Hiatal hernia   . History of Clostridium difficile colitis    04/ 2011  . History of duodenal ulcer    2009  . History of esophagitis   . History of panic attacks   . Nephrolithiasis    bilateral per ct 07-27-2016  L > R  (right side nonobstructive)  . Urgency of urination     Past Surgical History:  Procedure Laterality Date  . CYSTOSCOPY WITH RETROGRADE PYELOGRAM, URETEROSCOPY AND STENT PLACEMENT Left 07/30/2016   Procedure: CYSTOSCOPY WITH LEFT  RETROGRADE PYELOGRAM AND LEFT STENT PLACEMENT;  Surgeon: Malen Gauze, MD;  Location: [facility];  Service: Urology;  Laterality: Left;  . CYSTOSCOPY WITH RETROGRADE PYELOGRAM, URETEROSCOPY AND STENT PLACEMENT Left 08/17/2016   Procedure: CYSTOSCOPY WITH LEFT RETROGRADE  STENT EXCHANGE, URETEROSCOPY AND STONE EXTRACTION, LASER LITHOTRIPSY AND LEFT RETROGRADE PYELOGRAM;  Surgeon: Bjorn Pippin, MD;  Location: [facility];  Service: Urology;  Laterality: Left;  . ESOPHAGOGASTRODUODENOSCOPY  last one 03-26-2009  . KNEE ARTHROSCOPY Right 03/19/2010  . TONSILLECTOMY  child    Family History  Problem Relation  Age of Onset  . Diabetes Mother   . Thyroid disease Mother   . Heart attack Father   . Diabetes Maternal Aunt   . Diabetes Maternal Uncle   . COPD Maternal Grandmother   . Crohn's disease Maternal Grandmother   . Anesthesia problems Neg Hx   . Hypotension Neg Hx   . Malignant hyperthermia Neg Hx   . Pseudochol deficiency Neg Hx     Social History   Socioeconomic History  . Marital status: Single    Spouse name: Not on file  . Number of children: 1  . Years of education: 75  . Highest education level: Not on file  Occupational History  . Occupation: unemployed  Tobacco Use  . Smoking status: Former Smoker    Packs/day: 0.25    Years: 2.00    Pack years: 0.50    Quit date: 09/20/2008    Years since quitting: 11.2  . Smokeless tobacco: Never Used  Substance and Sexual Activity  . Alcohol use: Yes    Comment: occasional  . Drug use: No  . Sexual activity: Not on file    Comment: implant placed 09/ 2014  Other Topics Concern  . Not on file  Social History Narrative   Born and raised in [place], [place]. Currently resides in a house with her boyfriend and daughter. 2 dogs. Fun: Softball, bowl   Denies religious beliefs that would effect health care.  Social Determinants of Health   Financial Resource Strain:   . Difficulty of Paying Living Expenses: Not on file  Food Insecurity:   . Worried About Programme researcher, broadcasting/film/video in the Last Year: Not on file  . Ran Out of Food in the Last Year: Not on file  Transportation Needs:   . Lack of Transportation (Medical): Not on file  . Lack of Transportation (Non-Medical): Not on file  Physical Activity:   . Days of Exercise per Week: Not on file  . Minutes of Exercise per Session: Not on file  Stress:   . Feeling of Stress : Not on file  Social Connections:   . Frequency of Communication with Friends and Family: Not on file  . Frequency of Social Gatherings with Friends and Family: Not on file  . Attends Religious Services: Not  on file  . Active Member of Clubs or Organizations: Not on file  . Attends Banker Meetings: Not on file  . Marital Status: Not on file  Intimate Partner Violence:   . Fear of Current or Ex-Partner: Not on file  . Emotionally Abused: Not on file  . Physically Abused: Not on file  . Sexually Abused: Not on file    Outpatient Medications Prior to Visit  Medication Sig Dispense Refill  . ALPRAZolam (NIRAVAM) 0.5 MG dissolvable tablet Take 1 tablet (0.5 mg total) by mouth at bedtime as needed for anxiety. 30 tablet 0  . buPROPion (WELLBUTRIN XL) 300 MG 24 hr tablet Take 1 tablet (300 mg total) by mouth daily. 90 tablet 1  . busPIRone (BUSPAR) 5 MG tablet Take 1 tablet (5 mg total) by mouth 3 (three) times daily. As needed 90 tablet 1  . etonogestrel (NEXPLANON) 68 MG IMPL implant 1 each by Subdermal route once.    . SUMAtriptan (IMITREX) 50 MG tablet Take 1 tablet (50 mg total) by mouth every 2 (two) hours as needed for migraine. May repeat in 2 hours if headache persists or recurs. Then take 1 dose daily until headache resolves. 10 tablet 0   No facility-administered medications prior to visit.    Allergies  Allergen Reactions  . Remeron [Mirtazapine] Anaphylaxis  . Flagyl [Metronidazole] Nausea And Vomiting  . Lexapro [Escitalopram Oxalate]     hallucinations  . Tramadol Itching    Review of Systems  Constitutional: Negative.   HENT: Negative.   Eyes: Negative.   Respiratory: Negative.   Cardiovascular: Negative.   Gastrointestinal: Negative.   Endocrine: Negative.   Genitourinary: Positive for flank pain. Negative for decreased urine volume, difficulty urinating, dyspareunia, dysuria, enuresis, frequency, genital sores, hematuria, menstrual problem, pelvic pain, urgency, vaginal bleeding, vaginal discharge and vaginal pain.  Musculoskeletal: Positive for back pain and myalgias. Negative for arthralgias, gait problem, joint swelling, neck pain and neck stiffness.   Skin: Negative.   Allergic/Immunologic: Negative.   Neurological: Negative.   Hematological: Negative.   Psychiatric/Behavioral: Negative.   All other systems reviewed and are negative.      Objective:    Physical Exam Vitals and nursing note reviewed.  Constitutional:      General: She is not in acute distress.    Appearance: Normal appearance. She is normal weight. She is not ill-appearing, toxic-appearing or diaphoretic.  Cardiovascular:     Rate and Rhythm: Normal rate and regular rhythm.  Pulmonary:     Effort: Pulmonary effort is normal. No respiratory distress.  Abdominal:     General: Abdomen is flat. Bowel sounds are  normal.  Musculoskeletal:        General: Tenderness (L flank) present.  Skin:    General: Skin is warm and dry.     Capillary Refill: Capillary refill takes less than 2 seconds.  Neurological:     General: No focal deficit present.     Mental Status: She is alert and oriented to person, place, and time. Mental status is at baseline.  Psychiatric:        Mood and Affect: Mood normal.        Behavior: Behavior normal.        Thought Content: Thought content normal.        Judgment: Judgment normal.     BP (!) 144/83   Pulse 85   Temp 98 F (36.7 C) (Temporal)   Ht 5\' 11"  (1.803 m)   Wt 242 lb 9.6 oz (110 kg)   SpO2 99%   BMI 33.84 kg/m  Wt Readings from Last 3 Encounters:  12/31/19 242 lb 9.6 oz (110 kg)  12/07/19 234 lb 3.2 oz (106.2 kg)  09/11/19 235 lb (106.6 kg)    There are no preventive care reminders to display for this patient.  There are no preventive care reminders to display for this patient.   Lab Results  Component Value Date   TSH 1.77 05/17/2018   Lab Results  Component Value Date   WBC 11.0 (H) 02/07/2019   HGB 14.6 02/07/2019   HCT 44.9 02/07/2019   MCV 93.9 02/07/2019   PLT 364 02/07/2019   Lab Results  Component Value Date   NA 138 02/07/2019   K 4.0 02/07/2019   CO2 27 02/07/2019   GLUCOSE 86  02/07/2019   BUN 11 02/07/2019   CREATININE 0.80 02/07/2019   BILITOT 0.8 02/07/2019   ALKPHOS 62 02/07/2019   AST 24 02/07/2019   ALT 36 02/07/2019   PROT 8.4 (H) 02/07/2019   ALBUMIN 4.9 02/07/2019   CALCIUM 9.5 02/07/2019   ANIONGAP 8 02/07/2019   GFR 74.53 05/17/2018   No results found for: CHOL No results found for: HDL No results found for: LDLCALC No results found for: TRIG No results found for: CHOLHDL No results found for: 05/19/2018     Assessment & Plan:   Problem List Items Addressed This Visit    None    Visit Diagnoses    Flank pain    -  Primary   Relevant Medications   oxyCODONE (ROXICODONE) 5 MG immediate release tablet   tamsulosin (FLOMAX) 0.4 MG CAPS capsule   Other Relevant Orders   POCT URINALYSIS DIP (CLINITEK) (Completed)   Urine Culture   CT RENAL STONE STUDY   Left lower quadrant abdominal pain       Relevant Orders   CT RENAL STONE STUDY   Nephrolithiasis       Relevant Medications   oxyCODONE (ROXICODONE) 5 MG immediate release tablet   tamsulosin (FLOMAX) 0.4 MG CAPS capsule   Other Relevant Orders   CT RENAL STONE STUDY   Uric Acid   Comprehensive metabolic panel   CBC       Meds ordered this encounter  Medications  . oxyCODONE (ROXICODONE) 5 MG immediate release tablet    Sig: Take 1 tablet (5 mg total) by mouth every 6 (six) hours as needed for severe pain.    Dispense:  30 tablet    Refill:  0    Order Specific Question:   Supervising Provider    Answer:  STALLINGS, ZOE A [id number]  . tamsulosin (FLOMAX) 0.4 MG CAPS capsule    Sig: Take 1 capsule (0.4 mg total) by mouth daily.    Dispense:  7 capsule    Refill:  0    Order Specific Question:   Supervising Provider    Answer:   Forrest Moron [id number]   PLAN  Hematuria on POCT UA dip  Suspect nephrolithiasis.  Given hx of stones, will order CT renal to rule out obstruction or potential complication  Pain control with oxycodone, flomax to try to help pass stone   Suggest urine straining and stone collection. Given collection vessel and instructions to call clinic if she is able to gather stone  Labs drawn, will follow up as warranted  Patient encouraged to call clinic with any questions, comments, or concerns.   Maximiano Coss, NP

## 2019-12-31 NOTE — Patient Instructions (Signed)
° ° ° °  If you have lab work done today you will be contacted with your lab results within the next 2 weeks.  If you have not heard from us then please contact us. The fastest way to get your results is to register for My Chart. ° ° °IF you received an x-ray today, you will receive an invoice from [facility]. Please contact [facility] at [phone] with questions or concerns regarding your invoice.  ° °IF you received labwork today, you will receive an invoice from [facility]. Please contact [facility] at [phone] with questions or concerns regarding your invoice.  ° °Our billing staff will not be able to assist you with questions regarding bills from these companies. ° °You will be contacted with the lab results as soon as they are available. The fastest way to get your results is to activate your My Chart account. Instructions are located on the last page of this paperwork. If you have not heard from us regarding the results in 2 weeks, please contact this office. °  ° ° ° °

## 2020-01-01 ENCOUNTER — Encounter: Payer: Self-pay | Admitting: Registered Nurse

## 2020-01-01 LAB — COMPREHENSIVE METABOLIC PANEL
ALT: 39 IU/L — ABNORMAL HIGH (ref 0–32)
AST: 19 IU/L (ref 0–40)
Albumin/Globulin Ratio: 1.8 (ref 1.2–2.2)
Albumin: 4.4 g/dL (ref 3.8–4.8)
Alkaline Phosphatase: 79 IU/L (ref 39–117)
BUN/Creatinine Ratio: 14 (ref 9–23)
BUN: 11 mg/dL (ref 6–20)
Bilirubin Total: 0.3 mg/dL (ref 0.0–1.2)
CO2: 20 mmol/L (ref 20–29)
Calcium: 9.5 mg/dL (ref 8.7–10.2)
Chloride: 101 mmol/L (ref 96–106)
Creatinine, Ser: 0.78 mg/dL (ref 0.57–1.00)
GFR calc Af Amer: 117 mL/min/{1.73_m2} (ref >59)
GFR calc non Af Amer: 102 mL/min/{1.73_m2} (ref >59)
Globulin, Total: 2.4 g/dL (ref 1.5–4.5)
Glucose: 99 mg/dL (ref 65–99)
Potassium: 4.6 mmol/L (ref 3.5–5.2)
Sodium: 137 mmol/L (ref 134–144)
Total Protein: 6.8 g/dL (ref 6.0–8.5)

## 2020-01-01 LAB — CBC
Hematocrit: 43.7 % (ref 34.0–46.6)
Hemoglobin: 14.9 g/dL (ref 11.1–15.9)
MCH: 31 pg (ref 26.6–33.0)
MCHC: 34.1 g/dL (ref 31.5–35.7)
MCV: 91 fL (ref 79–97)
Platelets: 397 10*3/uL (ref 150–450)
RBC: 4.81 x10E6/uL (ref 3.77–5.28)
RDW: 12.6 % (ref 11.7–15.4)
WBC: 12.2 10*3/uL — ABNORMAL HIGH (ref 3.4–10.8)

## 2020-01-01 LAB — URIC ACID: Uric Acid: 4.3 mg/dL (ref 2.6–6.2)

## 2020-01-02 ENCOUNTER — Telehealth: Payer: Self-pay | Admitting: Registered Nurse

## 2020-01-02 DIAGNOSIS — R8271 Bacteriuria: Secondary | ICD-10-CM

## 2020-01-02 LAB — URINE CULTURE

## 2020-01-02 MED ORDER — AMPICILLIN 500 MG PO CAPS: 500.0000 mg | ORAL_CAPSULE | Freq: Four times a day (QID) | ORAL | 0 refills | Status: DC

## 2020-01-02 NOTE — Telephone Encounter (Signed)
Called patients to discuss labs While she is having renal colic typical of her past stones, looks like there may be concurrent UTI, culture shows beta hemolytic group b strep Will treat outpatient given uncomplicated nature with ampicillin 500mg  q6h for five days. Discussed red flags, patient understands when to present to ED  , NP

## 2020-01-03 ENCOUNTER — Ambulatory Visit: Payer: Medicaid Other | Admitting: Neurology

## 2020-01-03 ENCOUNTER — Telehealth: Payer: Self-pay

## 2020-01-03 NOTE — Telephone Encounter (Signed)
Patient no showed 01/03/2020 appointment with Dr. Frances Furbish.

## 2020-01-04 ENCOUNTER — Encounter: Payer: Self-pay | Admitting: Neurology

## 2020-01-07 ENCOUNTER — Telehealth (INDEPENDENT_AMBULATORY_CARE_PROVIDER_SITE_OTHER): Payer: Medicaid Other | Admitting: Registered Nurse

## 2020-01-07 ENCOUNTER — Other Ambulatory Visit: Payer: Self-pay

## 2020-01-07 DIAGNOSIS — R109 Unspecified abdominal pain: Secondary | ICD-10-CM

## 2020-01-07 DIAGNOSIS — N200 Calculus of kidney: Secondary | ICD-10-CM

## 2020-01-07 NOTE — Progress Notes (Signed)
Patient states she is still hurting on the left side took the Flomax and nothing has yet to pass also she is almost out of the antibiotic for the UTI , and need to know if she needs to be seen again for that. Per patient she wants to know if you can send more Oxycodone 5 mg being that she can't see [facility] until next Monday.

## 2020-01-07 NOTE — Patient Instructions (Signed)
° ° ° °  If you have lab work done today you will be contacted with your lab results within the next 2 weeks.  If you have not heard from us then please contact us. The fastest way to get your results is to register for My Chart. ° ° °IF you received an x-ray today, you will receive an invoice from [facility]. Please contact [facility] at [phone] with questions or concerns regarding your invoice.  ° °IF you received labwork today, you will receive an invoice from [facility]. Please contact [facility] at [phone] with questions or concerns regarding your invoice.  ° °Our billing staff will not be able to assist you with questions regarding bills from these companies. ° °You will be contacted with the lab results as soon as they are available. The fastest way to get your results is to activate your My Chart account. Instructions are located on the last page of this paperwork. If you have not heard from us regarding the results in 2 weeks, please contact this office. °  ° ° ° °

## 2020-01-07 NOTE — Progress Notes (Signed)
Telemedicine Encounter- SOAP NOTE Established Patient  This telephone encounter was conducted with the patient's (or proxy's) verbal consent via audio telecommunications: yes  Patient was instructed to have this encounter in a suitably private space; and to only have persons present to whom they give permission to participate. In addition, patient identity was confirmed by use of name plus two identifiers (DOB and address).  I discussed the limitations, risks, security and privacy concerns of performing an evaluation and management service by telephone and the availability of in person appointments. I also discussed with the patient that there may be a patient responsible charge related to this service. The patient expressed understanding and agreed to proceed.  I spent a total of 13 minutes talking with the patient or their proxy.  No chief complaint on file.   Subjective   Krystal Humphrey is a 34 y.o. established patient. Telephone visit today for ongoing renal colic  HPI Onset last week. Feels like nephrolithiasis to patient, who has had multiple in the past. Notes intense renal colic with nausea, frequent urination of less quantity, and some UTI symptoms.  With urine culture we discovered group B beta hemolytic strep UTI, which was treated with ampicillin 500mg  PO qid for 5 days. Patient reports that between this, flomax, and oxycodone, symptoms are controlled but still present. Pain worsens with activity She has been straining her urine, no stone passed as of yet.  Scheduled for stone study with [facility] one week from today, this was the soonest they could get her in. No new or changing symptoms.  Patient Active Problem List   Diagnosis Date Noted  . Generalized anxiety disorder 06/20/2019  . Obesity 01/07/2015    Past Medical History:  Diagnosis Date  . Anxiety   . Depression   . GERD (gastroesophageal reflux disease)   . Hiatal hernia   . History of Clostridium  difficile colitis    04/ 2011  . History of duodenal ulcer    2009  . History of esophagitis   . History of panic attacks   . Nephrolithiasis    bilateral per ct 07-27-2016  L > R  (right side nonobstructive)  . Urgency of urination     Current Outpatient Medications  Medication Sig Dispense Refill  . ALPRAZolam (NIRAVAM) 0.5 MG dissolvable tablet Take 1 tablet (0.5 mg total) by mouth at bedtime as needed for anxiety. 30 tablet 0  . ampicillin (PRINCIPEN) 500 MG capsule Take 1 capsule (500 mg total) by mouth 4 (four) times daily. 20 capsule 0  . buPROPion (WELLBUTRIN XL) 300 MG 24 hr tablet Take 1 tablet (300 mg total) by mouth daily. 90 tablet 1  . busPIRone (BUSPAR) 5 MG tablet Take 1 tablet (5 mg total) by mouth 3 (three) times daily. As needed 90 tablet 1  . etonogestrel (NEXPLANON) 68 MG IMPL implant 1 each by Subdermal route once.    07-29-2016 oxyCODONE (ROXICODONE) 5 MG immediate release tablet Take 1 tablet (5 mg total) by mouth every 6 (six) hours as needed for severe pain. 30 tablet 0  . SUMAtriptan (IMITREX) 50 MG tablet Take 1 tablet (50 mg total) by mouth every 2 (two) hours as needed for migraine. May repeat in 2 hours if headache persists or recurs. Then take 1 dose daily until headache resolves. 10 tablet 0  . tamsulosin (FLOMAX) 0.4 MG CAPS capsule Take 1 capsule (0.4 mg total) by mouth daily. 7 capsule 0   No current facility-administered medications for  this visit.    Allergies  Allergen Reactions  . Remeron [Mirtazapine] Anaphylaxis  . Flagyl [Metronidazole] Nausea And Vomiting  . Lexapro [Escitalopram Oxalate]     hallucinations  . Tramadol Itching    Social History   Socioeconomic History  . Marital status: Single    Spouse name: Not on file  . Number of children: 1  . Years of education: 34  . Highest education level: Not on file  Occupational History  . Occupation: unemployed  Tobacco Use  . Smoking status: Former Smoker    Packs/day: 0.25    Years: 2.00     Pack years: 0.50    Quit date: 09/20/2008    Years since quitting: 11.3  . Smokeless tobacco: Never Used  Substance and Sexual Activity  . Alcohol use: Yes    Comment: occasional  . Drug use: No  . Sexual activity: Not on file    Comment: implant placed 09/ 2014  Other Topics Concern  . Not on file  Social History Narrative   Born and raised in [place], [place]. Currently resides in a house with her boyfriend and daughter. 2 dogs. Fun: Softball, bowl   Denies religious beliefs that would effect health care.    Social Determinants of Health   Financial Resource Strain:   . Difficulty of Paying Living Expenses: Not on file  Food Insecurity:   . Worried About Programme researcher, broadcasting/film/video in the Last Year: Not on file  . Ran Out of Food in the Last Year: Not on file  Transportation Needs:   . Lack of Transportation (Medical): Not on file  . Lack of Transportation (Non-Medical): Not on file  Physical Activity:   . Days of Exercise per Week: Not on file  . Minutes of Exercise per Session: Not on file  Stress:   . Feeling of Stress : Not on file  Social Connections:   . Frequency of Communication with Friends and Family: Not on file  . Frequency of Social Gatherings with Friends and Family: Not on file  . Attends Religious Services: Not on file  . Active Member of Clubs or Organizations: Not on file  . Attends Banker Meetings: Not on file  . Marital Status: Not on file  Intimate Partner Violence:   . Fear of Current or Ex-Partner: Not on file  . Emotionally Abused: Not on file  . Physically Abused: Not on file  . Sexually Abused: Not on file    ROS Per hpi  Objective   Vitals as reported by the patient: There were no vitals filed for this visit.  Diagnoses and all orders for this visit:  Flank pain  Nephrolithiasis   PLAN  Refill oxycodone and flomax. Patient recently started new job and does not want to miss time - made sure she understands to take  opioid pain medication after work shift is over. May take azo before/during work as urinary tract NSAID pain relief.  Continue urine straining as we await imaging  ED precautions reiterated  Patient encouraged to call clinic with any questions, comments, or concerns.   I discussed the assessment and treatment plan with the patient. The patient was provided an opportunity to ask questions and all were answered. The patient agreed with the plan and demonstrated an understanding of the instructions.   The patient was advised to call back or seek an in-person evaluation if the symptoms worsen or if the condition fails to improve as anticipated.  I provided  13 minutes of non-face-to-face time during this encounter.  Maximiano Coss, NP  Primary Care at [facility]

## 2020-01-08 MED ORDER — TAMSULOSIN HCL 0.4 MG PO CAPS: 0.4000 mg | ORAL_CAPSULE | Freq: Every day | ORAL | 0 refills | Status: DC

## 2020-01-08 MED ORDER — OXYCODONE HCL 5 MG PO TABS: 5.0000 mg | ORAL_TABLET | Freq: Four times a day (QID) | ORAL | 0 refills | Status: DC | PRN

## 2020-01-14 ENCOUNTER — Ambulatory Visit
Admission: RE | Admit: 2020-01-14 | Discharge: 2020-01-14 | Disposition: A | Discharge status: Home / Self Care | Payer: BLUE CROSS/BLUE SHIELD | Source: Ambulatory Visit | Attending: Registered Nurse | Admitting: Registered Nurse

## 2020-01-14 DIAGNOSIS — N200 Calculus of kidney: Secondary | ICD-10-CM

## 2020-01-14 DIAGNOSIS — R109 Unspecified abdominal pain: Secondary | ICD-10-CM

## 2020-01-14 DIAGNOSIS — R1032 Left lower quadrant pain: Secondary | ICD-10-CM

## 2020-01-15 ENCOUNTER — Other Ambulatory Visit: Payer: Self-pay | Admitting: Registered Nurse

## 2020-01-15 ENCOUNTER — Telehealth: Payer: Self-pay | Admitting: Registered Nurse

## 2020-01-15 DIAGNOSIS — N200 Calculus of kidney: Secondary | ICD-10-CM

## 2020-01-15 NOTE — Telephone Encounter (Signed)
Called and informed the patient.

## 2020-01-15 NOTE — Telephone Encounter (Signed)
Pt would like a cb concerning her CT RENAL STONE STUDY she had done yesterday. She would also like to know what to do going forward. Please advise at [phone].

## 2020-01-15 NOTE — Telephone Encounter (Signed)
I will place an urgent referral to Urology for assessment and removal.  Thank you,  Jari Sportsman, NP

## 2020-01-15 NOTE — Progress Notes (Signed)
Unfortunately flomax and analgesia did not help pass renal stones Referred to urology  Jari Sportsman, NP

## 2020-01-15 NOTE — Telephone Encounter (Signed)
Patient states she had her CT done and the kidney stones are still there , and almost out of medication.

## 2020-02-11 ENCOUNTER — Ambulatory Visit: Payer: Medicaid Other | Attending: Physical Therapy | Admitting: Physical Therapy

## 2020-02-27 ENCOUNTER — Ambulatory Visit: Payer: Medicaid Other | Admitting: Physical Therapy

## 2020-03-12 ENCOUNTER — Other Ambulatory Visit: Payer: Self-pay

## 2020-03-12 ENCOUNTER — Telehealth (INDEPENDENT_AMBULATORY_CARE_PROVIDER_SITE_OTHER): Payer: Medicaid Other | Admitting: Registered Nurse

## 2020-03-12 VITALS — Wt 230.0 lb

## 2020-03-12 DIAGNOSIS — J302 Other seasonal allergic rhinitis: Secondary | ICD-10-CM | POA: Diagnosis not present

## 2020-03-12 DIAGNOSIS — J040 Acute laryngitis: Secondary | ICD-10-CM | POA: Diagnosis not present

## 2020-03-12 MED ORDER — FLUTICASONE PROPIONATE 50 MCG/ACT NA SUSP: 2.0000 | Freq: Every day | NASAL | 6 refills | Status: DC

## 2020-03-12 MED ORDER — CETIRIZINE HCL 10 MG PO TABS: 10.0000 mg | ORAL_TABLET | Freq: Every day | ORAL | 11 refills | Status: DC

## 2020-03-12 MED ORDER — PREDNISONE 20 MG PO TABS: 40.0000 mg | ORAL_TABLET | Freq: Every day | ORAL | 0 refills | Status: DC

## 2020-03-12 NOTE — Progress Notes (Signed)
Sore throat loosing voice x3 days

## 2020-03-12 NOTE — Patient Instructions (Signed)
° ° ° °  If you have lab work done today you will be contacted with your lab results within the next 2 weeks.  If you have not heard from us then please contact us. The fastest way to get your results is to register for My Chart. ° ° °IF you received an x-ray today, you will receive an invoice from [facility]. Please contact  Radiology at [phone] with questions or concerns regarding your invoice.  ° °IF you received labwork today, you will receive an invoice from [facility]. Please contact [facility] at [phone] with questions or concerns regarding your invoice.  ° °Our billing staff will not be able to assist you with questions regarding bills from these companies. ° °You will be contacted with the lab results as soon as they are available. The fastest way to get your results is to activate your My Chart account. Instructions are located on the last page of this paperwork. If you have not heard from us regarding the results in 2 weeks, please contact this office. °  ° ° ° °

## 2020-03-12 NOTE — Progress Notes (Signed)
Telemedicine Encounter- SOAP NOTE Established Patient  This telephone encounter was conducted with the patient's (or proxy's) verbal consent via audio telecommunications: yes  Patient was instructed to have this encounter in a suitably private space; and to only have persons present to whom they give permission to participate. In addition, patient identity was confirmed by use of name plus two identifiers (DOB and address).  I discussed the limitations, risks, security and privacy concerns of performing an evaluation and management service by telephone and the availability of in person appointments. I also discussed with the patient that there may be a patient responsible charge related to this service. The patient expressed understanding and agreed to proceed.  I spent a total of 13 minutes talking with the patient or their proxy.  Chief Complaint  Patient presents with  . Sore Throat    x3 days, notes occasinal cough, denies drainage, pt tried benadryl OTC for allergies, pt states throat is painful on swallowing    Subjective   Krystal Humphrey is a 34 y.o. established patient. Telephone visit today for acute sore throat and losing voice Sudden start 3 days ago, worsening overall. Losing voice is worst of it - throat pain is more mild. Hoarse, high pitched voice. No dysphagia, shob, doe, chest pain. Mild cough, mostly dry, occ mucus. No sinus pressure or pain, rhinorrhea, headaches, epistaxis.  Hx of mild seasonal allergies. Has tried benadryl with limited relief. Otherwise no tx tried yet.  Feeling good in regards to anxiety - has lost 14 lbs since we last saw her.  HPI   Patient Active Problem List   Diagnosis Date Noted  . Generalized anxiety disorder 06/20/2019  . Obesity 01/07/2015    Past Medical History:  Diagnosis Date  . Anxiety   . Depression   . GERD (gastroesophageal reflux disease)   . Hiatal hernia   . History of Clostridium difficile colitis    04/ 2011  .  History of duodenal ulcer    2009  . History of esophagitis   . History of panic attacks   . Nephrolithiasis    bilateral per ct 07-27-2016  L > R  (right side nonobstructive)  . Urgency of urination     Current Outpatient Medications  Medication Sig Dispense Refill  . ALPRAZolam (NIRAVAM) 0.5 MG dissolvable tablet Take 1 tablet (0.5 mg total) by mouth at bedtime as needed for anxiety. 30 tablet 0  . ampicillin (PRINCIPEN) 500 MG capsule Take 1 capsule (500 mg total) by mouth 4 (four) times daily. 20 capsule 0  . buPROPion (WELLBUTRIN XL) 300 MG 24 hr tablet Take 1 tablet (300 mg total) by mouth daily. 90 tablet 1  . busPIRone (BUSPAR) 5 MG tablet Take 1 tablet (5 mg total) by mouth 3 (three) times daily. As needed 90 tablet 1  . etonogestrel (NEXPLANON) 68 MG IMPL implant 1 each by Subdermal route once.    Marland Kitchen oxyCODONE (ROXICODONE) 5 MG immediate release tablet Take 1 tablet (5 mg total) by mouth every 6 (six) hours as needed for severe pain. 30 tablet 0  . SUMAtriptan (IMITREX) 50 MG tablet Take 1 tablet (50 mg total) by mouth every 2 (two) hours as needed for migraine. May repeat in 2 hours if headache persists or recurs. Then take 1 dose daily until headache resolves. 10 tablet 0  . tamsulosin (FLOMAX) 0.4 MG CAPS capsule Take 1 capsule (0.4 mg total) by mouth daily. 10 capsule 0  . cetirizine (ZYRTEC) 10 MG  tablet Take 1 tablet (10 mg total) by mouth daily. 30 tablet 11  . fluticasone (FLONASE) 50 MCG/ACT nasal spray Place 2 sprays into both nostrils daily. 16 g 6  . predniSONE (DELTASONE) 20 MG tablet Take 2 tablets (40 mg total) by mouth daily with breakfast. 8 tablet 0   No current facility-administered medications for this visit.    Allergies  Allergen Reactions  . Remeron [Mirtazapine] Anaphylaxis  . Flagyl [Metronidazole] Nausea And Vomiting  . Lexapro [Escitalopram Oxalate]     hallucinations  . Tramadol Itching    Social History   Socioeconomic History  . Marital  status: Single    Spouse name: Not on file  . Number of children: 1  . Years of education: 5  . Highest education level: Not on file  Occupational History  . Occupation: unemployed  Tobacco Use  . Smoking status: Former Smoker    Packs/day: 0.25    Years: 2.00    Pack years: 0.50    Quit date: 09/20/2008    Years since quitting: 11.4  . Smokeless tobacco: Never Used  Substance and Sexual Activity  . Alcohol use: Yes    Comment: occasional  . Drug use: No  . Sexual activity: Not on file    Comment: implant placed 09/ 2014  Other Topics Concern  . Not on file  Social History Narrative   Born and raised in [place], [place]. Currently resides in a house with her boyfriend and daughter. 2 dogs. Fun: Softball, bowl   Denies religious beliefs that would effect health care.    Social Determinants of Health   Financial Resource Strain:   . Difficulty of Paying Living Expenses:   Food Insecurity:   . Worried About Programme researcher, broadcasting/film/video in the Last Year:   . Barista in the Last Year:   Transportation Needs:   . Freight forwarder (Medical):   Marland Kitchen Lack of Transportation (Non-Medical):   Physical Activity:   . Days of Exercise per Week:   . Minutes of Exercise per Session:   Stress:   . Feeling of Stress :   Social Connections:   . Frequency of Communication with Friends and Family:   . Frequency of Social Gatherings with Friends and Family:   . Attends Religious Services:   . Active Member of Clubs or Organizations:   . Attends Banker Meetings:   Marland Kitchen Marital Status:   Intimate Partner Violence:   . Fear of Current or Ex-Partner:   . Emotionally Abused:   Marland Kitchen Physically Abused:   . Sexually Abused:     ROS Per hpi   Objective   Vitals as reported by the patient: Today's Vitals   03/12/20 1002  Weight: 230 lb (104.3 kg)    Krystal Humphrey was seen today for sore throat.  Diagnoses and all orders for this visit:  Acute laryngitis -     predniSONE  (DELTASONE) 20 MG tablet; Take 2 tablets (40 mg total) by mouth daily with breakfast.  Seasonal allergies -     cetirizine (ZYRTEC) 10 MG tablet; Take 1 tablet (10 mg total) by mouth daily. -     fluticasone (FLONASE) 50 MCG/ACT nasal spray; Place 2 sprays into both nostrils daily.   PLAN  Too early in course to suggest use of abx.  Prednisone burst to monitor for improvement in voice  Seasonal allergy relief  Discussed nonpharm: voice rest, humidity, hydration.  Given rtc precautions  Patient encouraged to  call clinic with any questions, comments, or concerns.   I discussed the assessment and treatment plan with the patient. The patient was provided an opportunity to ask questions and all were answered. The patient agreed with the plan and demonstrated an understanding of the instructions.   The patient was advised to call back or seek an in-person evaluation if the symptoms worsen or if the condition fails to improve as anticipated.  I provided 13 minutes of non-face-to-face time during this encounter.  Maximiano Coss, NP  Primary Care at [facility]

## 2020-04-21 ENCOUNTER — Encounter: Payer: Self-pay | Admitting: Registered Nurse

## 2020-04-21 ENCOUNTER — Telehealth (INDEPENDENT_AMBULATORY_CARE_PROVIDER_SITE_OTHER): Payer: Medicaid Other | Admitting: Registered Nurse

## 2020-04-21 ENCOUNTER — Other Ambulatory Visit: Payer: Self-pay

## 2020-04-21 DIAGNOSIS — G44209 Tension-type headache, unspecified, not intractable: Secondary | ICD-10-CM

## 2020-04-21 MED ORDER — BUTALBITAL-APAP-CAFFEINE 50-325-40 MG PO TABS: 1.0000 | ORAL_TABLET | Freq: Four times a day (QID) | ORAL | 0 refills | Status: AC | PRN

## 2020-04-21 MED ORDER — SUMATRIPTAN SUCCINATE 50 MG PO TABS: 50.0000 mg | ORAL_TABLET | ORAL | 0 refills | Status: DC | PRN

## 2020-04-21 NOTE — Patient Instructions (Signed)
° ° ° °  If you have lab work done today you will be contacted with your lab results within the next 2 weeks.  If you have not heard from us then please contact us. The fastest way to get your results is to register for My Chart. ° ° °IF you received an x-ray today, you will receive an invoice from [facility]. Please contact [facility] at [phone] with questions or concerns regarding your invoice.  ° °IF you received labwork today, you will receive an invoice from [facility]. Please contact [facility] at [phone] with questions or concerns regarding your invoice.  ° °Our billing staff will not be able to assist you with questions regarding bills from these companies. ° °You will be contacted with the lab results as soon as they are available. The fastest way to get your results is to activate your My Chart account. Instructions are located on the last page of this paperwork. If you have not heard from us regarding the results in 2 weeks, please contact this office. °  ° ° ° °

## 2020-04-21 NOTE — Progress Notes (Signed)
Telemedicine Encounter- SOAP NOTE Established Patient  This telephone encounter was conducted with the patient's (or proxy's) verbal consent via audio telecommunications: yes  Patient was instructed to have this encounter in a suitably private space; and to only have persons present to whom they give permission to participate. In addition, patient identity was confirmed by use of name plus two identifiers (DOB and address).  I discussed the limitations, risks, security and privacy concerns of performing an evaluation and management service by telephone and the availability of in person appointments. I also discussed with the patient that there may be a patient responsible charge related to this service. The patient expressed understanding and agreed to proceed.  I spent a total of 12 minutes talking with the patient or their proxy.  Chief Complaint  Patient presents with  . Migraine    patient states since yesterday she has been having migraines and  she does not know if it due to working in the heat but its very bad this time. Patient states she needed a medication refill for Sumatriptan.    Subjective   Krystal Humphrey is a 34 y.o. established patient. Telephone visit today for headache  HPI Onset two days ago Feels like band wrapped around front of head with hammer pounding on temples Intermittent, worsens as day goes on, does not wane before sleep. No visual changes, peripheral neurological symptoms, confusion, dizziness, loc, acute injury, or snoop symptoms.  Of note, has been working outside in the heat.  Otherwise, feeling well. Continues to lose weight as she has desired. Anxiety is limited.   Patient Active Problem List   Diagnosis Date Noted  . Generalized anxiety disorder 06/20/2019  . Obesity 01/07/2015    Past Medical History:  Diagnosis Date  . Anxiety   . Depression   . GERD (gastroesophageal reflux disease)   . Hiatal hernia   . History of Clostridium  difficile colitis    04/ 2011  . History of duodenal ulcer    2009  . History of esophagitis   . History of panic attacks   . Nephrolithiasis    bilateral per ct 07-27-2016  L > R  (right side nonobstructive)  . Urgency of urination     Current Outpatient Medications  Medication Sig Dispense Refill  . ALPRAZolam (NIRAVAM) 0.5 MG dissolvable tablet Take 1 tablet (0.5 mg total) by mouth at bedtime as needed for anxiety. 30 tablet 0  . ampicillin (PRINCIPEN) 500 MG capsule Take 1 capsule (500 mg total) by mouth 4 (four) times daily. 20 capsule 0  . buPROPion (WELLBUTRIN XL) 300 MG 24 hr tablet Take 1 tablet (300 mg total) by mouth daily. 90 tablet 1  . busPIRone (BUSPAR) 5 MG tablet Take 1 tablet (5 mg total) by mouth 3 (three) times daily. As needed 90 tablet 1  . cetirizine (ZYRTEC) 10 MG tablet Take 1 tablet (10 mg total) by mouth daily. 30 tablet 11  . etonogestrel (NEXPLANON) 68 MG IMPL implant 1 each by Subdermal route once.    . fluticasone (FLONASE) 50 MCG/ACT nasal spray Place 2 sprays into both nostrils daily. 16 g 6  . oxyCODONE (ROXICODONE) 5 MG immediate release tablet Take 1 tablet (5 mg total) by mouth every 6 (six) hours as needed for severe pain. 30 tablet 0  . predniSONE (DELTASONE) 20 MG tablet Take 2 tablets (40 mg total) by mouth daily with breakfast. 8 tablet 0  . SUMAtriptan (IMITREX) 50 MG tablet Take 1 tablet (  50 mg total) by mouth every 2 (two) hours as needed for migraine. May repeat in 2 hours if headache persists or recurs. Then take 1 dose daily until headache resolves. 10 tablet 0  . tamsulosin (FLOMAX) 0.4 MG CAPS capsule Take 1 capsule (0.4 mg total) by mouth daily. 10 capsule 0  . butalbital-acetaminophen-caffeine (FIORICET) 50-325-40 MG tablet Take 1 tablet by mouth every 6 (six) hours as needed for headache. 20 tablet 0   No current facility-administered medications for this visit.    Allergies  Allergen Reactions  . Remeron [Mirtazapine] Anaphylaxis   . Flagyl [Metronidazole] Nausea And Vomiting  . Lexapro [Escitalopram Oxalate]     hallucinations  . Tramadol Itching    Social History   Socioeconomic History  . Marital status: Single    Spouse name: Not on file  . Number of children: 1  . Years of education: 27  . Highest education level: Not on file  Occupational History  . Occupation: unemployed  Tobacco Use  . Smoking status: Former Smoker    Packs/day: 0.25    Years: 2.00    Pack years: 0.50    Quit date: 09/20/2008    Years since quitting: 11.5  . Smokeless tobacco: Never Used  Substance and Sexual Activity  . Alcohol use: Yes    Comment: occasional  . Drug use: No  . Sexual activity: Not on file    Comment: implant placed 09/ 2014  Other Topics Concern  . Not on file  Social History Narrative   Born and raised in [place], [place]. Currently resides in a house with her boyfriend and daughter. 2 dogs. Fun: Softball, bowl   Denies religious beliefs that would effect health care.    Social Determinants of Health   Financial Resource Strain:   . Difficulty of Paying Living Expenses:   Food Insecurity:   . Worried About Charity fundraiser in the Last Year:   . Arboriculturist in the Last Year:   Transportation Needs:   . Film/video editor (Medical):   Marland Kitchen Lack of Transportation (Non-Medical):   Physical Activity:   . Days of Exercise per Week:   . Minutes of Exercise per Session:   Stress:   . Feeling of Stress :   Social Connections:   . Frequency of Communication with Friends and Family:   . Frequency of Social Gatherings with Friends and Family:   . Attends Religious Services:   . Active Member of Clubs or Organizations:   . Attends Archivist Meetings:   Marland Kitchen Marital Status:   Intimate Partner Violence:   . Fear of Current or Ex-Partner:   . Emotionally Abused:   Marland Kitchen Physically Abused:   . Sexually Abused:     Review of Systems  Constitutional: Negative.   HENT: Negative.   Eyes:  Negative.   Respiratory: Negative.   Cardiovascular: Negative.   Gastrointestinal: Negative.   Genitourinary: Negative.   Musculoskeletal: Negative.   Skin: Negative.   Neurological: Positive for headaches. Negative for dizziness, tingling, tremors, sensory change, speech change, focal weakness, seizures, loss of consciousness and weakness.  Endo/Heme/Allergies: Negative.   Psychiatric/Behavioral: Negative.   All other systems reviewed and are negative.   Objective   Vitals as reported by the patient: There were no vitals filed for this visit.  Krystal Humphrey was seen today for migraine.  Diagnoses and all orders for this visit:  Acute non intractable tension-type headache -     SUMAtriptan (IMITREX)  50 MG tablet; Take 1 tablet (50 mg total) by mouth every 2 (two) hours as needed for migraine. May repeat in 2 hours if headache persists or recurs. Then take 1 dose daily until headache resolves. -     butalbital-acetaminophen-caffeine (FIORICET) 50-325-40 MG tablet; Take 1 tablet by mouth every 6 (six) hours as needed for headache.   PLAN  Headache continues to display tension type pattern  Will continue with sumatriptan given its efficacy in the past  Will add fioricet to take before bed prn  Red flags reviewed, pt demonstrates understanding  Patient encouraged to call clinic with any questions, comments, or concerns.  I discussed the assessment and treatment plan with the patient. The patient was provided an opportunity to ask questions and all were answered. The patient agreed with the plan and demonstrated an understanding of the instructions.   The patient was advised to call back or seek an in-person evaluation if the symptoms worsen or if the condition fails to improve as anticipated.  I provided 11 minutes of non-face-to-face time during this encounter.  Janeece Agee, NP  Primary Care at [facility]

## 2020-05-06 ENCOUNTER — Other Ambulatory Visit: Payer: Self-pay

## 2020-05-06 ENCOUNTER — Telehealth (INDEPENDENT_AMBULATORY_CARE_PROVIDER_SITE_OTHER): Payer: Medicaid Other | Admitting: Emergency Medicine

## 2020-05-06 ENCOUNTER — Ambulatory Visit
Admission: EM | Admit: 2020-05-06 | Discharge: 2020-05-06 | Disposition: A | Discharge status: Home / Self Care | Payer: 59 | Attending: Physician Assistant | Admitting: Physician Assistant

## 2020-05-06 DIAGNOSIS — L559 Sunburn, unspecified: Secondary | ICD-10-CM | POA: Diagnosis not present

## 2020-05-06 MED ORDER — SILVER SULFADIAZINE 1 % EX CREA: 1.0000 "application " | TOPICAL_CREAM | Freq: Every day | CUTANEOUS | 0 refills | Status: DC

## 2020-05-06 MED ORDER — DICLOFENAC SODIUM 1 % EX GEL: 4.0000 g | Freq: Four times a day (QID) | CUTANEOUS | 0 refills | Status: DC

## 2020-05-06 MED ORDER — NAPROXEN 500 MG PO TABS: 500.0000 mg | ORAL_TABLET | Freq: Two times a day (BID) | ORAL | 0 refills | Status: DC

## 2020-05-06 NOTE — ED Provider Notes (Signed)
[name]    CSN: [id number] Arrival date & time: 05/06/20  1835      History   Chief Complaint Chief Complaint  Patient presents with  . sunburn    HPI Krystal Humphrey is a 34 y.o. female.   34 year old female comes in for 4 day history of sunburn. No sunscreen applied. Has had pain, redness, warmth. Applying aloe vera and lidocaine     Past Medical History:  Diagnosis Date  . Anxiety   . Depression   . GERD (gastroesophageal reflux disease)   . Hiatal hernia   . History of Clostridium difficile colitis    04/ 2011  . History of duodenal ulcer    2009  . History of esophagitis   . History of panic attacks   . Nephrolithiasis    bilateral per ct 07-27-2016  L > R  (right side nonobstructive)  . Urgency of urination     Patient Active Problem List   Diagnosis Date Noted  . Generalized anxiety disorder 06/20/2019  . Obesity 01/07/2015    Past Surgical History:  Procedure Laterality Date  . CYSTOSCOPY WITH RETROGRADE PYELOGRAM, URETEROSCOPY AND STENT PLACEMENT Left 07/30/2016   Procedure: CYSTOSCOPY WITH LEFT  RETROGRADE PYELOGRAM AND LEFT STENT PLACEMENT;  Surgeon: Malen Gauze, MD;  Location: [facility];  Service: Urology;  Laterality: Left;  . CYSTOSCOPY WITH RETROGRADE PYELOGRAM, URETEROSCOPY AND STENT PLACEMENT Left 08/17/2016   Procedure: CYSTOSCOPY WITH LEFT RETROGRADE  STENT EXCHANGE, URETEROSCOPY AND STONE EXTRACTION, LASER LITHOTRIPSY AND LEFT RETROGRADE PYELOGRAM;  Surgeon: Bjorn Pippin, MD;  Location: [facility];  Service: Urology;  Laterality: Left;  . ESOPHAGOGASTRODUODENOSCOPY  last one 03-26-2009  . KNEE ARTHROSCOPY Right 03/19/2010  . TONSILLECTOMY  child    OB History    Gravida  2   Para  1   Term  1   Preterm      AB  1   Living  1     SAB  1   TAB      Ectopic      Multiple      Live Births  1            Home Medications    Prior to Admission medications   Medication Sig Start Date End  Date Taking? Authorizing Provider  ALPRAZolam (NIRAVAM) 0.5 MG dissolvable tablet Take 1 tablet (0.5 mg total) by mouth at bedtime as needed for anxiety. 12/07/19   Janeece Agee, NP  ampicillin (PRINCIPEN) 500 MG capsule Take 1 capsule (500 mg total) by mouth 4 (four) times daily. 01/02/20   Janeece Agee, NP  buPROPion (WELLBUTRIN XL) 300 MG 24 hr tablet Take 1 tablet (300 mg total) by mouth daily. 12/07/19   Janeece Agee, NP  busPIRone (BUSPAR) 5 MG tablet Take 1 tablet (5 mg total) by mouth 3 (three) times daily. As needed 09/11/19   Janeece Agee, NP  butalbital-acetaminophen-caffeine [facility]) [phone] MG tablet Take 1 tablet by mouth every 6 (six) hours as needed for headache. 04/21/20 04/21/21  Janeece Agee, NP  cetirizine (ZYRTEC) 10 MG tablet Take 1 tablet (10 mg total) by mouth daily. 03/12/20   Janeece Agee, NP  diclofenac Sodium (VOLTAREN) 1 % GEL Apply 4 g topically 4 (four) times daily. 05/06/20   Belinda Fisher, PA-C  etonogestrel (NEXPLANON) 68 MG IMPL implant 1 each by Subdermal route once.    [provider]  fluticasone (FLONASE) 50 MCG/ACT nasal spray Place 2 sprays into  both nostrils daily. 03/12/20   Maximiano Coss, NP  naproxen (NAPROSYN) 500 MG tablet Take 1 tablet (500 mg total) by mouth 2 (two) times daily. 05/06/20   Tasia Catchings, Nevin Grizzle V, PA-C  predniSONE (DELTASONE) 20 MG tablet Take 2 tablets (40 mg total) by mouth daily with breakfast. 03/12/20   Maximiano Coss, NP  silver sulfADIAZINE (SILVADENE) 1 % cream Apply 1 application topically daily. On any open wound/blisters 05/06/20   Daisey Caloca V, PA-C  SUMAtriptan (IMITREX) 50 MG tablet Take 1 tablet (50 mg total) by mouth every 2 (two) hours as needed for migraine. May repeat in 2 hours if headache persists or recurs. Then take 1 dose daily until headache resolves. 04/21/20   Maximiano Coss, NP  tamsulosin (FLOMAX) 0.4 MG CAPS capsule Take 1 capsule (0.4 mg total) by mouth daily. 01/08/20   Maximiano Coss, NP    Family  History Family History  Problem Relation Age of Onset  . Diabetes Mother   . Thyroid disease Mother   . Heart attack Father   . Diabetes Maternal Aunt   . Diabetes Maternal Uncle   . COPD Maternal Grandmother   . Crohn's disease Maternal Grandmother   . Anesthesia problems Neg Hx   . Hypotension Neg Hx   . Malignant hyperthermia Neg Hx   . Pseudochol deficiency Neg Hx     Social History Social History   Tobacco Use  . Smoking status: Former Smoker    Packs/day: 0.25    Years: 2.00    Pack years: 0.50    Quit date: 09/20/2008    Years since quitting: 11.6  . Smokeless tobacco: Never Used  Substance Use Topics  . Alcohol use: Yes    Comment: occasional  . Drug use: No     Allergies   Other, Remeron [mirtazapine], Flagyl [metronidazole], Lexapro [escitalopram oxalate], and Tramadol   Review of Systems Review of Systems  Reason unable to perform ROS: See HPI as above.     Physical Exam Triage Vital Signs ED Triage Vitals [05/06/20 1847]  Enc Vitals Group     BP (!) 155/105     Pulse Rate 85     Resp 18     Temp 98.4 F (36.9 C)     Temp Source Oral     SpO2 98 %     Weight      Height      Head Circumference      Peak Flow      Pain Score 8     Pain Loc      Pain Edu?      Excl. in [place]?    No data found.  Updated Vital Signs BP (!) 155/105 (BP Location: Left Arm)   Pulse 85   Temp 98.4 F (36.9 C) (Oral)   Resp 18   SpO2 98%   Visual Acuity Right Eye Distance:   Left Eye Distance:   Bilateral Distance:    Right Eye Near:   Left Eye Near:    Bilateral Near:     Physical Exam Constitutional:      General: She is not in acute distress.    Appearance: Normal appearance. She is well-developed. She is not toxic-appearing or diaphoretic.  HENT:     Head: Normocephalic and atraumatic.  Eyes:     Conjunctiva/sclera: Conjunctivae normal.     Pupils: Pupils are equal, round, and reactive to light.  Pulmonary:     Effort: Pulmonary effort is  normal. No  respiratory distress.     Comments: Speaking in full sentences without difficulty Musculoskeletal:     Cervical back: Normal range of motion and neck supple.  Skin:    General: Skin is warm and dry.     Comments: Diffuse burn to the upper torso and BUE. No blister/bulla/ulceration.   Neurological:     Mental Status: She is alert and oriented to person, place, and time.      UC Treatments / Results  Labs (all labs ordered are listed, but only abnormal results are displayed) Labs Reviewed - No data to display  EKG   Radiology No results found.  Procedures Procedures (including critical care time)  Medications Ordered in UC Medications - No data to display  Initial Impression / Assessment and Plan / UC Course  I have reviewed the triage vital signs and the nursing notes.  Pertinent labs & imaging results that were available during my care of the patient were reviewed by me and considered in my medical decision making (see chart for details).    Partial thickness burn from sun. Symptomatic treatment discussed. Expected course of healing discussed. Return precautions given.  Final Clinical Impressions(s) / UC Diagnoses   Final diagnoses:  Sunburn    ED Prescriptions    Medication Sig Dispense Auth. Provider   diclofenac Sodium (VOLTAREN) 1 % GEL Apply 4 g topically 4 (four) times daily. 350 g Jeanette Moffatt V, PA-C   silver sulfADIAZINE (SILVADENE) 1 % cream Apply 1 application topically daily. On any open wound/blisters 50 g Annabella Elford V, PA-C   naproxen (NAPROSYN) 500 MG tablet Take 1 tablet (500 mg total) by mouth 2 (two) times daily. 30 tablet Belinda Fisher, PA-C     PDMP not reviewed this encounter.   Belinda Fisher, PA-C 05/06/20 2249

## 2020-05-06 NOTE — ED Triage Notes (Signed)
Pt c/o sunburn to upper chest/back and arms since saturday

## 2020-05-06 NOTE — Discharge Instructions (Signed)
Start naproxen as directed. You can use diclofenac gel as needed to the most painful area, do not use on face. Silvadene to locations that are starting to blister to prevent infection. Ice compress, ice soaks. Follow up with PCP for monitoring if symptoms not improving.

## 2020-05-06 NOTE — Patient Instructions (Signed)
° ° ° °  If you have lab work done today you will be contacted with your lab results within the next 2 weeks.  If you have not heard from us then please contact us. The fastest way to get your results is to register for My Chart. ° ° °IF you received an x-ray today, you will receive an invoice from [facility]. Please contact [facility] at [phone] with questions or concerns regarding your invoice.  ° °IF you received labwork today, you will receive an invoice from [facility]. Please contact [facility] at [phone] with questions or concerns regarding your invoice.  ° °Our billing staff will not be able to assist you with questions regarding bills from these companies. ° °You will be contacted with the lab results as soon as they are available. The fastest way to get your results is to activate your My Chart account. Instructions are located on the last page of this paperwork. If you have not heard from us regarding the results in 2 weeks, please contact this office. °  ° ° ° °

## 2020-05-14 ENCOUNTER — Encounter: Payer: Self-pay | Admitting: Pulmonary Disease

## 2020-05-14 ENCOUNTER — Ambulatory Visit (INDEPENDENT_AMBULATORY_CARE_PROVIDER_SITE_OTHER): Payer: 59 | Admitting: Pulmonary Disease

## 2020-05-14 ENCOUNTER — Other Ambulatory Visit: Payer: Self-pay

## 2020-05-14 VITALS — BP 124/78 | HR 70 | Temp 98.2°F | Ht 68.0 in | Wt 231.2 lb

## 2020-05-14 DIAGNOSIS — G4733 Obstructive sleep apnea (adult) (pediatric): Secondary | ICD-10-CM

## 2020-05-14 MED ORDER — ZOLPIDEM TARTRATE 10 MG PO TABS: 10.0000 mg | ORAL_TABLET | Freq: Every evening | ORAL | 5 refills | Status: DC | PRN

## 2020-05-14 NOTE — Progress Notes (Signed)
Krystal Humphrey    [id number]    1986-04-12  Primary Care Physician:Morrow, Gerlene Burdock, NP  Referring Physician: Janeece Agee, NP [address] [place],  [place] [address]  Chief complaint:   Patient being seen for difficulty falling asleep tossing and turning a lot  HPI:  Difficulty falling asleep Multiple awakenings  Does have a history of snoring No witnessed apneas Sometimes has a dry mouth in the mornings No headaches Occasional sweats  She does suffer from allergies  Unknown with the parents snored  Sleep is described as nonrestorative Usually goes to bed between 9 and 11 PM may take about 30 minutes or more to fall asleep Final wake up time about 5:30 in the morning  She has used trazodone in the past-this did not really help She has tried Xanax but does not like using this Ativan also did help  Outpatient Encounter Medications as of 05/14/2020  Medication Sig  . ALPRAZolam (NIRAVAM) 0.5 MG dissolvable tablet Take 1 tablet (0.5 mg total) by mouth at bedtime as needed for anxiety.  . busPIRone (BUSPAR) 5 MG tablet Take 1 tablet (5 mg total) by mouth 3 (three) times daily. As needed  . butalbital-acetaminophen-caffeine (FIORICET) 50-325-40 MG tablet Take 1 tablet by mouth every 6 (six) hours as needed for headache.  . cetirizine (ZYRTEC) 10 MG tablet Take 1 tablet (10 mg total) by mouth daily.  . diclofenac Sodium (VOLTAREN) 1 % GEL Apply 4 g topically 4 (four) times daily.  Marland Kitchen etonogestrel (NEXPLANON) 68 MG IMPL implant 1 each by Subdermal route once.  . naproxen (NAPROSYN) 500 MG tablet Take 1 tablet (500 mg total) by mouth 2 (two) times daily.  . silver sulfADIAZINE (SILVADENE) 1 % cream Apply 1 application topically daily. On any open wound/blisters  . SUMAtriptan (IMITREX) 50 MG tablet Take 1 tablet (50 mg total) by mouth every 2 (two) hours as needed for migraine. May repeat in 2 hours if headache persists or recurs. Then take 1 dose daily until headache  resolves.  . [DISCONTINUED] ampicillin (PRINCIPEN) 500 MG capsule Take 1 capsule (500 mg total) by mouth 4 (four) times daily.  . [DISCONTINUED] buPROPion (WELLBUTRIN XL) 300 MG 24 hr tablet Take 1 tablet (300 mg total) by mouth daily.  . [DISCONTINUED] fluticasone (FLONASE) 50 MCG/ACT nasal spray Place 2 sprays into both nostrils daily.  . [DISCONTINUED] predniSONE (DELTASONE) 20 MG tablet Take 2 tablets (40 mg total) by mouth daily with breakfast.  . [DISCONTINUED] tamsulosin (FLOMAX) 0.4 MG CAPS capsule Take 1 capsule (0.4 mg total) by mouth daily.  Marland Kitchen zolpidem (AMBIEN) 10 MG tablet Take 1 tablet (10 mg total) by mouth at bedtime as needed for sleep.   No facility-administered encounter medications on file as of 05/14/2020.    Allergies as of 05/14/2020 - Review Complete 05/14/2020  Allergen Reaction Noted  . Other Anaphylaxis and Itching 05/06/2020  . Remeron [mirtazapine] Anaphylaxis 09/01/2011  . Flagyl [metronidazole] Nausea And Vomiting 05/07/2009  . Lexapro [escitalopram oxalate]  06/12/2019  . Tramadol Itching 01/07/2015    Past Medical History:  Diagnosis Date  . Anxiety   . Depression   . GERD (gastroesophageal reflux disease)   . Hiatal hernia   . History of Clostridium difficile colitis    04/ 2011  . History of duodenal ulcer    2009  . History of esophagitis   . History of panic attacks   . Nephrolithiasis    bilateral per ct 07-27-2016  L > R  (right side nonobstructive)  . Urgency of urination     Past Surgical History:  Procedure Laterality Date  . CYSTOSCOPY WITH RETROGRADE PYELOGRAM, URETEROSCOPY AND STENT PLACEMENT Left 07/30/2016   Procedure: CYSTOSCOPY WITH LEFT  RETROGRADE PYELOGRAM AND LEFT STENT PLACEMENT;  Surgeon: Malen Gauze, MD;  Location: [facility];  Service: Urology;  Laterality: Left;  . CYSTOSCOPY WITH RETROGRADE PYELOGRAM, URETEROSCOPY AND STENT PLACEMENT Left 08/17/2016   Procedure: CYSTOSCOPY WITH LEFT RETROGRADE  STENT EXCHANGE,  URETEROSCOPY AND STONE EXTRACTION, LASER LITHOTRIPSY AND LEFT RETROGRADE PYELOGRAM;  Surgeon: Bjorn Pippin, MD;  Location: [facility];  Service: Urology;  Laterality: Left;  . ESOPHAGOGASTRODUODENOSCOPY  last one 03-26-2009  . KNEE ARTHROSCOPY Right 03/19/2010  . TONSILLECTOMY  child    Family History  Problem Relation Age of Onset  . Diabetes Mother   . Thyroid disease Mother   . Heart attack Father   . Diabetes Maternal Aunt   . Diabetes Maternal Uncle   . COPD Maternal Grandmother   . Crohn's disease Maternal Grandmother   . Anesthesia problems Neg Hx   . Hypotension Neg Hx   . Malignant hyperthermia Neg Hx   . Pseudochol deficiency Neg Hx     Social History   Socioeconomic History  . Marital status: Single    Spouse name: Not on file  . Number of children: 1  . Years of education: 69  . Highest education level: Not on file  Occupational History  . Occupation: unemployed  Tobacco Use  . Smoking status: Former Smoker    Packs/day: 0.25    Years: 2.00    Pack years: 0.50    Quit date: 09/20/2008    Years since quitting: 11.6  . Smokeless tobacco: Never Used  Substance and Sexual Activity  . Alcohol use: Yes    Comment: occasional  . Drug use: No  . Sexual activity: Not on file    Comment: implant placed 09/ 2014  Other Topics Concern  . Not on file  Social History Narrative   Born and raised in [place], [place]. Currently resides in a house with her boyfriend and daughter. 2 dogs. Fun: Softball, bowl   Denies religious beliefs that would effect health care.    Social Determinants of Health   Financial Resource Strain:   . Difficulty of Paying Living Expenses:   Food Insecurity:   . Worried About Programme researcher, broadcasting/film/video in the Last Year:   . Barista in the Last Year:   Transportation Needs:   . Freight forwarder (Medical):   Marland Kitchen Lack of Transportation (Non-Medical):   Physical Activity:   . Days of Exercise per Week:   . Minutes of  Exercise per Session:   Stress:   . Feeling of Stress :   Social Connections:   . Frequency of Communication with Friends and Family:   . Frequency of Social Gatherings with Friends and Family:   . Attends Religious Services:   . Active Member of Clubs or Organizations:   . Attends Banker Meetings:   Marland Kitchen Marital Status:   Intimate Partner Violence:   . Fear of Current or Ex-Partner:   . Emotionally Abused:   Marland Kitchen Physically Abused:   . Sexually Abused:     Review of Systems  Constitutional: Positive for fatigue.  Psychiatric/Behavioral: Positive for sleep disturbance.    Vitals:   05/14/20 1026  BP: 124/78  Pulse: 70  Temp: 98.2 F (  36.8 C)  SpO2: 97%     Physical Exam Constitutional:      Appearance: She is obese.  HENT:     Nose: Congestion present.  Eyes:     General:        Right eye: No discharge.        Left eye: No discharge.     Conjunctiva/sclera: Conjunctivae normal.     Pupils: Pupils are equal, round, and reactive to light.  Cardiovascular:     Rate and Rhythm: Normal rate and regular rhythm.     Heart sounds: Normal heart sounds. No murmur heard.   Pulmonary:     Effort: Pulmonary effort is normal. No respiratory distress.     Breath sounds: Normal breath sounds. No stridor. No wheezing, rhonchi or rales.  Musculoskeletal:     Cervical back: No rigidity or tenderness.  Neurological:     General: No focal deficit present.     Mental Status: She is alert.  Psychiatric:        Mood and Affect: Mood normal.   No flowsheet data found. Epworth Sleepiness Scale of 10  Assessment:  Sleep onset and sleep maintenance insomnia  Moderate probability of significant obstructive sleep apnea  Pathophysiology of sleep disordered breathing discussed with the patient Treatment options for sleep disordered breathing discussed  Plan/Recommendations: We will prescribe Ambien for sleep onset and maintenance insomnia  Schedule a home sleep study  for obstructive sleep apnea  Importance of weight management discussed  Follow-up in 2 to 3 months   Sherrilyn Rist MD [facility] 05/14/2020, 10:48 AM  CC: Maximiano Coss, NP

## 2020-05-14 NOTE — Patient Instructions (Addendum)
Sleep onset insomnia Possible obstructive sleep apnea  We will schedule you for home sleep study  Ambien for sleep onset insomnia  Call with significant concerns  Follow-up in 2 to 3 months for

## 2020-05-19 ENCOUNTER — Telehealth: Payer: Self-pay | Admitting: Registered Nurse

## 2020-05-19 NOTE — Telephone Encounter (Signed)
Patient is calling in with High Blood pressure getting Hot out of no where  BP last week at urgent care 155/109 . Patient reports not feeling right . Patient is off anxiety meds  For a month   Pt does does not have a BP machine does not know what it is now / Please advise   Advised to go to urgent care

## 2020-05-20 ENCOUNTER — Encounter: Payer: Self-pay | Admitting: Registered Nurse

## 2020-05-20 ENCOUNTER — Other Ambulatory Visit: Payer: Self-pay

## 2020-05-20 ENCOUNTER — Telehealth (INDEPENDENT_AMBULATORY_CARE_PROVIDER_SITE_OTHER): Payer: Medicaid Other | Admitting: Registered Nurse

## 2020-05-20 DIAGNOSIS — I10 Essential (primary) hypertension: Secondary | ICD-10-CM | POA: Diagnosis not present

## 2020-05-20 MED ORDER — AMLODIPINE BESYLATE 2.5 MG PO TABS: 2.5000 mg | ORAL_TABLET | Freq: Every day | ORAL | 3 refills | Status: DC

## 2020-05-20 NOTE — Patient Instructions (Signed)
° ° ° °  If you have lab work done today you will be contacted with your lab results within the next 2 weeks.  If you have not heard from us then please contact us. The fastest way to get your results is to register for My Chart. ° ° °IF you received an x-ray today, you will receive an invoice from [facility]. Please contact [facility] at [phone] with questions or concerns regarding your invoice.  ° °IF you received labwork today, you will receive an invoice from [facility]. Please contact [facility] at [phone] with questions or concerns regarding your invoice.  ° °Our billing staff will not be able to assist you with questions regarding bills from these companies. ° °You will be contacted with the lab results as soon as they are available. The fastest way to get your results is to activate your My Chart account. Instructions are located on the last page of this paperwork. If you have not heard from us regarding the results in 2 weeks, please contact this office. °  ° ° ° °

## 2020-05-20 NOTE — Progress Notes (Signed)
Telemedicine Encounter- SOAP NOTE Established Patient  This telephone encounter was conducted with the patient's (or proxy's) verbal consent via audio telecommunications: yes  Patient was instructed to have this encounter in a suitably private space; and to only have persons present to whom they give permission to participate. In addition, patient identity was confirmed by use of name plus two identifiers (DOB and address).  I discussed the limitations, risks, security and privacy concerns of performing an evaluation and management service by telephone and the availability of in person appointments. I also discussed with the patient that there may be a patient responsible charge related to this service. The patient expressed understanding and agreed to proceed.  I spent a total of 13 minutes talking with the patient or their proxy.  Chief Complaint  Patient presents with  . Hypertension    Patient states her BP 155/109 at the urgent care . Sunday people kept asking her was she okay because her face was red and she looked off. And then yesterday patient was feeling light headed , off and weak.    Subjective   Krystal Humphrey is a 34 y.o. established patient. Telephone visit today for htn  HPI Was seen at urgent care recently for bad sunburn / sun poisoning. Was told her bp was 155/109, unfortunately felt that this was not adequately addressed.  Next day looked flushed, had multiple people ask if she was ok, but she felt fine. Day after felt dizzy, lightheaded, malaised, fatigued, mild headache.   Does not have home cuff Denies shob doe visual changes palpitations claudication and dependent edema. No hx of htn - has had one elevated reading recently   Patient Active Problem List   Diagnosis Date Noted  . Generalized anxiety disorder 06/20/2019  . Obesity 01/07/2015    Past Medical History:  Diagnosis Date  . Anxiety   . Depression   . GERD (gastroesophageal reflux disease)   .  Hiatal hernia   . History of Clostridium difficile colitis    04/ 2011  . History of duodenal ulcer    2009  . History of esophagitis   . History of panic attacks   . Nephrolithiasis    bilateral per ct 07-27-2016  L > R  (right side nonobstructive)  . Urgency of urination     Current Outpatient Medications  Medication Sig Dispense Refill  . ALPRAZolam (NIRAVAM) 0.5 MG dissolvable tablet Take 1 tablet (0.5 mg total) by mouth at bedtime as needed for anxiety. 30 tablet 0  . busPIRone (BUSPAR) 5 MG tablet Take 1 tablet (5 mg total) by mouth 3 (three) times daily. As needed 90 tablet 1  . butalbital-acetaminophen-caffeine (FIORICET) 50-325-40 MG tablet Take 1 tablet by mouth every 6 (six) hours as needed for headache. 20 tablet 0  . cetirizine (ZYRTEC) 10 MG tablet Take 1 tablet (10 mg total) by mouth daily. 30 tablet 11  . diclofenac Sodium (VOLTAREN) 1 % GEL Apply 4 g topically 4 (four) times daily. 350 g 0  . etonogestrel (NEXPLANON) 68 MG IMPL implant 1 each by Subdermal route once.    . silver sulfADIAZINE (SILVADENE) 1 % cream Apply 1 application topically daily. On any open wound/blisters 50 g 0  . SUMAtriptan (IMITREX) 50 MG tablet Take 1 tablet (50 mg total) by mouth every 2 (two) hours as needed for migraine. May repeat in 2 hours if headache persists or recurs. Then take 1 dose daily until headache resolves. 10 tablet 0  .  zolpidem (AMBIEN) 10 MG tablet Take 1 tablet (10 mg total) by mouth at bedtime as needed for sleep. 30 tablet 5  . naproxen (NAPROSYN) 500 MG tablet Take 1 tablet (500 mg total) by mouth 2 (two) times daily. (Patient not taking: Reported on 05/20/2020) 30 tablet 0   No current facility-administered medications for this visit.    Allergies  Allergen Reactions  . Other Anaphylaxis and Itching  . Remeron [Mirtazapine] Anaphylaxis  . Flagyl [Metronidazole] Nausea And Vomiting  . Lexapro [Escitalopram Oxalate]     hallucinations  . Tramadol Itching    Social  History   Socioeconomic History  . Marital status: Single    Spouse name: Not on file  . Number of children: 1  . Years of education: 9  . Highest education level: Not on file  Occupational History  . Occupation: unemployed  Tobacco Use  . Smoking status: Former Smoker    Packs/day: 0.25    Years: 2.00    Pack years: 0.50    Quit date: 09/20/2008    Years since quitting: 11.6  . Smokeless tobacco: Never Used  Substance and Sexual Activity  . Alcohol use: Yes    Comment: occasional  . Drug use: No  . Sexual activity: Not on file    Comment: implant placed 09/ 2014  Other Topics Concern  . Not on file  Social History Narrative   Born and raised in [place], [place]. Currently resides in a house with her boyfriend and daughter. 2 dogs. Fun: Softball, bowl   Denies religious beliefs that would effect health care.    Social Determinants of Health   Financial Resource Strain:   . Difficulty of Paying Living Expenses:   Food Insecurity:   . Worried About Programme researcher, broadcasting/film/video in the Last Year:   . Barista in the Last Year:   Transportation Needs:   . Freight forwarder (Medical):   Marland Kitchen Lack of Transportation (Non-Medical):   Physical Activity:   . Days of Exercise per Week:   . Minutes of Exercise per Session:   Stress:   . Feeling of Stress :   Social Connections:   . Frequency of Communication with Friends and Family:   . Frequency of Social Gatherings with Friends and Family:   . Attends Religious Services:   . Active Member of Clubs or Organizations:   . Attends Banker Meetings:   Marland Kitchen Marital Status:   Intimate Partner Violence:   . Fear of Current or Ex-Partner:   . Emotionally Abused:   Marland Kitchen Physically Abused:   . Sexually Abused:     ROS Per hpi   Objective   Vitals as reported by the patient: There were no vitals filed for this visit.  Krystal Humphrey was seen today for hypertension.  Diagnoses and all orders for this visit:  Essential  hypertension -     amLODipine (NORVASC) 2.5 MG tablet; Take 1 tablet (2.5 mg total) by mouth daily.   PLAN  Feel that her symptoms are likely r/t dehydration d/t sun exposure rather than any cv origin  Will start on low dose amlodipine for multiple elevated bp readings  Return in 2-4 weeks for bp check  Patient encouraged to call clinic with any questions, comments, or concerns.  I discussed the assessment and treatment plan with the patient. The patient was provided an opportunity to ask questions and all were answered. The patient agreed with the plan and demonstrated an understanding  of the instructions.   The patient was advised to call back or seek an in-person evaluation if the symptoms worsen or if the condition fails to improve as anticipated.  I provided 13 minutes of non-face-to-face time during this encounter.  Janeece Agee, NP  Primary Care at [facility]

## 2020-06-06 ENCOUNTER — Other Ambulatory Visit: Payer: Self-pay

## 2020-06-06 ENCOUNTER — Ambulatory Visit (INDEPENDENT_AMBULATORY_CARE_PROVIDER_SITE_OTHER): Payer: 59 | Admitting: Registered Nurse

## 2020-06-06 ENCOUNTER — Encounter: Payer: Self-pay | Admitting: Registered Nurse

## 2020-06-06 VITALS — BP 143/87 | HR 70 | Temp 98.0°F | Resp 18 | Ht 68.0 in | Wt 229.2 lb

## 2020-06-06 DIAGNOSIS — N390 Urinary tract infection, site not specified: Secondary | ICD-10-CM | POA: Diagnosis not present

## 2020-06-06 DIAGNOSIS — F411 Generalized anxiety disorder: Secondary | ICD-10-CM | POA: Diagnosis not present

## 2020-06-06 DIAGNOSIS — I10 Essential (primary) hypertension: Secondary | ICD-10-CM

## 2020-06-06 LAB — POCT URINALYSIS DIP (CLINITEK)
Bilirubin, UA: NEGATIVE
Glucose, UA: NEGATIVE mg/dL
Ketones, POC UA: NEGATIVE mg/dL
Nitrite, UA: NEGATIVE
POC PROTEIN,UA: 30 — AB
Spec Grav, UA: 1.025 (ref 1.010–1.025)
Urobilinogen, UA: 1 E.U./dL
pH, UA: 7.5 (ref 5.0–8.0)

## 2020-06-06 MED ORDER — NITROFURANTOIN MONOHYD MACRO 100 MG PO CAPS: 100.0000 mg | ORAL_CAPSULE | Freq: Two times a day (BID) | ORAL | 0 refills | Status: AC

## 2020-06-06 MED ORDER — AMLODIPINE BESYLATE 5 MG PO TABS: 5.0000 mg | ORAL_TABLET | Freq: Every day | ORAL | 3 refills | Status: DC

## 2020-06-06 MED ORDER — ALPRAZOLAM 0.5 MG PO TBDP: 0.5000 mg | ORAL_TABLET | Freq: Every evening | ORAL | 0 refills | Status: DC | PRN

## 2020-06-06 NOTE — Progress Notes (Signed)
Established Patient Office Visit  Subjective:  Patient ID: Krystal Humphrey, female    DOB: 1986/05/31  Age: 34 y.o. MRN: [id number]  CC:  Chief Complaint  Patient presents with   Follow-up    Patient states that she is here for her 6 month follow up on her medical condition and a possible UTI patient states its some burning when urinating and itching at times. Patient also noticed some discharge now but states its getting alot better.    HPI Krystal Humphrey presents for follow up on anxiety. Taking Buspar 32m PO tid prn and alprazolam 0.549mPO qhs PRN with good effect. Feeling much better than when I initially met her around 1 year ago. Hoping to keep this regimen  Frequent urination for around one week. Some dysuria. Off color urine. Has had uti before, this feels similar  Also notes mild vaginal discharge. Took an OTC yeast infection tx with some effect, but not totally resolved. Does not feel this is bacterial.  Otherwise feeling well. Needs refills on alprazolam  Past Medical History:  Diagnosis Date   Anxiety    Depression    GERD (gastroesophageal reflux disease)    Hiatal hernia    History of Clostridium difficile colitis    04/ 2011   History of duodenal ulcer    2009   History of esophagitis    History of panic attacks    Nephrolithiasis    bilateral per ct 07-27-2016  L > R  (right side nonobstructive)   Urgency of urination     Past Surgical History:  Procedure Laterality Date   CYSTOSCOPY WITH RETROGRADE PYELOGRAM, URETEROSCOPY AND STENT PLACEMENT Left 07/30/2016   Procedure: CYSTOSCOPY WITH LEFT  RETROGRADE PYELOGRAM AND LEFT STENT PLACEMENT;  Surgeon: PaCleon GustinMD;  Location: [facility];  Service: Urology;  Laterality: Left;   CYSTOSCOPY WITH RETROGRADE PYELOGRAM, URETEROSCOPY AND STENT PLACEMENT Left 08/17/2016   Procedure: CYSTOSCOPY WITH LEFT RETROGRADE  STENT EXCHANGE, URETEROSCOPY AND STONE EXTRACTION, LASER LITHOTRIPSY AND LEFT RETROGRADE  PYELOGRAM;  Surgeon: JoIrine SealMD;  Location[facility] Service: Urology;  Laterality: Left;   ESOPHAGOGASTRODUODENOSCOPY  last one 03-26-2009   KNEE ARTHROSCOPY Right 03/19/2010   TONSILLECTOMY  child    Family History  Problem Relation Age of Onset   Diabetes Mother    Thyroid disease Mother    Heart attack Father    Diabetes Maternal Aunt    Diabetes Maternal Uncle    COPD Maternal Grandmother    Crohn's disease Maternal Grandmother    Anesthesia problems Neg Hx    Hypotension Neg Hx    Malignant hyperthermia Neg Hx    Pseudochol deficiency Neg Hx     Social History   Socioeconomic History   Marital status: Single    Spouse name: Not on file   Number of children: 1   Years of education: 12   Highest education level: Not on file  Occupational History   Occupation: unemployed  Tobacco Use   Smoking status: Former Smoker    Packs/day: 0.25    Years: 2.00    Pack years: 0.50    Quit date: 09/20/2008    Years since quitting: 11.7   Smokeless tobacco: Never Used  Substance and Sexual Activity   Alcohol use: Yes    Comment: occasional   Drug use: No   Sexual activity: Not on file    Comment: implant placed 09/ 2014  Other Topics Concern   Not on  file  Social History Narrative   Born and raised in [place], [place]. Currently resides in a house with her boyfriend and daughter. 2 dogs. Fun: Softball, bowl   Denies religious beliefs that would effect health care.    Social Determinants of Health   Financial Resource Strain:    Difficulty of Paying Living Expenses:   Food Insecurity:    Worried About Charity fundraiser in the Last Year:    Arboriculturist in the Last Year:   Transportation Needs:    Film/video editor (Medical):    Lack of Transportation (Non-Medical):   Physical Activity:    Days of Exercise per Week:    Minutes of Exercise per Session:   Stress:    Feeling of Stress :   Social  Connections:    Frequency of Communication with Friends and Family:    Frequency of Social Gatherings with Friends and Family:    Attends Religious Services:    Active Member of Clubs or Organizations:    Attends Music therapist:    Marital Status:   Intimate Partner Violence:    Fear of Current or Ex-Partner:    Emotionally Abused:    Physically Abused:    Sexually Abused:     Outpatient Medications Prior to Visit  Medication Sig Dispense Refill   ALPRAZolam (NIRAVAM) 0.5 MG dissolvable tablet Take 1 tablet (0.5 mg total) by mouth at bedtime as needed for anxiety. 30 tablet 0   amLODipine (NORVASC) 2.5 MG tablet Take 1 tablet (2.5 mg total) by mouth daily. 90 tablet 3   busPIRone (BUSPAR) 5 MG tablet Take 1 tablet (5 mg total) by mouth 3 (three) times daily. As needed 90 tablet 1   butalbital-acetaminophen-caffeine (FIORICET) 50-325-40 MG tablet Take 1 tablet by mouth every 6 (six) hours as needed for headache. 20 tablet 0   cetirizine (ZYRTEC) 10 MG tablet Take 1 tablet (10 mg total) by mouth daily. 30 tablet 11   diclofenac Sodium (VOLTAREN) 1 % GEL Apply 4 g topically 4 (four) times daily. 350 g 0   etonogestrel (NEXPLANON) 68 MG IMPL implant 1 each by Subdermal route once.     naproxen (NAPROSYN) 500 MG tablet Take 1 tablet (500 mg total) by mouth 2 (two) times daily. (Patient not taking: Reported on 05/20/2020) 30 tablet 0   silver sulfADIAZINE (SILVADENE) 1 % cream Apply 1 application topically daily. On any open wound/blisters (Patient not taking: Reported on 06/06/2020) 50 g 0   SUMAtriptan (IMITREX) 50 MG tablet Take 1 tablet (50 mg total) by mouth every 2 (two) hours as needed for migraine. May repeat in 2 hours if headache persists or recurs. Then take 1 dose daily until headache resolves. (Patient not taking: Reported on 06/06/2020) 10 tablet 0   zolpidem (AMBIEN) 10 MG tablet Take 1 tablet (10 mg total) by mouth at bedtime as needed for sleep.  (Patient not taking: Reported on 06/06/2020) 30 tablet 5   No facility-administered medications prior to visit.    Allergies  Allergen Reactions   Other Anaphylaxis and Itching   Remeron [Mirtazapine] Anaphylaxis   Flagyl [Metronidazole] Nausea And Vomiting   Lexapro [Escitalopram Oxalate]     hallucinations   Tramadol Itching    ROS Review of Systems  Constitutional: Negative.   HENT: Negative.   Eyes: Negative.   Respiratory: Negative.   Cardiovascular: Negative.   Gastrointestinal: Negative.   Endocrine: Negative.   Genitourinary: Negative.   Musculoskeletal: Negative.  Skin: Negative.   Allergic/Immunologic: Negative.   Neurological: Negative.   Hematological: Negative.   Psychiatric/Behavioral: Negative.   All other systems reviewed and are negative.     Objective:    Physical Exam Constitutional:      General: She is not in acute distress.    Appearance: Normal appearance. She is normal weight. She is not ill-appearing, toxic-appearing or diaphoretic.  Cardiovascular:     Rate and Rhythm: Normal rate and regular rhythm.  Skin:    General: Skin is warm and dry.     Coloration: Skin is not jaundiced or pale.     Findings: No bruising, erythema, lesion or rash.  Neurological:     General: No focal deficit present.     Mental Status: She is alert and oriented to person, place, and time. Mental status is at baseline.  Psychiatric:        Mood and Affect: Mood normal.        Behavior: Behavior normal.        Thought Content: Thought content normal.        Judgment: Judgment normal.     BP (!) 143/87    Pulse 70    Temp 98 F (36.7 C) (Temporal)    Resp 18    Ht '5\' 8"'$  (1.727 m)    Wt 229 lb 3.2 oz (104 kg)    SpO2 98%    BMI 34.85 kg/m  Wt Readings from Last 3 Encounters:  06/06/20 229 lb 3.2 oz (104 kg)  05/14/20 231 lb 3.2 oz (104.9 kg)  03/12/20 230 lb (104.3 kg)     There are no preventive care reminders to display for this patient.  There  are no preventive care reminders to display for this patient.  Lab Results  Component Value Date   TSH 1.77 05/17/2018   Lab Results  Component Value Date   WBC 12.2 (H) 12/31/2019   HGB 14.9 12/31/2019   HCT 43.7 12/31/2019   MCV 91 12/31/2019   PLT 397 12/31/2019   Lab Results  Component Value Date   NA 137 12/31/2019   K 4.6 12/31/2019   CO2 20 12/31/2019   GLUCOSE 99 12/31/2019   BUN 11 12/31/2019   CREATININE 0.78 12/31/2019   BILITOT 0.3 12/31/2019   ALKPHOS 79 12/31/2019   AST 19 12/31/2019   ALT 39 (H) 12/31/2019   PROT 6.8 12/31/2019   ALBUMIN 4.4 12/31/2019   CALCIUM 9.5 12/31/2019   ANIONGAP 8 02/07/2019   GFR 74.53 05/17/2018   No results found for: CHOL No results found for: HDL No results found for: LDLCALC No results found for: TRIG No results found for: CHOLHDL No results found for: HGBA1C    Assessment & Plan:   Problem List Items Addressed This Visit      Other   Generalized anxiety disorder   Relevant Medications   ALPRAZolam (NIRAVAM) 0.5 MG dissolvable tablet    Other Visit Diagnoses    Urinary tract infection without hematuria, site unspecified    -  Primary   Relevant Medications   nitrofurantoin, macrocrystal-monohydrate, (MACROBID) 100 MG capsule   Other Relevant Orders   POCT URINALYSIS DIP (CLINITEK) (Completed)   Urine Culture   Essential hypertension       Relevant Medications   amLODipine (NORVASC) 5 MG tablet      No orders of the defined types were placed in this encounter.   Follow-up: No follow-ups on file.   PLAN  Refill  alprazolam  Increase amlodipine to 61m PO qd. bp mildly elevated today.  UA shows likely UTI with many leuks. Will give macrobid 1042mPO bid for 5 days.  Patient encouraged to call clinic with any questions, comments, or concerns.  RiMaximiano CossNP

## 2020-06-06 NOTE — Patient Instructions (Signed)
° ° ° °  If you have lab work done today you will be contacted with your lab results within the next 2 weeks.  If you have not heard from us then please contact us. The fastest way to get your results is to register for My Chart. ° ° °IF you received an x-ray today, you will receive an invoice from [facility]. Please contact [facility] at [phone] with questions or concerns regarding your invoice.  ° °IF you received labwork today, you will receive an invoice from [facility]. Please contact [facility] at [phone] with questions or concerns regarding your invoice.  ° °Our billing staff will not be able to assist you with questions regarding bills from these companies. ° °You will be contacted with the lab results as soon as they are available. The fastest way to get your results is to activate your My Chart account. Instructions are located on the last page of this paperwork. If you have not heard from us regarding the results in 2 weeks, please contact this office. °  ° ° ° °

## 2020-06-10 ENCOUNTER — Encounter: Payer: Self-pay | Admitting: Registered Nurse

## 2020-06-10 ENCOUNTER — Other Ambulatory Visit: Payer: Self-pay

## 2020-06-10 ENCOUNTER — Ambulatory Visit (INDEPENDENT_AMBULATORY_CARE_PROVIDER_SITE_OTHER): Payer: 59 | Admitting: Registered Nurse

## 2020-06-10 VITALS — BP 131/82 | HR 71 | Temp 98.0°F | Resp 18 | Ht 68.0 in | Wt 229.0 lb

## 2020-06-10 DIAGNOSIS — Z3046 Encounter for surveillance of implantable subdermal contraceptive: Secondary | ICD-10-CM | POA: Diagnosis not present

## 2020-06-10 DIAGNOSIS — G44209 Tension-type headache, unspecified, not intractable: Secondary | ICD-10-CM | POA: Diagnosis not present

## 2020-06-10 MED ORDER — CYCLOBENZAPRINE HCL 5 MG PO TABS: 5.0000 mg | ORAL_TABLET | Freq: Three times a day (TID) | ORAL | 1 refills | Status: DC | PRN

## 2020-06-10 NOTE — Patient Instructions (Signed)

## 2020-06-12 ENCOUNTER — Encounter: Payer: Self-pay | Admitting: Registered Nurse

## 2020-06-13 ENCOUNTER — Other Ambulatory Visit: Payer: Self-pay | Admitting: Registered Nurse

## 2020-06-13 DIAGNOSIS — Z3009 Encounter for other general counseling and advice on contraception: Secondary | ICD-10-CM

## 2020-06-13 MED ORDER — LEVONORGESTREL-ETHINYL ESTRAD 0.1-20 MG-MCG PO TABS: 1.0000 | ORAL_TABLET | Freq: Every day | ORAL | 4 refills | Status: DC

## 2020-06-18 ENCOUNTER — Encounter: Payer: Self-pay | Admitting: Registered Nurse

## 2020-06-24 ENCOUNTER — Ambulatory Visit: Payer: 59 | Admitting: Registered Nurse

## 2020-06-24 ENCOUNTER — Ambulatory Visit (INDEPENDENT_AMBULATORY_CARE_PROVIDER_SITE_OTHER): Payer: 59 | Admitting: Registered Nurse

## 2020-06-24 ENCOUNTER — Other Ambulatory Visit: Payer: Self-pay

## 2020-06-24 VITALS — BP 152/88 | HR 76 | Temp 98.2°F | Ht 68.0 in | Wt 235.2 lb

## 2020-06-24 DIAGNOSIS — N3000 Acute cystitis without hematuria: Secondary | ICD-10-CM

## 2020-06-24 DIAGNOSIS — R110 Nausea: Secondary | ICD-10-CM

## 2020-06-24 DIAGNOSIS — R1032 Left lower quadrant pain: Secondary | ICD-10-CM

## 2020-06-24 LAB — POCT CBC
Granulocyte percent: 65.1 %G (ref 37–80)
HCT, POC: 43 % — AB (ref 29–41)
Hemoglobin: 14.6 g/dL (ref 11–14.6)
Lymph, poc: 3.3 (ref 0.6–3.4)
MCH, POC: 31.9 pg — AB (ref 27–31.2)
MCHC: 34.1 g/dL (ref 31.8–35.4)
MCV: 93.7 fL (ref 76–111)
MID (cbc): 0.4 (ref 0–0.9)
MPV: 8.5 fL (ref 0–99.8)
POC Granulocyte: 7 — AB (ref 2–6.9)
POC LYMPH PERCENT: 31 %L (ref 10–50)
POC MID %: 3.9 %M (ref 0–12)
Platelet Count, POC: 332 10*3/uL (ref 142–424)
RBC: 4.59 M/uL (ref 4.04–5.48)
RDW, POC: 13.3 %
WBC: 10.8 10*3/uL — AB (ref 4.6–10.2)

## 2020-06-24 LAB — POCT URINALYSIS DIP (MANUAL ENTRY)
Bilirubin, UA: NEGATIVE
Glucose, UA: NEGATIVE mg/dL
Ketones, POC UA: NEGATIVE mg/dL
Nitrite, UA: NEGATIVE
Protein Ur, POC: NEGATIVE mg/dL
Spec Grav, UA: 1.025 (ref 1.010–1.025)
Urobilinogen, UA: 0.2 E.U./dL
pH, UA: 6.5 (ref 5.0–8.0)

## 2020-06-24 MED ORDER — NITROFURANTOIN MONOHYD MACRO 100 MG PO CAPS: 100.0000 mg | ORAL_CAPSULE | Freq: Two times a day (BID) | ORAL | 0 refills | Status: DC

## 2020-06-24 MED ORDER — SULFAMETHOXAZOLE-TRIMETHOPRIM 800-160 MG PO TABS: 1.0000 | ORAL_TABLET | Freq: Two times a day (BID) | ORAL | 0 refills | Status: DC

## 2020-06-24 MED ORDER — ONDANSETRON HCL 4 MG PO TABS: 4.0000 mg | ORAL_TABLET | Freq: Three times a day (TID) | ORAL | 0 refills | Status: DC | PRN

## 2020-06-24 NOTE — Patient Instructions (Signed)
° ° ° °  If you have lab work done today you will be contacted with your lab results within the next 2 weeks.  If you have not heard from us then please contact us. The fastest way to get your results is to register for My Chart. ° ° °IF you received an x-ray today, you will receive an invoice from [facility]. Please contact [facility] at [phone] with questions or concerns regarding your invoice.  ° °IF you received labwork today, you will receive an invoice from [facility]. Please contact [facility] at [phone] with questions or concerns regarding your invoice.  ° °Our billing staff will not be able to assist you with questions regarding bills from these companies. ° °You will be contacted with the lab results as soon as they are available. The fastest way to get your results is to activate your My Chart account. Instructions are located on the last page of this paperwork. If you have not heard from us regarding the results in 2 weeks, please contact this office. °  ° ° ° °

## 2020-06-25 ENCOUNTER — Encounter: Payer: Self-pay | Admitting: Registered Nurse

## 2020-06-25 ENCOUNTER — Ambulatory Visit
Admission: EM | Admit: 2020-06-25 | Discharge: 2020-06-25 | Disposition: A | Discharge status: Home / Self Care | Payer: 59 | Attending: Physician Assistant | Admitting: Physician Assistant

## 2020-06-25 ENCOUNTER — Other Ambulatory Visit: Payer: Self-pay

## 2020-06-25 DIAGNOSIS — N3090 Cystitis, unspecified without hematuria: Secondary | ICD-10-CM

## 2020-06-25 DIAGNOSIS — N898 Other specified noninflammatory disorders of vagina: Secondary | ICD-10-CM

## 2020-06-25 DIAGNOSIS — M545 Low back pain, unspecified: Secondary | ICD-10-CM

## 2020-06-25 LAB — URINE CULTURE

## 2020-06-25 MED ORDER — FLUCONAZOLE 150 MG PO TABS
150.0000 mg | ORAL_TABLET | Freq: Every day | ORAL | 0 refills | Status: DC
Start: 2020-06-25 — End: 2022-02-24

## 2020-06-25 MED ORDER — FAMOTIDINE 20 MG PO TABS
20.0000 mg | ORAL_TABLET | Freq: Every day | ORAL | 0 refills | Status: DC
Start: 2020-06-25 — End: 2022-06-07

## 2020-06-25 MED ORDER — KETOROLAC TROMETHAMINE 15 MG/ML IJ SOLN
15.0000 mg | Freq: Once | INTRAMUSCULAR | Status: AC
  Administered 2020-06-25: 15 mg via INTRAMUSCULAR

## 2020-06-25 NOTE — Discharge Instructions (Signed)
No signs of kidney infection at this time. Continue bactrim as directed. Diflucan to cover for yeast. Start pepcid to protect your stomach and continue zofran for nausea/vomiting. If worsening symptoms, fever, unable to urinate, go to the emergency department for further evaluation

## 2020-06-25 NOTE — ED Provider Notes (Signed)
[name]    CSN: [id number] Arrival date & time: 06/25/20  1600      History   Chief Complaint Chief Complaint  Patient presents with  . Back Pain    HPI Krystal Humphrey is a 34 y.o. female.   34 year old female comes in for worsening symptoms after being treated for UTI for the past month.  States at first was started on Macrobid, if symptoms do not improve.  She saw her PCP yesterday, and was started on Bactrim, as well as Macrobid at the same time.  States at work today, had one episode of vomiting and was sent home.  Has urinary urgency, and sees blood with wiping, but not gross hematuria.  Has had vaginal discharge with itching.  Bilateral lower abdominal pain that radiates to the back.  Denies epigastric pain.  Denies fever, chills, body aches.  Denies diarrhea.  Nexplanon removed recently, and immediately started on OCPs, has not had cycles since.  Not currently sexually active.     Past Medical History:  Diagnosis Date  . Anxiety   . Depression   . GERD (gastroesophageal reflux disease)   . Hiatal hernia   . History of Clostridium difficile colitis    04/ 2011  . History of duodenal ulcer    2009  . History of esophagitis   . History of panic attacks   . Nephrolithiasis    bilateral per ct 07-27-2016  L > R  (right side nonobstructive)  . Urgency of urination     Patient Active Problem List   Diagnosis Date Noted  . Generalized anxiety disorder 06/20/2019  . Obesity 01/07/2015    Past Surgical History:  Procedure Laterality Date  . CYSTOSCOPY WITH RETROGRADE PYELOGRAM, URETEROSCOPY AND STENT PLACEMENT Left 07/30/2016   Procedure: CYSTOSCOPY WITH LEFT  RETROGRADE PYELOGRAM AND LEFT STENT PLACEMENT;  Surgeon: Malen Gauze, MD;  Location: [facility];  Service: Urology;  Laterality: Left;  . CYSTOSCOPY WITH RETROGRADE PYELOGRAM, URETEROSCOPY AND STENT PLACEMENT Left 08/17/2016   Procedure: CYSTOSCOPY WITH LEFT RETROGRADE  STENT EXCHANGE,  URETEROSCOPY AND STONE EXTRACTION, LASER LITHOTRIPSY AND LEFT RETROGRADE PYELOGRAM;  Surgeon: Bjorn Pippin, MD;  Location: [facility];  Service: Urology;  Laterality: Left;  . ESOPHAGOGASTRODUODENOSCOPY  last one 03-26-2009  . KNEE ARTHROSCOPY Right 03/19/2010  . TONSILLECTOMY  child    OB History    Gravida  2   Para  1   Term  1   Preterm      AB  1   Living  1     SAB  1   TAB      Ectopic      Multiple      Live Births  1            Home Medications    Prior to Admission medications   Medication Sig Start Date End Date Taking? Authorizing Provider  ALPRAZolam (NIRAVAM) 0.5 MG dissolvable tablet Take 1 tablet (0.5 mg total) by mouth at bedtime as needed for anxiety. 06/06/20   Janeece Agee, NP  amLODipine (NORVASC) 5 MG tablet Take 1 tablet (5 mg total) by mouth daily. 06/06/20   Janeece Agee, NP  busPIRone (BUSPAR) 5 MG tablet Take 1 tablet (5 mg total) by mouth 3 (three) times daily. As needed 09/11/19   Janeece Agee, NP  butalbital-acetaminophen-caffeine [facility]) [phone] MG tablet Take 1 tablet by mouth every 6 (six) hours as needed for headache. 04/21/20 04/21/21  Janeece Agee, NP  cetirizine (ZYRTEC) 10 MG tablet Take 1 tablet (10 mg total) by mouth daily. 03/12/20   Janeece Agee, NP  cyclobenzaprine (FLEXERIL) 5 MG tablet Take 1 tablet (5 mg total) by mouth 3 (three) times daily as needed for muscle spasms. 06/10/20   Janeece Agee, NP  diclofenac Sodium (VOLTAREN) 1 % GEL Apply 4 g topically 4 (four) times daily. 05/06/20   Cathie Hoops, Dyna Figuereo V, PA-C  famotidine (PEPCID) 20 MG tablet Take 1 tablet (20 mg total) by mouth daily. 06/25/20   Cathie Hoops, Anthony Tamburo V, PA-C  fluconazole (DIFLUCAN) 150 MG tablet Take 1 tablet (150 mg total) by mouth daily. Take second dose 72 hours later if symptoms still persists. 06/25/20   Belinda Fisher, PA-C  levonorgestrel-ethinyl estradiol (ALESSE) 0.1-20 MG-MCG tablet Take 1 tablet by mouth daily. 06/13/20   Janeece Agee, NP    ondansetron (ZOFRAN) 4 MG tablet Take 1 tablet (4 mg total) by mouth every 8 (eight) hours as needed for nausea or vomiting. 06/24/20   Janeece Agee, NP  sulfamethoxazole-trimethoprim (BACTRIM DS) 800-160 MG tablet Take 1 tablet by mouth 2 (two) times daily. 06/24/20   Janeece Agee, NP    Family History Family History  Problem Relation Age of Onset  . Diabetes Mother   . Thyroid disease Mother   . Heart attack Father   . Diabetes Maternal Aunt   . Diabetes Maternal Uncle   . COPD Maternal Grandmother   . Crohn's disease Maternal Grandmother   . Anesthesia problems Neg Hx   . Hypotension Neg Hx   . Malignant hyperthermia Neg Hx   . Pseudochol deficiency Neg Hx     Social History Social History   Tobacco Use  . Smoking status: Former Smoker    Packs/day: 0.25    Years: 2.00    Pack years: 0.50    Quit date: 09/20/2008    Years since quitting: 11.7  . Smokeless tobacco: Never Used  Substance Use Topics  . Alcohol use: Yes    Comment: occasional  . Drug use: No     Allergies   Other, Remeron [mirtazapine], Flagyl [metronidazole], Lexapro [escitalopram oxalate], and Tramadol   Review of Systems Review of Systems  Reason unable to perform ROS: See HPI as above.     Physical Exam Triage Vital Signs ED Triage Vitals  Enc Vitals Group     BP 06/25/20 1643 (!) 139/92     Pulse Rate 06/25/20 1643 87     Resp 06/25/20 1643 18     Temp 06/25/20 1643 98.4 F (36.9 C)     Temp Source 06/25/20 1643 Oral     SpO2 06/25/20 1643 98 %     Weight --      Height --      Head Circumference --      Peak Flow --      Pain Score 06/25/20 1653 8     Pain Loc --      Pain Edu? --      Excl. in GC? --    No data found.  Updated Vital Signs BP (!) 139/92 (BP Location: Left Arm)   Pulse 87   Temp 98.4 F (36.9 C) (Oral)   Resp 18   SpO2 98%   Visual Acuity Right Eye Distance:   Left Eye Distance:   Bilateral Distance:    Right Eye Near:   Left Eye Near:     Bilateral Near:     Physical Exam Constitutional:  General: She is not in acute distress.    Appearance: She is well-developed. She is not ill-appearing, toxic-appearing or diaphoretic.  HENT:     Head: Normocephalic and atraumatic.  Eyes:     Conjunctiva/sclera: Conjunctivae normal.     Pupils: Pupils are equal, round, and reactive to light.  Cardiovascular:     Rate and Rhythm: Normal rate and regular rhythm.  Pulmonary:     Effort: Pulmonary effort is normal. No respiratory distress.     Comments: LCTAB Abdominal:     General: Bowel sounds are normal.     Palpations: Abdomen is soft.     Tenderness: There is no abdominal tenderness. There is no right CVA tenderness, left CVA tenderness, guarding or rebound.  Musculoskeletal:     Cervical back: Normal range of motion and neck supple.  Skin:    General: Skin is warm and dry.  Neurological:     Mental Status: She is alert and oriented to person, place, and time.  Psychiatric:        Behavior: Behavior normal.        Judgment: Judgment normal.      UC Treatments / Results  Labs (all labs ordered are listed, but only abnormal results are displayed) Labs Reviewed - No data to display  EKG   Radiology No results found.  Procedures Procedures (including critical care time)  Medications Ordered in UC Medications  ketorolac (TORADOL) 15 MG/ML injection 15 mg (15 mg Intramuscular Given 06/25/20 1750)    Initial Impression / Assessment and Plan / UC Course  I have reviewed the triage vital signs and the nursing notes.  Pertinent labs & imaging results that were available during my care of the patient were reviewed by me and considered in my medical decision making (see chart for details).    No current PCP note for review. No urine culture from 1 month ago, but urine culture was collected from yesterday's visit, no results at this time. Patient without CVA tenderness, fever, low suspicion for pyelonephritis.   Will provide symptomatic treatment with Toradol injection. Pepcid given repeat ABX use.  Diflucan for possible yeast.  Patient to continue Bactrim as directed.  Follow-up with PCP for further evaluation if symptoms do not improving.  Return precautions given.  Final Clinical Impressions(s) / UC Diagnoses   Final diagnoses:  Cystitis  Vaginal discharge  Acute bilateral low back pain without sciatica    ED Prescriptions    Medication Sig Dispense Auth. Provider   fluconazole (DIFLUCAN) 150 MG tablet Take 1 tablet (150 mg total) by mouth daily. Take second dose 72 hours later if symptoms still persists. 2 tablet Calel Pisarski V, PA-C   famotidine (PEPCID) 20 MG tablet Take 1 tablet (20 mg total) by mouth daily. 10 tablet Belinda Fisher, PA-C     PDMP not reviewed this encounter.   Belinda Fisher, PA-C 06/25/20 1815

## 2020-06-25 NOTE — ED Triage Notes (Signed)
Pt states has been tx'd for an UTI for over a month now. States started bactrim and microbid yesterday for tx #2. States at work today and vomit x1 and sent home. Pt c/o severe lower back pain.

## 2020-06-30 ENCOUNTER — Other Ambulatory Visit: Payer: Self-pay

## 2020-06-30 ENCOUNTER — Encounter (HOSPITAL_COMMUNITY): Payer: Self-pay

## 2020-06-30 ENCOUNTER — Emergency Department (HOSPITAL_COMMUNITY)
Admission: EM | Admit: 2020-06-30 | Discharge: 2020-06-30 | Disposition: A | Discharge status: Home / Self Care | Payer: 59 | Attending: Emergency Medicine | Admitting: Emergency Medicine

## 2020-06-30 ENCOUNTER — Emergency Department (HOSPITAL_COMMUNITY): Payer: 59

## 2020-06-30 ENCOUNTER — Encounter: Payer: Self-pay | Admitting: Registered Nurse

## 2020-06-30 ENCOUNTER — Ambulatory Visit: Payer: Self-pay | Admitting: *Deleted

## 2020-06-30 DIAGNOSIS — R319 Hematuria, unspecified: Secondary | ICD-10-CM | POA: Diagnosis present

## 2020-06-30 DIAGNOSIS — Z79899 Other long term (current) drug therapy: Secondary | ICD-10-CM | POA: Insufficient documentation

## 2020-06-30 DIAGNOSIS — Z87891 Personal history of nicotine dependence: Secondary | ICD-10-CM | POA: Diagnosis not present

## 2020-06-30 DIAGNOSIS — N739 Female pelvic inflammatory disease, unspecified: Secondary | ICD-10-CM | POA: Diagnosis not present

## 2020-06-30 LAB — COMPREHENSIVE METABOLIC PANEL
ALT: 37 U/L (ref 0–44)
AST: 28 U/L (ref 15–41)
Albumin: 4.5 g/dL (ref 3.5–5.0)
Alkaline Phosphatase: 58 U/L (ref 38–126)
Anion gap: 12 (ref 5–15)
BUN: 11 mg/dL (ref 6–20)
CO2: 25 mmol/L (ref 22–32)
Calcium: 9.2 mg/dL (ref 8.9–10.3)
Chloride: 103 mmol/L (ref 98–111)
Creatinine, Ser: 0.74 mg/dL (ref 0.44–1.00)
GFR calc Af Amer: >60 mL/min (ref >60)
GFR calc non Af Amer: >60 mL/min (ref >60)
Glucose, Bld: 74 mg/dL (ref 70–99)
Potassium: 3.8 mmol/L (ref 3.5–5.1)
Sodium: 140 mmol/L (ref 135–145)
Total Bilirubin: 0.9 mg/dL (ref 0.3–1.2)
Total Protein: 8.4 g/dL — ABNORMAL HIGH (ref 6.5–8.1)

## 2020-06-30 LAB — URINALYSIS, ROUTINE W REFLEX MICROSCOPIC
Bilirubin Urine: NEGATIVE
Glucose, UA: NEGATIVE mg/dL
Ketones, ur: NEGATIVE mg/dL
Nitrite: NEGATIVE
Protein, ur: 30 mg/dL — AB
RBC / HPF: >50 RBC/hpf — ABNORMAL HIGH (ref 0–5)
Specific Gravity, Urine: 1.019 (ref 1.005–1.030)
WBC, UA: >50 WBC/hpf — ABNORMAL HIGH (ref 0–5)
pH: 6 (ref 5.0–8.0)

## 2020-06-30 LAB — I-STAT BETA HCG BLOOD, ED (MC, WL, AP ONLY): I-stat hCG, quantitative: <5 m[IU]/mL (ref <5)

## 2020-06-30 LAB — CBC
HCT: 46.8 % — ABNORMAL HIGH (ref 36.0–46.0)
Hemoglobin: 15.6 g/dL — ABNORMAL HIGH (ref 12.0–15.0)
MCH: 31.3 pg (ref 26.0–34.0)
MCHC: 33.3 g/dL (ref 30.0–36.0)
MCV: 93.8 fL (ref 80.0–100.0)
Platelets: 358 10*3/uL (ref 150–400)
RBC: 4.99 MIL/uL (ref 3.87–5.11)
RDW: 13 % (ref 11.5–15.5)
WBC: 13.8 10*3/uL — ABNORMAL HIGH (ref 4.0–10.5)
nRBC: 0 % (ref 0.0–0.2)

## 2020-06-30 LAB — WET PREP, GENITAL
Sperm: NONE SEEN
Yeast Wet Prep HPF POC: NONE SEEN

## 2020-06-30 MED ORDER — CLINDAMYCIN HCL 300 MG PO CAPS
300.0000 mg | ORAL_CAPSULE | Freq: Two times a day (BID) | ORAL | 0 refills | Status: AC
Start: 2020-06-30 — End: 2020-07-07

## 2020-06-30 MED ORDER — HYDROMORPHONE HCL 1 MG/ML IJ SOLN
0.5000 mg | Freq: Once | INTRAMUSCULAR | Status: AC
  Administered 2020-06-30: 0.5 mg via INTRAVENOUS
  Filled 2020-06-30: qty 1

## 2020-06-30 MED ORDER — KETOROLAC TROMETHAMINE 15 MG/ML IJ SOLN
15.0000 mg | Freq: Once | INTRAMUSCULAR | Status: AC
  Administered 2020-06-30: 15 mg via INTRAVENOUS
  Filled 2020-06-30: qty 1

## 2020-06-30 MED ORDER — ONDANSETRON HCL 4 MG/2ML IJ SOLN
4.0000 mg | Freq: Once | INTRAMUSCULAR | Status: AC
  Administered 2020-06-30: 4 mg via INTRAVENOUS
  Filled 2020-06-30: qty 2

## 2020-06-30 MED ORDER — MORPHINE SULFATE (PF) 4 MG/ML IV SOLN
4.0000 mg | Freq: Once | INTRAVENOUS | Status: AC
  Administered 2020-06-30: 4 mg via INTRAVENOUS
  Filled 2020-06-30: qty 1

## 2020-06-30 MED ORDER — SODIUM CHLORIDE 0.9 % IV SOLN
1.0000 g | Freq: Once | INTRAVENOUS | Status: AC
  Administered 2020-06-30: 1 g via INTRAVENOUS
  Filled 2020-06-30: qty 10

## 2020-06-30 MED ORDER — DEXTROSE 5 % IV SOLN
500.0000 mg | Freq: Once | INTRAVENOUS | Status: DC
  Filled 2020-06-30: qty 500

## 2020-06-30 MED ORDER — OXYCODONE-ACETAMINOPHEN 5-325 MG PO TABS: 1.0000 | ORAL_TABLET | Freq: Four times a day (QID) | ORAL | 0 refills | Status: DC | PRN

## 2020-06-30 MED ORDER — ONDANSETRON HCL 4 MG PO TABS
4.0000 mg | ORAL_TABLET | Freq: Four times a day (QID) | ORAL | 0 refills | Status: DC
Start: 2020-06-30 — End: 2020-12-11

## 2020-06-30 MED ORDER — AZITHROMYCIN 250 MG PO TABS
1000.0000 mg | ORAL_TABLET | Freq: Once | ORAL | Status: AC
  Administered 2020-06-30: 1000 mg via ORAL
  Filled 2020-06-30: qty 4

## 2020-06-30 MED ORDER — LACTATED RINGERS IV BOLUS
1000.0000 mL | Freq: Once | INTRAVENOUS | Status: AC
  Administered 2020-06-30: 1000 mL via INTRAVENOUS

## 2020-06-30 MED ORDER — DOXYCYCLINE HYCLATE 100 MG PO CAPS
100.0000 mg | ORAL_CAPSULE | Freq: Two times a day (BID) | ORAL | 0 refills | Status: DC
Start: 2020-06-30 — End: 2020-12-11

## 2020-06-30 NOTE — ED Triage Notes (Signed)
Patient was recommended to be seen in ED by PCP today.  Diagnosed with UTI July 9th and given antibiotic and finished course that has not helped.  Patient then went back to PCP and treated for yeast infection and given another round of antibiotics.   C/O hematuria and intermitten left flank pain.  8/10 pain       Hx. kidney stones

## 2020-06-30 NOTE — Telephone Encounter (Signed)
Patient would like for someone to explain her results from a urine culture. Patient states she is still showing some blood when using bathroom and wiping and needs to know what other options she has.  Call to patient- she states she has been seen at [facility] for this and she has been treated twice- with Macrobid and bactrim. She has also been treated for yeast infection. Patient is still seeing blood when wipe, having back pain and pain at urethra. Call to office- no openings with any provider- advised patient to return to UC/call urologist for follow up.  Reason for Disposition . [1] Pain or burning with passing urine AND [2] side (flank) or back pain present  Answer Assessment - Initial Assessment Questions 1. COLOR of URINE: "Describe the color of the urine."  (e.g., tea-colored, pink, red, blood clots, bloody)     bright red blood when wipes- not turning toilet water red 2. ONSET: "When did the bleeding start?"      1 month 3. EPISODES: "How many times has there been blood in the urine?" or "How many times today?"     Not every time- this morning  4. PAIN with URINATION: "Is there any pain with passing your urine?" If Yes, ask: "How bad is the pain?"  (Scale 1-10; or mild, moderate, severe)    - MILD - complains slightly about urination hurting    - MODERATE - interferes with normal activities      - SEVERE - excruciating, unwilling or unable to urinate because of the pain      Sometimes it burns- seems irritated 5. FEVER: "Do you have a fever?" If Yes, ask: "What is your temperature, how was it measured, and when did it start?"     no 6. ASSOCIATED SYMPTOMS: "Are you passing urine more frequently than usual?"     no 7. OTHER SYMPTOMS: "Do you have any other symptoms?" (e.g., back/flank pain, abdominal pain, vomiting)     Back pain, history of kidney stones 8. PREGNANCY: "Is there any chance you are pregnant?" "When was your last menstrual period?"     n/a-no- LMP- recent  Nexplanon  Protocols used: URINE - BLOOD IN-A-AH

## 2020-06-30 NOTE — Progress Notes (Signed)
Acute Office Visit  Subjective:    Patient ID: Krystal Humphrey, female    DOB: 1986-10-13, 34 y.o.   MRN: [id number]  Chief Complaint  Patient presents with   Flank Pain    Pt stated that she has been experiencing some Lt abdominal pain and Lt low back pain since 06/23/2020    HPI Patient is in today for llq abdominal pain and left flank pain Was seen recently for uti and is concerned that it maybe never cleared up  Some malaise but no fevers, chills, nvd, or other systemic symptoms One episode of gross hematuria that has since resolved.  No other concerns.   Past Medical History:  Diagnosis Date   Anxiety    Depression    GERD (gastroesophageal reflux disease)    Hiatal hernia    History of Clostridium difficile colitis    04/ 2011   History of duodenal ulcer    2009   History of esophagitis    History of panic attacks    Nephrolithiasis    bilateral per ct 07-27-2016  L > R  (right side nonobstructive)   Urgency of urination     Past Surgical History:  Procedure Laterality Date   CYSTOSCOPY WITH RETROGRADE PYELOGRAM, URETEROSCOPY AND STENT PLACEMENT Left 07/30/2016   Procedure: CYSTOSCOPY WITH LEFT  RETROGRADE PYELOGRAM AND LEFT STENT PLACEMENT;  Surgeon: Malen GauzePatrick L McKenzie, MD;  Location: [facility];  Service: Urology;  Laterality: Left;   CYSTOSCOPY WITH RETROGRADE PYELOGRAM, URETEROSCOPY AND STENT PLACEMENT Left 08/17/2016   Procedure: CYSTOSCOPY WITH LEFT RETROGRADE  STENT EXCHANGE, URETEROSCOPY AND STONE EXTRACTION, LASER LITHOTRIPSY AND LEFT RETROGRADE PYELOGRAM;  Surgeon: Bjorn PippinJohn Wrenn, MD;  [facility];  Service: Urology;  Laterality: Left;   ESOPHAGOGASTRODUODENOSCOPY  last one 03-26-2009   KNEE ARTHROSCOPY Right 03/19/2010   TONSILLECTOMY  child    Family History  Problem Relation Age of Onset   Diabetes Mother    Thyroid disease Mother    Heart attack Father    Diabetes Maternal Aunt    Diabetes Maternal Uncle     COPD Maternal Grandmother    Crohn's disease Maternal Grandmother    Anesthesia problems Neg Hx    Hypotension Neg Hx    Malignant hyperthermia Neg Hx    Pseudochol deficiency Neg Hx     Social History   Socioeconomic History   Marital status: Single    Spouse name: Not on file   Number of children: 1   Years of education: 12   Highest education level: Not on file  Occupational History   Occupation: unemployed  Tobacco Use   Smoking status: Former Smoker    Packs/day: 0.25    Years: 2.00    Pack years: 0.50    Quit date: 09/20/2008    Years since quitting: 11.7   Smokeless tobacco: Never Used  Substance and Sexual Activity   Alcohol use: Yes    Comment: occasional   Drug use: No   Sexual activity: Not on file    Comment: implant placed 09/ 2014  Other Topics Concern   Not on file  Social History Narrative   [place], [place]. Currently resides in a house with her boyfriend and daughter. 2 dogs. Fun: Softball, bowl   Denies religious beliefs that would effect health care.    Social Determinants of Health   Financial Resource Strain:    Difficulty of Paying Living Expenses:   Food Insecurity:    Worried  About Running Out of Food in the Last Year:    Ran Out of Food in the Last Year:   Transportation Needs:    Freight forwarder (Medical):    Lack of Transportation (Non-Medical):   Physical Activity:    Days of Exercise per Week:    Minutes of Exercise per Session:   Stress:    Feeling of Stress :   Social Connections:    Frequency of Communication with Friends and Family:    Frequency of Social Gatherings with Friends and Family:    Attends Religious Services:    Active Member of Clubs or Organizations:    Attends Engineer, structural:    Marital Status:   Intimate Partner Violence:    Fear of Current or Ex-Partner:    Emotionally Abused:    Physically Abused:    Sexually Abused:      Outpatient Medications Prior to Visit  Medication Sig Dispense Refill   ALPRAZolam (NIRAVAM) 0.5 MG dissolvable tablet Take 1 tablet (0.5 mg total) by mouth at bedtime as needed for anxiety. 30 tablet 0   amLODipine (NORVASC) 5 MG tablet Take 1 tablet (5 mg total) by mouth daily. 90 tablet 3   busPIRone (BUSPAR) 5 MG tablet Take 1 tablet (5 mg total) by mouth 3 (three) times daily. As needed 90 tablet 1   butalbital-acetaminophen-caffeine (FIORICET) 50-325-40 MG tablet Take 1 tablet by mouth every 6 (six) hours as needed for headache. 20 tablet 0   cetirizine (ZYRTEC) 10 MG tablet Take 1 tablet (10 mg total) by mouth daily. 30 tablet 11   cyclobenzaprine (FLEXERIL) 5 MG tablet Take 1 tablet (5 mg total) by mouth 3 (three) times daily as needed for muscle spasms. 30 tablet 1   diclofenac Sodium (VOLTAREN) 1 % GEL Apply 4 g topically 4 (four) times daily. 350 g 0   levonorgestrel-ethinyl estradiol (ALESSE) 0.1-20 MG-MCG tablet Take 1 tablet by mouth daily. 84 tablet 4   etonogestrel (NEXPLANON) 68 MG IMPL implant 1 each by Subdermal route once.     No facility-administered medications prior to visit.    Allergies  Allergen Reactions   Other Anaphylaxis and Itching   Remeron [Mirtazapine] Anaphylaxis   Flagyl [Metronidazole] Nausea And Vomiting   Lexapro [Escitalopram Oxalate]     hallucinations   Tramadol Itching    Review of Systems  Constitutional: Negative.   HENT: Negative.   Eyes: Negative.   Respiratory: Negative.   Cardiovascular: Negative.   Gastrointestinal: Positive for abdominal pain. Negative for abdominal distention, anal bleeding, blood in stool, constipation, diarrhea, nausea, rectal pain and vomiting.  Endocrine: Negative.   Genitourinary: Positive for dysuria, flank pain, hematuria and pelvic pain. Negative for decreased urine volume, difficulty urinating, dyspareunia, enuresis, frequency, genital sores, menstrual problem, urgency, vaginal bleeding,  vaginal discharge and vaginal pain.  Musculoskeletal: Positive for back pain. Negative for arthralgias, gait problem, joint swelling, myalgias, neck pain and neck stiffness.  Skin: Negative.   Allergic/Immunologic: Negative.   Neurological: Negative.   Hematological: Negative.   Psychiatric/Behavioral: Negative.   All other systems reviewed and are negative.      Objective:    Physical Exam Constitutional:      General: She is not in acute distress.    Appearance: Normal appearance. She is obese. She is not ill-appearing, toxic-appearing or diaphoretic.  Cardiovascular:     Rate and Rhythm: Normal rate and regular rhythm.  Skin:    General: Skin is warm and dry.  Capillary Refill: Capillary refill takes less than 2 seconds.     Coloration: Skin is not jaundiced or pale.     Findings: No bruising, erythema, lesion or rash.  Neurological:     General: No focal deficit present.     Mental Status: She is alert and oriented to person, place, and time. Mental status is at baseline.  Psychiatric:        Mood and Affect: Mood normal.        Behavior: Behavior normal.        Thought Content: Thought content normal.        Judgment: Judgment normal.     BP (!) 152/88 (BP Location: Right Arm, Patient Position: Sitting, Cuff Size: Large)    Pulse 76    Temp 98.2 F (36.8 C) (Temporal)    Ht 5\' 8"  (1.727 m)    Wt (!) 235 lb 3.2 oz (106.7 kg)    SpO2 98%    BMI 35.76 kg/m  Wt Readings from Last 3 Encounters:  06/24/20 (!) 235 lb 3.2 oz (106.7 kg)  06/10/20 229 lb (103.9 kg)  06/06/20 229 lb 3.2 oz (104 kg)    Health Maintenance Due  Topic Date Due   COVID-19 Vaccine (1) Never done   PAP SMEAR-Modifier  10/16/2019   INFLUENZA VACCINE  06/29/2020    There are no preventive care reminders to display for this patient.   Lab Results  Component Value Date   TSH 1.77 05/17/2018   Lab Results  Component Value Date   WBC 10.8 (A) 06/24/2020   HGB 14.6 06/24/2020   HCT  43.0 (A) 06/24/2020   MCV 93.7 06/24/2020   PLT 397 12/31/2019   Lab Results  Component Value Date   NA 137 12/31/2019   K 4.6 12/31/2019   CO2 20 12/31/2019   GLUCOSE 99 12/31/2019   BUN 11 12/31/2019   CREATININE 0.78 12/31/2019   BILITOT 0.3 12/31/2019   ALKPHOS 79 12/31/2019   AST 19 12/31/2019   ALT 39 (H) 12/31/2019   PROT 6.8 12/31/2019   ALBUMIN 4.4 12/31/2019   CALCIUM 9.5 12/31/2019   ANIONGAP 8 02/07/2019   GFR 74.53 05/17/2018   No results found for: CHOL No results found for: HDL No results found for: LDLCALC No results found for: TRIG No results found for: CHOLHDL No results found for: 05/19/2018     Assessment & Plan:   Problem List Items Addressed This Visit    None    Visit Diagnoses    Left lower quadrant abdominal pain    -  Primary   Relevant Orders   POCT urinalysis dipstick (Completed)   POCT CBC (Completed)   Acute cystitis without hematuria       Relevant Medications   sulfamethoxazole-trimethoprim (BACTRIM DS) 800-160 MG tablet   Other Relevant Orders   Urine Culture (Completed)   POCT CBC (Completed)   Nausea without vomiting       Relevant Medications   ondansetron (ZOFRAN) 4 MG tablet       Meds ordered this encounter  Medications   DISCONTD: nitrofurantoin, macrocrystal-monohydrate, (MACROBID) 100 MG capsule    Sig: Take 1 capsule (100 mg total) by mouth 2 (two) times daily for 5 days.    Dispense:  10 capsule    Refill:  0    Order Specific Question:   Supervising Provider    Answer:   [id number] Myles Lipps   sulfamethoxazole-trimethoprim (BACTRIM DS) 800-160 MG tablet  Sig: Take 1 tablet by mouth 2 (two) times daily.    Dispense:  6 tablet    Refill:  0    Order Specific Question:   Supervising Provider    Answer:   Myles Lipps [[id number]]   ondansetron (ZOFRAN) 4 MG tablet    Sig: Take 1 tablet (4 mg total) by mouth every 8 (eight) hours as needed for nausea or vomiting.    Dispense:  20 tablet    Refill:   0    Order Specific Question:   Supervising Provider    Answer:   Myles Lipps [[id number]]   PLAN  Will send bactrim po bid for three days  Send out culture  zofran 4mg  po q8h prn for nausea/malaise  Return precautions given  Patient encouraged to call clinic with any questions, comments, or concerns.   , NP

## 2020-06-30 NOTE — Telephone Encounter (Signed)
FYI

## 2020-06-30 NOTE — ED Notes (Signed)
Patient transported to CT 

## 2020-07-01 ENCOUNTER — Encounter: Payer: Self-pay | Admitting: Registered Nurse

## 2020-07-01 ENCOUNTER — Telehealth: Payer: Self-pay | Admitting: Registered Nurse

## 2020-07-01 LAB — GC/CHLAMYDIA PROBE AMP (~~LOC~~) NOT AT ARMC
Chlamydia: NEGATIVE
Comment: NEGATIVE
Comment: NORMAL
Neisseria Gonorrhea: NEGATIVE

## 2020-07-01 NOTE — Telephone Encounter (Signed)
Patient calls to ask if you could review her ER prescribed medications; clindamycin and doxycycline for wet prep results. She's concerned they may be too much abx.

## 2020-07-01 NOTE — Telephone Encounter (Signed)
Pt called in to ask pcp if he can review her recent wet prep. Pt went to ED yesterday and was prescribed doxycycline and clindamycin. Pt was also given antibiotics via IV. Pt concerned it is too much. Please advise

## 2020-07-02 LAB — URINE CULTURE

## 2020-07-02 NOTE — Telephone Encounter (Signed)
Krystal Humphrey is aware of this and it has been forwarded too him.

## 2020-07-06 ENCOUNTER — Encounter: Payer: Self-pay | Admitting: Registered Nurse

## 2020-07-06 NOTE — ED Provider Notes (Signed)
[facility]-EMERGENCY DEPT Provider Note   CSN: [id number] Arrival date & time: 06/30/20  1158     History Chief Complaint  Patient presents with  . Flank Pain  . Hematuria    Krystal Humphrey is a 34 y.o. female.  HPI   34 year old female with lower abdominal/pelvic pain.  She was diagnosed with a UTI on July 9.  She reports that she was treated with antibiotics but feels like it has not helped.  She had repeat evaluation and told that she might have a yeast infection and was treated with this as well.  She is presenting today because symptoms not sniffily improved.  Pain is in lower abdomen and sometimes radiates to the left flank.  Urethral pain with urination.  Discharge.  No fevers or chills.  Past Medical History:  Diagnosis Date  . Anxiety   . Depression   . GERD (gastroesophageal reflux disease)   . Hiatal hernia   . History of Clostridium difficile colitis    04/ 2011  . History of duodenal ulcer    2009  . History of esophagitis   . History of panic attacks   . Nephrolithiasis    bilateral per ct 07-27-2016  L > R  (right side nonobstructive)  . Urgency of urination     Patient Active Problem List   Diagnosis Date Noted  . Generalized anxiety disorder 06/20/2019  . Obesity 01/07/2015    Past Surgical History:  Procedure Laterality Date  . CYSTOSCOPY WITH RETROGRADE PYELOGRAM, URETEROSCOPY AND STENT PLACEMENT Left 07/30/2016   Procedure: CYSTOSCOPY WITH LEFT  RETROGRADE PYELOGRAM AND LEFT STENT PLACEMENT;  Surgeon: Malen GauzePatrick L McKenzie, MD;  Location: [facility];  Service: Urology;  Laterality: Left;  . CYSTOSCOPY WITH RETROGRADE PYELOGRAM, URETEROSCOPY AND STENT PLACEMENT Left 08/17/2016   Procedure: CYSTOSCOPY WITH LEFT RETROGRADE  STENT EXCHANGE, URETEROSCOPY AND STONE EXTRACTION, LASER LITHOTRIPSY AND LEFT RETROGRADE PYELOGRAM;  Surgeon: Bjorn PippinJohn Wrenn, MD;  [facility];  Service: Urology;  Laterality: Left;  .  ESOPHAGOGASTRODUODENOSCOPY  last one 03-26-2009  . KNEE ARTHROSCOPY Right 03/19/2010  . TONSILLECTOMY  child     OB History    Gravida  2   Para  1   Term  1   Preterm      AB  1   Living  1     SAB  1   TAB      Ectopic      Multiple      Live Births  1           Family History  Problem Relation Age of Onset  . Diabetes Mother   . Thyroid disease Mother   . Heart attack Father   . Diabetes Maternal Aunt   . Diabetes Maternal Uncle   . COPD Maternal Grandmother   . Crohn's disease Maternal Grandmother   . Anesthesia problems Neg Hx   . Hypotension Neg Hx   . Malignant hyperthermia Neg Hx   . Pseudochol deficiency Neg Hx     Social History   Tobacco Use  . Smoking status: Former Smoker    Packs/day: 0.25    Years: 2.00    Pack years: 0.50    Quit date: 09/20/2008    Years since quitting: 11.8  . Smokeless tobacco: Never Used  Substance Use Topics  . Alcohol use: Yes    Comment: occasional  . Drug use: No    Home Medications Prior to Admission medications  Medication Sig Start Date End Date Taking? Authorizing Provider  ALPRAZolam (NIRAVAM) 0.5 MG dissolvable tablet Take 1 tablet (0.5 mg total) by mouth at bedtime as needed for anxiety. 06/06/20  Yes Janeece Agee, NP  amLODipine (NORVASC) 5 MG tablet Take 1 tablet (5 mg total) by mouth daily. 06/06/20  Yes Janeece Agee, NP  butalbital-acetaminophen-caffeine (FIORICET) [phone] MG tablet Take 1 tablet by mouth every 6 (six) hours as needed for headache. 04/21/20 04/21/21 Yes Janeece Agee, NP  cetirizine (ZYRTEC) 10 MG tablet Take 1 tablet (10 mg total) by mouth daily. 03/12/20  Yes Janeece Agee, NP  cyclobenzaprine (FLEXERIL) 5 MG tablet Take 1 tablet (5 mg total) by mouth 3 (three) times daily as needed for muscle spasms. 06/10/20  Yes Janeece Agee, NP  ondansetron (ZOFRAN) 4 MG tablet Take 1 tablet (4 mg total) by mouth every 8 (eight) hours as needed for nausea or vomiting. 06/24/20   Yes Janeece Agee, NP  busPIRone (BUSPAR) 5 MG tablet Take 1 tablet (5 mg total) by mouth 3 (three) times daily. As needed Patient not taking: Reported on 06/30/2020 09/11/19   Janeece Agee, NP  clindamycin (CLEOCIN) 300 MG capsule Take 1 capsule (300 mg total) by mouth 2 (two) times daily for 7 days. 06/30/20 07/07/20  Raeford Razor, MD  diclofenac Sodium (VOLTAREN) 1 % GEL Apply 4 g topically 4 (four) times daily. Patient not taking: Reported on 06/30/2020 05/06/20   Belinda Fisher, PA-C  doxycycline (VIBRAMYCIN) 100 MG capsule Take 1 capsule (100 mg total) by mouth 2 (two) times daily. 06/30/20   Raeford Razor, MD  famotidine (PEPCID) 20 MG tablet Take 1 tablet (20 mg total) by mouth daily. Patient not taking: Reported on 06/30/2020 06/25/20   Belinda Fisher, PA-C  fluconazole (DIFLUCAN) 150 MG tablet Take 1 tablet (150 mg total) by mouth daily. Take second dose 72 hours later if symptoms still persists. Patient not taking: Reported on 06/30/2020 06/25/20   Belinda Fisher, PA-C  levonorgestrel-ethinyl estradiol (ALESSE) 0.1-20 MG-MCG tablet Take 1 tablet by mouth daily. 06/13/20   Janeece Agee, NP  ondansetron (ZOFRAN) 4 MG tablet Take 1 tablet (4 mg total) by mouth every 6 (six) hours. 06/30/20   Raeford Razor, MD  oxyCODONE-acetaminophen (PERCOCET/ROXICET) 5-325 MG tablet Take 1 tablet by mouth every 6 (six) hours as needed for severe pain. 06/30/20   Raeford Razor, MD  sulfamethoxazole-trimethoprim (BACTRIM DS) 800-160 MG tablet Take 1 tablet by mouth 2 (two) times daily. Patient not taking: Reported on 06/30/2020 06/24/20   Janeece Agee, NP    Allergies    Other, Remeron [mirtazapine], Flagyl [metronidazole], Lexapro [escitalopram oxalate], and Tramadol  Review of Systems   Review of Systems All systems reviewed and negative, other than as noted in HPI.  Physical Exam Updated Vital Signs BP (!) 142/79   Pulse 65   Temp 98.6 F (37 C) (Oral)   Resp 16   SpO2 96%   Physical Exam Vitals and nursing note  reviewed.  Constitutional:      General: She is not in acute distress.    Appearance: She is well-developed.  HENT:     Head: Normocephalic and atraumatic.  Eyes:     General:        Right eye: No discharge.        Left eye: No discharge.     Conjunctiva/sclera: Conjunctivae normal.  Cardiovascular:     Rate and Rhythm: Normal rate and regular rhythm.     Heart sounds:  Normal heart sounds. No murmur heard.  No friction rub. No gallop.   Pulmonary:     Effort: Pulmonary effort is normal. No respiratory distress.     Breath sounds: Normal breath sounds.  Abdominal:     General: There is no distension.     Palpations: Abdomen is soft.     Tenderness: There is no abdominal tenderness.  Genitourinary:    Comments: Chaperone present.  Normal external female genitalia.  Significant whitish-gray vaginal discharge.  Positive CMT.  I do not appreciate an adnexal mass. Musculoskeletal:        General: No tenderness.     Cervical back: Neck supple.  Skin:    General: Skin is warm and dry.  Neurological:     Mental Status: She is alert.  Psychiatric:        Behavior: Behavior normal.        Thought Content: Thought content normal.     ED Results / Procedures / Treatments   Labs (all labs ordered are listed, but only abnormal results are displayed) Labs Reviewed  WET PREP, GENITAL - Abnormal; Notable for the following components:      Result Value   Trich, Wet Prep PRESENT (*)    Clue Cells Wet Prep HPF POC PRESENT (*)    WBC, Wet Prep HPF POC MANY (*)    All other components within normal limits  URINE CULTURE - Abnormal; Notable for the following components:   Culture MULTIPLE SPECIES PRESENT, SUGGEST RECOLLECTION (*)    All other components within normal limits  URINALYSIS, ROUTINE W REFLEX MICROSCOPIC - Abnormal; Notable for the following components:   APPearance CLOUDY (*)    Hgb urine dipstick MODERATE (*)    Protein, ur 30 (*)    Leukocytes,Ua LARGE (*)    RBC / HPF  >50 (*)    WBC, UA >50 (*)    Bacteria, UA FEW (*)    All other components within normal limits  COMPREHENSIVE METABOLIC PANEL - Abnormal; Notable for the following components:   Total Protein 8.4 (*)    All other components within normal limits  CBC - Abnormal; Notable for the following components:   WBC 13.8 (*)    Hemoglobin 15.6 (*)    HCT 46.8 (*)    All other components within normal limits  I-STAT BETA HCG BLOOD, ED ([facility], [facility], [facility] ONLY)  GC/CHLAMYDIA PROBE AMP ([place]) NOT AT [facility]    EKG None  Radiology No results found.  Procedures Procedures (including critical care time)  Medications Ordered in ED Medications  ketorolac (TORADOL) 15 MG/ML injection 15 mg (15 mg Intravenous Given 06/30/20 1825)  ondansetron (ZOFRAN) injection 4 mg (4 mg Intravenous Given 06/30/20 1823)  morphine 4 MG/ML injection 4 mg (4 mg Intravenous Given 06/30/20 1827)  lactated ringers bolus 1,000 mL (0 mLs Intravenous Stopped 06/30/20 2004)  HYDROmorphone (DILAUDID) injection 0.5 mg (0.5 mg Intravenous Given 06/30/20 1922)  azithromycin (ZITHROMAX) tablet 1,000 mg (1,000 mg Oral Given 06/30/20 2004)  cefTRIAXone (ROCEPHIN) 1 g in sodium chloride 0.9 % 100 mL IVPB (0 g Intravenous Stopped 06/30/20 2038)  ondansetron (ZOFRAN) injection 4 mg (4 mg Intravenous Given 06/30/20 2045)    ED Course  I have reviewed the triage vital signs and the nursing notes.  Pertinent labs & imaging results that were available during my care of the patient were reviewed by me and considered in my medical decision making (see chart for details).    MDM Rules/Calculators/A&P  34 year old female with pelvic pain.  Significant discharge and tenderness on exam.  Wet prep as above.  Allergy to metronidazole.  Clindamycin and doxycycline.  Outpatient follow-up.  Final Clinical Impression(s) / ED Diagnoses Final diagnoses:  PID (pelvic inflammatory disease)    Rx / DC Orders ED Discharge Orders          Ordered    doxycycline (VIBRAMYCIN) 100 MG capsule  2 times daily     Discontinue  Reprint     06/30/20 2037    oxyCODONE-acetaminophen (PERCOCET/ROXICET) 5-325 MG tablet  Every 6 hours PRN     Discontinue  Reprint     06/30/20 2037    clindamycin (CLEOCIN) 300 MG capsule  2 times daily     Discontinue  Reprint     06/30/20 2037    ondansetron (ZOFRAN) 4 MG tablet  Every 6 hours     Discontinue  Reprint     06/30/20 2042           Raeford Razor, MD 07/06/20 1145

## 2020-07-16 ENCOUNTER — Other Ambulatory Visit: Payer: Self-pay | Admitting: Registered Nurse

## 2020-07-16 DIAGNOSIS — Z8742 Personal history of other diseases of the female genital tract: Secondary | ICD-10-CM

## 2020-07-16 DIAGNOSIS — N3000 Acute cystitis without hematuria: Secondary | ICD-10-CM

## 2020-07-16 DIAGNOSIS — R1032 Left lower quadrant pain: Secondary | ICD-10-CM

## 2020-07-16 DIAGNOSIS — R110 Nausea: Secondary | ICD-10-CM

## 2020-08-05 ENCOUNTER — Telehealth: Payer: Self-pay | Admitting: Registered Nurse

## 2020-08-05 ENCOUNTER — Ambulatory Visit (INDEPENDENT_AMBULATORY_CARE_PROVIDER_SITE_OTHER): Payer: 59 | Admitting: Registered Nurse

## 2020-08-05 ENCOUNTER — Other Ambulatory Visit: Payer: Self-pay

## 2020-08-05 ENCOUNTER — Encounter: Payer: Self-pay | Admitting: Registered Nurse

## 2020-08-05 VITALS — BP 147/91 | HR 88 | Temp 97.9°F | Resp 18 | Ht 68.0 in | Wt 230.0 lb

## 2020-08-05 DIAGNOSIS — F411 Generalized anxiety disorder: Secondary | ICD-10-CM

## 2020-08-05 DIAGNOSIS — N911 Secondary amenorrhea: Secondary | ICD-10-CM

## 2020-08-05 DIAGNOSIS — Z23 Encounter for immunization: Secondary | ICD-10-CM | POA: Diagnosis not present

## 2020-08-05 MED ORDER — ALPRAZOLAM 0.5 MG PO TBDP: 0.5000 mg | ORAL_TABLET | Freq: Every evening | ORAL | 0 refills | Status: DC | PRN

## 2020-08-05 NOTE — Patient Instructions (Signed)
° ° ° °  If you have lab work done today you will be contacted with your lab results within the next 2 weeks.  If you have not heard from us then please contact us. The fastest way to get your results is to register for My Chart. ° ° °IF you received an x-ray today, you will receive an invoice from [facility]. Please contact [facility] at [phone] with questions or concerns regarding your invoice.  ° °IF you received labwork today, you will receive an invoice from [facility]. Please contact [facility] at [phone] with questions or concerns regarding your invoice.  ° °Our billing staff will not be able to assist you with questions regarding bills from these companies. ° °You will be contacted with the lab results as soon as they are available. The fastest way to get your results is to activate your My Chart account. Instructions are located on the last page of this paperwork. If you have not heard from us regarding the results in 2 weeks, please contact this office. °  ° ° ° °

## 2020-08-05 NOTE — Progress Notes (Signed)
Established Patient Office Visit  Subjective:  Patient ID: Krystal Humphrey, female    DOB: July 19, 1986  Age: 34 y.o. MRN: [id number]  CC:  Chief Complaint  Patient presents with  . Follow-up    patient states she came to get her get her birth controlled removed in july but still have not had an cycle.  . Medication Refill    xanax    HPI [name] presents for amenorrhea.  Had nexplanon removed 2-3 months ago. No menses since or before nexplanon removal. Has waited to start on COCs to line them up with menses - has not started them at this time. Has not been sexually active No AUB. Does note some occasional sharp pains medial to her hips. No midline pain. No known gyn history of abnormalities - no other symptoms of note. Does say that her stress has been substantial lately. BP mildly elevated on arrival. Pt came from work - having stressful day.  Past Medical History:  Diagnosis Date  . Anxiety   . Depression   . GERD (gastroesophageal reflux disease)   . Hiatal hernia   . History of Clostridium difficile colitis    04/ 2011  . History of duodenal ulcer    2009  . History of esophagitis   . History of panic attacks   . Nephrolithiasis    bilateral per ct 07-27-2016  L > R  (right side nonobstructive)  . Urgency of urination     Past Surgical History:  Procedure Laterality Date  . CYSTOSCOPY WITH RETROGRADE PYELOGRAM, URETEROSCOPY AND STENT PLACEMENT Left 07/30/2016   Procedure: CYSTOSCOPY WITH LEFT  RETROGRADE PYELOGRAM AND LEFT STENT PLACEMENT;  Surgeon: Malen Gauze, MD;  Location: [facility];  Service: Urology;  Laterality: Left;  . CYSTOSCOPY WITH RETROGRADE PYELOGRAM, URETEROSCOPY AND STENT PLACEMENT Left 08/17/2016   Procedure: CYSTOSCOPY WITH LEFT RETROGRADE  STENT EXCHANGE, URETEROSCOPY AND STONE EXTRACTION, LASER LITHOTRIPSY AND LEFT RETROGRADE PYELOGRAM;  Surgeon: Bjorn Pippin, MD;  Location: [facility];  Service: Urology;  Laterality: Left;  .  ESOPHAGOGASTRODUODENOSCOPY  last one 03-26-2009  . KNEE ARTHROSCOPY Right 03/19/2010  . TONSILLECTOMY  child    Family History  Problem Relation Age of Onset  . Diabetes Mother   . Thyroid disease Mother   . Heart attack Father   . Diabetes Maternal Aunt   . Diabetes Maternal Uncle   . COPD Maternal Grandmother   . Crohn's disease Maternal Grandmother   . Anesthesia problems Neg Hx   . Hypotension Neg Hx   . Malignant hyperthermia Neg Hx   . Pseudochol deficiency Neg Hx     Social History   Socioeconomic History  . Marital status: Single    Spouse name: Not on file  . Number of children: 1  . Years of education: 58  . Highest education level: Not on file  Occupational History  . Occupation: unemployed  Tobacco Use  . Smoking status: Former Smoker    Packs/day: 0.25    Years: 2.00    Pack years: 0.50    Quit date: 09/20/2008    Years since quitting: 11.8  . Smokeless tobacco: Never Used  Substance and Sexual Activity  . Alcohol use: Yes    Comment: occasional  . Drug use: No  . Sexual activity: Not on file    Comment: implant placed 09/ 2014  Other Topics Concern  . Not on file  Social History Narrative   Born and raised in [place], [place]. Currently  resides in a house with her boyfriend and daughter. 2 dogs. Fun: Softball, bowl   Denies religious beliefs that would effect health care.    Social Determinants of Health   Financial Resource Strain:   . Difficulty of Paying Living Expenses: Not on file  Food Insecurity:   . Worried About Programme researcher, broadcasting/film/video in the Last Year: Not on file  . Ran Out of Food in the Last Year: Not on file  Transportation Needs:   . Lack of Transportation (Medical): Not on file  . Lack of Transportation (Non-Medical): Not on file  Physical Activity:   . Days of Exercise per Week: Not on file  . Minutes of Exercise per Session: Not on file  Stress:   . Feeling of Stress : Not on file  Social Connections:   . Frequency of  Communication with Friends and Family: Not on file  . Frequency of Social Gatherings with Friends and Family: Not on file  . Attends Religious Services: Not on file  . Active Member of Clubs or Organizations: Not on file  . Attends Banker Meetings: Not on file  . Marital Status: Not on file  Intimate Partner Violence:   . Fear of Current or Ex-Partner: Not on file  . Emotionally Abused: Not on file  . Physically Abused: Not on file  . Sexually Abused: Not on file    Outpatient Medications Prior to Visit  Medication Sig Dispense Refill  . busPIRone (BUSPAR) 5 MG tablet Take 1 tablet (5 mg total) by mouth 3 (three) times daily. As needed 90 tablet 1  . butalbital-acetaminophen-caffeine (FIORICET) 50-325-40 MG tablet Take 1 tablet by mouth every 6 (six) hours as needed for headache. 20 tablet 0  . cetirizine (ZYRTEC) 10 MG tablet Take 1 tablet (10 mg total) by mouth daily. 30 tablet 11  . cyclobenzaprine (FLEXERIL) 5 MG tablet Take 1 tablet (5 mg total) by mouth 3 (three) times daily as needed for muscle spasms. 30 tablet 1  . doxycycline (VIBRAMYCIN) 100 MG capsule Take 1 capsule (100 mg total) by mouth 2 (two) times daily. 28 capsule 0  . levonorgestrel-ethinyl estradiol (ALESSE) 0.1-20 MG-MCG tablet Take 1 tablet by mouth daily. 84 tablet 4  . ondansetron (ZOFRAN) 4 MG tablet Take 1 tablet (4 mg total) by mouth every 8 (eight) hours as needed for nausea or vomiting. 20 tablet 0  . ondansetron (ZOFRAN) 4 MG tablet Take 1 tablet (4 mg total) by mouth every 6 (six) hours. 12 tablet 0  . oxyCODONE-acetaminophen (PERCOCET/ROXICET) 5-325 MG tablet Take 1 tablet by mouth every 6 (six) hours as needed for severe pain. 8 tablet 0  . ALPRAZolam (NIRAVAM) 0.5 MG dissolvable tablet Take 1 tablet (0.5 mg total) by mouth at bedtime as needed for anxiety. 30 tablet 0  . amLODipine (NORVASC) 5 MG tablet Take 1 tablet (5 mg total) by mouth daily. (Patient not taking: Reported on 08/05/2020) 90  tablet 3  . diclofenac Sodium (VOLTAREN) 1 % GEL Apply 4 g topically 4 (four) times daily. (Patient not taking: Reported on 06/30/2020) 350 g 0  . famotidine (PEPCID) 20 MG tablet Take 1 tablet (20 mg total) by mouth daily. (Patient not taking: Reported on 06/30/2020) 10 tablet 0  . fluconazole (DIFLUCAN) 150 MG tablet Take 1 tablet (150 mg total) by mouth daily. Take second dose 72 hours later if symptoms still persists. (Patient not taking: Reported on 06/30/2020) 2 tablet 0  . sulfamethoxazole-trimethoprim (BACTRIM DS) 800-160  MG tablet Take 1 tablet by mouth 2 (two) times daily. (Patient not taking: Reported on 06/30/2020) 6 tablet 0   No facility-administered medications prior to visit.    Allergies  Allergen Reactions  . Other Anaphylaxis and Itching  . Remeron [Mirtazapine] Anaphylaxis  . Flagyl [Metronidazole] Nausea And Vomiting  . Lexapro [Escitalopram Oxalate]     hallucinations  . Tramadol Itching    ROS Review of Systems Pertinent positives and negatives per hpi     Objective:    Physical Exam Vitals reviewed.  Constitutional:      General: She is not in acute distress.    Appearance: Normal appearance. She is obese. She is not ill-appearing, toxic-appearing or diaphoretic.  Cardiovascular:     Rate and Rhythm: Normal rate and regular rhythm.  Pulmonary:     Effort: Pulmonary effort is normal. No respiratory distress.  Genitourinary:    Comments: Deferred by patient Skin:    General: Skin is warm and dry.  Neurological:     General: No focal deficit present.     Mental Status: She is alert and oriented to person, place, and time. Mental status is at baseline.  Psychiatric:        Mood and Affect: Mood normal.        Behavior: Behavior normal.        Thought Content: Thought content normal.        Judgment: Judgment normal.     BP (!) 147/91   Pulse 88   Temp 97.9 F (36.6 C) (Temporal)   Resp 18   Ht 5\' 8"  (1.727 m)   Wt 230 lb (104.3 kg)   SpO2 96%    BMI 34.97 kg/m  Wt Readings from Last 3 Encounters:  08/05/20 230 lb (104.3 kg)  06/24/20 (!) 235 lb 3.2 oz (106.7 kg)  06/10/20 229 lb (103.9 kg)     There are no preventive care reminders to display for this patient.  There are no preventive care reminders to display for this patient.  Lab Results  Component Value Date   TSH 1.77 05/17/2018   Lab Results  Component Value Date   WBC 13.8 (H) 06/30/2020   HGB 15.6 (H) 06/30/2020   HCT 46.8 (H) 06/30/2020   MCV 93.8 06/30/2020   PLT 358 06/30/2020   Lab Results  Component Value Date   NA 140 06/30/2020   K 3.8 06/30/2020   CO2 25 06/30/2020   GLUCOSE 74 06/30/2020   BUN 11 06/30/2020   CREATININE 0.74 06/30/2020   BILITOT 0.9 06/30/2020   ALKPHOS 58 06/30/2020   AST 28 06/30/2020   ALT 37 06/30/2020   PROT 8.4 (H) 06/30/2020   ALBUMIN 4.5 06/30/2020   CALCIUM 9.2 06/30/2020   ANIONGAP 12 06/30/2020   GFR 74.53 05/17/2018   No results found for: CHOL No results found for: HDL No results found for: LDLCALC No results found for: TRIG No results found for: CHOLHDL No results found for: [id number]    Assessment & Plan:   Problem List Items Addressed This Visit      Other   Generalized anxiety disorder   Relevant Medications   ALPRAZolam (NIRAVAM) 0.5 MG dissolvable tablet    Other Visit Diagnoses    Secondary amenorrhea    -  Primary   Relevant Orders   hCG, serum, qualitative   Prolactin   FSH/LH   Estradiol   Testosterone      Meds ordered this encounter  Medications  .  ALPRAZolam (NIRAVAM) 0.5 MG dissolvable tablet    Sig: Take 1 tablet (0.5 mg total) by mouth at bedtime as needed for anxiety.    Dispense:  30 tablet    Refill:  0    Order Specific Question:   Supervising Provider    Answer:   Neva Seat, JEFFREY R [[id number]]    Follow-up: No follow-ups on file.   PLAN  Refill alprazolam  Flu shot given  Drawing labs to investigate for pregnancy or hormonal changes that may give a reason for  amenorrhea. Will follow up as warranted  Patient encouraged to call clinic with any questions, comments, or concerns.  Janeece Agee, NP

## 2020-08-05 NOTE — Addendum Note (Signed)
Addended by: Rudi Heap on: 08/05/2020 04:47 PM   Modules accepted: Orders

## 2020-08-05 NOTE — Telephone Encounter (Signed)
Error

## 2020-08-06 ENCOUNTER — Encounter: Payer: Self-pay | Admitting: Registered Nurse

## 2020-08-06 LAB — FSH/LH
FSH: 7.1 m[IU]/mL
LH: 14 m[IU]/mL

## 2020-08-06 LAB — PROLACTIN: Prolactin: 7.2 ng/mL (ref 4.8–23.3)

## 2020-08-06 LAB — TESTOSTERONE: Testosterone: 53 ng/dL (ref 8–60)

## 2020-08-06 LAB — HCG, SERUM, QUALITATIVE: hCG,Beta Subunit,Qual,Serum: NEGATIVE m[IU]/mL (ref <6)

## 2020-08-06 LAB — ESTRADIOL: Estradiol: 45.4 pg/mL

## 2020-08-25 ENCOUNTER — Encounter: Payer: Self-pay | Admitting: Registered Nurse

## 2020-08-25 NOTE — Progress Notes (Signed)
Established Patient Office Visit  Subjective:  Patient ID: Krystal Humphrey, female    DOB: 09/20/1986  Age: 34 y.o. MRN: [id number]  CC:  Chief Complaint  Patient presents with  . Birth control removal    Patient is here to get her birth control removed. Per patient she has no questions or concerns.  . Medication Refill    Flexeril.    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 Flexeril relieves this. Does not use daily. Does not combine with other sedatives  Nexplanon removal: unfortunately has been unhappy with experience lately - aub, cramping, other concerns. Would prefer to switch back to alternative method like COCs.   No other concerns today.   Past Medical History:  Diagnosis Date  . Anxiety   . Depression   . GERD (gastroesophageal reflux disease)   . Hiatal hernia   . History of Clostridium difficile colitis    04/ 2011  . History of duodenal ulcer    2009  . History of esophagitis   . History of panic attacks   . Nephrolithiasis    bilateral per ct 07-27-2016  L > R  (right side nonobstructive)  . Urgency of urination     Past Surgical History:  Procedure Laterality Date  . CYSTOSCOPY WITH RETROGRADE PYELOGRAM, URETEROSCOPY AND STENT PLACEMENT Left 07/30/2016   Procedure: CYSTOSCOPY WITH LEFT  RETROGRADE PYELOGRAM AND LEFT STENT PLACEMENT;  Surgeon: Malen Gauze, MD;  Location: [facility];  Service: Urology;  Laterality: Left;  . CYSTOSCOPY WITH RETROGRADE PYELOGRAM, URETEROSCOPY AND STENT PLACEMENT Left 08/17/2016   Procedure: CYSTOSCOPY WITH LEFT RETROGRADE  STENT EXCHANGE, URETEROSCOPY AND STONE EXTRACTION, LASER LITHOTRIPSY AND LEFT RETROGRADE PYELOGRAM;  Surgeon: Bjorn Pippin, MD;  Location: [facility];  Service: Urology;  Laterality: Left;  . ESOPHAGOGASTRODUODENOSCOPY  last one 03-26-2009  . KNEE  ARTHROSCOPY Right 03/19/2010  . TONSILLECTOMY  child    Family History  Problem Relation Age of Onset  . Diabetes Mother   . Thyroid disease Mother   . Heart attack Father   . Diabetes Maternal Aunt   . Diabetes Maternal Uncle   . COPD Maternal Grandmother   . Crohn's disease Maternal Grandmother   . Anesthesia problems Neg Hx   . Hypotension Neg Hx   . Malignant hyperthermia Neg Hx   . Pseudochol deficiency Neg Hx     Social History   Socioeconomic History  . Marital status: Single    Spouse name: Not on file  . Number of children: 1  . Years of education: 77  . Highest education level: Not on file  Occupational History  . Occupation: unemployed  Tobacco Use  . Smoking status: Former Smoker    Packs/day: 0.25    Years: 2.00    Pack years: 0.50    Quit date: 09/20/2008    Years since quitting: 11.9  . Smokeless tobacco: Never Used  Substance and Sexual Activity  . Alcohol use: Yes    Comment: occasional  . Drug use: No  . Sexual activity: Not on file    Comment: implant placed 09/ 2014  Other Topics Concern  . Not on file  Social History Narrative   Born and raised in [place], [place]. Currently resides in a house with her boyfriend and daughter. 2 dogs. Fun: Softball, bowl   Denies religious  beliefs that would effect health care.    Social Determinants of Health   Financial Resource Strain:   . Difficulty of Paying Living Expenses: Not on file  Food Insecurity:   . Worried About Programme researcher, broadcasting/film/video in the Last Year: Not on file  . Ran Out of Food in the Last Year: Not on file  Transportation Needs:   . Lack of Transportation (Medical): Not on file  . Lack of Transportation (Non-Medical): Not on file  Physical Activity:   . Days of Exercise per Week: Not on file  . Minutes of Exercise per Session: Not on file  Stress:   . Feeling of Stress : Not on file  Social Connections:   . Frequency of Communication with Friends and Family: Not on file  . Frequency  of Social Gatherings with Friends and Family: Not on file  . Attends Religious Services: Not on file  . Active Member of Clubs or Organizations: Not on file  . Attends Banker Meetings: Not on file  . Marital Status: Not on file  Intimate Partner Violence:   . Fear of Current or Ex-Partner: Not on file  . Emotionally Abused: Not on file  . Physically Abused: Not on file  . Sexually Abused: Not on file    Outpatient Medications Prior to Visit  Medication Sig Dispense Refill  . amLODipine (NORVASC) 5 MG tablet Take 1 tablet (5 mg total) by mouth daily. (Patient not taking: Reported on 08/05/2020) 90 tablet 3  . busPIRone (BUSPAR) 5 MG tablet Take 1 tablet (5 mg total) by mouth 3 (three) times daily. As needed 90 tablet 1  . butalbital-acetaminophen-caffeine (FIORICET) 50-325-40 MG tablet Take 1 tablet by mouth every 6 (six) hours as needed for headache. 20 tablet 0  . cetirizine (ZYRTEC) 10 MG tablet Take 1 tablet (10 mg total) by mouth daily. 30 tablet 11  . nitrofurantoin, macrocrystal-monohydrate, (MACROBID) 100 MG capsule Take 1 capsule (100 mg total) by mouth 2 (two) times daily for 5 days. 10 capsule 0  . ALPRAZolam (NIRAVAM) 0.5 MG dissolvable tablet Take 1 tablet (0.5 mg total) by mouth at bedtime as needed for anxiety. 30 tablet 0  . etonogestrel (NEXPLANON) 68 MG IMPL implant 1 each by Subdermal route once.    . diclofenac Sodium (VOLTAREN) 1 % GEL Apply 4 g topically 4 (four) times daily. (Patient not taking: Reported on 06/30/2020) 350 g 0   No facility-administered medications prior to visit.    Allergies  Allergen Reactions  . Other Anaphylaxis and Itching  . Remeron [Mirtazapine] Anaphylaxis  . Flagyl [Metronidazole] Nausea And Vomiting  . Lexapro [Escitalopram Oxalate]     hallucinations  . Tramadol Itching    ROS Review of Systems  Constitutional: Negative.   HENT: Negative.   Eyes: Negative.   Respiratory: Negative.   Cardiovascular: Negative.     Gastrointestinal: Negative.   Genitourinary: Negative.   Musculoskeletal: Negative.   Skin: Negative.   Neurological: Negative.   Psychiatric/Behavioral: Negative.       Objective:    Physical Exam Vitals and nursing note reviewed.  Constitutional:      General: She is not in acute distress.    Appearance: Normal appearance. She is normal weight. She is not ill-appearing, toxic-appearing or diaphoretic.  Cardiovascular:     Rate and Rhythm: Normal rate and regular rhythm.     Heart sounds: Normal heart sounds. No murmur heard.  No friction rub. No gallop.   Pulmonary:  Effort: Pulmonary effort is normal. No respiratory distress.     Breath sounds: Normal breath sounds. No stridor. No wheezing, rhonchi or rales.  Chest:     Chest wall: No tenderness.  Skin:    General: Skin is warm and dry.  Neurological:     General: No focal deficit present.     Mental Status: She is alert and oriented to person, place, and time. Mental status is at baseline.  Psychiatric:        Mood and Affect: Mood normal.        Behavior: Behavior normal.        Thought Content: Thought content normal.        Judgment: Judgment normal.     BP 131/82   Pulse 71   Temp 98 F (36.7 C) (Temporal)   Resp 18   Ht 5\' 8"  (1.727 m)   Wt 229 lb (103.9 kg)   SpO2 96%   BMI 34.82 kg/m  Wt Readings from Last 3 Encounters:  08/05/20 230 lb (104.3 kg)  06/24/20 (!) 235 lb 3.2 oz (106.7 kg)  06/10/20 229 lb (103.9 kg)     Health Maintenance Due  Topic Date Due  . COVID-19 Vaccine (1) Never done  . PAP SMEAR-Modifier  10/16/2019    There are no preventive care reminders to display for this patient.  Lab Results  Component Value Date   TSH 1.77 05/17/2018   Lab Results  Component Value Date   WBC 13.8 (H) 06/30/2020   HGB 15.6 (H) 06/30/2020   HCT 46.8 (H) 06/30/2020   MCV 93.8 06/30/2020   PLT 358 06/30/2020   Lab Results  Component Value Date   NA 140 06/30/2020   K 3.8  06/30/2020   CO2 25 06/30/2020   GLUCOSE 74 06/30/2020   BUN 11 06/30/2020   CREATININE 0.74 06/30/2020   BILITOT 0.9 06/30/2020   ALKPHOS 58 06/30/2020   AST 28 06/30/2020   ALT 37 06/30/2020   PROT 8.4 (H) 06/30/2020   ALBUMIN 4.5 06/30/2020   CALCIUM 9.2 06/30/2020   ANIONGAP 12 06/30/2020   GFR 74.53 05/17/2018   No results found for: CHOL No results found for: HDL No results found for: LDLCALC No results found for: TRIG No results found for: CHOLHDL No results found for: 05/19/2018    Assessment & Plan:   Problem List Items Addressed This Visit    None    Visit Diagnoses    Acute non intractable tension-type headache    -  Primary   Relevant Medications   cyclobenzaprine (FLEXERIL) 5 MG tablet      Meds ordered this encounter  Medications  . cyclobenzaprine (FLEXERIL) 5 MG tablet    Sig: Take 1 tablet (5 mg total) by mouth 3 (three) times daily as needed for muscle spasms.    Dispense:  30 tablet    Refill:  1    Order Specific Question:   Supervising Provider    [email or web address] Myles Lipps    Follow-up: No follow-ups on file.   PLAN  Refill flexeril  Procedure: Informed consent signed after thorough review with patient and opportunity to ask questions and have concerns addressed. Area numbed with 1cc of 2% lidocaine. Using #11 scalpel and forceps, was able to locate and remove nexplanon intact through incision >1cm. Steri streps and pressure bandage applied. Reviewed infection prevention methods with patient who voiced understanding  Patient encouraged to call clinic with any questions, comments,  or concerns.  Keierra Nudo, NP 

## 2020-09-15 ENCOUNTER — Ambulatory Visit (INDEPENDENT_AMBULATORY_CARE_PROVIDER_SITE_OTHER): Payer: 59

## 2020-09-15 ENCOUNTER — Ambulatory Visit (INDEPENDENT_AMBULATORY_CARE_PROVIDER_SITE_OTHER): Payer: 59 | Admitting: Registered Nurse

## 2020-09-15 ENCOUNTER — Encounter: Payer: Self-pay | Admitting: Registered Nurse

## 2020-09-15 ENCOUNTER — Other Ambulatory Visit: Payer: Self-pay

## 2020-09-15 VITALS — BP 160/89 | HR 81 | Temp 98.3°F | Resp 18 | Ht 68.0 in | Wt 233.0 lb

## 2020-09-15 DIAGNOSIS — M25562 Pain in left knee: Secondary | ICD-10-CM

## 2020-09-15 DIAGNOSIS — S8992XA Unspecified injury of left lower leg, initial encounter: Secondary | ICD-10-CM | POA: Diagnosis not present

## 2020-09-15 MED ORDER — HYDROXYZINE HCL 10 MG PO TABS: 10.0000 mg | ORAL_TABLET | Freq: Three times a day (TID) | ORAL | 0 refills | Status: DC | PRN

## 2020-09-15 MED ORDER — HYDROCODONE-ACETAMINOPHEN 5-325 MG PO TABS: 1.0000 | ORAL_TABLET | Freq: Four times a day (QID) | ORAL | 0 refills | Status: AC | PRN

## 2020-09-15 NOTE — Patient Instructions (Signed)
° ° ° °  If you have lab work done today you will be contacted with your lab results within the next 2 weeks.  If you have not heard from us then please contact us. The fastest way to get your results is to register for My Chart. ° ° °IF you received an x-ray today, you will receive an invoice from [facility]. Please contact [facility] at [phone] with questions or concerns regarding your invoice.  ° °IF you received labwork today, you will receive an invoice from [facility]. Please contact [facility] at [phone] with questions or concerns regarding your invoice.  ° °Our billing staff will not be able to assist you with questions regarding bills from these companies. ° °You will be contacted with the lab results as soon as they are available. The fastest way to get your results is to activate your My Chart account. Instructions are located on the last page of this paperwork. If you have not heard from us regarding the results in 2 weeks, please contact this office. °  ° ° ° °

## 2020-09-15 NOTE — Progress Notes (Signed)
Acute Office Visit  Subjective:    Patient ID: Krystal Humphrey, female    DOB: 1986/01/23, 34 y.o.   MRN: [id number]  Chief Complaint  Patient presents with  . Knee Pain    Patient states she was playing softball and dived for the ball and fell and rolled. per patient and its swollen and bruised. She is not able to bend the knee and its swollen and bruised.    HPI Patient is in today for knee injury  Was playing softball and dove for a ball. Landed oddly and had acute pain in L knee. Unsure of exact mechanics of fall.  Notes that there is bruising and extreme tenderness on lateral aspect of L knee. Limited ROM. No dependent swelling or redness.   Past Medical History:  Diagnosis Date  . Anxiety   . Depression   . GERD (gastroesophageal reflux disease)   . Hiatal hernia   . History of Clostridium difficile colitis    04/ 2011  . History of duodenal ulcer    2009  . History of esophagitis   . History of panic attacks   . Nephrolithiasis    bilateral per ct 07-27-2016  L > R  (right side nonobstructive)  . Urgency of urination     Past Surgical History:  Procedure Laterality Date  . CYSTOSCOPY WITH RETROGRADE PYELOGRAM, URETEROSCOPY AND STENT PLACEMENT Left 07/30/2016   Procedure: CYSTOSCOPY WITH LEFT  RETROGRADE PYELOGRAM AND LEFT STENT PLACEMENT;  Surgeon: Malen GauzePatrick L McKenzie, MD;  Location: [facility];  Service: Urology;  Laterality: Left;  . CYSTOSCOPY WITH RETROGRADE PYELOGRAM, URETEROSCOPY AND STENT PLACEMENT Left 08/17/2016   Procedure: CYSTOSCOPY WITH LEFT RETROGRADE  STENT EXCHANGE, URETEROSCOPY AND STONE EXTRACTION, LASER LITHOTRIPSY AND LEFT RETROGRADE PYELOGRAM;  Surgeon: Bjorn PippinJohn Wrenn, MD;  [facility];  Service: Urology;  Laterality: Left;  . ESOPHAGOGASTRODUODENOSCOPY  last one 03-26-2009  . KNEE ARTHROSCOPY Right 03/19/2010  . TONSILLECTOMY  child    Family History  Problem Relation Age of Onset  . Diabetes Mother   . Thyroid disease Mother    . Heart attack Father   . Diabetes Maternal Aunt   . Diabetes Maternal Uncle   . COPD Maternal Grandmother   . Crohn's disease Maternal Grandmother   . Anesthesia problems Neg Hx   . Hypotension Neg Hx   . Malignant hyperthermia Neg Hx   . Pseudochol deficiency Neg Hx     Social History   Socioeconomic History  . Marital status: Single    Spouse name: Not on file  . Number of children: 1  . Years of education: 7512  . Highest education level: Not on file  Occupational History  . Occupation: unemployed  Tobacco Use  . Smoking status: Former Smoker    Packs/day: 0.25    Years: 2.00    Pack years: 0.50    Quit date: 09/20/2008    Years since quitting: 11.9  . Smokeless tobacco: Never Used  Substance and Sexual Activity  . Alcohol use: Yes    Comment: occasional  . Drug use: No  . Sexual activity: Not on file    Comment: implant placed 09/ 2014  Other Topics Concern  . Not on file  Social History Narrative   [place], [place]. Currently resides in a house with her boyfriend and daughter. 2 dogs. Fun: Softball, bowl   Denies religious beliefs that would effect health care.    Social Determinants of Health  Financial Resource Strain:   . Difficulty of Paying Living Expenses: Not on file  Food Insecurity:   . Worried About Programme researcher, broadcasting/film/video in the Last Year: Not on file  . Ran Out of Food in the Last Year: Not on file  Transportation Needs:   . Lack of Transportation (Medical): Not on file  . Lack of Transportation (Non-Medical): Not on file  Physical Activity:   . Days of Exercise per Week: Not on file  . Minutes of Exercise per Session: Not on file  Stress:   . Feeling of Stress : Not on file  Social Connections:   . Frequency of Communication with Friends and Family: Not on file  . Frequency of Social Gatherings with Friends and Family: Not on file  . Attends Religious Services: Not on file  . Active Member of Clubs or Organizations: Not on  file  . Attends Banker Meetings: Not on file  . Marital Status: Not on file  Intimate Partner Violence:   . Fear of Current or Ex-Partner: Not on file  . Emotionally Abused: Not on file  . Physically Abused: Not on file  . Sexually Abused: Not on file    Outpatient Medications Prior to Visit  Medication Sig Dispense Refill  . ALPRAZolam (NIRAVAM) 0.5 MG dissolvable tablet Take 1 tablet (0.5 mg total) by mouth at bedtime as needed for anxiety. 30 tablet 0  . amLODipine (NORVASC) 5 MG tablet Take 1 tablet (5 mg total) by mouth daily. 90 tablet 3  . levonorgestrel-ethinyl estradiol (ALESSE) 0.1-20 MG-MCG tablet Take 1 tablet by mouth daily. 84 tablet 4  . busPIRone (BUSPAR) 5 MG tablet Take 1 tablet (5 mg total) by mouth 3 (three) times daily. As needed (Patient not taking: Reported on 09/15/2020) 90 tablet 1  . butalbital-acetaminophen-caffeine (FIORICET) 50-325-40 MG tablet Take 1 tablet by mouth every 6 (six) hours as needed for headache. (Patient not taking: Reported on 09/15/2020) 20 tablet 0  . cetirizine (ZYRTEC) 10 MG tablet Take 1 tablet (10 mg total) by mouth daily. (Patient not taking: Reported on 09/15/2020) 30 tablet 11  . cyclobenzaprine (FLEXERIL) 5 MG tablet Take 1 tablet (5 mg total) by mouth 3 (three) times daily as needed for muscle spasms. (Patient not taking: Reported on 09/15/2020) 30 tablet 1  . diclofenac Sodium (VOLTAREN) 1 % GEL Apply 4 g topically 4 (four) times daily. (Patient not taking: Reported on 06/30/2020) 350 g 0  . doxycycline (VIBRAMYCIN) 100 MG capsule Take 1 capsule (100 mg total) by mouth 2 (two) times daily. (Patient not taking: Reported on 09/15/2020) 28 capsule 0  . famotidine (PEPCID) 20 MG tablet Take 1 tablet (20 mg total) by mouth daily. (Patient not taking: Reported on 06/30/2020) 10 tablet 0  . fluconazole (DIFLUCAN) 150 MG tablet Take 1 tablet (150 mg total) by mouth daily. Take second dose 72 hours later if symptoms still persists.  (Patient not taking: Reported on 06/30/2020) 2 tablet 0  . ondansetron (ZOFRAN) 4 MG tablet Take 1 tablet (4 mg total) by mouth every 8 (eight) hours as needed for nausea or vomiting. (Patient not taking: Reported on 09/15/2020) 20 tablet 0  . ondansetron (ZOFRAN) 4 MG tablet Take 1 tablet (4 mg total) by mouth every 6 (six) hours. (Patient not taking: Reported on 09/15/2020) 12 tablet 0  . oxyCODONE-acetaminophen (PERCOCET/ROXICET) 5-325 MG tablet Take 1 tablet by mouth every 6 (six) hours as needed for severe pain. (Patient not taking: Reported on 09/15/2020)  8 tablet 0  . sulfamethoxazole-trimethoprim (BACTRIM DS) 800-160 MG tablet Take 1 tablet by mouth 2 (two) times daily. (Patient not taking: Reported on 06/30/2020) 6 tablet 0   No facility-administered medications prior to visit.    Allergies  Allergen Reactions  . Other Anaphylaxis and Itching  . Remeron [Mirtazapine] Anaphylaxis  . Flagyl [Metronidazole] Nausea And Vomiting  . Lexapro [Escitalopram Oxalate]     hallucinations  . Tramadol Itching    Review of Systems  Constitutional: Negative.   HENT: Negative.   Eyes: Negative.   Respiratory: Negative.   Cardiovascular: Negative.   Gastrointestinal: Negative.   Genitourinary: Negative.   Musculoskeletal: Negative.   Skin: Negative.   Neurological: Negative.   Psychiatric/Behavioral: Negative.        Objective:    Physical Exam Vitals and nursing note reviewed.  Constitutional:      Appearance: Normal appearance. She is not ill-appearing or diaphoretic.  Cardiovascular:     Rate and Rhythm: Normal rate and regular rhythm.  Musculoskeletal:        General: Swelling, tenderness and signs of injury present. No deformity. Normal range of motion.     Right lower leg: No edema.     Left lower leg: No edema.  Skin:    General: Skin is warm and dry.     Findings: Bruising present.  Neurological:     General: No focal deficit present.     Mental Status: She is alert and  oriented to person, place, and time. Mental status is at baseline.  Psychiatric:        Mood and Affect: Mood normal.        Behavior: Behavior normal.        Thought Content: Thought content normal.        Judgment: Judgment normal.     BP (!) 160/89   Pulse 81   Temp 98.3 F (36.8 C) (Temporal)   Resp 18   Ht 5\' 8"  (1.727 m)   Wt 233 lb (105.7 kg)   SpO2 99%   BMI 35.43 kg/m  Wt Readings from Last 3 Encounters:  09/15/20 233 lb (105.7 kg)  08/05/20 230 lb (104.3 kg)  06/24/20 (!) 235 lb 3.2 oz (106.7 kg)    There are no preventive care reminders to display for this patient.  There are no preventive care reminders to display for this patient.   Lab Results  Component Value Date   TSH 1.77 05/17/2018   Lab Results  Component Value Date   WBC 13.8 (H) 06/30/2020   HGB 15.6 (H) 06/30/2020   HCT 46.8 (H) 06/30/2020   MCV 93.8 06/30/2020   PLT 358 06/30/2020   Lab Results  Component Value Date   NA 140 06/30/2020   K 3.8 06/30/2020   CO2 25 06/30/2020   GLUCOSE 74 06/30/2020   BUN 11 06/30/2020   CREATININE 0.74 06/30/2020   BILITOT 0.9 06/30/2020   ALKPHOS 58 06/30/2020   AST 28 06/30/2020   ALT 37 06/30/2020   PROT 8.4 (H) 06/30/2020   ALBUMIN 4.5 06/30/2020   CALCIUM 9.2 06/30/2020   ANIONGAP 12 06/30/2020   GFR 74.53 05/17/2018   No results found for: CHOL No results found for: HDL No results found for: LDLCALC No results found for: TRIG No results found for: CHOLHDL No results found for: 05/19/2018     Assessment & Plan:   Problem List Items Addressed This Visit    None    Visit Diagnoses  Injury of left knee, initial encounter    -  Primary   Relevant Medications   HYDROcodone-acetaminophen (NORCO) 5-325 MG tablet   hydrOXYzine (ATARAX/VISTARIL) 10 MG tablet   Other Relevant Orders   DG Knee Complete 4 Views Left (Completed)   Ambulatory referral to Orthopedic Surgery       Meds ordered this encounter  Medications  .  HYDROcodone-acetaminophen (NORCO) 5-325 MG tablet    Sig: Take 1 tablet by mouth every 6 (six) hours as needed for up to 5 days for moderate pain.    Dispense:  20 tablet    Refill:  0    Order Specific Question:   Supervising Provider    Answer:   Neva Seat, JEFFREY R [[id number]]  . hydrOXYzine (ATARAX/VISTARIL) 10 MG tablet    Sig: Take 1 tablet (10 mg total) by mouth 3 (three) times daily as needed.    Dispense:  30 tablet    Refill:  0    Order Specific Question:   Supervising Provider    Answer:   Neva Seat, JEFFREY R [[id number]]    PLAN  With extreme tenderness and trouble weightbearing, ordered xray, no acute abnormalities noticed.   Will give Norco and hydroxyzine for hx of itching with tramadol - discussed symptoms of anaphylaxis and allergic reaction with patient who demonstrated understanding and will proceed to ED with any concerns.  Will refer to ortho. Concern for soft tissue injury.  Patient encouraged to call clinic with any questions, comments, or concerns.  Janeece Agee, NP

## 2020-09-16 ENCOUNTER — Encounter: Payer: Self-pay | Admitting: Orthopaedic Surgery

## 2020-09-16 ENCOUNTER — Ambulatory Visit (INDEPENDENT_AMBULATORY_CARE_PROVIDER_SITE_OTHER): Payer: 59 | Admitting: Orthopaedic Surgery

## 2020-09-16 VITALS — Ht 68.0 in | Wt 230.0 lb

## 2020-09-16 DIAGNOSIS — S8002XA Contusion of left knee, initial encounter: Secondary | ICD-10-CM | POA: Diagnosis not present

## 2020-09-16 DIAGNOSIS — M25562 Pain in left knee: Secondary | ICD-10-CM | POA: Diagnosis not present

## 2020-09-16 DIAGNOSIS — M25462 Effusion, left knee: Secondary | ICD-10-CM | POA: Insufficient documentation

## 2020-09-16 MED ORDER — HYDROCODONE-ACETAMINOPHEN 5-325 MG PO TABS: 1.0000 | ORAL_TABLET | Freq: Four times a day (QID) | ORAL | 0 refills | Status: DC | PRN

## 2020-09-16 NOTE — Progress Notes (Addendum)
Office Visit Note   Patient: Krystal Humphrey           Date of Birth: [birth year]           MRN: [id number] Visit Date: 09/16/2020              Requested by: Janeece Agee, NP [address] [place],  [place] [address] PCP: Janeece Agee, NP   Assessment & Plan: Visit Diagnoses:  1. Acute pain of left knee   2. Traumatic ecchymosis of left knee, initial encounter     Plan:  #1: MRI scan of the left knee to rule out internal derangement #2: Long knee immobilizer to the left knee #3: Continue nonweightbearing with crutches on the left knee. #4: Prescription for Norco was placed for pain management.  She states she has tolerated this pain medicine. #5: Return after MRI scan #6: told not to use Fioricet with Norco    Follow-Up Instructions: Return for follow up after MRI.   Orders:  Orders Placed This Encounter  Procedures  . MR Knee Left w/o contrast   Meds ordered this encounter  Medications  . HYDROcodone-acetaminophen (NORCO/VICODIN) 5-325 MG tablet    Sig: Take 1 tablet by mouth every 6 (six) hours as needed for moderate pain.    Dispense:  30 tablet    Refill:  0    Order Specific Question:   Supervising Provider    Answer:   Valeria Batman [[id number]]      Procedures: No procedures performed   Clinical Data: No additional findings.   Subjective: Chief Complaint  Patient presents with  . Left Knee - Pain    DOI 09/12/2020   HPI Patient presents today for left knee pain. She states that she was playing softball on 09/12/2020. She was running and dove for the ball, which then resulted in her body flipping and injuring her left knee. She said that it was painful immediately and swollen. Her pain is located laterally. She has bruising and swelling. She states that it feels weak. She cannot fully extend or flex her knee. She saw her PCP yesterday and had x-rays taken. She was given hydrocodone to take for pain. She is using crutches for ambulation.    Review of  Systems  Constitutional: Negative for fatigue.  HENT: Negative for ear pain.   Eyes: Negative for pain.  Respiratory: Negative for shortness of breath.   Cardiovascular: Negative for leg swelling.  Gastrointestinal: Negative for constipation and diarrhea.  Endocrine: Negative for cold intolerance and heat intolerance.  Genitourinary: Negative for difficulty urinating.  Musculoskeletal: Positive for joint swelling.  Skin: Negative for rash.  Allergic/Immunologic: Negative for food allergies.  Neurological: Negative for weakness.  Hematological: Does not bruise/bleed easily.  Psychiatric/Behavioral: Positive for sleep disturbance.     Objective: Vital Signs: Ht 5\' 8"  (1.727 m)   Wt 230 lb (104.3 kg)   BMI 34.97 kg/m   Physical Exam Constitutional:      Appearance: Normal appearance. She is obese.  HENT:     Head: Normocephalic.  Cardiovascular:     Rate and Rhythm: Normal rate.  Musculoskeletal:        General: Swelling and tenderness present.  Skin:    General: Skin is warm and dry.  Neurological:     General: No focal deficit present.     Mental Status: She is alert.  Psychiatric:        Mood and Affect: Mood normal.  Behavior: Behavior normal.        Thought Content: Thought content normal.        Judgment: Judgment normal.     Ortho Exam  Exam is quite limited today secondary to pain.  She does have a mild effusion.  Ecchymosis along the lateral aspect of the knee and into the proximal left fibular area.  She may open a little bit with varus and valgus stressing.  This is quite limited secondary to her pain.  Cannot get her up completely straight at this time either.  She resists any type of motion to the knee.  No warmth or erythema.  Appears he may have a positive Lachman's.  Unable to perform a drawer.  Specialty Comments:  No specialty comments available.  Imaging: EXAM: LEFT KNEE - COMPLETE 4+ VIEW  COMPARISON:  None.  FINDINGS: No evidence of  fracture, dislocation, or joint effusion. No evidence of arthropathy or other focal bone abnormality. Soft tissues are unremarkable.  IMPRESSION: Normal exam.   Electronically Signed   By: Drusilla Kanner M.D.   On: 09/15/2020 11:58    PMFS History: Current Outpatient Medications  Medication Sig Dispense Refill  . ALPRAZolam (NIRAVAM) 0.5 MG dissolvable tablet Take 1 tablet (0.5 mg total) by mouth at bedtime as needed for anxiety. 30 tablet 0  . amLODipine (NORVASC) 5 MG tablet Take 1 tablet (5 mg total) by mouth daily. 90 tablet 3  . busPIRone (BUSPAR) 5 MG tablet Take 1 tablet (5 mg total) by mouth 3 (three) times daily. As needed 90 tablet 1  . butalbital-acetaminophen-caffeine (FIORICET) 50-325-40 MG tablet Take 1 tablet by mouth every 6 (six) hours as needed for headache. 20 tablet 0  . cetirizine (ZYRTEC) 10 MG tablet Take 1 tablet (10 mg total) by mouth daily. 30 tablet 11  . cyclobenzaprine (FLEXERIL) 5 MG tablet Take 1 tablet (5 mg total) by mouth 3 (three) times daily as needed for muscle spasms. 30 tablet 1  . diclofenac Sodium (VOLTAREN) 1 % GEL Apply 4 g topically 4 (four) times daily. 350 g 0  . doxycycline (VIBRAMYCIN) 100 MG capsule Take 1 capsule (100 mg total) by mouth 2 (two) times daily. 28 capsule 0  . famotidine (PEPCID) 20 MG tablet Take 1 tablet (20 mg total) by mouth daily. 10 tablet 0  . fluconazole (DIFLUCAN) 150 MG tablet Take 1 tablet (150 mg total) by mouth daily. Take second dose 72 hours later if symptoms still persists. 2 tablet 0  . HYDROcodone-acetaminophen (NORCO) 5-325 MG tablet Take 1 tablet by mouth every 6 (six) hours as needed for up to 5 days for moderate pain. 20 tablet 0  . hydrOXYzine (ATARAX/VISTARIL) 10 MG tablet Take 1 tablet (10 mg total) by mouth 3 (three) times daily as needed. 30 tablet 0  . levonorgestrel-ethinyl estradiol (ALESSE) 0.1-20 MG-MCG tablet Take 1 tablet by mouth daily. 84 tablet 4  . ondansetron (ZOFRAN) 4 MG tablet  Take 1 tablet (4 mg total) by mouth every 8 (eight) hours as needed for nausea or vomiting. 20 tablet 0  . ondansetron (ZOFRAN) 4 MG tablet Take 1 tablet (4 mg total) by mouth every 6 (six) hours. 12 tablet 0  . oxyCODONE-acetaminophen (PERCOCET/ROXICET) 5-325 MG tablet Take 1 tablet by mouth every 6 (six) hours as needed for severe pain. 8 tablet 0  . sulfamethoxazole-trimethoprim (BACTRIM DS) 800-160 MG tablet Take 1 tablet by mouth 2 (two) times daily. 6 tablet 0  . HYDROcodone-acetaminophen (NORCO/VICODIN) 5-325 MG tablet  Take 1 tablet by mouth every 6 (six) hours as needed for moderate pain. 30 tablet 0   No current facility-administered medications for this visit.    Patient Active Problem List   Diagnosis Date Noted  . Pain and swelling of left knee 09/16/2020  . Traumatic ecchymosis of left knee 09/16/2020  . Generalized anxiety disorder 06/20/2019  . Obesity 01/07/2015   Past Medical History:  Diagnosis Date  . Anxiety   . Depression   . GERD (gastroesophageal reflux disease)   . Hiatal hernia   . History of Clostridium difficile colitis    04/ 2011  . History of duodenal ulcer    2009  . History of esophagitis   . History of panic attacks   . Nephrolithiasis    bilateral per ct 07-27-2016  L > R  (right side nonobstructive)  . Urgency of urination     Family History  Problem Relation Age of Onset  . Diabetes Mother   . Thyroid disease Mother   . Heart attack Father   . Diabetes Maternal Aunt   . Diabetes Maternal Uncle   . COPD Maternal Grandmother   . Crohn's disease Maternal Grandmother   . Anesthesia problems Neg Hx   . Hypotension Neg Hx   . Malignant hyperthermia Neg Hx   . Pseudochol deficiency Neg Hx     Past Surgical History:  Procedure Laterality Date  . CYSTOSCOPY WITH RETROGRADE PYELOGRAM, URETEROSCOPY AND STENT PLACEMENT Left 07/30/2016   Procedure: CYSTOSCOPY WITH LEFT  RETROGRADE PYELOGRAM AND LEFT STENT PLACEMENT;  Surgeon: Malen Gauze,  MD;  Location: [facility];  Service: Urology;  Laterality: Left;  . CYSTOSCOPY WITH RETROGRADE PYELOGRAM, URETEROSCOPY AND STENT PLACEMENT Left 08/17/2016   Procedure: CYSTOSCOPY WITH LEFT RETROGRADE  STENT EXCHANGE, URETEROSCOPY AND STONE EXTRACTION, LASER LITHOTRIPSY AND LEFT RETROGRADE PYELOGRAM;  Surgeon: Bjorn Pippin, MD;  Location: [facility];  Service: Urology;  Laterality: Left;  . ESOPHAGOGASTRODUODENOSCOPY  last one 03-26-2009  . KNEE ARTHROSCOPY Right 03/19/2010  . TONSILLECTOMY  child   Social History   Occupational History  . Occupation: unemployed  Tobacco Use  . Smoking status: Former Smoker    Packs/day: 0.25    Years: 2.00    Pack years: 0.50    Quit date: 09/20/2008    Years since quitting: 11.9  . Smokeless tobacco: Never Used  Substance and Sexual Activity  . Alcohol use: Yes    Comment: occasional  . Drug use: No  . Sexual activity: Not on file    Comment: implant placed 09/ 2014

## 2020-09-18 ENCOUNTER — Encounter: Payer: BLUE CROSS/BLUE SHIELD | Admitting: Obstetrics and Gynecology

## 2020-09-18 ENCOUNTER — Other Ambulatory Visit: Payer: 59

## 2020-09-21 ENCOUNTER — Other Ambulatory Visit: Payer: Self-pay

## 2020-09-21 ENCOUNTER — Ambulatory Visit
Admission: RE | Admit: 2020-09-21 | Discharge: 2020-09-21 | Disposition: A | Discharge status: Home / Self Care | Payer: 59 | Source: Ambulatory Visit | Attending: Orthopaedic Surgery | Admitting: Orthopaedic Surgery

## 2020-09-21 DIAGNOSIS — M25562 Pain in left knee: Secondary | ICD-10-CM

## 2020-09-23 ENCOUNTER — Ambulatory Visit (INDEPENDENT_AMBULATORY_CARE_PROVIDER_SITE_OTHER): Payer: 59 | Admitting: Orthopaedic Surgery

## 2020-09-23 ENCOUNTER — Encounter: Payer: Self-pay | Admitting: Orthopaedic Surgery

## 2020-09-23 ENCOUNTER — Other Ambulatory Visit: Payer: Self-pay

## 2020-09-23 VITALS — Ht 68.0 in | Wt 230.0 lb

## 2020-09-23 DIAGNOSIS — M25462 Effusion, left knee: Secondary | ICD-10-CM | POA: Diagnosis not present

## 2020-09-23 DIAGNOSIS — M25562 Pain in left knee: Secondary | ICD-10-CM

## 2020-09-23 DIAGNOSIS — S8002XD Contusion of left knee, subsequent encounter: Secondary | ICD-10-CM | POA: Insufficient documentation

## 2020-09-23 NOTE — Progress Notes (Signed)
Office Visit Note   Patient: Krystal Humphrey           Date of Birth: Dec 07, 1985           MRN: [id number] Visit Date: 09/23/2020              Requested by: Janeece Agee, NP [address] [place],  [place] [address] PCP: Janeece Agee, NP   Assessment & Plan: Visit Diagnoses:  1. Pain and swelling of left knee   2. Traumatic hematoma of knee, left, subsequent encounter   3. Contusion of left knee, subsequent encounter     Plan: #1: She may discontinue the use of the knee immobilizer at this time. #2: No sporting activities at this time until her swelling is completely gone and so is her pain. #3: Return as needed.  Follow-Up Instructions: Return if symptoms worsen or fail to improve.   Orders:  No orders of the defined types were placed in this encounter.  No orders of the defined types were placed in this encounter.     Procedures: No procedures performed   Clinical Data: No additional findings.   Subjective: Chief Complaint  Patient presents with  . Left Knee - Follow-up    MRI review   HPI Patient presents today for follow up on her left knee. She had an MRI performed on 09/21/2020 and is here today for those results.    Review of Systems  Constitutional: Negative for fatigue.  HENT: Negative for ear pain.   Eyes: Negative for pain.  Respiratory: Negative for shortness of breath.   Cardiovascular: Negative for leg swelling.  Gastrointestinal: Negative for constipation and diarrhea.  Endocrine: Negative for cold intolerance and heat intolerance.  Genitourinary: Negative for difficulty urinating.  Musculoskeletal: Positive for joint swelling.  Skin: Negative for rash.  Allergic/Immunologic: Negative for food allergies.  Neurological: Negative for weakness.  Hematological: Does not bruise/bleed easily.  Psychiatric/Behavioral: Positive for sleep disturbance.     Objective: Vital Signs: Ht 5\' 8"  (1.727 m)   Wt 230 lb (104.3 kg)   BMI 34.97 kg/m     Physical Exam Constitutional:      Appearance: Normal appearance. She is obese.  HENT:     Head: Normocephalic.  Cardiovascular:     Rate and Rhythm: Normal rate.  Musculoskeletal:        General: Swelling and tenderness present.  Skin:    General: Skin is warm and dry.  Neurological:     General: No focal deficit present.     Mental Status: She is alert.  Psychiatric:        Mood and Affect: Mood normal.        Behavior: Behavior normal.        Thought Content: Thought content normal.        Judgment: Judgment normal.     Ortho Exam  Exam today reveals less ecchymosis.  She does have subcutaneous swelling along the lateral aspect of the knee and proximal tibia.  She can range of motion full extension to 90 degrees easily.  Ligamentously stable.  Neurovascular intact distally.  Specialty Comments:  No specialty comments available.  Imaging:  MR Knee Left w/o contrast  Result Date: 09/21/2020 CLINICAL DATA:  Anterior and lateral knee pain for 9 days after fall EXAM: MRI OF THE LEFT KNEE WITHOUT CONTRAST TECHNIQUE: Multiplanar, multisequence MR imaging of the knee was performed. No intravenous contrast was administered. COMPARISON:  X-ray 09/15/2020 FINDINGS: MENISCI Medial meniscus:  Intact. Lateral  meniscus:  Intact. LIGAMENTS Cruciates:  Intact ACL and PCL. Collaterals: Medial collateral ligament is intact. Lateral collateral ligament complex is intact. CARTILAGE Patellofemoral:  No chondral defect. Medial:  No chondral defect. Lateral: Chondral surface irregularity of the lateral tibial plateau without full-thickness defect. Lateral femoral condyle cartilage surfaces are preserved. Joint:  No joint effusion.  Unremarkable fat pads. Popliteal Fossa:  No Baker cyst. Intact popliteus tendon. Extensor Mechanism:  Intact quadriceps tendon and patellar tendon. Bones: No focal marrow signal abnormality. No fracture or dislocation. Other: Subcutaneous edema at the anterolateral aspect  of the knee with elongated simple fluid signal intensity collection measuring 5.7 x 1.1 x 6.3 cm. IMPRESSION: 1. No evidence of internal derangement of the left knee. 2. Subcutaneous edema at the anterolateral aspect of the knee with elongated simple fluid signal intensity collection measuring 5.7 x 1.1 x 6.3 cm. Findings may reflect a resolving hematoma or possibly a fascial degloving type collection. 3. Chondral surface irregularity of the lateral tibial plateau without full-thickness defect. Electronically Signed   By: Duanne Guess D.O.   On: 09/21/2020 09:07    PMFS History: Current Outpatient Medications  Medication Sig Dispense Refill  . ALPRAZolam (NIRAVAM) 0.5 MG dissolvable tablet Take 1 tablet (0.5 mg total) by mouth at bedtime as needed for anxiety. 30 tablet 0  . amLODipine (NORVASC) 5 MG tablet Take 1 tablet (5 mg total) by mouth daily. 90 tablet 3  . busPIRone (BUSPAR) 5 MG tablet Take 1 tablet (5 mg total) by mouth 3 (three) times daily. As needed 90 tablet 1  . butalbital-acetaminophen-caffeine (FIORICET) 50-325-40 MG tablet Take 1 tablet by mouth every 6 (six) hours as needed for headache. 20 tablet 0  . cetirizine (ZYRTEC) 10 MG tablet Take 1 tablet (10 mg total) by mouth daily. 30 tablet 11  . cyclobenzaprine (FLEXERIL) 5 MG tablet Take 1 tablet (5 mg total) by mouth 3 (three) times daily as needed for muscle spasms. 30 tablet 1  . diclofenac Sodium (VOLTAREN) 1 % GEL Apply 4 g topically 4 (four) times daily. 350 g 0  . doxycycline (VIBRAMYCIN) 100 MG capsule Take 1 capsule (100 mg total) by mouth 2 (two) times daily. 28 capsule 0  . famotidine (PEPCID) 20 MG tablet Take 1 tablet (20 mg total) by mouth daily. 10 tablet 0  . fluconazole (DIFLUCAN) 150 MG tablet Take 1 tablet (150 mg total) by mouth daily. Take second dose 72 hours later if symptoms still persists. 2 tablet 0  . HYDROcodone-acetaminophen (NORCO/VICODIN) 5-325 MG tablet Take 1 tablet by mouth every 6 (six) hours  as needed for moderate pain. 30 tablet 0  . hydrOXYzine (ATARAX/VISTARIL) 10 MG tablet Take 1 tablet (10 mg total) by mouth 3 (three) times daily as needed. 30 tablet 0  . levonorgestrel-ethinyl estradiol (ALESSE) 0.1-20 MG-MCG tablet Take 1 tablet by mouth daily. 84 tablet 4  . ondansetron (ZOFRAN) 4 MG tablet Take 1 tablet (4 mg total) by mouth every 8 (eight) hours as needed for nausea or vomiting. 20 tablet 0  . ondansetron (ZOFRAN) 4 MG tablet Take 1 tablet (4 mg total) by mouth every 6 (six) hours. 12 tablet 0  . oxyCODONE-acetaminophen (PERCOCET/ROXICET) 5-325 MG tablet Take 1 tablet by mouth every 6 (six) hours as needed for severe pain. 8 tablet 0  . sulfamethoxazole-trimethoprim (BACTRIM DS) 800-160 MG tablet Take 1 tablet by mouth 2 (two) times daily. 6 tablet 0   No current facility-administered medications for this visit.    Patient  Active Problem List   Diagnosis Date Noted  . Traumatic hematoma of knee, left, subsequent encounter 09/23/2020  . Pain and swelling of left knee 09/16/2020  . Contusion of left knee 09/16/2020  . Generalized anxiety disorder 06/20/2019  . Obesity 01/07/2015   Past Medical History:  Diagnosis Date  . Anxiety   . Depression   . GERD (gastroesophageal reflux disease)   . Hiatal hernia   . History of Clostridium difficile colitis    04/ 2011  . History of duodenal ulcer    2009  . History of esophagitis   . History of panic attacks   . Nephrolithiasis    bilateral per ct 07-27-2016  L > R  (right side nonobstructive)  . Urgency of urination     Family History  Problem Relation Age of Onset  . Diabetes Mother   . Thyroid disease Mother   . Heart attack Father   . Diabetes Maternal Aunt   . Diabetes Maternal Uncle   . COPD Maternal Grandmother   . Crohn's disease Maternal Grandmother   . Anesthesia problems Neg Hx   . Hypotension Neg Hx   . Malignant hyperthermia Neg Hx   . Pseudochol deficiency Neg Hx     Past Surgical History:    Procedure Laterality Date  . CYSTOSCOPY WITH RETROGRADE PYELOGRAM, URETEROSCOPY AND STENT PLACEMENT Left 07/30/2016   Procedure: CYSTOSCOPY WITH LEFT  RETROGRADE PYELOGRAM AND LEFT STENT PLACEMENT;  Surgeon: Malen Gauze, MD;  Location: [facility];  Service: Urology;  Laterality: Left;  . CYSTOSCOPY WITH RETROGRADE PYELOGRAM, URETEROSCOPY AND STENT PLACEMENT Left 08/17/2016   Procedure: CYSTOSCOPY WITH LEFT RETROGRADE  STENT EXCHANGE, URETEROSCOPY AND STONE EXTRACTION, LASER LITHOTRIPSY AND LEFT RETROGRADE PYELOGRAM;  Surgeon: Bjorn Pippin, MD;  Location: [facility];  Service: Urology;  Laterality: Left;  . ESOPHAGOGASTRODUODENOSCOPY  last one 03-26-2009  . KNEE ARTHROSCOPY Right 03/19/2010  . TONSILLECTOMY  child   Social History   Occupational History  . Occupation: unemployed  Tobacco Use  . Smoking status: Former Smoker    Packs/day: 0.25    Years: 2.00    Pack years: 0.50    Quit date: 09/20/2008    Years since quitting: 12.0  . Smokeless tobacco: Never Used  Substance and Sexual Activity  . Alcohol use: Yes    Comment: occasional  . Drug use: No  . Sexual activity: Not on file    Comment: implant placed 09/ 2014

## 2020-09-25 ENCOUNTER — Telehealth: Payer: Self-pay

## 2020-09-25 NOTE — Telephone Encounter (Signed)
Pt. Called requesting refill on Xanax from NP [facility]

## 2020-09-26 ENCOUNTER — Other Ambulatory Visit: Payer: Self-pay | Admitting: Registered Nurse

## 2020-09-26 DIAGNOSIS — F411 Generalized anxiety disorder: Secondary | ICD-10-CM

## 2020-09-26 MED ORDER — ALPRAZOLAM 0.5 MG PO TBDP: 0.5000 mg | ORAL_TABLET | Freq: Every evening | ORAL | 0 refills | Status: DC | PRN

## 2020-09-26 NOTE — Telephone Encounter (Signed)
Sent Thanks Rich

## 2020-12-11 ENCOUNTER — Ambulatory Visit
Admission: EM | Admit: 2020-12-11 | Discharge: 2020-12-11 | Disposition: A | Discharge status: Home / Self Care | Payer: Medicaid Other | Attending: Emergency Medicine | Admitting: Emergency Medicine

## 2020-12-11 ENCOUNTER — Other Ambulatory Visit: Payer: Self-pay

## 2020-12-11 DIAGNOSIS — Z20822 Contact with and (suspected) exposure to covid-19: Secondary | ICD-10-CM | POA: Diagnosis not present

## 2020-12-11 DIAGNOSIS — Z1152 Encounter for screening for COVID-19: Secondary | ICD-10-CM | POA: Diagnosis not present

## 2020-12-11 MED ORDER — BENZONATATE 200 MG PO CAPS
200.0000 mg | ORAL_CAPSULE | Freq: Three times a day (TID) | ORAL | 0 refills | Status: AC | PRN
Start: 2020-12-11 — End: 2020-12-18

## 2020-12-11 MED ORDER — IBUPROFEN 800 MG PO TABS
800.0000 mg | ORAL_TABLET | Freq: Three times a day (TID) | ORAL | 0 refills | Status: DC
Start: 2020-12-11 — End: 2021-01-10

## 2020-12-11 MED ORDER — ONDANSETRON 4 MG PO TBDP: 4.0000 mg | ORAL_TABLET | Freq: Three times a day (TID) | ORAL | 0 refills | Status: DC | PRN

## 2020-12-11 NOTE — ED Triage Notes (Signed)
Patient states she started having a headache yesterday. Pt woke this morning with a headache still and has developed generalized body aches and weakness. Pt also complains of lightheadedness. Pt is aox4 and ambulatory.

## 2020-12-11 NOTE — Discharge Instructions (Signed)
COVID test pending, should return in approximately 2 to 4 days, monitor MyChart Ibuprofen and Tylenol for fevers body aches headaches Rest and fluids Zofran as needed for nausea and vomiting Tessalon for cough May continue over-the-counter medicine for further cough and congestion relief Follow-up if not improving or worsening

## 2020-12-11 NOTE — ED Provider Notes (Signed)
[name]    CSN: [id number] Arrival date & time: 12/11/20  1759      History   Chief Complaint Chief Complaint  Patient presents with  . Headache    Since yesterday  . Generalized Body Aches    today  . Dizziness    today  . Weakness    today    HPI Krystal Humphrey is a 35 y.o. female presenting today for evaluation of URI symptoms.  Reports body aches headaches cough and sore throat.  Reports at home COVID test positive.  Daughter with similar symptoms.  Reports significant body aches, dizziness, lightheadedness all beginning today.  HPI  Past Medical History:  Diagnosis Date  . Anxiety   . Depression   . GERD (gastroesophageal reflux disease)   . Hiatal hernia   . History of Clostridium difficile colitis    04/ 2011  . History of duodenal ulcer    2009  . History of esophagitis   . History of panic attacks   . Nephrolithiasis    bilateral per ct 07-27-2016  L > R  (right side nonobstructive)  . Urgency of urination     Patient Active Problem List   Diagnosis Date Noted  . Traumatic hematoma of knee, left, subsequent encounter 09/23/2020  . Pain and swelling of left knee 09/16/2020  . Contusion of left knee 09/16/2020  . Generalized anxiety disorder 06/20/2019  . Obesity 01/07/2015    Past Surgical History:  Procedure Laterality Date  . CYSTOSCOPY WITH RETROGRADE PYELOGRAM, URETEROSCOPY AND STENT PLACEMENT Left 07/30/2016   Procedure: CYSTOSCOPY WITH LEFT  RETROGRADE PYELOGRAM AND LEFT STENT PLACEMENT;  Surgeon: Malen Gauze, MD;  Location: [facility];  Service: Urology;  Laterality: Left;  . CYSTOSCOPY WITH RETROGRADE PYELOGRAM, URETEROSCOPY AND STENT PLACEMENT Left 08/17/2016   Procedure: CYSTOSCOPY WITH LEFT RETROGRADE  STENT EXCHANGE, URETEROSCOPY AND STONE EXTRACTION, LASER LITHOTRIPSY AND LEFT RETROGRADE PYELOGRAM;  Surgeon: Bjorn Pippin, MD;  Location: [facility];  Service: Urology;  Laterality: Left;  .  ESOPHAGOGASTRODUODENOSCOPY  last one 03-26-2009  . KNEE ARTHROSCOPY Right 03/19/2010  . TONSILLECTOMY  child    OB History    Gravida  2   Para  1   Term  1   Preterm      AB  1   Living  1     SAB  1   IAB      Ectopic      Multiple      Live Births  1            Home Medications    Prior to Admission medications   Medication Sig Start Date End Date Taking? Authorizing Provider  benzonatate (TESSALON) 200 MG capsule Take 1 capsule (200 mg total) by mouth 3 (three) times daily as needed for up to 7 days for cough. 12/11/20 12/18/20 Yes Wieters, Junius Creamer, PA-C  butalbital-acetaminophen-caffeine (FIORICET) [phone] MG tablet Take 1 tablet by mouth every 6 (six) hours as needed for headache. 04/21/20 04/21/21 Yes Janeece Agee, NP  cetirizine (ZYRTEC) 10 MG tablet Take 1 tablet (10 mg total) by mouth daily. 03/12/20  Yes Janeece Agee, NP  ibuprofen (ADVIL) 800 MG tablet Take 1 tablet (800 mg total) by mouth 3 (three) times daily. 12/11/20  Yes Wieters, Hallie C, PA-C  ondansetron (ZOFRAN ODT) 4 MG disintegrating tablet Take 1 tablet (4 mg total) by mouth every 8 (eight) hours as needed for nausea or vomiting. 12/11/20  Yes Wieters,  Hallie C, PA-C  ALPRAZolam (NIRAVAM) 0.5 MG dissolvable tablet Take 1 tablet (0.5 mg total) by mouth at bedtime as needed for anxiety. 09/26/20   Janeece Agee, NP  cyclobenzaprine (FLEXERIL) 5 MG tablet Take 1 tablet (5 mg total) by mouth 3 (three) times daily as needed for muscle spasms. 06/10/20   Janeece Agee, NP  famotidine (PEPCID) 20 MG tablet Take 1 tablet (20 mg total) by mouth daily. 06/25/20   Cathie Hoops, Amy V, PA-C  fluconazole (DIFLUCAN) 150 MG tablet Take 1 tablet (150 mg total) by mouth daily. Take second dose 72 hours later if symptoms still persists. 06/25/20   Belinda Fisher, PA-C  HYDROcodone-acetaminophen (NORCO/VICODIN) 5-325 MG tablet Take 1 tablet by mouth every 6 (six) hours as needed for moderate pain. 09/16/20   Jetty Peeks, PA-C  hydrOXYzine (ATARAX/VISTARIL) 10 MG tablet Take 1 tablet (10 mg total) by mouth 3 (three) times daily as needed. 09/15/20   Janeece Agee, NP  levonorgestrel-ethinyl estradiol (ALESSE) 0.1-20 MG-MCG tablet Take 1 tablet by mouth daily. 06/13/20   Janeece Agee, NP  sulfamethoxazole-trimethoprim (BACTRIM DS) 800-160 MG tablet Take 1 tablet by mouth 2 (two) times daily. 06/24/20   Janeece Agee, NP  amLODipine (NORVASC) 5 MG tablet Take 1 tablet (5 mg total) by mouth daily. 06/06/20 12/11/20  Janeece Agee, NP    Family History Family History  Problem Relation Age of Onset  . Diabetes Mother   . Thyroid disease Mother   . Heart attack Father   . Diabetes Maternal Aunt   . Diabetes Maternal Uncle   . COPD Maternal Grandmother   . Crohn's disease Maternal Grandmother   . Anesthesia problems Neg Hx   . Hypotension Neg Hx   . Malignant hyperthermia Neg Hx   . Pseudochol deficiency Neg Hx     Social History Social History   Tobacco Use  . Smoking status: Former Smoker    Packs/day: 0.25    Years: 2.00    Pack years: 0.50    Quit date: 09/20/2008    Years since quitting: 12.2  . Smokeless tobacco: Never Used  Vaping Use  . Vaping Use: Never used  Substance Use Topics  . Alcohol use: Yes    Comment: occasional  . Drug use: No     Allergies   Other, Remeron [mirtazapine], Flagyl [metronidazole], Lexapro [escitalopram oxalate], and Tramadol   Review of Systems Review of Systems  Constitutional: Positive for chills, fatigue and fever. Negative for activity change and appetite change.  HENT: Positive for congestion and sore throat. Negative for ear pain, rhinorrhea, sinus pressure and trouble swallowing.   Eyes: Negative for discharge and redness.  Respiratory: Positive for cough. Negative for chest tightness and shortness of breath.   Cardiovascular: Negative for chest pain.  Gastrointestinal: Positive for nausea. Negative for abdominal pain, diarrhea and  vomiting.  Musculoskeletal: Positive for myalgias.  Skin: Negative for rash.  Neurological: Positive for headaches. Negative for dizziness and light-headedness.     Physical Exam Triage Vital Signs ED Triage Vitals [12/11/20 1834]  Enc Vitals Group     BP (!) 152/90     Pulse Rate 85     Resp 20     Temp 99.9 F (37.7 C)     Temp Source Oral     SpO2 94 %     Weight      Height      Head Circumference      Peak Flow  Pain Score      Pain Loc      Pain Edu?      Excl. in GC?    No data found.  Updated Vital Signs BP (!) 152/90 (BP Location: Left Arm)   Pulse 85   Temp 99.9 F (37.7 C) (Oral)   Resp 20   SpO2 94%   Visual Acuity Right Eye Distance:   Left Eye Distance:   Bilateral Distance:    Right Eye Near:   Left Eye Near:    Bilateral Near:     Physical Exam Vitals and nursing note reviewed.  Constitutional:      Appearance: She is well-developed and well-nourished.     Comments: No acute distress  HENT:     Head: Normocephalic and atraumatic.     Ears:     Comments: Bilateral ears without tenderness to palpation of external auricle, tragus and mastoid, EAC's without erythema or swelling, TM's with good bony landmarks and cone of light. Non erythematous.     Nose: Nose normal.     Mouth/Throat:     Comments: Oral mucosa pink and moist, no tonsillar enlargement or exudate. Posterior pharynx patent and nonerythematous, no uvula deviation or swelling. Normal phonation. Eyes:     Conjunctiva/sclera: Conjunctivae normal.  Cardiovascular:     Rate and Rhythm: Normal rate and regular rhythm.  Pulmonary:     Effort: Pulmonary effort is normal. No respiratory distress.     Comments: Breathing comfortably at rest, CTABL, no wheezing, rales or other adventitious sounds auscultated Abdominal:     General: There is no distension.  Musculoskeletal:        General: Normal range of motion.     Cervical back: Neck supple.  Skin:    General: Skin is warm  and dry.  Neurological:     Mental Status: She is alert and oriented to person, place, and time.  Psychiatric:        Mood and Affect: Mood and affect normal.      UC Treatments / Results  Labs (all labs ordered are listed, but only abnormal results are displayed) Labs Reviewed  NOVEL CORONAVIRUS, NAA    EKG   Radiology No results found.  Procedures Procedures (including critical care time)  Medications Ordered in UC Medications - No data to display  Initial Impression / Assessment and Plan / UC Course  I have reviewed the triage vital signs and the nursing notes.  Pertinent labs & imaging results that were available during my care of the patient were reviewed by me and considered in my medical decision making (see chart for details).     High suspicion of COVID given at home test positive, COVID PCR pending.  Recommending symptomatic and supportive care rest and fluids.  Monitor for gradual resolution,Discussed strict return precautions. Patient verbalized understanding and is agreeable with plan.  Final Clinical Impressions(s) / UC Diagnoses   Final diagnoses:  Encounter for screening for COVID-19  Suspected COVID-19 virus infection     Discharge Instructions     COVID test pending, should return in approximately 2 to 4 days, monitor MyChart Ibuprofen and Tylenol for fevers body aches headaches Rest and fluids Zofran as needed for nausea and vomiting Tessalon for cough May continue over-the-counter medicine for further cough and congestion relief Follow-up if not improving or worsening    ED Prescriptions    Medication Sig Dispense Auth. Provider   ibuprofen (ADVIL) 800 MG tablet Take 1 tablet (800 mg  total) by mouth 3 (three) times daily. 21 tablet Wieters, Hallie C, PA-C   ondansetron (ZOFRAN ODT) 4 MG disintegrating tablet Take 1 tablet (4 mg total) by mouth every 8 (eight) hours as needed for nausea or vomiting. 20 tablet Wieters, Hallie C, PA-C    benzonatate (TESSALON) 200 MG capsule Take 1 capsule (200 mg total) by mouth 3 (three) times daily as needed for up to 7 days for cough. 28 capsule Wieters, [place], PA-C     PDMP not reviewed this encounter.   Wieters, [place]ie C, PA-C 12/11/20 2025

## 2020-12-13 LAB — SARS-COV-2, NAA 2 DAY TAT

## 2020-12-13 LAB — NOVEL CORONAVIRUS, NAA: SARS-CoV-2, NAA: DETECTED — AB

## 2021-01-10 ENCOUNTER — Inpatient Hospital Stay (HOSPITAL_COMMUNITY)
Admission: AD | Admit: 2021-01-10 | Discharge: 2021-01-10 | Disposition: A | Discharge status: Home / Self Care | Payer: Medicaid Other | Attending: Obstetrics and Gynecology | Admitting: Obstetrics and Gynecology

## 2021-01-10 ENCOUNTER — Other Ambulatory Visit: Payer: Self-pay

## 2021-01-10 ENCOUNTER — Encounter (HOSPITAL_COMMUNITY): Payer: Self-pay

## 2021-01-10 ENCOUNTER — Inpatient Hospital Stay (HOSPITAL_COMMUNITY): Payer: Medicaid Other

## 2021-01-10 DIAGNOSIS — R1084 Generalized abdominal pain: Secondary | ICD-10-CM

## 2021-01-10 DIAGNOSIS — R1030 Lower abdominal pain, unspecified: Secondary | ICD-10-CM | POA: Insufficient documentation

## 2021-01-10 DIAGNOSIS — O26891 Other specified pregnancy related conditions, first trimester: Secondary | ICD-10-CM | POA: Diagnosis not present

## 2021-01-10 DIAGNOSIS — Z87891 Personal history of nicotine dependence: Secondary | ICD-10-CM | POA: Insufficient documentation

## 2021-01-10 DIAGNOSIS — Z6790 Unspecified blood type, Rh positive: Secondary | ICD-10-CM

## 2021-01-10 DIAGNOSIS — R109 Unspecified abdominal pain: Secondary | ICD-10-CM

## 2021-01-10 DIAGNOSIS — Z3A01 Less than 8 weeks gestation of pregnancy: Secondary | ICD-10-CM | POA: Diagnosis not present

## 2021-01-10 DIAGNOSIS — I10 Essential (primary) hypertension: Secondary | ICD-10-CM | POA: Diagnosis not present

## 2021-01-10 DIAGNOSIS — O10011 Pre-existing essential hypertension complicating pregnancy, first trimester: Secondary | ICD-10-CM | POA: Diagnosis not present

## 2021-01-10 DIAGNOSIS — O161 Unspecified maternal hypertension, first trimester: Secondary | ICD-10-CM

## 2021-01-10 DIAGNOSIS — O3680X0 Pregnancy with inconclusive fetal viability, not applicable or unspecified: Secondary | ICD-10-CM

## 2021-01-10 DIAGNOSIS — Z3201 Encounter for pregnancy test, result positive: Secondary | ICD-10-CM | POA: Diagnosis not present

## 2021-01-10 DIAGNOSIS — Z3490 Encounter for supervision of normal pregnancy, unspecified, unspecified trimester: Secondary | ICD-10-CM

## 2021-01-10 LAB — COMPREHENSIVE METABOLIC PANEL
ALT: 35 U/L (ref 0–44)
AST: 31 U/L (ref 15–41)
Albumin: 4 g/dL (ref 3.5–5.0)
Alkaline Phosphatase: 50 U/L (ref 38–126)
Anion gap: 9 (ref 5–15)
BUN: 7 mg/dL (ref 6–20)
CO2: 25 mmol/L (ref 22–32)
Calcium: 9.3 mg/dL (ref 8.9–10.3)
Chloride: 104 mmol/L (ref 98–111)
Creatinine, Ser: 0.8 mg/dL (ref 0.44–1.00)
GFR, Estimated: >60 mL/min (ref >60)
Glucose, Bld: 96 mg/dL (ref 70–99)
Potassium: 3.8 mmol/L (ref 3.5–5.1)
Sodium: 138 mmol/L (ref 135–145)
Total Bilirubin: 0.5 mg/dL (ref 0.3–1.2)
Total Protein: 6.8 g/dL (ref 6.5–8.1)

## 2021-01-10 LAB — CBC
HCT: 39.3 % (ref 36.0–46.0)
Hemoglobin: 13.4 g/dL (ref 12.0–15.0)
MCH: 31.2 pg (ref 26.0–34.0)
MCHC: 34.1 g/dL (ref 30.0–36.0)
MCV: 91.4 fL (ref 80.0–100.0)
Platelets: 357 10*3/uL (ref 150–400)
RBC: 4.3 MIL/uL (ref 3.87–5.11)
RDW: 13.2 % (ref 11.5–15.5)
WBC: 15.4 10*3/uL — ABNORMAL HIGH (ref 4.0–10.5)
nRBC: 0 % (ref 0.0–0.2)

## 2021-01-10 LAB — URINALYSIS, ROUTINE W REFLEX MICROSCOPIC
Bacteria, UA: NONE SEEN
Bilirubin Urine: NEGATIVE
Glucose, UA: NEGATIVE mg/dL
Ketones, ur: NEGATIVE mg/dL
Leukocytes,Ua: NEGATIVE
Nitrite: NEGATIVE
Protein, ur: NEGATIVE mg/dL
Specific Gravity, Urine: 1.004 — ABNORMAL LOW (ref 1.005–1.030)
pH: 7 (ref 5.0–8.0)

## 2021-01-10 LAB — HCG, QUANTITATIVE, PREGNANCY: hCG, Beta Chain, Quant, S: 5548 m[IU]/mL — ABNORMAL HIGH (ref <5)

## 2021-01-10 LAB — WET PREP, GENITAL
Sperm: NONE SEEN
Trich, Wet Prep: NONE SEEN
WBC, Wet Prep HPF POC: NONE SEEN
Yeast Wet Prep HPF POC: NONE SEEN

## 2021-01-10 LAB — POC URINE PREG, ED: Preg Test, Ur: POSITIVE — AB

## 2021-01-10 MED ORDER — LABETALOL HCL 100 MG PO TABS: 100.0000 mg | ORAL_TABLET | Freq: Two times a day (BID) | ORAL | 0 refills | Status: DC

## 2021-01-10 MED ORDER — LABETALOL HCL 200 MG PO TABS: 200.0000 mg | ORAL_TABLET | Freq: Two times a day (BID) | ORAL | 0 refills | Status: DC

## 2021-01-10 NOTE — MAU Provider Note (Addendum)
History     CSN: [id number]  Arrival date and time: 01/10/21 2018   Event Date/Time   First Provider Initiated Contact with Patient 01/10/21 2127      Chief Complaint  Patient presents with  . Possible Pregnancy  . Abdominal Pain   Ms. Shelia Kingsberry is a 35 y.o. G3P1011 at [redacted]w[redacted]d who presents to MAU for lower abdominal cramping that started this morning. Patient states she thought her period was coming on as the cramping feels like menstrual cramps, but because she has irregular periods she took a pregnancy test and it was positive. Patient is upset by the news of her pregnancy. Patient reports known HTN and states she was on medication but she stopped taking it and her pressures were normal when she stopped, so she never restarted. She does not remember the last time she saw her primary care provider.  Pt denies VB, LOF, ctx, decreased FM, vaginal discharge/odor/itching. Pt denies N/V, constipation, diarrhea, or urinary problems. Pt denies fever, chills, fatigue, sweating or changes in appetite. Pt denies SOB or chest pain. Pt denies dizziness, HA, light-headedness, weakness.   OB History    Gravida  3   Para  1   Term  1   Preterm      AB  1   Living  1     SAB  1   IAB      Ectopic      Multiple      Live Births  1           Past Medical History:  Diagnosis Date  . Anxiety   . Depression   . GERD (gastroesophageal reflux disease)   . Hiatal hernia   . History of Clostridium difficile colitis    04/ 2011  . History of duodenal ulcer    2009  . History of esophagitis   . History of panic attacks   . Nephrolithiasis    bilateral per ct 07-27-2016  L > R  (right side nonobstructive)  . Urgency of urination     Past Surgical History:  Procedure Laterality Date  . CYSTOSCOPY WITH RETROGRADE PYELOGRAM, URETEROSCOPY AND STENT PLACEMENT Left 07/30/2016   Procedure: CYSTOSCOPY WITH LEFT  RETROGRADE PYELOGRAM AND LEFT STENT PLACEMENT;  Surgeon: Malen Gauze, MD;  Location: [facility];  Service: Urology;  Laterality: Left;  . CYSTOSCOPY WITH RETROGRADE PYELOGRAM, URETEROSCOPY AND STENT PLACEMENT Left 08/17/2016   Procedure: CYSTOSCOPY WITH LEFT RETROGRADE  STENT EXCHANGE, URETEROSCOPY AND STONE EXTRACTION, LASER LITHOTRIPSY AND LEFT RETROGRADE PYELOGRAM;  Surgeon: Bjorn Pippin, MD;  Location: [facility];  Service: Urology;  Laterality: Left;  . ESOPHAGOGASTRODUODENOSCOPY  last one 03-26-2009  . KNEE ARTHROSCOPY Right 03/19/2010  . TONSILLECTOMY  child    Family History  Problem Relation Age of Onset  . Diabetes Mother   . Thyroid disease Mother   . Heart attack Father   . Diabetes Maternal Aunt   . Diabetes Maternal Uncle   . COPD Maternal Grandmother   . Crohn's disease Maternal Grandmother   . Anesthesia problems Neg Hx   . Hypotension Neg Hx   . Malignant hyperthermia Neg Hx   . Pseudochol deficiency Neg Hx     Social History   Tobacco Use  . Smoking status: Former Smoker    Packs/day: 0.25    Years: 2.00    Pack years: 0.50    Quit date: 09/20/2008    Years since quitting: 12.3  . Smokeless tobacco:  Never Used  Vaping Use  . Vaping Use: Never used  Substance Use Topics  . Alcohol use: Yes    Comment: occasional  . Drug use: No    Allergies:  Allergies  Allergen Reactions  . Other Anaphylaxis and Itching  . Remeron [Mirtazapine] Anaphylaxis  . Flagyl [Metronidazole] Nausea And Vomiting  . Lexapro [Escitalopram Oxalate]     hallucinations  . Tramadol Itching    Medications Prior to Admission  Medication Sig Dispense Refill Last Dose  . ALPRAZolam (NIRAVAM) 0.5 MG dissolvable tablet Take 1 tablet (0.5 mg total) by mouth at bedtime as needed for anxiety. 30 tablet 0   . butalbital-acetaminophen-caffeine (FIORICET) 50-325-40 MG tablet Take 1 tablet by mouth every 6 (six) hours as needed for headache. 20 tablet 0   . cetirizine (ZYRTEC) 10 MG tablet Take 1 tablet (10 mg total) by mouth daily. 30  tablet 11   . cyclobenzaprine (FLEXERIL) 5 MG tablet Take 1 tablet (5 mg total) by mouth 3 (three) times daily as needed for muscle spasms. 30 tablet 1   . famotidine (PEPCID) 20 MG tablet Take 1 tablet (20 mg total) by mouth daily. 10 tablet 0   . fluconazole (DIFLUCAN) 150 MG tablet Take 1 tablet (150 mg total) by mouth daily. Take second dose 72 hours later if symptoms still persists. 2 tablet 0   . HYDROcodone-acetaminophen (NORCO/VICODIN) 5-325 MG tablet Take 1 tablet by mouth every 6 (six) hours as needed for moderate pain. 30 tablet 0   . hydrOXYzine (ATARAX/VISTARIL) 10 MG tablet Take 1 tablet (10 mg total) by mouth 3 (three) times daily as needed. 30 tablet 0   . ibuprofen (ADVIL) 800 MG tablet Take 1 tablet (800 mg total) by mouth 3 (three) times daily. 21 tablet 0   . levonorgestrel-ethinyl estradiol (ALESSE) 0.1-20 MG-MCG tablet Take 1 tablet by mouth daily. 84 tablet 4   . ondansetron (ZOFRAN ODT) 4 MG disintegrating tablet Take 1 tablet (4 mg total) by mouth every 8 (eight) hours as needed for nausea or vomiting. 20 tablet 0   . sulfamethoxazole-trimethoprim (BACTRIM DS) 800-160 MG tablet Take 1 tablet by mouth 2 (two) times daily. 6 tablet 0     Review of Systems  Constitutional: Negative for chills, diaphoresis, fatigue and fever.  Eyes: Negative for visual disturbance.  Respiratory: Negative for shortness of breath.   Cardiovascular: Negative for chest pain.  Gastrointestinal: Positive for abdominal pain. Negative for constipation, diarrhea, nausea and vomiting.  Genitourinary: Negative for dysuria, flank pain, frequency, pelvic pain, urgency, vaginal bleeding and vaginal discharge.  Neurological: Negative for dizziness, weakness, light-headedness and headaches.   Physical Exam   Blood pressure (!) 153/76, pulse 89, temperature 98 F (36.7 C), temperature source Oral, resp. rate 16, height  (1.727 m), weight 103.6 kg, last menstrual period 12/05/2020, SpO2 99  %.  Patient Vitals for the past 24 hrs:  BP Temp Temp src Pulse Resp SpO2 Height Weight  01/10/21 2240 (!) 153/76 -- -- 89 -- -- -- --  01/10/21 2118 (!) 169/85 -- -- 85 -- --  (1.727 m) 103.6 kg  01/10/21 2023 (!) 167/90 98 F (36.7 C) Oral 97 16 99 % -- --   Physical Exam Vitals and nursing note reviewed.  Constitutional:      General: She is not in acute distress.    Appearance: Normal appearance. She is not ill-appearing, toxic-appearing or diaphoretic.  HENT:     Head: Normocephalic and atraumatic.  Pulmonary:  Effort: Pulmonary effort is normal.  Neurological:     Mental Status: She is alert and oriented to person, place, and time.  Psychiatric:        Mood and Affect: Mood normal.        Behavior: Behavior normal.        Thought Content: Thought content normal.        Judgment: Judgment normal.    Results for orders placed or performed during the hospital encounter of 01/10/21 (from the past 24 hour(s))  POC Urine Pregnancy, ED (not at [facility])     Status: Abnormal   Collection Time: 01/10/21  8:36 PM  Result Value Ref Range   Preg Test, Ur POSITIVE (A) NEGATIVE  Urinalysis, Routine w reflex microscopic Urine, Clean Catch     Status: Abnormal   Collection Time: 01/10/21  9:21 PM  Result Value Ref Range   Color, Urine STRAW (A) YELLOW   APPearance HAZY (A) CLEAR   Specific Gravity, Urine 1.004 (L) 1.005 - 1.030   pH 7.0 5.0 - 8.0   Glucose, UA NEGATIVE NEGATIVE mg/dL   Hgb urine dipstick SMALL (A) NEGATIVE   Bilirubin Urine NEGATIVE NEGATIVE   Ketones, ur NEGATIVE NEGATIVE mg/dL   Protein, ur NEGATIVE NEGATIVE mg/dL   Nitrite NEGATIVE NEGATIVE   Leukocytes,Ua NEGATIVE NEGATIVE   RBC / HPF 0-5 0 - 5 RBC/hpf   WBC, UA 0-5 0 - 5 WBC/hpf   Bacteria, UA NONE SEEN NONE SEEN   Squamous Epithelial / LPF 6-10 0 - 5  CBC     Status: Abnormal   Collection Time: 01/10/21  9:44 PM  Result Value Ref Range   WBC 15.4 (H) 4.0 - 10.5 K/uL   RBC 4.30 3.87 - 5.11 MIL/uL    Hemoglobin 13.4 12.0 - 15.0 g/dL   HCT 16.1 09.6 - 04.5 %   MCV 91.4 80.0 - 100.0 fL   MCH 31.2 26.0 - 34.0 pg   MCHC 34.1 30.0 - 36.0 g/dL   RDW 40.9 81.1 - 91.4 %   Platelets 357 150 - 400 K/uL   nRBC 0.0 0.0 - 0.2 %  Comprehensive metabolic panel     Status: None   Collection Time: 01/10/21  9:44 PM  Result Value Ref Range   Sodium 138 135 - 145 mmol/L   Potassium 3.8 3.5 - 5.1 mmol/L   Chloride 104 98 - 111 mmol/L   CO2 25 22 - 32 mmol/L   Glucose, Bld 96 70 - 99 mg/dL   BUN 7 6 - 20 mg/dL   Creatinine, Ser 7.82 0.44 - 1.00 mg/dL   Calcium 9.3 8.9 - 95.6 mg/dL   Total Protein 6.8 6.5 - 8.1 g/dL   Albumin 4.0 3.5 - 5.0 g/dL   AST 31 15 - 41 U/L   ALT 35 0 - 44 U/L   Alkaline Phosphatase 50 38 - 126 U/L   Total Bilirubin 0.5 0.3 - 1.2 mg/dL   GFR, Estimated >21 >30 mL/min   Anion gap 9 5 - 15  hCG, quantitative, pregnancy     Status: Abnormal   Collection Time: 01/10/21  9:44 PM  Result Value Ref Range   hCG, Beta Chain, Quant, S 5,548 (H) <5 mIU/mL  ABO/Rh     Status: None   Collection Time: 01/10/21  9:44 PM  Result Value Ref Range   ABO/RH(D)      A POS Performed at [facility], [address], [place], [place] [address]  Wet prep, genital     Status: Abnormal   Collection Time: 01/10/21  9:55 PM  Result Value Ref Range   Yeast Wet Prep HPF POC NONE SEEN NONE SEEN   Trich, Wet Prep NONE SEEN NONE SEEN   Clue Cells Wet Prep HPF POC PRESENT (A) NONE SEEN   WBC, Wet Prep HPF POC NONE SEEN NONE SEEN   Sperm NONE SEEN    US OB LESS THAN 14 WEEKS WITH OB TRANSVAGINAL  Result Date: 01/10/2021 CLINICAL DATA:  Pelvic cramping. Pregnant patient, 5 weeks and 1 day based patient on the last menstrual period. EXAM: OBSTETRIC <14 WK [place] AND TRANSVAGINAL OB US TECHNIQUE: Both transabdominal and transvaginal ultrasound examinations were performed for complete evaluation of the gestation as well as the maternal uterus, adnexal regions, and pelvic cul-de-sac.  Transvaginal technique was performed to assess early pregnancy. COMPARISON:  None. FINDINGS: Intrauterine gestational sac: Single Yolk sac:  Visualized. Embryo:  Not Visualized. Cardiac Activity: Not Visualized. MSD: 7.0 mm   5 w   3 d Subchorionic hemorrhage:  None visualized. Maternal uterus/adnexae: No uterine masses. Cervix is closed. Normal ovaries and adnexa. No pelvic free fluid. IMPRESSION: 1. Findings consistent with an early intrauterine pregnancy with a gestational sac and yolk sac, but no visible embryo or fetal heart activity. No evidence of a pregnancy complication. 2. Recommend follow-up beta HCG levels and repeat ultrasound in 10-14 days to document normal pregnancy progression. Electronically Signed   By: Amie Portland M.D.   On: 01/10/2021 22:24    MAU Course  Procedures  MDM -r/o ectopic -UA: straw/hazy/SG1.004/sm hgb, sending urine for culture -CBC: WNL for pregnancy -CMP: WNL -[place]: single GS, +yolk sac, no embryo -hCG: pending at time of discharge -ABO: A Positive -WetPrep: +ClueCells (isolated finding not requiring treatment) -GC/CT collected -consulted with Dr. Donavan Foil about pt BP, will start on labetalol 200mg  BID with BP check in one week at [facility] -pt discharged to home in stable condition  Orders Placed This Encounter  Procedures  . Wet prep, genital    Standing Status:   Standing    Number of Occurrences:   1  . Culture, OB Urine    Standing Status:   Standing    Number of Occurrences:   1  . OB LESS THAN 14 WEEKS WITH OB TRANSVAGINAL    Standing Status:   Standing    Number of Occurrences:   1    Order Specific Question:   Symptom/Reason for Exam    Answer:   Abdominal cramping [[id number]]  . US OB LESS THAN 14 WEEKS WITH OB TRANSVAGINAL    Standing Status:   Future    Standing Expiration Date:   01/10/2022    Scheduling Instructions:     Please schedule patient for follow-up 03/10/2022, Monday - Thursday between 8:00 am - 10:00 am and 12:00 pm - 3:00 pm. The  patient will follow-up with [facility] immediately after 03-14-1981 for results.    Order Specific Question:   Reason for Exam (SYMPTOM  OR DIAGNOSIS REQUIRED)    Answer:   viability    Order Specific Question:   Preferred Imaging Location?    Answer:   [facility]  . US OB LESS THAN 14 WEEKS WITH OB TRANSVAGINAL    Standing Status:   Future    Standing Expiration Date:   01/10/2022    Order Specific Question:   Reason for Exam (SYMPTOM  OR DIAGNOSIS REQUIRED)    Answer:  viability    Order Specific Question:   Preferred Imaging Location?    Answer:   [facility]  . Urinalysis, Routine w reflex microscopic Urine, Clean Catch    Standing Status:   Standing    Number of Occurrences:   1  . CBC    Standing Status:   Standing    Number of Occurrences:   1  . Comprehensive metabolic panel    Standing Status:   Standing    Number of Occurrences:   1  . hCG, quantitative, pregnancy    Standing Status:   Standing    Number of Occurrences:   1  . POC Urine Pregnancy, ED (not at [facility])    Standing Status:   Standing    Number of Occurrences:   1  . ABO/Rh    Standing Status:   Standing    Number of Occurrences:   1  . Discharge patient    Order Specific Question:   Discharge disposition    Answer:   01-Home or Self Care [1]    Order Specific Question:   Discharge patient date    Answer:   01/10/2021   Meds ordered this encounter  Medications  . DISCONTD: labetalol (NORMODYNE) 100 MG tablet    Sig: Take 1 tablet (100 mg total) by mouth 2 (two) times daily.    Dispense:  60 tablet    Refill:  0    Order Specific Question:   Supervising Provider    Answer:   Mariel Aloe A [[id number]]  . labetalol (NORMODYNE) 200 MG tablet    Sig: Take 1 tablet (200 mg total) by mouth 2 (two) times daily.    Dispense:  60 tablet    Refill:  0    Order Specific Question:   Supervising Provider    Answer:   Mariel Aloe A [[id number]]   Assessment and Plan   1. Intrauterine pregnancy   2. Abdominal  cramping   3. [redacted] weeks gestation of pregnancy   4. Blood type, Rh positive   5. Pregnancy with uncertain fetal viability, single or unspecified fetus   6. Hypertension affecting pregnancy in first trimester     Allergies as of 01/10/2021      Reactions   Other Anaphylaxis, Itching   Remeron [mirtazapine] Anaphylaxis   Flagyl [metronidazole] Nausea And Vomiting   Lexapro [escitalopram Oxalate]    hallucinations   Tramadol Itching      Medication List    STOP taking these medications   ibuprofen 800 MG tablet Commonly known as: ADVIL   levonorgestrel-ethinyl estradiol 0.1-20 MG-MCG tablet Commonly known as: ALESSE     TAKE these medications   ALPRAZolam 0.5 MG dissolvable tablet Commonly known as: NIRAVAM Take 1 tablet (0.5 mg total) by mouth at bedtime as needed for anxiety.   butalbital-acetaminophen-caffeine 50-325-40 MG tablet Commonly known as: FIORICET Take 1 tablet by mouth every 6 (six) hours as needed for headache.   cetirizine 10 MG tablet Commonly known as: ZYRTEC Take 1 tablet (10 mg total) by mouth daily.   cyclobenzaprine 5 MG tablet Commonly known as: FLEXERIL Take 1 tablet (5 mg total) by mouth 3 (three) times daily as needed for muscle spasms.   famotidine 20 MG tablet Commonly known as: PEPCID Take 1 tablet (20 mg total) by mouth daily.   fluconazole 150 MG tablet Commonly known as: Diflucan Take 1 tablet (150 mg total) by mouth daily. Take second dose 72 hours later if symptoms still  persists.   HYDROcodone-acetaminophen 5-325 MG tablet Commonly known as: NORCO/VICODIN Take 1 tablet by mouth every 6 (six) hours as needed for moderate pain.   hydrOXYzine 10 MG tablet Commonly known as: ATARAX/VISTARIL Take 1 tablet (10 mg total) by mouth 3 (three) times daily as needed.   labetalol 200 MG tablet Commonly known as: NORMODYNE Take 1 tablet (200 mg total) by mouth 2 (two) times daily.   ondansetron 4 MG disintegrating tablet Commonly known  as: Zofran ODT Take 1 tablet (4 mg total) by mouth every 8 (eight) hours as needed for nausea or vomiting.   sulfamethoxazole-trimethoprim 800-160 MG tablet Commonly known as: BACTRIM DS Take 1 tablet by mouth 2 (two) times daily.      -will call with culture results, if positive -RX labetalol  -[facility] one week for BP check -f/u US ordered for 2 weeks for viability -return MAU precautions given -pt discharged to home in stable condition  Joni Reiningicole E Candiace West 01/10/2021, 11:32 PM

## 2021-01-10 NOTE — MAU Note (Signed)
Pt sent over from [facility]  c/o abd cramping.  Denies any vag bleeding or discharge. Cramping started yesterday felt like she was going to start her period has irregular periods but did have a period on Jan 7,2022.

## 2021-01-10 NOTE — ED Triage Notes (Signed)
Pt states that she took a home pregnancy test and it was positive, wants to confirm

## 2021-01-10 NOTE — Discharge Instructions (Signed)
Hypertension During Pregnancy High blood pressure (hypertension) is when the force of blood pumping through the arteries is high enough to cause problems with your health. Arteries are blood vessels that carry blood from the heart throughout the body. Hypertension during pregnancy can cause problems for you and your baby. It can be mild or severe. There are different types of hypertension that can happen during pregnancy. These include:  Chronic hypertension. This happens when you had high blood pressure before you became pregnant, and it continues during the pregnancy. Hypertension that develops before you are [redacted] weeks pregnant and continues during the pregnancy is also called chronic hypertension. If you have chronic hypertension, it will not go away after you have your baby. You will need follow-up visits with your health care provider after you have your baby. Your health care provider may want you to keep taking medicine for your blood pressure.  Gestational hypertension. This is hypertension that develops after the 20th week of pregnancy. Gestational hypertension usually goes away after you have your baby, but your health care provider will need to monitor your blood pressure to make sure that it is getting better.  Postpartum hypertension. This is high blood pressure that was present before delivery and continues after delivery or that starts after delivery. This usually occurs within 48 hours after childbirth but may occur up to 6 weeks after giving birth. When hypertension during pregnancy is severe, it is a medical emergency that requires treatment right away. How does this affect me? Women who have hypertension during pregnancy have a greater chance of developing hypertension later in life or during future pregnancies. In some cases, hypertension during pregnancy can cause serious complications, such as:  Stroke.  Heart attack.  Injury to other organs, such as kidneys, lungs, or  liver.  Preeclampsia.  A condition called hemolysis, elevated liver enzymes, and low platelet count (HELLP) syndrome.  Convulsions or seizures.  Placental abruption. How does this affect my baby? Hypertension during pregnancy can affect your baby. Your baby may:  Be born early (prematurely).  Not weigh as much as he or she should at birth (low birth weight).  Not tolerate labor well, leading to an unplanned cesarean delivery. This condition may also result in a baby's death before birth (stillbirth). What are the risks? There are certain factors that make it more likely for you to develop hypertension during pregnancy. These include:  Having hypertension during a previous pregnancy or a family history of hypertension.  Being overweight.  Being age 35 or older.  Being pregnant for the first time.  Being pregnant with more than one baby.  Becoming pregnant using fertilization methods, such as IVF (in vitro fertilization).  Having other medical problems, such as diabetes, kidney disease, or lupus. What can I do to lower my risk? The exact cause of hypertension during pregnancy is not known. You may be able to lower your risk by:  Maintaining a healthy weight.  Eating a healthy and balanced diet.  Following your health care provider's instructions about treating any long-term conditions that you had before becoming pregnant. It is very important to keep all of your prenatal care appointments. Your health care provider will check your blood pressure and make sure that your pregnancy is progressing as expected. If a problem is found, early treatment can prevent complications.   How is this treated? Treatment for hypertension during pregnancy varies depending on the type of hypertension you have and how serious it is.  If you were   taking medicine for high blood pressure before you became pregnant, talk with your health care provider. You may need to change medicine during  pregnancy because some medicines, like ACE inhibitors, may not be considered safe for your baby.  If you have gestational hypertension, your health care provider may order medicine to treat this during pregnancy.  If you are at risk for preeclampsia, your health care provider may recommend that you take a low-dose aspirin during your pregnancy.  If you have severe hypertension, you may need to be hospitalized so you and your baby can be monitored closely. You may also need to be given medicine to lower your blood pressure.  In some cases, if your condition gets worse, you may need to deliver your baby early. Follow these instructions at home: Eating and drinking  Drink enough fluid to keep your urine pale yellow.  Avoid caffeine.   Lifestyle  Do not use any products that contain nicotine or tobacco. These products include cigarettes, chewing tobacco, and vaping devices, such as e-cigarettes. If you need help quitting, ask your health care provider.  Do not use alcohol or drugs.  Avoid stress as much as possible.  Rest and get plenty of sleep.  Regular exercise can help to reduce your blood pressure. Ask your health care provider what kinds of exercise are best for you. General instructions  Take over-the-counter and prescription medicines only as told by your health care provider.  Keep all prenatal and follow-up visits. This is important. Contact a health care provider if:  You have symptoms that your health care provider told you may require more treatment or monitoring, such as: ? Headaches. ? Nausea or vomiting. ? Abdominal pain. ? Dizziness. ? Light-headedness. Get help right away if:  You have symptoms of serious complications, such as: ? Severe abdominal pain that does not get better with treatment. ? A severe headache that does not get better, blurred vision, or double vision. ? Vomiting that does not get better. ? Sudden, rapid weight gain or swelling in your  hands, ankles, or face. ? Vaginal bleeding. ? Blood in your urine. ? Shortness of breath or chest pain. ? Weakness on one side of your body or difficulty speaking.  Your baby is not moving as much as usual. These symptoms may represent a serious problem that is an emergency. Do not wait to see if the symptoms will go away. Get medical help right away. Call your local emergency services (911 in the [place] Do not drive yourself to the hospital. Summary  Hypertension during pregnancy can cause problems for you and your baby.  Treatment for hypertension during pregnancy varies depending on the type of hypertension you have and how serious it is.  Keep all prenatal and follow-up visits. This is important.  Get help right away if you have symptoms of serious complications related to high blood pressure. This information is not intended to replace advice given to you by your health care provider. Make sure you discuss any questions you have with your health care provider. Document Revised: 08/07/2020 Document Reviewed: 08/07/2020 Elsevier Patient Education  2021 Elsevier Inc.        Obstetrics: Normal and Problem Pregnancies (7th ed., pp. 102-121). [place], [place]: Elsevier."> Textbook of Family Medicine (9th ed., pp. [phone]). [place], [place]: [employer].">  First Trimester of Pregnancy  The first trimester of pregnancy starts on the first day of your last menstrual period until the end of week 12. This is months 1 through  3 of pregnancy. A week after a sperm fertilizes an egg, the egg will implant into the wall of the uterus and begin to develop into a baby. By the end of 12 weeks, all the baby's organs will be formed and the baby will be 2-3 inches in size. Body changes during your first trimester Your body goes through many changes during pregnancy. The changes vary and generally return to normal after your baby is born. Physical changes  You may gain or lose weight.  Your  breasts may begin to grow larger and become tender. The tissue that surrounds your nipples (areola) may become darker.  Dark spots or blotches (chloasma or mask of pregnancy) may develop on your face.  You may have changes in your hair. These can include thickening or thinning of your hair or changes in texture. Health changes  You may feel nauseous, and you may vomit.  You may have heartburn.  You may develop headaches.  You may develop constipation.  Your gums may bleed and may be sensitive to brushing and flossing. Other changes  You may tire easily.  You may urinate more often.  Your menstrual periods will stop.  You may have a loss of appetite.  You may develop cravings for certain kinds of food.  You may have changes in your emotions from day to day.  You may have more vivid and strange dreams. Follow these instructions at home: Medicines  Follow your health care provider's instructions regarding medicine use. Specific medicines may be either safe or unsafe to take during pregnancy. Do not take any medicines unless told to by your health care provider.  Take a prenatal vitamin that contains at least 600 micrograms (mcg) of folic acid. Eating and drinking  Eat a healthy diet that includes fresh fruits and vegetables, whole grains, good sources of protein such as meat, eggs, or tofu, and low-fat dairy products.  Avoid raw meat and unpasteurized juice, milk, and cheese. These carry germs that can harm you and your baby.  If you feel nauseous or you vomit: ? Eat 4 or 5 small meals a day instead of 3 large meals. ? Try eating a few soda crackers. ? Drink liquids between meals instead of during meals.  You may need to take these actions to prevent or treat constipation: ? Drink enough fluid to keep your urine pale yellow. ? Eat foods that are high in fiber, such as beans, whole grains, and fresh fruits and vegetables. ? Limit foods that are high in fat and processed  sugars, such as fried or sweet foods. Activity  Exercise only as directed by your health care provider. Most people can continue their usual exercise routine during pregnancy. Try to exercise for 30 minutes at least 5 days a week.  Stop exercising if you develop pain or cramping in the lower abdomen or lower back.  Avoid exercising if it is very hot or humid or if you are at high altitude.  Avoid heavy lifting.  If you choose to, you may have sex unless your health care provider tells you not to. Relieving pain and discomfort  Wear a good support bra to relieve breast tenderness.  Rest with your legs elevated if you have leg cramps or low back pain.  If you develop bulging veins (varicose veins) in your legs: ? Wear support hose as told by your health care provider. ? Elevate your feet for 15 minutes, 3-4 times a day. ? Limit salt in your diet.  Safety  Wear your seat belt at all times when driving or riding in a car.  Talk with your health care provider if someone is verbally or physically abusive to you.  Talk with your health care provider if you are feeling sad or have thoughts of hurting yourself. Lifestyle  Do not use hot tubs, steam rooms, or saunas.  Do not douche. Do not use tampons or scented sanitary pads.  Do not use herbal remedies, alcohol, illegal drugs, or medicines that are not approved by your health care provider. Chemicals in these products can harm your baby.  Do not use any products that contain nicotine or tobacco, such as cigarettes, e-cigarettes, and chewing tobacco. If you need help quitting, ask your health care provider.  Avoid cat litter boxes and soil used by cats. These carry germs that can cause birth defects in the baby and possibly loss of the unborn baby (fetus) by miscarriage or stillbirth. General instructions  During routine prenatal visits in the first trimester, your health care provider will do a physical exam, perform necessary tests,  and ask you how things are going. Keep all follow-up visits. This is important.  Ask for help if you have counseling or nutritional needs during pregnancy. Your health care provider can offer advice or refer you to specialists for help with various needs.  Schedule a dentist appointment. At home, brush your teeth with a soft toothbrush. Floss gently.  Write down your questions. Take them to your prenatal visits. Where to find more information  American Pregnancy Association: americanpregnancy.org  [employer]: https://www.todd-brady.net/  Office on [employer] [facility]: [employer] Contact a health care provider if you have:  Dizziness.  A fever.  Mild pelvic cramps, pelvic pressure, or nagging pain in the abdominal area.  Nausea, vomiting, or diarrhea that lasts for 24 hours or longer.  A bad-smelling vaginal discharge.  Pain when you urinate.  Known exposure to a contagious illness, such as chickenpox, measles, Zika virus, HIV, or hepatitis. Get help right away if you have:  Spotting or bleeding from your vagina.  Severe abdominal cramping or pain.  Shortness of breath or chest pain.  Any kind of trauma, such as from a fall or a car crash.  New or increased pain, swelling, or redness in an arm or leg. Summary  The first trimester of pregnancy starts on the first day of your last menstrual period until the end of week 12 (months 1 through 3).  Eating 4 or 5 small meals a day rather than 3 large meals may help to relieve nausea and vomiting.  Do not use any products that contain nicotine or tobacco, such as cigarettes, e-cigarettes, and chewing tobacco. If you need help quitting, ask your health care provider.  Keep all follow-up visits. This is important. This information is not intended to replace advice given to you by your health care provider. Make sure you discuss any questions you have with your health  care provider. Document Revised: 04/23/2020 Document Reviewed: 02/28/2020 Elsevier Patient Education  2021 Elsevier Inc.                        Safe Medications in Pregnancy    Acne: Benzoyl Peroxide Salicylic Acid  Backache/Headache: Tylenol: 2 regular strength every 4 hours OR              2 Extra strength every 6 hours  Colds/Coughs/Allergies: Benadryl (alcohol free) 25 mg every 6 hours  as needed Breath right strips Claritin Cepacol throat lozenges Chloraseptic throat spray Cold-Eeze- up to three times per day Cough drops, alcohol free Flonase (by prescription only) Guaifenesin Mucinex Robitussin DM (plain only, alcohol free) Saline nasal spray/drops Sudafed (pseudoephedrine) & Actifed ** use only after [redacted] weeks gestation and if you do not have high blood pressure Tylenol Vicks Vaporub Zinc lozenges Zyrtec   Constipation: Colace Ducolax suppositories Fleet enema Glycerin suppositories Metamucil Milk of magnesia Miralax Senokot Smooth move tea  Diarrhea: Kaopectate Imodium A-D  *NO pepto Bismol  Hemorrhoids: Anusol Anusol HC Preparation H Tucks  Indigestion: Tums Maalox Mylanta Zantac  Pepcid  Insomnia: Benadryl (alcohol free)  every 6 hours as needed Tylenol PM Unisom, no Gelcaps  Leg Cramps: Tums MagGel  Nausea/Vomiting:  Bonine Dramamine Emetrol Ginger extract Sea bands Meclizine  Nausea medication to take during pregnancy:  Unisom (doxylamine succinate 25 mg tablets) Take one tablet daily at bedtime. If symptoms are not adequately controlled, the dose can be increased to a maximum recommended dose of two tablets daily (1/2 tablet in the morning, 1/2 tablet mid-afternoon and one at bedtime). Vitamin B6  tablets. Take one tablet twice a day (up to 200 mg per day).  Skin Rashes: Aveeno products Benadryl cream or  every 6 hours as needed Calamine Lotion 1% cortisone cream  Yeast infection: Gyne-lotrimin  7 Monistat 7   **If taking multiple medications, please check labels to avoid duplicating the same active ingredients **take medication as directed on the label ** Do not exceed 4000 mg of tylenol in 24 hours **Do not take medications that contain aspirin or ibuprofen

## 2021-01-10 NOTE — ED Triage Notes (Signed)
Emergency Medicine Provider OB Triage Evaluation Note  Krystal Humphrey is a 35 y.o. female, G2P1011, at Unknown gestation who presents to the emergency department with complaints of missed period, lower abdominal cramping.  Patient states her last period ended beginning of January, however she is very irregular.  She has been having lower abdominal cramping.  She took a pregnancy test at home as her period is currently late, was positive.  She is in the ER for confirmation and for evaluation of her pregnancy.  She reports bilateral lower abdominal cramping, no vaginal discharge or vaginal bleeding.  Her last pregnancy was 9 years ago.  She does not want to be pregnant.  She has a history of high blood pressure, does not take anything for it.  Review of  Systems  Positive: lower abd cramping Negative: vaginal bleeding  Physical Exam  BP (!) 167/90 (BP Location: Left Arm)   Pulse 97   Temp 98 F (36.7 C) (Oral)   Resp 16   LMP 12/01/2020   SpO2 99%  General: Awake, no distress  HEENT: Atraumatic  Resp: Normal effort  Cardiac: Normal rate Abd: Nondistended, nontender  MSK: Moves all extremities without difficulty Neuro: Speech clear  Medical Decision Making  Pt evaluated for pregnancy concern and is stable for transfer to MAU. Pt is in agreement with plan for transfer.  8:45 PM Discussed with MAU APP, Joni Reining, who accepts patient in transfer.  Clinical Impression  No diagnosis found.     Alveria Apley, PA-C 01/10/21 2046

## 2021-01-10 NOTE — MAU Note (Signed)
Pt presents to the MAU for abdominal cramping beginning 01/09/2021.  Pt reports LMP was approximately 12/05/2020.  Pt denies vaginal bleeding.

## 2021-01-10 NOTE — ED Notes (Signed)
Report given to MAU 

## 2021-01-11 LAB — ABO/RH: ABO/RH(D): A POS

## 2021-01-12 LAB — GC/CHLAMYDIA PROBE AMP (~~LOC~~) NOT AT ARMC
Chlamydia: NEGATIVE
Comment: NEGATIVE
Comment: NORMAL
Neisseria Gonorrhea: NEGATIVE

## 2021-01-12 LAB — CULTURE, OB URINE: Culture: NO GROWTH

## 2021-01-13 ENCOUNTER — Encounter: Payer: Self-pay | Admitting: Registered Nurse

## 2021-01-13 ENCOUNTER — Ambulatory Visit (INDEPENDENT_AMBULATORY_CARE_PROVIDER_SITE_OTHER): Payer: Medicaid Other | Admitting: Registered Nurse

## 2021-01-13 ENCOUNTER — Other Ambulatory Visit: Payer: Self-pay

## 2021-01-13 DIAGNOSIS — F411 Generalized anxiety disorder: Secondary | ICD-10-CM | POA: Diagnosis not present

## 2021-01-13 MED ORDER — ALPRAZOLAM 0.5 MG PO TBDP
0.5000 mg | ORAL_TABLET | Freq: Every evening | ORAL | 0 refills | Status: DC | PRN
Start: 2021-01-13 — End: 2021-04-10

## 2021-01-13 NOTE — Patient Instructions (Signed)
° ° ° °  If you have lab work done today you will be contacted with your lab results within the next 2 weeks.  If you have not heard from us then please contact us. The fastest way to get your results is to register for My Chart. ° ° °IF you received an x-ray today, you will receive an invoice from [facility]. Please contact [facility] at [phone] with questions or concerns regarding your invoice.  ° °IF you received labwork today, you will receive an invoice from [facility]. Please contact [facility] at [phone] with questions or concerns regarding your invoice.  ° °Our billing staff will not be able to assist you with questions regarding bills from these companies. ° °You will be contacted with the lab results as soon as they are available. The fastest way to get your results is to activate your My Chart account. Instructions are located on the last page of this paperwork. If you have not heard from us regarding the results in 2 weeks, please contact this office. °  ° ° ° °

## 2021-01-13 NOTE — Progress Notes (Signed)
Established Patient Office Visit  Subjective:  Patient ID: Krystal Humphrey, female    DOB: [birth year]  Age: 35 y.o. MRN: [id number]  CC:  Chief Complaint  Patient presents with  . Hypertension    Patient states she is here for hypertension she went to the hospital BP was 161/109.    HPI Krystal Humphrey presents for htn  Recently found out she is pregnant - plans to terminate on Friday, has appt booked. Was started on labetalol at recent ER visit. Taking with good compliance. No AEs.  BP wnl today No CV symptoms Having a lot of anxiety. Has Ob appt next week  Past Medical History:  Diagnosis Date  . Anxiety   . Depression   . GERD (gastroesophageal reflux disease)   . Hiatal hernia   . History of Clostridium difficile colitis    04/ 2011  . History of duodenal ulcer    2009  . History of esophagitis   . History of panic attacks   . Nephrolithiasis    bilateral per ct 07-27-2016  L > R  (right side nonobstructive)  . Urgency of urination     Past Surgical History:  Procedure Laterality Date  . CYSTOSCOPY WITH RETROGRADE PYELOGRAM, URETEROSCOPY AND STENT PLACEMENT Left 07/30/2016   Procedure: CYSTOSCOPY WITH LEFT  RETROGRADE PYELOGRAM AND LEFT STENT PLACEMENT;  Surgeon: Malen Gauze, MD;  Location: [facility];  Service: Urology;  Laterality: Left;  . CYSTOSCOPY WITH RETROGRADE PYELOGRAM, URETEROSCOPY AND STENT PLACEMENT Left 08/17/2016   Procedure: CYSTOSCOPY WITH LEFT RETROGRADE  STENT EXCHANGE, URETEROSCOPY AND STONE EXTRACTION, LASER LITHOTRIPSY AND LEFT RETROGRADE PYELOGRAM;  Surgeon: Bjorn Pippin, MD;  Location: [facility];  Service: Urology;  Laterality: Left;  . ESOPHAGOGASTRODUODENOSCOPY  last one 03-26-2009  . KNEE ARTHROSCOPY Right 03/19/2010  . TONSILLECTOMY  child    Family History  Problem Relation Age of Onset  . Diabetes Mother   . Thyroid disease Mother   . Heart attack Father   . Diabetes Maternal Aunt   . Diabetes Maternal Uncle   .  COPD Maternal Grandmother   . Crohn's disease Maternal Grandmother   . Anesthesia problems Neg Hx   . Hypotension Neg Hx   . Malignant hyperthermia Neg Hx   . Pseudochol deficiency Neg Hx     Social History   Socioeconomic History  . Marital status: Single    Spouse name: Not on file  . Number of children: 1  . Years of education: 25  . Highest education level: Not on file  Occupational History  . Occupation: unemployed  Tobacco Use  . Smoking status: Former Smoker    Packs/day: 0.25    Years: 2.00    Pack years: 0.50    Quit date: 09/20/2008    Years since quitting: 12.3  . Smokeless tobacco: Never Used  Vaping Use  . Vaping Use: Never used  Substance and Sexual Activity  . Alcohol use: Yes    Comment: occasional  . Drug use: No  . Sexual activity: Yes    Birth control/protection: Implant    Comment: implant placed 09/ 2014  Other Topics Concern  . Not on file  Social History Narrative   Born and raised in [place], [place]. Currently resides in a house with her boyfriend and daughter. 2 dogs. Fun: Softball, bowl   Denies religious beliefs that would effect health care.    Social Determinants of Health   Financial Resource Strain: Not on file  Food Insecurity:  Not on file  Transportation Needs: Not on file  Physical Activity: Not on file  Stress: Not on file  Social Connections: Not on file  Intimate Partner Violence: Not on file    Outpatient Medications Prior to Visit  Medication Sig Dispense Refill  . butalbital-acetaminophen-caffeine (FIORICET) 50-325-40 MG tablet Take 1 tablet by mouth every 6 (six) hours as needed for headache. 20 tablet 0  . cetirizine (ZYRTEC) 10 MG tablet Take 1 tablet (10 mg total) by mouth daily. 30 tablet 11  . cyclobenzaprine (FLEXERIL) 5 MG tablet Take 1 tablet (5 mg total) by mouth 3 (three) times daily as needed for muscle spasms. 30 tablet 1  . famotidine (PEPCID) 20 MG tablet Take 1 tablet (20 mg total) by mouth daily. 10  tablet 0  . fluconazole (DIFLUCAN) 150 MG tablet Take 1 tablet (150 mg total) by mouth daily. Take second dose 72 hours later if symptoms still persists. 2 tablet 0  . HYDROcodone-acetaminophen (NORCO/VICODIN) 5-325 MG tablet Take 1 tablet by mouth every 6 (six) hours as needed for moderate pain. 30 tablet 0  . hydrOXYzine (ATARAX/VISTARIL) 10 MG tablet Take 1 tablet (10 mg total) by mouth 3 (three) times daily as needed. 30 tablet 0  . labetalol (NORMODYNE) 200 MG tablet Take 1 tablet (200 mg total) by mouth 2 (two) times daily. 60 tablet 0  . ondansetron (ZOFRAN ODT) 4 MG disintegrating tablet Take 1 tablet (4 mg total) by mouth every 8 (eight) hours as needed for nausea or vomiting. 20 tablet 0  . sulfamethoxazole-trimethoprim (BACTRIM DS) 800-160 MG tablet Take 1 tablet by mouth 2 (two) times daily. 6 tablet 0  . ALPRAZolam (NIRAVAM) 0.5 MG dissolvable tablet Take 1 tablet (0.5 mg total) by mouth at bedtime as needed for anxiety. 30 tablet 0   No facility-administered medications prior to visit.    Allergies  Allergen Reactions  . Other Anaphylaxis and Itching  . Remeron [Mirtazapine] Anaphylaxis  . Flagyl [Metronidazole] Nausea And Vomiting  . Lexapro [Escitalopram Oxalate]     hallucinations  . Tramadol Itching    ROS Review of Systems  Constitutional: Negative.   HENT: Negative.   Eyes: Negative.   Respiratory: Negative.   Cardiovascular: Negative.   Gastrointestinal: Negative.   Genitourinary: Negative.   Musculoskeletal: Negative.   Skin: Negative.   Neurological: Negative.   Psychiatric/Behavioral: Negative.   All other systems reviewed and are negative.     Objective:    Physical Exam Vitals and nursing note reviewed.  Constitutional:      General: She is not in acute distress.    Appearance: Normal appearance. She is normal weight. She is not ill-appearing, toxic-appearing or diaphoretic.  Cardiovascular:     Rate and Rhythm: Normal rate and regular rhythm.      Heart sounds: Normal heart sounds. No murmur heard. No friction rub. No gallop.   Pulmonary:     Effort: Pulmonary effort is normal. No respiratory distress.     Breath sounds: Normal breath sounds. No stridor. No wheezing, rhonchi or rales.  Chest:     Chest wall: No tenderness.  Skin:    General: Skin is warm and dry.  Neurological:     General: No focal deficit present.     Mental Status: She is alert and oriented to person, place, and time. Mental status is at baseline.  Psychiatric:        Mood and Affect: Mood normal.        Behavior: Behavior  normal.        Thought Content: Thought content normal.        Judgment: Judgment normal.     BP 134/83   Pulse 87   Temp 98 F (36.7 C) (Temporal)   Resp 18   Ht 5\' 8"  (1.727 m)   Wt 232 lb (105.2 kg)   LMP 12/05/2020   SpO2 98%   BMI 35.28 kg/m  Wt Readings from Last 3 Encounters:  01/13/21 232 lb (105.2 kg)  01/10/21 228 lb 8 oz (103.6 kg)  09/23/20 230 lb (104.3 kg)     There are no preventive care reminders to display for this patient.  There are no preventive care reminders to display for this patient.  Lab Results  Component Value Date   TSH 1.77 05/17/2018   Lab Results  Component Value Date   WBC 15.4 (H) 01/10/2021   HGB 13.4 01/10/2021   HCT 39.3 01/10/2021   MCV 91.4 01/10/2021   PLT 357 01/10/2021   Lab Results  Component Value Date   NA 138 01/10/2021   K 3.8 01/10/2021   CO2 25 01/10/2021   GLUCOSE 96 01/10/2021   BUN 7 01/10/2021   CREATININE 0.80 01/10/2021   BILITOT 0.5 01/10/2021   ALKPHOS 50 01/10/2021   AST 31 01/10/2021   ALT 35 01/10/2021   PROT 6.8 01/10/2021   ALBUMIN 4.0 01/10/2021   CALCIUM 9.3 01/10/2021   ANIONGAP 9 01/10/2021   GFR 74.53 05/17/2018   No results found for: CHOL No results found for: HDL No results found for: LDLCALC No results found for: TRIG No results found for: CHOLHDL No results found for: 05/19/2018    Assessment & Plan:   Problem List  Items Addressed This Visit      Other   Generalized anxiety disorder   Relevant Medications   ALPRAZolam (NIRAVAM) 0.5 MG dissolvable tablet      Meds ordered this encounter  Medications  . ALPRAZolam (NIRAVAM) 0.5 MG dissolvable tablet    Sig: Take 1 tablet (0.5 mg total) by mouth at bedtime as needed for anxiety.    Dispense:  30 tablet    Refill:  0    Order Specific Question:   Supervising Provider    Answer:   [id number], JEFFREY R [[id number]]    Follow-up: No follow-ups on file.   PLAN  Refill alprazolam for after surgical termination of pregnancy - pt understands not to take sooner  Counseled on safety and efficacy of surgical termination  Return in 2 weeks to discuss anxiety and depression  Patient encouraged to call clinic with any questions, comments, or concerns.  Neva Seat, NP

## 2021-01-19 ENCOUNTER — Ambulatory Visit: Payer: Medicaid Other

## 2021-01-27 ENCOUNTER — Ambulatory Visit: Payer: Medicaid Other | Admitting: Registered Nurse

## 2021-02-12 ENCOUNTER — Ambulatory Visit: Payer: Medicaid Other | Admitting: Family Medicine

## 2021-04-10 ENCOUNTER — Other Ambulatory Visit: Payer: Self-pay

## 2021-04-10 ENCOUNTER — Ambulatory Visit (INDEPENDENT_AMBULATORY_CARE_PROVIDER_SITE_OTHER): Payer: Medicaid Other | Admitting: Registered Nurse

## 2021-04-10 ENCOUNTER — Encounter: Payer: Self-pay | Admitting: Registered Nurse

## 2021-04-10 VITALS — BP 133/68 | HR 89 | Temp 98.0°F | Resp 18 | Ht 68.0 in | Wt 227.4 lb

## 2021-04-10 DIAGNOSIS — Z6834 Body mass index (BMI) 34.0-34.9, adult: Secondary | ICD-10-CM

## 2021-04-10 DIAGNOSIS — F411 Generalized anxiety disorder: Secondary | ICD-10-CM

## 2021-04-10 MED ORDER — ALPRAZOLAM 0.5 MG PO TBDP: 0.5000 mg | ORAL_TABLET | Freq: Every evening | ORAL | 0 refills | Status: DC | PRN

## 2021-04-10 MED ORDER — PHENTERMINE HCL 15 MG PO CAPS: 15.0000 mg | ORAL_CAPSULE | ORAL | 2 refills | Status: DC

## 2021-04-10 NOTE — Patient Instructions (Signed)
° ° ° °  If you have lab work done today you will be contacted with your lab results within the next 2 weeks.  If you have not heard from us then please contact us. The fastest way to get your results is to register for My Chart. ° ° °IF you received an x-ray today, you will receive an invoice from [facility]. Please contact [facility] at [phone] with questions or concerns regarding your invoice.  ° °IF you received labwork today, you will receive an invoice from [facility]. Please contact [facility] at [phone] with questions or concerns regarding your invoice.  ° °Our billing staff will not be able to assist you with questions regarding bills from these companies. ° °You will be contacted with the lab results as soon as they are available. The fastest way to get your results is to activate your My Chart account. Instructions are located on the last page of this paperwork. If you have not heard from us regarding the results in 2 weeks, please contact this office. °  ° ° ° °

## 2021-04-10 NOTE — Progress Notes (Signed)
Established Patient Office Visit  Subjective:  Patient ID: Krystal Humphrey, female    DOB: 1986/03/14  Age: 35 y.o. MRN: [id number]  CC:  Chief Complaint  Patient presents with  . Follow-up    Patient states she is here for a medication refill and also to discuss weight loss.    HPI [name] presents for medication follow up   Anxiety Managed well with current regimen Improved with new job Stable over past few months  Needs refill on alprazolam  Weight Would like to resume phentermine Had had reservations due to bp, wnl today Has not been taking antihypertensives Has been pursuing better diet and exercise habits  Otherwise no acute concerns  Past Medical History:  Diagnosis Date  . Anxiety   . Depression   . GERD (gastroesophageal reflux disease)   . Hiatal hernia   . History of Clostridium difficile colitis    04/ 2011  . History of duodenal ulcer    2009  . History of esophagitis   . History of panic attacks   . Nephrolithiasis    bilateral per ct 07-27-2016  L > R  (right side nonobstructive)  . Urgency of urination     Past Surgical History:  Procedure Laterality Date  . CYSTOSCOPY WITH RETROGRADE PYELOGRAM, URETEROSCOPY AND STENT PLACEMENT Left 07/30/2016   Procedure: CYSTOSCOPY WITH LEFT  RETROGRADE PYELOGRAM AND LEFT STENT PLACEMENT;  Surgeon: Malen Gauze, MD;  Location: [facility];  Service: Urology;  Laterality: Left;  . CYSTOSCOPY WITH RETROGRADE PYELOGRAM, URETEROSCOPY AND STENT PLACEMENT Left 08/17/2016   Procedure: CYSTOSCOPY WITH LEFT RETROGRADE  STENT EXCHANGE, URETEROSCOPY AND STONE EXTRACTION, LASER LITHOTRIPSY AND LEFT RETROGRADE PYELOGRAM;  Surgeon: Bjorn Pippin, MD;  Location[facility];  Service: Urology;  Laterality: Left;  . ESOPHAGOGASTRODUODENOSCOPY  last one 03-26-2009  . KNEE ARTHROSCOPY Right 03/19/2010  . TONSILLECTOMY  child    Family History  Problem Relation Age of Onset  . Diabetes Mother   . Thyroid  disease Mother   . Heart attack Father   . Diabetes Maternal Aunt   . Diabetes Maternal Uncle   . COPD Maternal Grandmother   . Crohn's disease Maternal Grandmother   . Anesthesia problems Neg Hx   . Hypotension Neg Hx   . Malignant hyperthermia Neg Hx   . Pseudochol deficiency Neg Hx     Social History   Socioeconomic History  . Marital status: Single    Spouse name: Not on file  . Number of children: 1  . Years of education: 10  . Highest education level: Not on file  Occupational History  . Occupation: unemployed  Tobacco Use  . Smoking status: Former Smoker    Packs/day: 0.25    Years: 2.00    Pack years: 0.50    Quit date: 09/20/2008    Years since quitting: 12.5  . Smokeless tobacco: Never Used  Vaping Use  . Vaping Use: Never used  Substance and Sexual Activity  . Alcohol use: Yes    Comment: occasional  . Drug use: No  . Sexual activity: Yes    Birth control/protection: Implant    Comment: implant placed 09/ 2014  Other Topics Concern  . Not on file  Social History Narrative   Born and raised in [place], [place]. Currently resides in a house with her boyfriend and daughter. 2 dogs. Fun: Softball, bowl   Denies religious beliefs that would effect health care.    Social Determinants of Health  Financial Resource Strain: Not on file  Food Insecurity: Not on file  Transportation Needs: Not on file  Physical Activity: Not on file  Stress: Not on file  Social Connections: Not on file  Intimate Partner Violence: Not on file    Outpatient Medications Prior to Visit  Medication Sig Dispense Refill  . butalbital-acetaminophen-caffeine (FIORICET) 50-325-40 MG tablet Take 1 tablet by mouth every 6 (six) hours as needed for headache. 20 tablet 0  . cetirizine (ZYRTEC) 10 MG tablet Take 1 tablet (10 mg total) by mouth daily. 30 tablet 11  . cyclobenzaprine (FLEXERIL) 5 MG tablet Take 1 tablet (5 mg total) by mouth 3 (three) times daily as needed for muscle spasms.  30 tablet 1  . famotidine (PEPCID) 20 MG tablet Take 1 tablet (20 mg total) by mouth daily. 10 tablet 0  . fluconazole (DIFLUCAN) 150 MG tablet Take 1 tablet (150 mg total) by mouth daily. Take second dose 72 hours later if symptoms still persists. 2 tablet 0  . hydrOXYzine (ATARAX/VISTARIL) 10 MG tablet Take 1 tablet (10 mg total) by mouth 3 (three) times daily as needed. 30 tablet 0  . sulfamethoxazole-trimethoprim (BACTRIM DS) 800-160 MG tablet Take 1 tablet by mouth 2 (two) times daily. 6 tablet 0  . ALPRAZolam (NIRAVAM) 0.5 MG dissolvable tablet Take 1 tablet (0.5 mg total) by mouth at bedtime as needed for anxiety. 30 tablet 0  . HYDROcodone-acetaminophen (NORCO/VICODIN) 5-325 MG tablet Take 1 tablet by mouth every 6 (six) hours as needed for moderate pain. (Patient not taking: Reported on 04/10/2021) 30 tablet 0  . labetalol (NORMODYNE) 200 MG tablet Take 1 tablet (200 mg total) by mouth 2 (two) times daily. 60 tablet 0  . ondansetron (ZOFRAN ODT) 4 MG disintegrating tablet Take 1 tablet (4 mg total) by mouth every 8 (eight) hours as needed for nausea or vomiting. (Patient not taking: Reported on 04/10/2021) 20 tablet 0   No facility-administered medications prior to visit.    Allergies  Allergen Reactions  . Other Anaphylaxis and Itching  . Remeron [Mirtazapine] Anaphylaxis  . Flagyl [Metronidazole] Nausea And Vomiting  . Lexapro [Escitalopram Oxalate]     hallucinations  . Tramadol Itching    ROS Review of Systems  Constitutional: Negative.   HENT: Negative.   Eyes: Negative.   Respiratory: Negative.   Cardiovascular: Negative.   Gastrointestinal: Negative.   Genitourinary: Negative.   Musculoskeletal: Negative.   Skin: Negative.   Neurological: Negative.   Psychiatric/Behavioral: Negative.   All other systems reviewed and are negative.     Objective:    Physical Exam Vitals and nursing note reviewed.  Constitutional:      General: She is not in acute distress.     Appearance: Normal appearance. She is normal weight. She is not ill-appearing, toxic-appearing or diaphoretic.  Cardiovascular:     Rate and Rhythm: Normal rate and regular rhythm.     Heart sounds: Normal heart sounds. No murmur heard. No friction rub. No gallop.   Pulmonary:     Effort: Pulmonary effort is normal. No respiratory distress.     Breath sounds: Normal breath sounds. No stridor. No wheezing, rhonchi or rales.  Chest:     Chest wall: No tenderness.  Skin:    General: Skin is warm and dry.  Neurological:     General: No focal deficit present.     Mental Status: She is alert and oriented to person, place, and time. Mental status is at baseline.  Psychiatric:  Mood and Affect: Mood normal.        Behavior: Behavior normal.        Thought Content: Thought content normal.        Judgment: Judgment normal.     BP 133/68   Pulse 89   Temp 98 F (36.7 C) (Temporal)   Resp 18   Ht 5\' 8"  (1.727 m)   Wt 227 lb 6.4 oz (103.1 kg)   LMP 12/05/2020   SpO2 99%   BMI 34.58 kg/m  Wt Readings from Last 3 Encounters:  04/10/21 227 lb 6.4 oz (103.1 kg)  01/13/21 232 lb (105.2 kg)  01/10/21 228 lb 8 oz (103.6 kg)     There are no preventive care reminders to display for this patient.  There are no preventive care reminders to display for this patient.  Lab Results  Component Value Date   TSH 1.77 05/17/2018   Lab Results  Component Value Date   WBC 15.4 (H) 01/10/2021   HGB 13.4 01/10/2021   HCT 39.3 01/10/2021   MCV 91.4 01/10/2021   PLT 357 01/10/2021   Lab Results  Component Value Date   NA 138 01/10/2021   K 3.8 01/10/2021   CO2 25 01/10/2021   GLUCOSE 96 01/10/2021   BUN 7 01/10/2021   CREATININE 0.80 01/10/2021   BILITOT 0.5 01/10/2021   ALKPHOS 50 01/10/2021   AST 31 01/10/2021   ALT 35 01/10/2021   PROT 6.8 01/10/2021   ALBUMIN 4.0 01/10/2021   CALCIUM 9.3 01/10/2021   ANIONGAP 9 01/10/2021   GFR 74.53 05/17/2018   No results found for:  CHOL No results found for: HDL No results found for: LDLCALC No results found for: TRIG No results found for: CHOLHDL No results found for: 05/19/2018    Assessment & Plan:   Problem List Items Addressed This Visit      Other   Generalized anxiety disorder   Relevant Medications   ALPRAZolam (NIRAVAM) 0.5 MG dissolvable tablet    Other Visit Diagnoses    Body mass index (BMI) 34.0-34.9, adult    -  Primary   Relevant Medications   phentermine 15 MG capsule      Meds ordered this encounter  Medications  . ALPRAZolam (NIRAVAM) 0.5 MG dissolvable tablet    Sig: Take 1 tablet (0.5 mg total) by mouth at bedtime as needed for anxiety.    Dispense:  30 tablet    Refill:  0    Order Specific Question:   Supervising Provider    Answer:   12-18-1972, JEFFREY R [[id number]]  . phentermine 15 MG capsule    Sig: Take 1 capsule (15 mg total) by mouth every morning.    Dispense:  30 capsule    Refill:  2    Order Specific Question:   Supervising Provider    Answer:   Neva Seat, JEFFREY R [[id number]]    Follow-up: No follow-ups on file.   PLAN  Refill alprazolam. Reviewed r/b/se of benzodiazepines  Restart phentermine 15mg  PO qd. Reviewed r/b/se of this medication. Short term use - will give 3 months but then will need break  Patient encouraged to call clinic with any questions, comments, or concerns.  Neva Seat, NP

## 2021-05-19 ENCOUNTER — Other Ambulatory Visit: Payer: Self-pay

## 2021-05-19 ENCOUNTER — Telehealth: Payer: Self-pay | Admitting: Registered Nurse

## 2021-05-19 DIAGNOSIS — F411 Generalized anxiety disorder: Secondary | ICD-10-CM

## 2021-05-19 MED ORDER — ALPRAZOLAM 0.5 MG PO TBDP: 0.5000 mg | ORAL_TABLET | Freq: Every evening | ORAL | 0 refills | Status: DC | PRN

## 2021-05-19 NOTE — Telephone Encounter (Signed)
LFD 04/10/21 #30 with no refills LOV 04/10/21 NOV 07/13/21

## 2021-05-19 NOTE — Telephone Encounter (Signed)
..  Medication Refills  Last OV:  Medication:  Generic for Xanax  Pharmacy:  [facility] on [address] in [place]  Let patient know to contact pharmacy at the end of the day to make sure medication is ready.   Please notify patient to allow 48-72 hours to process.  Encourage patient to contact the pharmacy for refills or they can request refills through [facility]  Clinical Fills out below:   Last refill:  QTY:  Refill Date:    Other Comments:   Okay for refill?  Please advise.

## 2021-05-19 NOTE — Telephone Encounter (Signed)
Sent to pcp for approval 

## 2021-05-21 ENCOUNTER — Ambulatory Visit (INDEPENDENT_AMBULATORY_CARE_PROVIDER_SITE_OTHER): Payer: Medicaid Other | Admitting: Registered Nurse

## 2021-05-21 ENCOUNTER — Other Ambulatory Visit: Payer: Self-pay

## 2021-05-21 ENCOUNTER — Encounter: Payer: Self-pay | Admitting: Registered Nurse

## 2021-05-21 VITALS — BP 138/78 | HR 79 | Temp 98.3°F | Resp 18 | Ht 68.0 in | Wt 227.6 lb

## 2021-05-21 DIAGNOSIS — D72829 Elevated white blood cell count, unspecified: Secondary | ICD-10-CM

## 2021-05-21 DIAGNOSIS — R110 Nausea: Secondary | ICD-10-CM | POA: Diagnosis not present

## 2021-05-21 LAB — CBC WITH DIFFERENTIAL/PLATELET
Basophils Absolute: 0.1 K/uL (ref 0.0–0.1)
Basophils Relative: 0.9 % (ref 0.0–3.0)
Eosinophils Absolute: 0.2 K/uL (ref 0.0–0.7)
Eosinophils Relative: 1.8 % (ref 0.0–5.0)
HCT: 40.4 % (ref 36.0–46.0)
Hemoglobin: 14 g/dL (ref 12.0–15.0)
Lymphocytes Relative: 31.2 % (ref 12.0–46.0)
Lymphs Abs: 3.3 K/uL (ref 0.7–4.0)
MCHC: 34.7 g/dL (ref 30.0–36.0)
MCV: 89.5 fl (ref 78.0–100.0)
Monocytes Absolute: 0.8 K/uL (ref 0.1–1.0)
Monocytes Relative: 7.7 % (ref 3.0–12.0)
Neutro Abs: 6.1 K/uL (ref 1.4–7.7)
Neutrophils Relative %: 58.4 % (ref 43.0–77.0)
Platelets: 303 K/uL (ref 150.0–400.0)
RBC: 4.52 Mil/uL (ref 3.87–5.11)
RDW: 12.9 % (ref 11.5–15.5)
WBC: 10.5 K/uL (ref 4.0–10.5)

## 2021-05-21 LAB — HEMOGLOBIN A1C: Hgb A1c MFr Bld: 5.2 % (ref 4.6–6.5)

## 2021-05-21 LAB — COMPREHENSIVE METABOLIC PANEL
ALT: 40 U/L — ABNORMAL HIGH (ref 0–35)
AST: 20 U/L (ref 0–37)
Albumin: 4.3 g/dL (ref 3.5–5.2)
Alkaline Phosphatase: 57 U/L (ref 39–117)
BUN: 9 mg/dL (ref 6–23)
CO2: 26 mEq/L (ref 19–32)
Calcium: 9.5 mg/dL (ref 8.4–10.5)
Chloride: 101 mEq/L (ref 96–112)
Creatinine, Ser: 0.72 mg/dL (ref 0.40–1.20)
GFR: 110.5 mL/min (ref >60.00)
Glucose, Bld: 74 mg/dL (ref 70–99)
Potassium: 3.8 mEq/L (ref 3.5–5.1)
Sodium: 137 mEq/L (ref 135–145)
Total Bilirubin: 0.5 mg/dL (ref 0.2–1.2)
Total Protein: 7.1 g/dL (ref 6.0–8.3)

## 2021-05-21 LAB — TSH: TSH: 2.41 u[IU]/mL (ref 0.35–4.50)

## 2021-05-21 LAB — POCT URINE PREGNANCY: Preg Test, Ur: NEGATIVE

## 2021-05-21 MED ORDER — ONDANSETRON HCL 4 MG PO TABS: 4.0000 mg | ORAL_TABLET | Freq: Three times a day (TID) | ORAL | 0 refills | Status: DC | PRN

## 2021-05-21 MED ORDER — PANTOPRAZOLE SODIUM 40 MG PO TBEC: 40.0000 mg | DELAYED_RELEASE_TABLET | Freq: Every day | ORAL | 3 refills | Status: DC

## 2021-05-21 MED ORDER — SUCRALFATE 1 G PO TABS: 1.0000 g | ORAL_TABLET | Freq: Three times a day (TID) | ORAL | 0 refills | Status: DC

## 2021-05-21 NOTE — Patient Instructions (Addendum)
Ms Krystal Humphrey to see you - sorry you're not feeling well.  Pregnancy test is negative on site today. Will send blood test as well.  Also looking at blood counts, sugars, liver and kidney function, electrolytes, and thyroid.  If there are concerns I'll let you know  If symptoms change, let me know. In particular: Loss of consciousness Vomiting blood / coffee ground appearance to vomit / vomiting that won't stop Diarrhea that doesn't stop, blood in stool, or dark, tarry stool Abnormal menses Severe abdominal pain Inability to use the restroom - unable to pass stool or urine.  Taking pantoprazole and sucralfate daily for the next ten days can help. After that use pantoprazole as needed. I've sent a few tabs of zofran as well to use as needed.  I'll be in touch soon  Thank you  Rich     If you have lab work done today you will be contacted with your lab results within the next 2 weeks.  If you have not heard from [place] then please contact us. The fastest way to get your results is to register for My Chart.   IF you received an x-ray today, you will receive an invoice from [facility]. Please contact [facility] at [phone] with questions or concerns regarding your invoice.   IF you received labwork today, you will receive an invoice from [facility]. Please contact [facility] at [phone] with questions or concerns regarding your invoice.   Our billing staff will not be able to assist you with questions regarding bills from these companies.  You will be contacted with the lab results as soon as they are available. The fastest way to get your results is to activate your My Chart account. Instructions are located on the last page of this paperwork. If you have not heard from [place] regarding the results in 2 weeks, please contact this office.

## 2021-05-21 NOTE — Progress Notes (Signed)
Acute Office Visit  Subjective:    Patient ID: Krystal Humphrey, female    DOB: 12/23/1985, 35 y.o.   MRN: [id number]  Chief Complaint  Patient presents with   Nausea    Patient states she has been nauseous for about 1 week and feels tired and also have some headaches. Patient has not started her monthly period this month yet.    HPI Patient is in today for nausea  Onset over the past week mostly in morning Along with some fatigue and headaches  Hx of irregular menses. Due to start menses upcoming Saturday Lmp started on May 29 and lasted 4 days.  Vomiting is bilious. No hematemesis.  Notes a little more nausea now than previously.  No diarrhea,. No changes to diet or medications. No hx of vomiting syndrome such as cyclic vomiting. Denies marijuana use.   Past Medical History:  Diagnosis Date   Anxiety    Depression    GERD (gastroesophageal reflux disease)    Hiatal hernia    History of Clostridium difficile colitis    04/ 2011   History of duodenal ulcer    2009   History of esophagitis    History of panic attacks    Nephrolithiasis    bilateral per ct 07-27-2016  L > R  (right side nonobstructive)   Urgency of urination     Past Surgical History:  Procedure Laterality Date   CYSTOSCOPY WITH RETROGRADE PYELOGRAM, URETEROSCOPY AND STENT PLACEMENT Left 07/30/2016   Procedure: CYSTOSCOPY WITH LEFT  RETROGRADE PYELOGRAM AND LEFT STENT PLACEMENT;  Surgeon: Malen Gauze, MD;  Location: [facility];  Service: Urology;  Laterality: Left;   CYSTOSCOPY WITH RETROGRADE PYELOGRAM, URETEROSCOPY AND STENT PLACEMENT Left 08/17/2016   Procedure: CYSTOSCOPY WITH LEFT RETROGRADE  STENT EXCHANGE, URETEROSCOPY AND STONE EXTRACTION, LASER LITHOTRIPSY AND LEFT RETROGRADE PYELOGRAM;  Surgeon: Bjorn Pippin, MD;  Location: [facility];  Service: Urology;  Laterality: Left;   ESOPHAGOGASTRODUODENOSCOPY  last one 03-26-2009   KNEE ARTHROSCOPY Right 03/19/2010   TONSILLECTOMY   child    Family History  Problem Relation Age of Onset   Diabetes Mother    Thyroid disease Mother    Heart attack Father    Diabetes Maternal Aunt    Diabetes Maternal Uncle    COPD Maternal Grandmother    Crohn's disease Maternal Grandmother    Anesthesia problems Neg Hx    Hypotension Neg Hx    Malignant hyperthermia Neg Hx    Pseudochol deficiency Neg Hx     Social History   Socioeconomic History   Marital status: Single    Spouse name: Not on file   Number of children: 1   Years of education: 12   Highest education level: Not on file  Occupational History   Occupation: unemployed  Tobacco Use   Smoking status: Former    Packs/day: 0.25    Years: 2.00    Pack years: 0.50    Types: Cigarettes    Quit date: 09/20/2008    Years since quitting: 12.6   Smokeless tobacco: Never  Vaping Use   Vaping Use: Never used  Substance and Sexual Activity   Alcohol use: Yes    Comment: occasional   Drug use: No   Sexual activity: Yes    Birth control/protection: Implant    Comment: implant placed 09/ 2014  Other Topics Concern   Not on file  Social History Narrative   Born and raised in [place], [place]. Currently resides  in a house with her boyfriend and daughter. 2 dogs. Fun: Softball, bowl   Denies religious beliefs that would effect health care.    Social Determinants of Health   Financial Resource Strain: Not on file  Food Insecurity: Not on file  Transportation Needs: Not on file  Physical Activity: Not on file  Stress: Not on file  Social Connections: Not on file  Intimate Partner Violence: Not on file    Outpatient Medications Prior to Visit  Medication Sig Dispense Refill   ALPRAZolam (NIRAVAM) 0.5 MG dissolvable tablet Take 1 tablet (0.5 mg total) by mouth at bedtime as needed for anxiety. 30 tablet 0   cetirizine (ZYRTEC) 10 MG tablet Take 1 tablet (10 mg total) by mouth daily. 30 tablet 11   cyclobenzaprine (FLEXERIL) 5 MG tablet Take 1 tablet (5 mg  total) by mouth 3 (three) times daily as needed for muscle spasms. 30 tablet 1   famotidine (PEPCID) 20 MG tablet Take 1 tablet (20 mg total) by mouth daily. 10 tablet 0   fluconazole (DIFLUCAN) 150 MG tablet Take 1 tablet (150 mg total) by mouth daily. Take second dose 72 hours later if symptoms still persists. 2 tablet 0   hydrOXYzine (ATARAX/VISTARIL) 10 MG tablet Take 1 tablet (10 mg total) by mouth 3 (three) times daily as needed. 30 tablet 0   phentermine 15 MG capsule Take 1 capsule (15 mg total) by mouth every morning. 30 capsule 2   sulfamethoxazole-trimethoprim (BACTRIM DS) 800-160 MG tablet Take 1 tablet by mouth 2 (two) times daily. 6 tablet 0   HYDROcodone-acetaminophen (NORCO/VICODIN) 5-325 MG tablet Take 1 tablet by mouth every 6 (six) hours as needed for moderate pain. (Patient not taking: No sig reported) 30 tablet 0   labetalol (NORMODYNE) 200 MG tablet Take 1 tablet (200 mg total) by mouth 2 (two) times daily. 60 tablet 0   ondansetron (ZOFRAN ODT) 4 MG disintegrating tablet Take 1 tablet (4 mg total) by mouth every 8 (eight) hours as needed for nausea or vomiting. (Patient not taking: No sig reported) 20 tablet 0   No facility-administered medications prior to visit.    Allergies  Allergen Reactions   Other Anaphylaxis and Itching   Remeron [Mirtazapine] Anaphylaxis   Flagyl [Metronidazole] Nausea And Vomiting   Lexapro [Escitalopram Oxalate]     hallucinations   Tramadol Itching    Review of Systems  Constitutional:  Positive for fatigue.  HENT: Negative.    Eyes: Negative.   Respiratory: Negative.    Cardiovascular: Negative.   Gastrointestinal:  Positive for nausea. Negative for abdominal distention, abdominal pain, anal bleeding, blood in stool, constipation, diarrhea, rectal pain and vomiting.  Genitourinary: Negative.   Musculoskeletal: Negative.   Skin: Negative.   Neurological: Negative.   Psychiatric/Behavioral: Negative.    All other systems reviewed  and are negative.     Objective:    Physical Exam  BP 138/78   Pulse 79   Temp 98.3 F (36.8 C) (Temporal)   Resp 18   Ht 5\' 8"  (1.727 m)   Wt 227 lb 9.6 oz (103.2 kg)   LMP 12/05/2020   SpO2 99%   BMI 34.61 kg/m  Wt Readings from Last 3 Encounters:  05/21/21 227 lb 9.6 oz (103.2 kg)  04/10/21 227 lb 6.4 oz (103.1 kg)  01/13/21 232 lb (105.2 kg)    There are no preventive care reminders to display for this patient.  There are no preventive care reminders to display for this  patient.   Lab Results  Component Value Date   TSH 1.77 05/17/2018   Lab Results  Component Value Date   WBC 15.4 (H) 01/10/2021   HGB 13.4 01/10/2021   HCT 39.3 01/10/2021   MCV 91.4 01/10/2021   PLT 357 01/10/2021   Lab Results  Component Value Date   NA 138 01/10/2021   K 3.8 01/10/2021   CO2 25 01/10/2021   GLUCOSE 96 01/10/2021   BUN 7 01/10/2021   CREATININE 0.80 01/10/2021   BILITOT 0.5 01/10/2021   ALKPHOS 50 01/10/2021   AST 31 01/10/2021   ALT 35 01/10/2021   PROT 6.8 01/10/2021   ALBUMIN 4.0 01/10/2021   CALCIUM 9.3 01/10/2021   ANIONGAP 9 01/10/2021   GFR 74.53 05/17/2018   No results found for: CHOL No results found for: HDL No results found for: LDLCALC No results found for: TRIG No results found for: CHOLHDL No results found for: DJME2A     Assessment & Plan:   Problem List Items Addressed This Visit   None Visit Diagnoses     Nausea without vomiting    -  Primary   Relevant Medications   pantoprazole (PROTONIX) 40 MG tablet   sucralfate (CARAFATE) 1 g tablet   ondansetron (ZOFRAN) 4 MG tablet   Other Relevant Orders   POCT urine pregnancy (Completed)   CBC with Differential/Platelet   Hemoglobin A1c   Comprehensive metabolic panel   TSH   Pathologist smear review   Beta hCG quant (ref lab)   Leukocytosis, unspecified type       Relevant Orders   CBC with Differential/Platelet   Hemoglobin A1c   Comprehensive metabolic panel   TSH    Pathologist smear review        Meds ordered this encounter  Medications   pantoprazole (PROTONIX) 40 MG tablet    Sig: Take 1 tablet (40 mg total) by mouth daily.    Dispense:  30 tablet    Refill:  3    Order Specific Question:   Supervising Provider    Answer:   Neva Seat, JEFFREY R [[id number]]   sucralfate (CARAFATE) 1 g tablet    Sig: Take 1 tablet (1 g total) by mouth 4 (four) times daily -  with meals and at bedtime.    Dispense:  40 tablet    Refill:  0    Order Specific Question:   Supervising Provider    Answer:   Neva Seat, JEFFREY R [[id number]]   ondansetron (ZOFRAN) 4 MG tablet    Sig: Take 1 tablet (4 mg total) by mouth every 8 (eight) hours as needed for nausea or vomiting.    Dispense:  20 tablet    Refill:  0    Order Specific Question:   Supervising Provider    Answer:   Neva Seat, JEFFREY R [[id number]]   PLAN Negative poct urine preg test in office today. Suspect gastritis vs. Dehydration. Will give pantoprazole and sucralfate, suggest increasing hydration.  Given return precautions and ER precautions Labs collected. Will follow up with the patient as warranted. Patient encouraged to call clinic with any questions, comments, or concerns.  Janeece Agee, NP

## 2021-05-22 LAB — BETA HCG QUANT (REF LAB): hCG Quant: <1 m[IU]/mL

## 2021-05-22 LAB — PATHOLOGIST SMEAR REVIEW

## 2021-06-17 ENCOUNTER — Other Ambulatory Visit: Payer: Self-pay | Admitting: Registered Nurse

## 2021-06-17 DIAGNOSIS — F411 Generalized anxiety disorder: Secondary | ICD-10-CM

## 2021-06-17 MED ORDER — ALPRAZOLAM 0.5 MG PO TBDP
0.5000 mg | ORAL_TABLET | Freq: Every evening | ORAL | 0 refills | Status: DC | PRN
Start: 2021-06-17 — End: 2021-07-20

## 2021-06-17 NOTE — Telephone Encounter (Signed)
Last filled 05/19/21 #30 with no refills Last office visit 05/21/21 Next visit 07/13/21

## 2021-07-13 ENCOUNTER — Ambulatory Visit: Payer: Medicaid Other | Admitting: Registered Nurse

## 2021-07-20 ENCOUNTER — Other Ambulatory Visit: Payer: Self-pay

## 2021-07-20 ENCOUNTER — Telehealth: Payer: Self-pay

## 2021-07-20 DIAGNOSIS — F411 Generalized anxiety disorder: Secondary | ICD-10-CM

## 2021-07-20 MED ORDER — ALPRAZOLAM 0.5 MG PO TBDP: 0.5000 mg | ORAL_TABLET | Freq: Every evening | ORAL | 0 refills | Status: DC | PRN

## 2021-07-20 NOTE — Telephone Encounter (Signed)
Pt needs refill on ALPRAZolam (NIRAVAM) 0.5 MG dissolvable tablet   [id number] - [place] ([place]), [place] - [address]   Pt call back [phone]  No future appt Past appt 06/22

## 2021-07-20 NOTE — Telephone Encounter (Signed)
Pt needs refill on ALPRAZolam (NIRAVAM) 0.5 MG dissolvable tablet    [id number] - [place] ([place]), [place] - [address]    Pt call back [phone]  FD 06/17/21 #30 with no refills LOV 05/21/21 NOV none

## 2021-07-20 NOTE — Telephone Encounter (Signed)
Request sent to provider for approval.  

## 2021-07-31 ENCOUNTER — Ambulatory Visit (INDEPENDENT_AMBULATORY_CARE_PROVIDER_SITE_OTHER): Payer: Commercial Managed Care - PPO | Admitting: Registered Nurse

## 2021-07-31 ENCOUNTER — Other Ambulatory Visit: Payer: Self-pay

## 2021-07-31 VITALS — BP 116/70 | HR 86 | Temp 98.2°F | Resp 16 | Ht 68.0 in | Wt 222.8 lb

## 2021-07-31 DIAGNOSIS — N926 Irregular menstruation, unspecified: Secondary | ICD-10-CM | POA: Diagnosis not present

## 2021-07-31 LAB — POCT URINE PREGNANCY: Preg Test, Ur: NEGATIVE

## 2021-08-17 DIAGNOSIS — M79646 Pain in unspecified finger(s): Secondary | ICD-10-CM | POA: Insufficient documentation

## 2021-08-18 ENCOUNTER — Other Ambulatory Visit: Payer: Self-pay | Admitting: Registered Nurse

## 2021-08-18 DIAGNOSIS — F411 Generalized anxiety disorder: Secondary | ICD-10-CM

## 2021-08-19 NOTE — Telephone Encounter (Signed)
Requesting:Niravam 0.5mg  Contract: UDS: Last Visit:07/31/21 Next Visit:n/a Last Refill:07/20/21 30 tabs 0 refills  Please Advise

## 2021-08-20 ENCOUNTER — Encounter: Payer: Self-pay | Admitting: Registered Nurse

## 2021-08-20 MED ORDER — ALPRAZOLAM 0.5 MG PO TBDP
0.5000 mg | ORAL_TABLET | Freq: Every evening | ORAL | 0 refills | Status: DC | PRN
Start: 2021-08-20 — End: 2021-09-25

## 2021-08-20 NOTE — Telephone Encounter (Signed)
Pt called back with follow up on her medication

## 2021-08-21 NOTE — Progress Notes (Signed)
Established Patient Office Visit  Subjective:  Patient ID: Krystal Humphrey, female    DOB: Mar 09, 1986  Age: 35 y.o. MRN: [id number]  CC:  Chief Complaint  Patient presents with   Late Period    Pt reports 7 days late for cycle absence of normal pre menstrual symptoms cramps, reports some nausea worst in the mornings, reports change in appetite     HPI Krystal Humphrey presents for missed menses  7 days late on cycle No usual premenstrual symptoms. Has been under a lot more stress lately Has noticed nausea without vomiting worse in AM. Usually very regular cycle.  Currently sexually active, reports [facility] within past month.  Past Medical History:  Diagnosis Date   Anxiety    Depression    GERD (gastroesophageal reflux disease)    Hiatal hernia    History of Clostridium difficile colitis    04/ 2011   History of duodenal ulcer    2009   History of esophagitis    History of panic attacks    Nephrolithiasis    bilateral per ct 07-27-2016  L > R  (right side nonobstructive)   Urgency of urination     Past Surgical History:  Procedure Laterality Date   CYSTOSCOPY WITH RETROGRADE PYELOGRAM, URETEROSCOPY AND STENT PLACEMENT Left 07/30/2016   Procedure: CYSTOSCOPY WITH LEFT  RETROGRADE PYELOGRAM AND LEFT STENT PLACEMENT;  Surgeon: Malen Gauze, MD;  Location: [facility];  Service: Urology;  Laterality: Left;   CYSTOSCOPY WITH RETROGRADE PYELOGRAM, URETEROSCOPY AND STENT PLACEMENT Left 08/17/2016   Procedure: CYSTOSCOPY WITH LEFT RETROGRADE  STENT EXCHANGE, URETEROSCOPY AND STONE EXTRACTION, LASER LITHOTRIPSY AND LEFT RETROGRADE PYELOGRAM;  Surgeon: Bjorn Pippin, MD;  Location: [facility] ;  Service: Urology;  Laterality: Left;   ESOPHAGOGASTRODUODENOSCOPY  last one 03-26-2009   KNEE ARTHROSCOPY Right 03/19/2010   TONSILLECTOMY  child    Family History  Problem Relation Age of Onset   Diabetes Mother    Thyroid disease Mother    Heart attack Father    Diabetes  Maternal Aunt    Diabetes Maternal Uncle    COPD Maternal Grandmother    Crohn's disease Maternal Grandmother    Anesthesia problems Neg Hx    Hypotension Neg Hx    Malignant hyperthermia Neg Hx    Pseudochol deficiency Neg Hx     Social History   Socioeconomic History   Marital status: Single    Spouse name: Not on file   Number of children: 1   Years of education: 12   Highest education level: Not on file  Occupational History   Occupation: unemployed  Tobacco Use   Smoking status: Former    Packs/day: 0.25    Years: 2.00    Pack years: 0.50    Types: Cigarettes    Quit date: 09/20/2008    Years since quitting: 12.9   Smokeless tobacco: Never  Vaping Use   Vaping Use: Never used  Substance and Sexual Activity   Alcohol use: Yes    Comment: occasional   Drug use: No   Sexual activity: Yes    Birth control/protection: Implant    Comment: implant placed 09/ 2014  Other Topics Concern   Not on file  Social History Narrative   Born and raised in [place], [place]. Currently resides in a house with her boyfriend and daughter. 2 dogs. Fun: Softball, bowl   Denies religious beliefs that would effect health care.    Social Determinants of Health   Financial  Resource Strain: Not on file  Food Insecurity: Not on file  Transportation Needs: Not on file  Physical Activity: Not on file  Stress: Not on file  Social Connections: Not on file  Intimate Partner Violence: Not on file    Outpatient Medications Prior to Visit  Medication Sig Dispense Refill   ALPRAZolam (NIRAVAM) 0.5 MG dissolvable tablet Take 1 tablet (0.5 mg total) by mouth at bedtime as needed for anxiety. 30 tablet 0   cetirizine (ZYRTEC) 10 MG tablet Take 1 tablet (10 mg total) by mouth daily. (Patient not taking: Reported on 07/31/2021) 30 tablet 11   cyclobenzaprine (FLEXERIL) 5 MG tablet Take 1 tablet (5 mg total) by mouth 3 (three) times daily as needed for muscle spasms. (Patient not taking: Reported on  07/31/2021) 30 tablet 1   famotidine (PEPCID) 20 MG tablet Take 1 tablet (20 mg total) by mouth daily. (Patient not taking: Reported on 07/31/2021) 10 tablet 0   fluconazole (DIFLUCAN) 150 MG tablet Take 1 tablet (150 mg total) by mouth daily. Take second dose 72 hours later if symptoms still persists. (Patient not taking: Reported on 07/31/2021) 2 tablet 0   HYDROcodone-acetaminophen (NORCO/VICODIN) 5-325 MG tablet Take 1 tablet by mouth every 6 (six) hours as needed for moderate pain. (Patient not taking: No sig reported) 30 tablet 0   hydrOXYzine (ATARAX/VISTARIL) 10 MG tablet Take 1 tablet (10 mg total) by mouth 3 (three) times daily as needed. (Patient not taking: Reported on 07/31/2021) 30 tablet 0   labetalol (NORMODYNE) 200 MG tablet Take 1 tablet (200 mg total) by mouth 2 (two) times daily. 60 tablet 0   ondansetron (ZOFRAN ODT) 4 MG disintegrating tablet Take 1 tablet (4 mg total) by mouth every 8 (eight) hours as needed for nausea or vomiting. (Patient not taking: No sig reported) 20 tablet 0   ondansetron (ZOFRAN) 4 MG tablet Take 1 tablet (4 mg total) by mouth every 8 (eight) hours as needed for nausea or vomiting. (Patient not taking: Reported on 07/31/2021) 20 tablet 0   pantoprazole (PROTONIX) 40 MG tablet Take 1 tablet (40 mg total) by mouth daily. (Patient not taking: Reported on 07/31/2021) 30 tablet 3   phentermine 15 MG capsule Take 1 capsule (15 mg total) by mouth every morning. (Patient not taking: Reported on 07/31/2021) 30 capsule 2   sucralfate (CARAFATE) 1 g tablet Take 1 tablet (1 g total) by mouth 4 (four) times daily -  with meals and at bedtime. (Patient not taking: Reported on 07/31/2021) 40 tablet 0   sulfamethoxazole-trimethoprim (BACTRIM DS) 800-160 MG tablet Take 1 tablet by mouth 2 (two) times daily. (Patient not taking: Reported on 07/31/2021) 6 tablet 0   No facility-administered medications prior to visit.    Allergies  Allergen Reactions   Other Anaphylaxis and Itching    Remeron [Mirtazapine] Anaphylaxis   Flagyl [Metronidazole] Nausea And Vomiting   Lexapro [Escitalopram Oxalate]     hallucinations   Tramadol Itching    ROS Review of Systems Per hpi     Objective:    Physical Exam Vitals and nursing note reviewed.  Constitutional:      General: She is not in acute distress.    Appearance: Normal appearance. She is normal weight. She is not ill-appearing, toxic-appearing or diaphoretic.  Cardiovascular:     Rate and Rhythm: Normal rate and regular rhythm.     Heart sounds: Normal heart sounds. No murmur heard.   No friction rub. No gallop.  Pulmonary:  Effort: Pulmonary effort is normal. No respiratory distress.     Breath sounds: Normal breath sounds. No stridor. No wheezing, rhonchi or rales.  Chest:     Chest wall: No tenderness.  Skin:    General: Skin is warm and dry.  Neurological:     General: No focal deficit present.     Mental Status: She is alert and oriented to person, place, and time. Mental status is at baseline.  Psychiatric:        Mood and Affect: Mood normal.        Behavior: Behavior normal.        Thought Content: Thought content normal.        Judgment: Judgment normal.    BP 116/70   Pulse 86   Temp 98.2 F (36.8 C) (Temporal)   Resp 16   Ht 5\' 8"  (1.727 m)   Wt 222 lb 12.8 oz (101.1 kg)   SpO2 100%   BMI 33.88 kg/m  Wt Readings from Last 3 Encounters:  07/31/21 222 lb 12.8 oz (101.1 kg)  05/21/21 227 lb 9.6 oz (103.2 kg)  04/10/21 227 lb 6.4 oz (103.1 kg)     Health Maintenance Due  Topic Date Due   COVID-19 Vaccine (1) Never done   PAP SMEAR-Modifier  10/16/2019   INFLUENZA VACCINE  06/29/2021    There are no preventive care reminders to display for this patient.  Lab Results  Component Value Date   TSH 2.41 05/21/2021   Lab Results  Component Value Date   WBC 10.5 05/21/2021   HGB 14.0 05/21/2021   HCT 40.4 05/21/2021   MCV 89.5 05/21/2021   PLT 303.0 05/21/2021   Lab Results   Component Value Date   NA 137 05/21/2021   K 3.8 05/21/2021   CO2 26 05/21/2021   GLUCOSE 74 05/21/2021   BUN 9 05/21/2021   CREATININE 0.72 05/21/2021   BILITOT 0.5 05/21/2021   ALKPHOS 57 05/21/2021   AST 20 05/21/2021   ALT 40 (H) 05/21/2021   PROT 7.1 05/21/2021   ALBUMIN 4.3 05/21/2021   CALCIUM 9.5 05/21/2021   ANIONGAP 9 01/10/2021   GFR 110.50 05/21/2021   No results found for: CHOL No results found for: HDL No results found for: LDLCALC No results found for: TRIG No results found for: CHOLHDL Lab Results  Component Value Date   HGBA1C 5.2 05/21/2021      Assessment & Plan:   Problem List Items Addressed This Visit   None Visit Diagnoses     Late menses    -  Primary   Relevant Orders   POCT urine pregnancy (Completed)       No orders of the defined types were placed in this encounter.   Follow-up: No follow-ups on file.   PLAN Preg test negative.  Encouraged contraception use when not intending on pregnancy Reviewed safe and healthy relationships Reviewed reasons why menses may be late. Patient encouraged to call clinic with any questions, comments, or concerns.  05/23/2021, NP

## 2021-08-25 DIAGNOSIS — S62639A Displaced fracture of distal phalanx of unspecified finger, initial encounter for closed fracture: Secondary | ICD-10-CM | POA: Insufficient documentation

## 2021-08-25 HISTORY — DX: Displaced fracture of distal phalanx of unspecified finger, initial encounter for closed fracture: S62.639A

## 2021-09-21 ENCOUNTER — Encounter: Payer: Self-pay | Admitting: Registered Nurse

## 2021-09-22 NOTE — Telephone Encounter (Signed)
Would recommend appt if symptoms have persisted. Virtual ok  Thanks  [employer]

## 2021-09-24 ENCOUNTER — Other Ambulatory Visit: Payer: Self-pay

## 2021-09-24 ENCOUNTER — Telehealth: Payer: Medicaid Other | Admitting: Registered Nurse

## 2021-09-24 ENCOUNTER — Telehealth (INDEPENDENT_AMBULATORY_CARE_PROVIDER_SITE_OTHER): Payer: Commercial Managed Care - PPO | Admitting: Family Medicine

## 2021-09-24 DIAGNOSIS — R059 Cough, unspecified: Secondary | ICD-10-CM | POA: Diagnosis not present

## 2021-09-24 DIAGNOSIS — R0981 Nasal congestion: Secondary | ICD-10-CM | POA: Diagnosis not present

## 2021-09-24 MED ORDER — BENZONATATE 100 MG PO CAPS: ORAL_CAPSULE | ORAL | 0 refills | Status: DC

## 2021-09-24 NOTE — Progress Notes (Signed)
Virtual Visit via Video Note  I connected with Krystal Humphrey  on 09/24/21 at  6:00 PM EDT by a video enabled telemedicine application and verified that I am speaking with the correct person using two identifiers.  Location patient: home, [place] Location provider:work or home office Persons participating in the virtual visit: patient, provider  I discussed the limitations of evaluation and management by telemedicine and the availability of in person appointments. The patient expressed understanding and agreed to proceed.   HPI:  Acute telemedicine visit for cough and sinus congestion: -Onset: about 5 days -Symptoms include: nasal congestion, cough, ear discomfort, sore throat, subjective fever, some diarrhea -Denies: CP, SOB, NVD, inability drink fluids, get up out of bed -daughter had birthday party last weekend and someone at the party had flu -Pertinent past medical history:see below -Pertinent medication allergies: Allergies  Allergen Reactions   Other Anaphylaxis and Itching   Remeron [Mirtazapine] Anaphylaxis   Flagyl [Metronidazole] Nausea And Vomiting   Lexapro [Escitalopram Oxalate]     hallucinations   Tramadol Itching  -COVID-19 vaccine status:none Immunization History  Administered Date(s) Administered   Influenza Split 09/26/2011   Influenza,inj,Quad PF,6+ Mos 08/29/2019, 08/05/2020   Influenza-Unspecified 09/30/2015   Tdap 09/26/2011    ROS: See pertinent positives and negatives per HPI.  Past Medical History:  Diagnosis Date   Anxiety    Depression    GERD (gastroesophageal reflux disease)    Hiatal hernia    History of Clostridium difficile colitis    04/ 2011   History of duodenal ulcer    2009   History of esophagitis    History of panic attacks    Nephrolithiasis    bilateral per ct 07-27-2016  L > R  (right side nonobstructive)   Urgency of urination     Past Surgical History:  Procedure Laterality Date   CYSTOSCOPY WITH RETROGRADE PYELOGRAM,  URETEROSCOPY AND STENT PLACEMENT Left 07/30/2016   Procedure: CYSTOSCOPY WITH LEFT  RETROGRADE PYELOGRAM AND LEFT STENT PLACEMENT;  Surgeon: Malen Gauze, MD;  Location: [facility];  Service: Urology;  Laterality: Left;   CYSTOSCOPY WITH RETROGRADE PYELOGRAM, URETEROSCOPY AND STENT PLACEMENT Left 08/17/2016   Procedure: CYSTOSCOPY WITH LEFT RETROGRADE  STENT EXCHANGE, URETEROSCOPY AND STONE EXTRACTION, LASER LITHOTRIPSY AND LEFT RETROGRADE PYELOGRAM;  Surgeon: Bjorn Pippin, MD;  Location: [facility];  Service: Urology;  Laterality: Left;   ESOPHAGOGASTRODUODENOSCOPY  last one 03-26-2009   KNEE ARTHROSCOPY Right 03/19/2010   TONSILLECTOMY  child     Current Outpatient Medications:    ALPRAZolam (NIRAVAM) 0.5 MG dissolvable tablet, Take 1 tablet (0.5 mg total) by mouth at bedtime as needed for anxiety., Disp: 30 tablet, Rfl: 0   benzonatate (TESSALON PERLES) 100 MG capsule, 1-2 capsules every 12 hours as needed for cough, Disp: 20 capsule, Rfl: 0   cetirizine (ZYRTEC) 10 MG tablet, Take 1 tablet (10 mg total) by mouth daily., Disp: 30 tablet, Rfl: 11   cyclobenzaprine (FLEXERIL) 5 MG tablet, Take 1 tablet (5 mg total) by mouth 3 (three) times daily as needed for muscle spasms., Disp: 30 tablet, Rfl: 1   famotidine (PEPCID) 20 MG tablet, Take 1 tablet (20 mg total) by mouth daily., Disp: 10 tablet, Rfl: 0   fluconazole (DIFLUCAN) 150 MG tablet, Take 1 tablet (150 mg total) by mouth daily. Take second dose 72 hours later if symptoms still persists., Disp: 2 tablet, Rfl: 0   HYDROcodone-acetaminophen (NORCO/VICODIN) 5-325 MG tablet, Take 1 tablet by mouth every 6 (six) hours  as needed for moderate pain., Disp: 30 tablet, Rfl: 0   hydrOXYzine (ATARAX/VISTARIL) 10 MG tablet, Take 1 tablet (10 mg total) by mouth 3 (three) times daily as needed., Disp: 30 tablet, Rfl: 0   ondansetron (ZOFRAN ODT) 4 MG disintegrating tablet, Take 1 tablet (4 mg total) by mouth every 8 (eight) hours as needed  for nausea or vomiting., Disp: 20 tablet, Rfl: 0   ondansetron (ZOFRAN) 4 MG tablet, Take 1 tablet (4 mg total) by mouth every 8 (eight) hours as needed for nausea or vomiting., Disp: 20 tablet, Rfl: 0   pantoprazole (PROTONIX) 40 MG tablet, Take 1 tablet (40 mg total) by mouth daily., Disp: 30 tablet, Rfl: 3   phentermine 15 MG capsule, Take 1 capsule (15 mg total) by mouth every morning., Disp: 30 capsule, Rfl: 2   sucralfate (CARAFATE) 1 g tablet, Take 1 tablet (1 g total) by mouth 4 (four) times daily -  with meals and at bedtime., Disp: 40 tablet, Rfl: 0   sulfamethoxazole-trimethoprim (BACTRIM DS) 800-160 MG tablet, Take 1 tablet by mouth 2 (two) times daily., Disp: 6 tablet, Rfl: 0   labetalol (NORMODYNE) 200 MG tablet, Take 1 tablet (200 mg total) by mouth 2 (two) times daily., Disp: 60 tablet, Rfl: 0  EXAM:  VITALS per patient if applicable:  GENERAL: alert, oriented, appears well and in no acute distress  HEENT: atraumatic, conjunttiva clear, no obvious abnormalities on inspection of external nose and ears  NECK: normal movements of the head and neck  LUNGS: on inspection no signs of respiratory distress, breathing rate appears normal, no obvious gross SOB, gasping or wheezing  CV: no obvious cyanosis  MS: moves all visible extremities without noticeable abnormality  PSYCH/NEURO: pleasant and cooperative, no obvious depression or anxiety, speech and thought processing grossly intact  ASSESSMENT AND PLAN:  Discussed the following assessment and plan:  Cough, unspecified type  Nasal congestion  -we discussed possible serious and likely etiologies, options for evaluation and workup, limitations of telemedicine visit vs in person visit, treatment, treatment risks and precautions. Pt is agreeable to treatment via telemedicine at this moment.  She reports she preferred an in person visit, but she was told by her primary care office that she could not come in due to having  symptoms of possible COVID.  Did tell her about options for in person care in the [place] area, but she wanted to go ahead with a virtual visit tonight.  Query viral upper respiratory illness, influenza, COVID-19 versus other.  Opted for Tessalon for cough, short course nasal decongestant and other symptomatic care measures per patient instructions.  Discussed options for COVID testing.  At this point Tamiflu would not likely help. Work/School slipped offered: provided in patient instructions   Advised to seek prompt in person care if worsening, new symptoms arise, or if is not improving with treatment. Discussed options for inperson care if PCP office not available. Did let this patient know that I only do telemedicine on Tuesdays and Thursdays for [facility]. Advised to schedule follow up visit with PCP or [facility] if any further questions or concerns to avoid delays in care.   I discussed the assessment and treatment plan with the patient. The patient was provided an opportunity to ask questions and all were answered. The patient agreed with the plan and demonstrated an understanding of the instructions.     Terressa Koyanagi, DO

## 2021-09-24 NOTE — Patient Instructions (Addendum)
   ---------------------------------------------------------------------------------------------------------------------------      WORK SLIP:  Patient Krystal Humphrey,  1986-11-24, was seen for a medical visit today, 09/24/21 . Please excuse from work for a COVID/flu like illness. If Covid19 testing is positive advise 10 days minimum from the onset of symptoms (09/19/21) PLUS 1 day of no fever and improved symptoms. Will defer to employer for a sooner return to work if patient has 2 negative covid tests 48 hours apart and is feeling better, or if symptoms have resolved, it is greater than 5 days since the positive test and the patient can wear a high-quality, tight fitting mask such as N95 or KN95 at all times for an additional 5 days. Would also suggest COVID19 antigen testing is negative prior to early return from a Covid illness.  Sincerely: E-signature: Dr. Kriste Basque, DO  [facility] Ph: [phone]   ------------------------------------------------------------------------------------------------------------------------------   HOME CARE TIPS:  -COVID19 testing information: GoldAgenda.is  Most pharmacies also offer testing and home test kits. If the Covid19 test is positive and you desire antiviral treatment, please contact a [facility] pharmacy or schedule a follow up virtual visit through your primary care office or through the [employer].  Other test to treat options: http://www.vasquez-vaughn.biz/?click_source=alert  -I sent the medication(s) we discussed to your pharmacy: Meds ordered this encounter  Medications   benzonatate (TESSALON PERLES) 100 MG capsule    Sig: 1-2 capsules every 12 hours as needed for cough    Dispense:  20 capsule    Refill:  0     -can use tylenol or aleve if needed for fevers, aches and pains per instructions  -sometimes  a short course of Afrin nasal spray for 3 days can help with  symptoms as well  -humidifier at night - make sure to clean properly  -stay hydrated, drink plenty of fluids and eat small healthy meals - avoid dairy  -can take 1000 IU ( ) Vit D3 and 100-500 mg of Vit C daily per instructions  -If the Covid test is positive, check out the [facility] website for more information on home care, transmission and treatment for COVID19  -follow up with your doctor in 1-2 days unless improving and feeling better  -stay home while sick, except to seek medical care. If you have COVID19, ideally it would be best to stay home for a full 10 days since the onset of symptoms PLUS one day of no fever and feeling better. Wear a good mask that fits snugly (such as N95 or KN95) if around others to reduce the risk of transmission.  It was nice to meet you today, and I really hope you are feeling better soon. I help [facility] out with telemedicine visits on Tuesdays and Thursdays and am available for visits on those days. If you have any concerns or questions following this visit please schedule a follow up visit with your Primary Care doctor or seek care at a local urgent care clinic to avoid delays in care.    Seek in person care or schedule a follow up video visit promptly if your symptoms worsen, new concerns arise or you are not improving with treatment. Call 911 and/or seek emergency care if your symptoms are severe or life threatening.

## 2021-09-25 ENCOUNTER — Other Ambulatory Visit: Payer: Self-pay | Admitting: Registered Nurse

## 2021-09-25 ENCOUNTER — Telehealth: Payer: Medicaid Other | Admitting: Registered Nurse

## 2021-09-25 DIAGNOSIS — F411 Generalized anxiety disorder: Secondary | ICD-10-CM

## 2021-09-28 ENCOUNTER — Ambulatory Visit: Payer: Commercial Managed Care - PPO | Admitting: Physician Assistant

## 2021-09-28 ENCOUNTER — Other Ambulatory Visit: Payer: Self-pay

## 2021-09-28 VITALS — BP 134/85 | HR 80 | Temp 98.9°F | Ht 68.0 in

## 2021-09-28 DIAGNOSIS — J029 Acute pharyngitis, unspecified: Secondary | ICD-10-CM | POA: Diagnosis not present

## 2021-09-28 DIAGNOSIS — R0981 Nasal congestion: Secondary | ICD-10-CM | POA: Diagnosis not present

## 2021-09-28 DIAGNOSIS — R059 Cough, unspecified: Secondary | ICD-10-CM

## 2021-09-28 MED ORDER — PREDNISONE 20 MG PO TABS: 20.0000 mg | ORAL_TABLET | Freq: Two times a day (BID) | ORAL | 0 refills | Status: AC

## 2021-09-28 MED ORDER — ALPRAZOLAM 0.5 MG PO TBDP: 0.5000 mg | ORAL_TABLET | Freq: Every evening | ORAL | 0 refills | Status: DC | PRN

## 2021-09-28 MED ORDER — AMOXICILLIN-POT CLAVULANATE 875-125 MG PO TABS: 1.0000 | ORAL_TABLET | Freq: Two times a day (BID) | ORAL | 0 refills | Status: DC

## 2021-09-28 NOTE — Telephone Encounter (Signed)
Patient is requesting a refill of the following medications: Requested Prescriptions   Pending Prescriptions Disp Refills   ALPRAZolam (NIRAVAM) 0.5 MG dissolvable tablet 30 tablet 0    Sig: Take 1 tablet (0.5 mg total) by mouth at bedtime as needed for anxiety.    Date of patient request: 09/25/2021 Last office visit: 07/31/2021 Date of last refill: 08/20/2021 Last refill amount: 30 tablets Follow up time period per chart: today at [facility].

## 2021-09-28 NOTE — Progress Notes (Signed)
Subjective:    Patient ID: [name], female    DOB: 1986-07-13, 35 y.o.   MRN: [id number]  No chief complaint on file.   HPI Patient is in today for cold symptoms x 8 days.  Chief complaint: ST, ear popping, hoarseness  Symptom onset: 8 days ago Pertinent positives: Occasional cough  Pertinent negatives: Fever, chills, body aches, N/V/D Treatments tried: Theraflu, cough drops  Vaccine status: No flu or COVID shots  Sick exposure: +flu at a birthday party a few weeks ago   COVID-19 at home test negative 09/24/21 after video appointment.    Past Medical History:  Diagnosis Date   Anxiety    Depression    GERD (gastroesophageal reflux disease)    Hiatal hernia    History of Clostridium difficile colitis    04/ 2011   History of duodenal ulcer    2009   History of esophagitis    History of panic attacks    Nephrolithiasis    bilateral per ct 07-27-2016  L > R  (right side nonobstructive)   Urgency of urination     Past Surgical History:  Procedure Laterality Date   CYSTOSCOPY WITH RETROGRADE PYELOGRAM, URETEROSCOPY AND STENT PLACEMENT Left 07/30/2016   Procedure: CYSTOSCOPY WITH LEFT  RETROGRADE PYELOGRAM AND LEFT STENT PLACEMENT;  Surgeon: Malen Gauze, MD;  Location: [facility];  Service: Urology;  Laterality: Left;   CYSTOSCOPY WITH RETROGRADE PYELOGRAM, URETEROSCOPY AND STENT PLACEMENT Left 08/17/2016   Procedure: CYSTOSCOPY WITH LEFT RETROGRADE  STENT EXCHANGE, URETEROSCOPY AND STONE EXTRACTION, LASER LITHOTRIPSY AND LEFT RETROGRADE PYELOGRAM;  Surgeon: Bjorn Pippin, MD;  Location: [facility];  Service: Urology;  Laterality: Left;   ESOPHAGOGASTRODUODENOSCOPY  last one 03-26-2009   KNEE ARTHROSCOPY Right 03/19/2010   TONSILLECTOMY  child    Family History  Problem Relation Age of Onset   Diabetes Mother    Thyroid disease Mother    Heart attack Father    Diabetes Maternal Aunt    Diabetes Maternal Uncle    COPD Maternal Grandmother     Crohn's disease Maternal Grandmother    Anesthesia problems Neg Hx    Hypotension Neg Hx    Malignant hyperthermia Neg Hx    Pseudochol deficiency Neg Hx     Social History   Tobacco Use   Smoking status: Former    Packs/day: 0.25    Years: 2.00    Pack years: 0.50    Types: Cigarettes    Quit date: 09/20/2008    Years since quitting: 13.0   Smokeless tobacco: Never  Vaping Use   Vaping Use: Never used  Substance Use Topics   Alcohol use: Yes    Comment: occasional   Drug use: No     Allergies  Allergen Reactions   Other Anaphylaxis and Itching   Remeron [Mirtazapine] Anaphylaxis   Flagyl [Metronidazole] Nausea And Vomiting   Lexapro [Escitalopram Oxalate]     hallucinations   Tramadol Itching    Review of Systems REFER TO HPI FOR PERTINENT POSITIVES AND NEGATIVES      Objective:     BP 134/85   Pulse 80   Temp 98.9 F (37.2 C)   Ht 5\' 8"  (1.727 m)   SpO2 98%   BMI 33.88 kg/m   Wt Readings from Last 3 Encounters:  07/31/21 222 lb 12.8 oz (101.1 kg)  05/21/21 227 lb 9.6 oz (103.2 kg)  04/10/21 227 lb 6.4 oz (103.1 kg)    BP Readings from  Last 3 Encounters:  09/28/21 134/85  07/31/21 116/70  05/21/21 138/78     Physical Exam Vitals and nursing note reviewed.  Constitutional:      Appearance: Normal appearance. She is normal weight. She is not toxic-appearing.  HENT:     Head: Normocephalic and atraumatic.     Right Ear: Tympanic membrane, ear canal and external ear normal.     Left Ear: Tympanic membrane, ear canal and external ear normal.     Nose: Congestion present.     Mouth/Throat:     Mouth: Mucous membranes are moist.     Pharynx: Posterior oropharyngeal erythema present.  Eyes:     Extraocular Movements: Extraocular movements intact.     Conjunctiva/sclera: Conjunctivae normal.     Pupils: Pupils are equal, round, and reactive to light.  Cardiovascular:     Rate and Rhythm: Normal rate and regular rhythm.     Pulses: Normal  pulses.     Heart sounds: Normal heart sounds.  Pulmonary:     Effort: Pulmonary effort is normal.     Breath sounds: Normal breath sounds.  Abdominal:     General: Abdomen is flat. Bowel sounds are normal.     Palpations: Abdomen is soft.  Musculoskeletal:        General: Normal range of motion.     Cervical back: Normal range of motion and neck supple.  Skin:    General: Skin is warm and dry.  Neurological:     General: No focal deficit present.     Mental Status: She is alert and oriented to person, place, and time.  Psychiatric:        Mood and Affect: Mood normal.        Behavior: Behavior normal.        Thought Content: Thought content normal.        Judgment: Judgment normal.       Assessment & Plan:   Problem List Items Addressed This Visit   None Visit Diagnoses     Acute pharyngitis, unspecified etiology    -  Primary   Cough, unspecified type       Nasal congestion            Meds ordered this encounter  Medications   amoxicillin-clavulanate (AUGMENTIN) 875-125 MG tablet    Sig: Take 1 tablet by mouth 2 (two) times daily.    Dispense:  20 tablet    Refill:  0   predniSONE (DELTASONE) 20 MG tablet    Sig: Take 1 tablet (20 mg total) by mouth 2 (two) times daily with a meal for 4 days.    Dispense:  8 tablet    Refill:  0   1. Acute pharyngitis, unspecified etiology 2. Cough, unspecified type 3. Nasal congestion -Discussed several testing options today, none of which would change plan of treatment though. (Outside of window for antivirals for flu and COVID-19). -She has failed conservative therapy... -Persistent symptoms >7 days despite conservative efforts at home. Will Rx Augmentin at this time, take with food. Cautioned on antibiotic use and possible side effects. Advised nasal saline, humidifier, and pushing fluids. Prednisone for added relief of cough and nasal congestion.  -Pt to call if worse or no improvement.    Sheng Pritz M Ahmad Vanwey, PA-C

## 2021-09-28 NOTE — Patient Instructions (Signed)
Take the medications as directed. REST!! Feel better soon.

## 2022-02-24 ENCOUNTER — Encounter: Payer: Self-pay | Admitting: Registered Nurse

## 2022-02-24 ENCOUNTER — Ambulatory Visit (INDEPENDENT_AMBULATORY_CARE_PROVIDER_SITE_OTHER): Payer: Commercial Managed Care - PPO | Admitting: Registered Nurse

## 2022-02-24 VITALS — BP 117/83 | HR 73 | Temp 98.0°F | Resp 16 | Ht 68.0 in | Wt 218.0 lb

## 2022-02-24 DIAGNOSIS — Z23 Encounter for immunization: Secondary | ICD-10-CM | POA: Diagnosis not present

## 2022-02-24 DIAGNOSIS — N926 Irregular menstruation, unspecified: Secondary | ICD-10-CM

## 2022-02-24 DIAGNOSIS — Z319 Encounter for procreative management, unspecified: Secondary | ICD-10-CM | POA: Diagnosis not present

## 2022-02-24 LAB — POCT URINE PREGNANCY: Preg Test, Ur: NEGATIVE

## 2022-02-24 NOTE — Patient Instructions (Addendum)
Ms. Krystal Humphrey -  ? ?Great to see you! ? ?Negative on site pregnancy test. Will send out blood test. ? ? ?Referral to  OBGYN placed. Will be good to get est there in case menses don't resume normally or if pregnancy does happen. ? ?TDaP given today - good for ten years.  ? ?Thanks, ? ?Rich  ? ? ? ?If you have lab work done today you will be contacted with your lab results within the next 2 weeks.  If you have not heard from [place] then please contact us. The fastest way to get your results is to register for My Chart. ? ? ?IF you received an x-ray today, you will receive an invoice from [facility]. Please contact [facility] at [phone] with questions or concerns regarding your invoice.  ? ?IF you received labwork today, you will receive an invoice from [facility]. Please contact [facility] at [phone] with questions or concerns regarding your invoice.  ? ?Our billing staff will not be able to assist you with questions regarding bills from these companies. ? ?You will be contacted with the lab results as soon as they are available. The fastest way to get your results is to activate your My Chart account. Instructions are located on the last page of this paperwork. If you have not heard from [place] regarding the results in 2 weeks, please contact this office. ?  ? ? ?

## 2022-02-24 NOTE — Progress Notes (Signed)
? ?Acute Office Visit ? ?Subjective:  ? ? Patient ID: Krystal Humphrey, female    DOB: Jun 10, 1986, 36 y.o.   MRN: [id number] ? ?Chief Complaint  ?Patient presents with  ? Menstrual Problem  ?  Patient states she is here because she is having a late cycle that has been missing for 40 days. Patient states she has had some spotting today   ? ? ?HPI ?Patient is in today for late cycle ? ?LMP: 01/18/22 ?Interested in becoming pregnant  ?Here with partner, who is supportive.  ? ?Due for tdap. Amenable to this today. ? ?Outpatient Medications Prior to Visit  ?Medication Sig Dispense Refill  ? ALPRAZolam (NIRAVAM) 0.5 MG dissolvable tablet Take 1 tablet (0.5 mg total) by mouth at bedtime as needed for anxiety. 30 tablet 0  ? cetirizine (ZYRTEC) 10 MG tablet Take 1 tablet (10 mg total) by mouth daily. 30 tablet 11  ? cyclobenzaprine (FLEXERIL) 5 MG tablet Take 1 tablet (5 mg total) by mouth 3 (three) times daily as needed for muscle spasms. 30 tablet 1  ? famotidine (PEPCID) 20 MG tablet Take 1 tablet (20 mg total) by mouth daily. 10 tablet 0  ? HYDROcodone-acetaminophen (NORCO/VICODIN) 5-325 MG tablet Take 1 tablet by mouth every 6 (six) hours as needed for moderate pain. 30 tablet 0  ? hydrOXYzine (ATARAX/VISTARIL) 10 MG tablet Take 1 tablet (10 mg total) by mouth 3 (three) times daily as needed. 30 tablet 0  ? ondansetron (ZOFRAN ODT) 4 MG disintegrating tablet Take 1 tablet (4 mg total) by mouth every 8 (eight) hours as needed for nausea or vomiting. 20 tablet 0  ? ondansetron (ZOFRAN) 4 MG tablet Take 1 tablet (4 mg total) by mouth every 8 (eight) hours as needed for nausea or vomiting. 20 tablet 0  ? pantoprazole (PROTONIX) 40 MG tablet Take 1 tablet (40 mg total) by mouth daily. 30 tablet 3  ? sucralfate (CARAFATE) 1 g tablet Take 1 tablet (1 g total) by mouth 4 (four) times daily -  with meals and at bedtime. 40 tablet 0  ? amoxicillin-clavulanate (AUGMENTIN) 875-125 MG tablet Take 1 tablet by mouth 2 (two) times daily.  20 tablet 0  ? benzonatate (TESSALON PERLES) 100 MG capsule 1-2 capsules every 12 hours as needed for cough 20 capsule 0  ? fluconazole (DIFLUCAN) 150 MG tablet Take 1 tablet (150 mg total) by mouth daily. Take second dose 72 hours later if symptoms still persists. 2 tablet 0  ? phentermine 15 MG capsule Take 1 capsule (15 mg total) by mouth every morning. 30 capsule 2  ? labetalol (NORMODYNE) 200 MG tablet Take 1 tablet (200 mg total) by mouth 2 (two) times daily. 60 tablet 0  ? ?No facility-administered medications prior to visit.  ? ? ?Review of Systems  ?Constitutional: Negative.   ?HENT: Negative.    ?Eyes: Negative.   ?Respiratory: Negative.    ?Cardiovascular: Negative.   ?Gastrointestinal: Negative.   ?Genitourinary: Negative.   ?Musculoskeletal: Negative.   ?Skin: Negative.   ?Neurological: Negative.   ?Psychiatric/Behavioral: Negative.    ?All other systems reviewed and are negative. ? ?   ?Objective:  ?  ?BP 117/83   Pulse 73   Temp 98 ?F (36.7 ?C) (Temporal)   Resp 16   Ht 5\' 8"  (1.727 m)   Wt 218 lb (98.9 kg)   SpO2 100%   BMI 33.15 kg/m?  ?Physical Exam ?Vitals and nursing note reviewed.  ?Constitutional:   ?   General:  She is not in acute distress. ?   Appearance: Normal appearance. She is normal weight. She is not ill-appearing, toxic-appearing or diaphoretic.  ?Cardiovascular:  ?   Rate and Rhythm: Normal rate and regular rhythm.  ?   Heart sounds: Normal heart sounds. No murmur heard. ?  No friction rub. No gallop.  ?Pulmonary:  ?   Effort: Pulmonary effort is normal. No respiratory distress.  ?   Breath sounds: Normal breath sounds. No stridor. No wheezing, rhonchi or rales.  ?Chest:  ?   Chest wall: No tenderness.  ?Skin: ?   General: Skin is warm and dry.  ?Neurological:  ?   General: No focal deficit present.  ?   Mental Status: She is alert and oriented to person, place, and time. Mental status is at baseline.  ?Psychiatric:     ?   Mood and Affect: Mood normal.     ?   Behavior:  Behavior normal.     ?   Thought Content: Thought content normal.     ?   Judgment: Judgment normal.  ? ? ?Results for orders placed or performed in visit on 02/24/22  ?POCT urine pregnancy  ?Result Value Ref Range  ? Preg Test, Ur Negative Negative  ? ? ? ?   ?Assessment & Plan:  ?1. Missed menses ?- POCT urine pregnancy ?- Ambulatory referral to Obstetrics / Gynecology ?- Beta hCG quant (ref lab) ? ?2. Need for diphtheria-tetanus-pertussis (Tdap) vaccine ?- Tdap vaccine greater than or equal to 7yo IM ? ?3. Desire for pregnancy ?- Ambulatory referral to Obstetrics / Gynecology ? ? ? ?No orders of the defined types were placed in this encounter. ? ? ?Return if symptoms worsen or fail to improve. ? ?PLAN ?Poct urine preg test negative. Given 10 days late on cycle, will draw beta quant. ?Refer to obgyn for care, missed menses. ?Tdap given ?Patient encouraged to call clinic with any questions, comments, or concerns. ? ?Janeece Agee, NP ?

## 2022-02-25 LAB — BETA HCG QUANT (REF LAB): hCG Quant: <1 m[IU]/mL

## 2022-03-18 LAB — HM PAP SMEAR

## 2022-05-09 ENCOUNTER — Encounter (INDEPENDENT_AMBULATORY_CARE_PROVIDER_SITE_OTHER): Payer: Commercial Managed Care - PPO | Admitting: Registered Nurse

## 2022-05-09 DIAGNOSIS — Z6830 Body mass index (BMI) 30.0-30.9, adult: Secondary | ICD-10-CM

## 2022-05-09 DIAGNOSIS — E6609 Other obesity due to excess calories: Secondary | ICD-10-CM

## 2022-05-10 NOTE — Telephone Encounter (Signed)
Response from pt is that her husband and her will not be trying for a child until about April next year.  Please advise

## 2022-05-11 ENCOUNTER — Telehealth: Payer: Self-pay

## 2022-05-11 MED ORDER — PHENTERMINE HCL 37.5 MG PO CAPS: 37.5000 mg | ORAL_CAPSULE | ORAL | 0 refills | Status: DC

## 2022-05-11 NOTE — Telephone Encounter (Signed)
Pa submitted thru Cover my meds . Waiting on reponse

## 2022-05-11 NOTE — Telephone Encounter (Signed)
[facility] VISIT   Patient agreed to [facility] visit and is aware that copayment and coinsurance may apply. Patient was treated using telemedicine according to accepted telemedicine protocols.  Subjective:   Patient complains of difficulty losing weight/obesity  Patient Active Problem List   Diagnosis Date Noted   Traumatic hematoma of knee, left, subsequent encounter 09/23/2020   Pain and swelling of left knee 09/16/2020   Contusion of left knee 09/16/2020   Generalized anxiety disorder 06/20/2019   Obesity 01/07/2015   Social History   Tobacco Use   Smoking status: Former    Packs/day: 0.25    Years: 2.00    Total pack years: 0.50    Types: Cigarettes    Quit date: 09/20/2008    Years since quitting: 13.6   Smokeless tobacco: Never  Substance Use Topics   Alcohol use: Yes    Comment: occasional    Current Outpatient Medications:    phentermine 37.5 MG capsule, Take 1 capsule (37.5 mg total) by mouth every morning., Disp: 30 capsule, Rfl: 0   ALPRAZolam (NIRAVAM) 0.5 MG dissolvable tablet, Take 1 tablet (0.5 mg total) by mouth at bedtime as needed for anxiety., Disp: 30 tablet, Rfl: 0   cetirizine (ZYRTEC) 10 MG tablet, Take 1 tablet (10 mg total) by mouth daily., Disp: 30 tablet, Rfl: 11   cyclobenzaprine (FLEXERIL) 5 MG tablet, Take 1 tablet (5 mg total) by mouth 3 (three) times daily as needed for muscle spasms., Disp: 30 tablet, Rfl: 1   famotidine (PEPCID) 20 MG tablet, Take 1 tablet (20 mg total) by mouth daily., Disp: 10 tablet, Rfl: 0   HYDROcodone-acetaminophen (NORCO/VICODIN) 5-325 MG tablet, Take 1 tablet by mouth every 6 (six) hours as needed for moderate pain., Disp: 30 tablet, Rfl: 0   hydrOXYzine (ATARAX/VISTARIL) 10 MG tablet, Take 1 tablet (10 mg total) by mouth 3 (three) times daily as needed., Disp: 30 tablet, Rfl: 0   labetalol (NORMODYNE) 200 MG tablet, Take 1 tablet (200 mg total) by mouth 2 (two) times daily., Disp: 60 tablet, Rfl: 0   ondansetron (ZOFRAN  ODT) 4 MG disintegrating tablet, Take 1 tablet (4 mg total) by mouth every 8 (eight) hours as needed for nausea or vomiting., Disp: 20 tablet, Rfl: 0   ondansetron (ZOFRAN) 4 MG tablet, Take 1 tablet (4 mg total) by mouth every 8 (eight) hours as needed for nausea or vomiting., Disp: 20 tablet, Rfl: 0   pantoprazole (PROTONIX) 40 MG tablet, Take 1 tablet (40 mg total) by mouth daily., Disp: 30 tablet, Rfl: 3   sucralfate (CARAFATE) 1 g tablet, Take 1 tablet (1 g total) by mouth 4 (four) times daily -  with meals and at bedtime., Disp: 40 tablet, Rfl: 0  Allergies  Allergen Reactions   Other Anaphylaxis and Itching   Remeron [Mirtazapine] Anaphylaxis   Flagyl [Metronidazole] Nausea And Vomiting   Lexapro [Escitalopram Oxalate]     hallucinations   Tramadol Itching    Assessment and Plan:   Diagnosis: Obesity. Please see myChart communication and orders below.   No orders of the defined types were placed in this encounter.  Meds ordered this encounter  Medications   phentermine 37.5 MG capsule    Sig: Take 1 capsule (37.5 mg total) by mouth every morning.    Dispense:  30 capsule    Refill:  0    Annye Asa, MD 05/11/2022  A total of 7 minutes were spent by me to personally review the patient-generated inquiry, review patient  records and data pertinent to assessment of the patient's problem, develop a management plan including generation of prescriptions and/or orders, and on subsequent communication with the patient through secure the MyChart portal service.   There is no separately reported E/M service related to this service in the past 7 days nor does the patient have an upcoming soonest available appointment for this issue. This work was completed in less than 7 days.   The patient consented to this service today (see patient agreement prior to ongoing communication). Patient counseled regarding the need for in-person exam for certain conditions and was advised to call the  office if any changing or worsening symptoms occur.   The codes to be used for the E/M service are: [x]   99421 for 5-10 minutes of time spent on the inquiry. []   [id number] for 11-20 minutes. []   [id number] for 21+ minutes.

## 2022-05-12 NOTE — Telephone Encounter (Signed)
Spoke w/ pt and informed her that she can use the Good Rx card

## 2022-05-26 ENCOUNTER — Encounter: Payer: Self-pay | Admitting: Registered Nurse

## 2022-05-27 NOTE — Telephone Encounter (Signed)
If we could have her in for a visit  Thanks,  Luan Pulling

## 2022-05-31 ENCOUNTER — Other Ambulatory Visit: Payer: Self-pay | Admitting: Registered Nurse

## 2022-05-31 NOTE — Telephone Encounter (Signed)
Pt called stating phentramine 37.5 mg is working and pt needs a refill.  She also states that has cut out sweets and is doing better. Please send to  [facility] on [address] [phone]

## 2022-06-07 ENCOUNTER — Encounter: Payer: Self-pay | Admitting: Registered Nurse

## 2022-06-07 ENCOUNTER — Telehealth (INDEPENDENT_AMBULATORY_CARE_PROVIDER_SITE_OTHER): Payer: BC Managed Care – PPO | Admitting: Registered Nurse

## 2022-06-07 VITALS — Wt 199.0 lb

## 2022-06-07 DIAGNOSIS — E6609 Other obesity due to excess calories: Secondary | ICD-10-CM | POA: Diagnosis not present

## 2022-06-07 DIAGNOSIS — Z6830 Body mass index (BMI) 30.0-30.9, adult: Secondary | ICD-10-CM

## 2022-06-07 MED ORDER — PHENTERMINE HCL 37.5 MG PO CAPS: 37.5000 mg | ORAL_CAPSULE | ORAL | 0 refills | Status: DC

## 2022-06-07 NOTE — Progress Notes (Signed)
Telemedicine Encounter- SOAP NOTE Established Patient  This telephone encounter was conducted with the patient's (or proxy's) verbal consent via audio telecommunications: yes/no: Yes Patient was instructed to have this encounter in a suitably private space; and to only have persons present to whom they give permission to participate. In addition, patient identity was confirmed by use of name plus two identifiers (DOB and address).  I discussed the limitations, risks, security and privacy concerns of performing an evaluation and management service by telephone and the availability of in person appointments. I also discussed with the patient that there may be a patient responsible charge related to this service. The patient expressed understanding and agreed to proceed.  I spent a total of 17 minutes talking with the patient or their proxy.  Patient at home Provider in office  Participants: Krystal Sportsman, NP and Krystal Humphrey  Chief Complaint  Patient presents with   Medication Refill    Pt states she is needing a refill on phentermine  Doing well on medication per pt     Subjective   Krystal Humphrey is a 36 y.o. established patient. Telephone visit today for med refill  HPI Phentermine 37.5mg  po qd Good effect, no AE.  Has committed to consistent diet and exercise. She is limiting processed foods and sugary foods.  She is drinking more water.  She continues to hydrate well.   Patient Active Problem List   Diagnosis Date Noted   Closed fracture of distal phalanx of finger 08/25/2021   Pain in finger 08/17/2021   Traumatic hematoma of knee, left, subsequent encounter 09/23/2020   Pain and swelling of left knee 09/16/2020   Contusion of left knee 09/16/2020   Generalized anxiety disorder 06/20/2019   Obesity 01/07/2015    Past Medical History:  Diagnosis Date   Anxiety    Depression    GERD (gastroesophageal reflux disease)    Hiatal hernia    History of Clostridium  difficile colitis    04/ 2011   History of duodenal ulcer    2009   History of esophagitis    History of panic attacks    Nephrolithiasis    bilateral per ct 07-27-2016  L > R  (right side nonobstructive)   Urgency of urination     Current Outpatient Medications  Medication Sig Dispense Refill   labetalol (NORMODYNE) 200 MG tablet Take 1 tablet (200 mg total) by mouth 2 (two) times daily. 60 tablet 0   phentermine 37.5 MG capsule Take 1 capsule (37.5 mg total) by mouth every morning. 90 capsule 0   No current facility-administered medications for this visit.    Allergies  Allergen Reactions   Metronidazole Nausea And Vomiting and Rash   Mirtazapine Anaphylaxis and Shortness Of Breath   Other Anaphylaxis and Itching   Tramadol Itching   Escitalopram Other (See Comments)   Lexapro [Escitalopram Oxalate]     hallucinations    Social History   Socioeconomic History   Marital status: Single    Spouse name: Not on file   Number of children: 1   Years of education: 12   Highest education level: Not on file  Occupational History   Occupation: unemployed  Tobacco Use   Smoking status: Former    Packs/day: 0.25    Years: 2.00    Total pack years: 0.50    Types: Cigarettes    Quit date: 09/20/2008    Years since quitting: 13.7   Smokeless tobacco: Never  Vaping Use  Vaping Use: Never used  Substance and Sexual Activity   Alcohol use: Yes    Comment: occasional   Drug use: No   Sexual activity: Yes    Birth control/protection: Implant    Comment: implant placed 09/ 2014  Other Topics Concern   Not on file  Social History Narrative   Born and raised in [place], [place]. Currently resides in a house with her boyfriend and daughter. 2 dogs. Fun: Softball, bowl   Denies religious beliefs that would effect health care.    Social Determinants of Health   Financial Resource Strain: Not on file  Food Insecurity: Not on file  Transportation Needs: Not on file  Physical  Activity: Not on file  Stress: Not on file  Social Connections: Not on file  Intimate Partner Violence: Not on file    Review of Systems  Constitutional: Negative.   HENT: Negative.    Eyes: Negative.   Respiratory: Negative.    Cardiovascular: Negative.   Gastrointestinal: Negative.   Genitourinary: Negative.   Musculoskeletal: Negative.   Skin: Negative.   Neurological: Negative.   Endo/Heme/Allergies: Negative.   Psychiatric/Behavioral: Negative.    All other systems reviewed and are negative.   Objective   Vitals as reported by the patient: Today's Vitals   06/07/22 1152  Weight: 199 lb (90.3 kg)    [name] was seen today for medication refill.  Diagnoses and all orders for this visit:  Class 1 obesity due to excess calories without serious comorbidity with body mass index (BMI) of 30.0 to 30.9 in adult -     phentermine 37.5 MG capsule; Take 1 capsule (37.5 mg total) by mouth every morning.    PLAN Refill phentermine  Advised on risks, benefits, AE. She voices understanding Patient encouraged to call clinic with any questions, comments, or concerns.   I discussed the assessment and treatment plan with the patient. The patient was provided an opportunity to ask questions and all were answered. The patient agreed with the plan and demonstrated an understanding of the instructions.   The patient was advised to call back or seek an in-person evaluation if the symptoms worsen or if the condition fails to improve as anticipated.  I provided 17 minutes of non-face-to-face time during this encounter.  Krystal Agee, NP

## 2022-06-28 ENCOUNTER — Encounter: Payer: Self-pay | Admitting: Family Medicine

## 2022-06-29 ENCOUNTER — Encounter: Payer: Self-pay | Admitting: Registered Nurse

## 2022-08-13 ENCOUNTER — Encounter: Payer: Self-pay | Admitting: Emergency Medicine

## 2022-08-13 ENCOUNTER — Ambulatory Visit
Admission: EM | Admit: 2022-08-13 | Discharge: 2022-08-13 | Disposition: A | Discharge status: Home / Self Care | Payer: Medicaid Other | Attending: Urgent Care | Admitting: Urgent Care

## 2022-08-13 DIAGNOSIS — Z3201 Encounter for pregnancy test, result positive: Secondary | ICD-10-CM

## 2022-08-13 DIAGNOSIS — N912 Amenorrhea, unspecified: Secondary | ICD-10-CM

## 2022-08-13 LAB — POCT URINE PREGNANCY: Preg Test, Ur: NEGATIVE

## 2022-08-13 MED ORDER — CETIRIZINE HCL 10 MG PO TABS: 10.0000 mg | ORAL_TABLET | Freq: Every day | ORAL | 0 refills | Status: DC

## 2022-08-13 MED ORDER — DOXYLAMINE-PYRIDOXINE 10-10 MG PO TBEC: 1.0000 | DELAYED_RELEASE_TABLET | Freq: Two times a day (BID) | ORAL | 0 refills | Status: DC | PRN

## 2022-08-13 MED ORDER — FLUTICASONE PROPIONATE 50 MCG/ACT NA SUSP: 2.0000 | Freq: Every day | NASAL | 12 refills | Status: DC

## 2022-08-13 NOTE — ED Triage Notes (Signed)
Pt here with constant nausea x 1 week. Has not missed period yet. Saw faint line on preg test yesterday.

## 2022-08-13 NOTE — Discharge Instructions (Addendum)
We will let you know about your blood test results when they get back. In the meantime, you can start Flonase and Zyrtec for you sinuses. It would be okay if you wanted to use a prenatal vitamin and Diclegis for nausea (which is safe in pregnancy).

## 2022-08-13 NOTE — ED Provider Notes (Signed)
[facility]  Note:  This document was prepared using Conservation officer, historic buildings and may include unintentional dictation errors.  MRN: [id number] DOB: 1986-10-30  Subjective:   Yanett Conkright is a 36 y.o. female presenting for pregnancy test.  For the past week, patient has felt persistent nausea.  No vomiting.  No fever, abdominal pain, chest pain, dysuria, hematuria, vaginal discharge, bloody stools, constipation.  Patient decided to take a home pregnancy test and was positive.  Would like confirmation here.  She has also started to have congestion which is not abnormal for her as she has allergies.  She was not having issues with it until this past week however.  She states that it was very similar to when she was pregnant the last time.  No current facility-administered medications for this encounter.  Current Outpatient Medications:    labetalol (NORMODYNE) 200 MG tablet, Take 1 tablet (200 mg total) by mouth 2 (two) times daily., Disp: 60 tablet, Rfl: 0   phentermine 37.5 MG capsule, Take 1 capsule (37.5 mg total) by mouth every morning., Disp: 90 capsule, Rfl: 0   Allergies  Allergen Reactions   Metronidazole Nausea And Vomiting and Rash   Mirtazapine Anaphylaxis and Shortness Of Breath   Other Anaphylaxis and Itching   Tramadol Itching   Escitalopram Other (See Comments)   Lexapro [Escitalopram Oxalate]     hallucinations    Past Medical History:  Diagnosis Date   Anxiety    Depression    GERD (gastroesophageal reflux disease)    Hiatal hernia    History of Clostridium difficile colitis    04/ 2011   History of duodenal ulcer    2009   History of esophagitis    History of panic attacks    Nephrolithiasis    bilateral per ct 07-27-2016  L > R  (right side nonobstructive)   Urgency of urination      Past Surgical History:  Procedure Laterality Date   CYSTOSCOPY WITH RETROGRADE PYELOGRAM, URETEROSCOPY AND STENT PLACEMENT Left 07/30/2016    Procedure: CYSTOSCOPY WITH LEFT  RETROGRADE PYELOGRAM AND LEFT STENT PLACEMENT;  Surgeon: Malen Gauze, MD;  Location: [facility];  Service: Urology;  Laterality: Left;   CYSTOSCOPY WITH RETROGRADE PYELOGRAM, URETEROSCOPY AND STENT PLACEMENT Left 08/17/2016   Procedure: CYSTOSCOPY WITH LEFT RETROGRADE  STENT EXCHANGE, URETEROSCOPY AND STONE EXTRACTION, LASER LITHOTRIPSY AND LEFT RETROGRADE PYELOGRAM;  Surgeon: Bjorn Pippin, MD;  Location: [facility];  Service: Urology;  Laterality: Left;   ESOPHAGOGASTRODUODENOSCOPY  last one 03-26-2009   KNEE ARTHROSCOPY Right 03/19/2010   TONSILLECTOMY  child    Family History  Problem Relation Age of Onset   Diabetes Mother    Thyroid disease Mother    Heart attack Father    Diabetes Maternal Aunt    Diabetes Maternal Uncle    COPD Maternal Grandmother    Crohn's disease Maternal Grandmother    Anesthesia problems Neg Hx    Hypotension Neg Hx    Malignant hyperthermia Neg Hx    Pseudochol deficiency Neg Hx     Social History   Tobacco Use   Smoking status: Former    Packs/day: 0.25    Years: 2.00    Total pack years: 0.50    Types: Cigarettes    Quit date: 09/20/2008    Years since quitting: 13.9   Smokeless tobacco: Never  Vaping Use   Vaping Use: Never used  Substance Use Topics   Alcohol use:  Yes    Comment: occasional   Drug use: No    ROS   Objective:   Vitals: BP 136/83   Pulse 86   Temp 98 F (36.7 C)   Resp 20   LMP 07/19/2022 (Exact Date)   SpO2 98%   Physical Exam Constitutional:      General: She is not in acute distress.    Appearance: Normal appearance. She is well-developed. She is not ill-appearing, toxic-appearing or diaphoretic.  HENT:     Head: Normocephalic and atraumatic.     Nose: Nose normal.     Mouth/Throat:     Mouth: Mucous membranes are moist.     Pharynx: Oropharynx is clear.  Eyes:     General: No scleral icterus.       Right eye: No discharge.        Left eye: No  discharge.     Extraocular Movements: Extraocular movements intact.     Conjunctiva/sclera: Conjunctivae normal.  Cardiovascular:     Rate and Rhythm: Normal rate.  Pulmonary:     Effort: Pulmonary effort is normal.  Abdominal:     General: Bowel sounds are normal. There is no distension.     Palpations: Abdomen is soft. There is no mass.     Tenderness: There is no abdominal tenderness. There is no right CVA tenderness, left CVA tenderness, guarding or rebound.  Skin:    General: Skin is warm and dry.  Neurological:     General: No focal deficit present.     Mental Status: She is alert and oriented to person, place, and time.  Psychiatric:        Mood and Affect: Mood normal.        Behavior: Behavior normal.        Thought Content: Thought content normal.        Judgment: Judgment normal.     Results for orders placed or performed during the hospital encounter of 08/13/22 (from the past 24 hour(s))  POCT urine pregnancy     Status: None   Collection Time: 08/13/22 12:58 PM  Result Value Ref Range   Preg Test, Ur Negative Negative    Assessment and Plan :   PDMP not reviewed this encounter.  1. Positive pregnancy test   2. Amenorrhea    Patient had a positive pregnancy test at home but our test in clinic was negative.  She would like blood work to confirm this.  Recommended supportive care otherwise, medications that are safe in pregnancy. Counseled patient on potential for adverse effects with medications prescribed/recommended today, ER and return-to-clinic precautions discussed, patient verbalized understanding.    Wallis Bamberg, [place] 08/13/22 1342

## 2022-08-14 LAB — HCG, SERUM, QUALITATIVE: hCG,Beta Subunit,Qual,Serum: POSITIVE m[IU]/mL — AB (ref <6)

## 2022-09-03 ENCOUNTER — Ambulatory Visit (INDEPENDENT_AMBULATORY_CARE_PROVIDER_SITE_OTHER): Payer: BC Managed Care – PPO | Admitting: Nurse Practitioner

## 2022-09-03 ENCOUNTER — Encounter: Payer: Self-pay | Admitting: Nurse Practitioner

## 2022-09-03 VITALS — BP 122/64 | HR 67 | Temp 98.1°F | Ht 68.0 in | Wt 196.1 lb

## 2022-09-03 DIAGNOSIS — Z3490 Encounter for supervision of normal pregnancy, unspecified, unspecified trimester: Secondary | ICD-10-CM

## 2022-09-03 DIAGNOSIS — R6889 Other general symptoms and signs: Secondary | ICD-10-CM

## 2022-09-03 LAB — BASIC METABOLIC PANEL
BUN: 11 mg/dL (ref 6–23)
CO2: 28 mEq/L (ref 19–32)
Calcium: 8.8 mg/dL (ref 8.4–10.5)
Chloride: 101 mEq/L (ref 96–112)
Creatinine, Ser: 0.74 mg/dL (ref 0.40–1.20)
GFR: 105.96 mL/min (ref >60.00)
Glucose, Bld: 73 mg/dL (ref 70–99)
Potassium: 3.6 mEq/L (ref 3.5–5.1)
Sodium: 136 mEq/L (ref 135–145)

## 2022-09-03 LAB — HCG, QUANTITATIVE, PREGNANCY: Quantitative HCG: 56272 m[IU]/mL

## 2022-09-03 LAB — CBC
HCT: 37.6 % (ref 36.0–46.0)
Hemoglobin: 12.6 g/dL (ref 12.0–15.0)
MCHC: 33.5 g/dL (ref 30.0–36.0)
MCV: 90.9 fl (ref 78.0–100.0)
Platelets: 299 10*3/uL (ref 150.0–400.0)
RBC: 4.14 Mil/uL (ref 3.87–5.11)
RDW: 13.1 % (ref 11.5–15.5)
WBC: 13 10*3/uL — ABNORMAL HIGH (ref 4.0–10.5)

## 2022-09-03 LAB — TSH: TSH: 4.17 u[IU]/mL (ref 0.35–5.50)

## 2022-09-03 LAB — T4, FREE: Free T4: 0.7 ng/dL (ref 0.60–1.60)

## 2022-09-03 LAB — T3, FREE: T3, Free: 3.3 pg/mL (ref 2.3–4.2)

## 2022-09-03 NOTE — Assessment & Plan Note (Signed)
Patient congratulated on pregnancy.  Recommend that she follow a healthy diet and follow-up with OB/GYN as scheduled.  Recommend she continue prenatal vitamin and not take any other over-the-counter or prescription medication unless she discusses with OB/GYN first.  Patient reports understanding.

## 2022-09-03 NOTE — Assessment & Plan Note (Signed)
Chronic, labs ordered for further evaluation.  Further recommendations may be made based upon his results.

## 2022-09-03 NOTE — Progress Notes (Signed)
   Established Patient Office Visit  Subjective   Patient ID: Krystal Humphrey, female    DOB: [birth year]  Age: 36 y.o. MRN: [id number]  Chief Complaint  Patient presents with   New Patient (Initial Visit)    Establish care   Patient transferring care as previous provider left her practice. She recently found out she was pregnant, not sure of gestational age.  Has ultrasound scheduled for 09/09/2022.  Has irregular periods at baseline. LMP 07/19/22. Only medication she is taking currently is prenatal. Is having nausea, but no emesis. Reports chronic cold intolerance.  Not experiencing constipation.  Per chart review no evidence of previous anemia or thyroid dysfunction.      Review of Systems  Constitutional:  Positive for malaise/fatigue. Negative for fever.       (+) Cold intolerance  Respiratory:  Negative for shortness of breath.   Cardiovascular:  Negative for chest pain.      Objective:     BP 122/64   Pulse 67   Temp 98.1 F (36.7 C) (Oral)   Ht 5\' 8"  (1.727 m)   Wt 196 lb 2 oz (89 kg)   LMP 07/19/2022 (Exact Date)   SpO2 99%   BMI 29.82 kg/m    Physical Exam Vitals reviewed.  Constitutional:      General: She is not in acute distress.    Appearance: Normal appearance.  HENT:     Head: Normocephalic and atraumatic.  Neck:     Vascular: No carotid bruit.  Cardiovascular:     Rate and Rhythm: Normal rate and regular rhythm.     Pulses: Normal pulses.     Heart sounds: Normal heart sounds.  Pulmonary:     Effort: Pulmonary effort is normal.     Breath sounds: Normal breath sounds.  Skin:    General: Skin is warm and dry.  Neurological:     General: No focal deficit present.     Mental Status: She is alert and oriented to person, place, and time.  Psychiatric:        Mood and Affect: Mood normal.        Behavior: Behavior normal.        Judgment: Judgment normal.      No results found for any visits on 09/03/22.    The ASCVD Risk score (Arnett DK,  et al., 2019) failed to calculate for the following reasons:   The 2019 ASCVD risk score is only valid for ages 90 to 38    Assessment & Plan:   Problem List Items Addressed This Visit   None Visit Diagnoses     Pregnancy, unspecified gestational age    -  Primary   Relevant Orders   hCG, quantitative, pregnancy   Cold intolerance       Relevant Orders   TSH   T3, free   T4, free   CBC   Basic metabolic panel       Return in about 8 months (around 05/05/2023) for CPE - Return after due date .    Ailene Ards, NP

## 2022-09-20 ENCOUNTER — Inpatient Hospital Stay (HOSPITAL_COMMUNITY)
Admission: AD | Admit: 2022-09-20 | Discharge: 2022-09-20 | Disposition: A | Discharge status: Home / Self Care | Payer: BC Managed Care – PPO | Attending: Obstetrics and Gynecology | Admitting: Obstetrics and Gynecology

## 2022-09-20 ENCOUNTER — Encounter (HOSPITAL_COMMUNITY): Payer: Self-pay

## 2022-09-20 ENCOUNTER — Inpatient Hospital Stay (HOSPITAL_COMMUNITY): Payer: BC Managed Care – PPO

## 2022-09-20 DIAGNOSIS — Z3A09 9 weeks gestation of pregnancy: Secondary | ICD-10-CM | POA: Insufficient documentation

## 2022-09-20 DIAGNOSIS — K8020 Calculus of gallbladder without cholecystitis without obstruction: Secondary | ICD-10-CM | POA: Insufficient documentation

## 2022-09-20 DIAGNOSIS — R1013 Epigastric pain: Secondary | ICD-10-CM | POA: Insufficient documentation

## 2022-09-20 DIAGNOSIS — O26891 Other specified pregnancy related conditions, first trimester: Secondary | ICD-10-CM | POA: Diagnosis present

## 2022-09-20 DIAGNOSIS — O99611 Diseases of the digestive system complicating pregnancy, first trimester: Secondary | ICD-10-CM | POA: Diagnosis not present

## 2022-09-20 DIAGNOSIS — O26611 Liver and biliary tract disorders in pregnancy, first trimester: Secondary | ICD-10-CM | POA: Diagnosis not present

## 2022-09-20 LAB — COMPREHENSIVE METABOLIC PANEL
ALT: 16 U/L (ref 0–44)
AST: 35 U/L (ref 15–41)
Albumin: 3.5 g/dL (ref 3.5–5.0)
Alkaline Phosphatase: 45 U/L (ref 38–126)
Anion gap: 7 (ref 5–15)
BUN: 8 mg/dL (ref 6–20)
CO2: 24 mmol/L (ref 22–32)
Calcium: 8.7 mg/dL — ABNORMAL LOW (ref 8.9–10.3)
Chloride: 105 mmol/L (ref 98–111)
Creatinine, Ser: 0.64 mg/dL (ref 0.44–1.00)
GFR, Estimated: >60 mL/min (ref >60)
Glucose, Bld: 76 mg/dL (ref 70–99)
Potassium: 4.4 mmol/L (ref 3.5–5.1)
Sodium: 136 mmol/L (ref 135–145)
Total Bilirubin: 0.7 mg/dL (ref 0.3–1.2)
Total Protein: 6.6 g/dL (ref 6.5–8.1)

## 2022-09-20 LAB — URINALYSIS, ROUTINE W REFLEX MICROSCOPIC
Bilirubin Urine: NEGATIVE
Glucose, UA: NEGATIVE mg/dL
Hgb urine dipstick: NEGATIVE
Ketones, ur: NEGATIVE mg/dL
Leukocytes,Ua: NEGATIVE
Nitrite: POSITIVE — AB
Protein, ur: NEGATIVE mg/dL
Specific Gravity, Urine: 1.015 (ref 1.005–1.030)
pH: 7.5 (ref 5.0–8.0)

## 2022-09-20 LAB — CBC WITH DIFFERENTIAL/PLATELET
Abs Immature Granulocytes: 0.08 10*3/uL — ABNORMAL HIGH (ref 0.00–0.07)
Basophils Absolute: 0.1 10*3/uL (ref 0.0–0.1)
Basophils Relative: 1 %
Eosinophils Absolute: 0.1 10*3/uL (ref 0.0–0.5)
Eosinophils Relative: 1 %
HCT: 37.5 % (ref 36.0–46.0)
Hemoglobin: 12.7 g/dL (ref 12.0–15.0)
Immature Granulocytes: 1 %
Lymphocytes Relative: 25 %
Lymphs Abs: 3 10*3/uL (ref 0.7–4.0)
MCH: 30.8 pg (ref 26.0–34.0)
MCHC: 33.9 g/dL (ref 30.0–36.0)
MCV: 91 fL (ref 80.0–100.0)
Monocytes Absolute: 0.7 10*3/uL (ref 0.1–1.0)
Monocytes Relative: 6 %
Neutro Abs: 8.4 10*3/uL — ABNORMAL HIGH (ref 1.7–7.7)
Neutrophils Relative %: 66 %
Platelets: 362 10*3/uL (ref 150–400)
RBC: 4.12 MIL/uL (ref 3.87–5.11)
RDW: 13.1 % (ref 11.5–15.5)
WBC: 12.4 10*3/uL — ABNORMAL HIGH (ref 4.0–10.5)
nRBC: 0 % (ref 0.0–0.2)

## 2022-09-20 LAB — URINALYSIS, MICROSCOPIC (REFLEX)

## 2022-09-20 LAB — LIPASE, BLOOD: Lipase: 25 U/L (ref 11–51)

## 2022-09-20 MED ORDER — LACTATED RINGERS IV BOLUS
1000.0000 mL | Freq: Once | INTRAVENOUS | Status: AC
  Administered 2022-09-20: 1000 mL via INTRAVENOUS

## 2022-09-20 MED ORDER — OXYCODONE-ACETAMINOPHEN 5-325 MG PO TABS: 1.0000 | ORAL_TABLET | Freq: Four times a day (QID) | ORAL | 0 refills | Status: DC | PRN

## 2022-09-20 MED ORDER — ONDANSETRON HCL 4 MG/2ML IJ SOLN
4.0000 mg | Freq: Once | INTRAMUSCULAR | Status: AC
  Administered 2022-09-20: 4 mg via INTRAVENOUS
  Filled 2022-09-20: qty 2

## 2022-09-20 MED ORDER — PANTOPRAZOLE SODIUM 40 MG IV SOLR
40.0000 mg | Freq: Once | INTRAVENOUS | Status: AC
  Administered 2022-09-20: 40 mg via INTRAVENOUS
  Filled 2022-09-20: qty 10

## 2022-09-20 MED ORDER — PROMETHAZINE HCL 25 MG PO TABS: 25.0000 mg | ORAL_TABLET | Freq: Four times a day (QID) | ORAL | 0 refills | Status: DC | PRN

## 2022-09-20 MED ORDER — SODIUM CHLORIDE 0.9 % IV SOLN
25.0000 mg | Freq: Once | INTRAVENOUS | Status: AC
  Administered 2022-09-20: 25 mg via INTRAVENOUS
  Filled 2022-09-20: qty 1

## 2022-09-20 MED ORDER — FAMOTIDINE IN NACL 20-0.9 MG/50ML-% IV SOLN
20.0000 mg | Freq: Once | INTRAVENOUS | Status: AC
  Administered 2022-09-20: 20 mg via INTRAVENOUS
  Filled 2022-09-20: qty 50

## 2022-09-20 NOTE — MAU Provider Note (Signed)
History     CSN: [id number]  Arrival date and time: 09/20/22 1016    Chief Complaint  Patient presents with   Abdominal Pain   Nausea   HPI This is a G4 P1-0-2-1 at 9 weeks and 0 days who presents with abdominal pain that started yesterday.  Pain is located in the epigastric area and is nonradiating.  Pain increased after eating pizza last night.  Really has not changed much since then.  She does have a fair amount of nausea that is increased since her pain increased.  No vomiting.  Denies fevers, chills, chest pain, shortness of breath.  OB History     Gravida  4   Para  1   Term  1   Preterm      AB  2   Living  1      SAB  1   IAB      Ectopic      Multiple      Live Births  1           Past Medical History:  Diagnosis Date   Anxiety    Closed fracture of distal phalanx of finger 08/25/2021   Depression    GERD (gastroesophageal reflux disease)    Hiatal hernia    History of Clostridium difficile colitis    04/ 2011   History of duodenal ulcer    2009   History of esophagitis    History of panic attacks    Nephrolithiasis    bilateral per ct 07-27-2016  L > R  (right side nonobstructive)   Urgency of urination     Past Surgical History:  Procedure Laterality Date   CYSTOSCOPY WITH RETROGRADE PYELOGRAM, URETEROSCOPY AND STENT PLACEMENT Left 07/30/2016   Procedure: CYSTOSCOPY WITH LEFT  RETROGRADE PYELOGRAM AND LEFT STENT PLACEMENT;  Surgeon: Malen Gauze, MD;  Location: [facility];  Service: Urology;  Laterality: Left;   CYSTOSCOPY WITH RETROGRADE PYELOGRAM, URETEROSCOPY AND STENT PLACEMENT Left 08/17/2016   Procedure: CYSTOSCOPY WITH LEFT RETROGRADE  STENT EXCHANGE, URETEROSCOPY AND STONE EXTRACTION, LASER LITHOTRIPSY AND LEFT RETROGRADE PYELOGRAM;  Surgeon: Bjorn Pippin, MD;  Location: [facility];  Service: Urology;  Laterality: Left;   ESOPHAGOGASTRODUODENOSCOPY  last one 03-26-2009   KNEE ARTHROSCOPY Right 03/19/2010    TONSILLECTOMY  child    Family History  Problem Relation Age of Onset   Diabetes Mother    Thyroid disease Mother    Heart attack Father    Diabetes Maternal Aunt    Diabetes Maternal Uncle    COPD Maternal Grandmother    Crohn's disease Maternal Grandmother    Anesthesia problems Neg Hx    Hypotension Neg Hx    Malignant hyperthermia Neg Hx    Pseudochol deficiency Neg Hx     Social History   Tobacco Use   Smoking status: Former    Packs/day: 0.25    Years: 2.00    Total pack years: 0.50    Types: Cigarettes    Quit date: 09/20/2008    Years since quitting: 14.0   Smokeless tobacco: Never  Vaping Use   Vaping Use: Never used  Substance Use Topics   Alcohol use: Yes    Comment: occasional   Drug use: No    Allergies:  Allergies  Allergen Reactions   Metronidazole Nausea And Vomiting and Rash   Mirtazapine Anaphylaxis and Shortness Of Breath   Other Anaphylaxis and Itching   Tramadol Itching   Escitalopram Other (  See Comments)   Lexapro [Escitalopram Oxalate]     hallucinations    Medications Prior to Admission  Medication Sig Dispense Refill Last Dose   Prenatal Vit-Fe Fumarate-FA (PRENATAL PO) Take by mouth.   09/20/2022    Review of Systems Physical Exam   Blood pressure 128/84, pulse 70, temperature 98.2 F (36.8 C), temperature source Oral, resp. rate 16, height 5\' 8"  (1.727 m), weight 91 kg, last menstrual period 07/19/2022, SpO2 100 %.  Physical Exam Vitals and nursing note reviewed.  Constitutional:      Appearance: She is well-developed.  Eyes:     Extraocular Movements: Extraocular movements intact.     Pupils: Pupils are equal, round, and reactive to light.  Cardiovascular:     Rate and Rhythm: Normal rate and regular rhythm.  Pulmonary:     Effort: Pulmonary effort is normal.     Breath sounds: Normal breath sounds.  Abdominal:     General: Abdomen is flat. Bowel sounds are normal.     Palpations: Abdomen is soft.     Tenderness:  There is abdominal tenderness in the right upper quadrant and epigastric area. There is no right CVA tenderness, left CVA tenderness, guarding or rebound. Negative signs include Murphy's sign.  Skin:    General: Skin is warm and dry.     Capillary Refill: Capillary refill takes less than 2 seconds.  Neurological:     General: No focal deficit present.     Mental Status: She is alert and oriented to person, place, and time.    Results for orders placed or performed during the hospital encounter of 09/20/22 (from the past 24 hour(s))  Urinalysis, Routine w reflex microscopic Urine, Clean Catch     Status: Abnormal   Collection Time: 09/20/22 10:53 AM  Result Value Ref Range   Color, Urine YELLOW YELLOW   APPearance CLOUDY (A) CLEAR   Specific Gravity, Urine 1.015 1.005 - 1.030   pH 7.5 5.0 - 8.0   Glucose, UA NEGATIVE NEGATIVE mg/dL   Hgb urine dipstick NEGATIVE NEGATIVE   Bilirubin Urine NEGATIVE NEGATIVE   Ketones, ur NEGATIVE NEGATIVE mg/dL   Protein, ur NEGATIVE NEGATIVE mg/dL   Nitrite POSITIVE (A) NEGATIVE   Leukocytes,Ua NEGATIVE NEGATIVE  Urinalysis, Microscopic (reflex)     Status: Abnormal   Collection Time: 09/20/22 10:53 AM  Result Value Ref Range   RBC / HPF 0-5 0 - 5 RBC/hpf   WBC, UA 0-5 0 - 5 WBC/hpf   Bacteria, UA MANY (A) NONE SEEN   Squamous Epithelial / LPF 6-10 0 - 5   Amorphous Crystal PRESENT   Comprehensive metabolic panel     Status: Abnormal   Collection Time: 09/20/22 11:22 AM  Result Value Ref Range   Sodium 136 135 - 145 mmol/L   Potassium 4.4 3.5 - 5.1 mmol/L   Chloride 105 98 - 111 mmol/L   CO2 24 22 - 32 mmol/L   Glucose, Bld 76 70 - 99 mg/dL   BUN 8 6 - 20 mg/dL   Creatinine, Ser 0.64 0.44 - 1.00 mg/dL   Calcium 8.7 (L) 8.9 - 10.3 mg/dL   Total Protein 6.6 6.5 - 8.1 g/dL   Albumin 3.5 3.5 - 5.0 g/dL   AST 35 15 - 41 U/L   ALT 16 0 - 44 U/L   Alkaline Phosphatase 45 38 - 126 U/L   Total Bilirubin 0.7 0.3 - 1.2 mg/dL   GFR, Estimated >60  >60 mL/min  Anion gap 7 5 - 15  Lipase, blood     Status: None   Collection Time: 09/20/22 11:22 AM  Result Value Ref Range   Lipase 25 11 - 51 U/L  CBC with Differential/Platelet     Status: Abnormal   Collection Time: 09/20/22 11:22 AM  Result Value Ref Range   WBC 12.4 (H) 4.0 - 10.5 K/uL   RBC 4.12 3.87 - 5.11 MIL/uL   Hemoglobin 12.7 12.0 - 15.0 g/dL   HCT 43.1 54.0 - 08.6 %   MCV 91.0 80.0 - 100.0 fL   MCH 30.8 26.0 - 34.0 pg   MCHC 33.9 30.0 - 36.0 g/dL   RDW 76.1 95.0 - 93.2 %   Platelets 362 150 - 400 K/uL   nRBC 0.0 0.0 - 0.2 %   Neutrophils Relative % 66 %   Neutro Abs 8.4 (H) 1.7 - 7.7 K/uL   Lymphocytes Relative 25 %   Lymphs Abs 3.0 0.7 - 4.0 K/uL   Monocytes Relative 6 %   Monocytes Absolute 0.7 0.1 - 1.0 K/uL   Eosinophils Relative 1 %   Eosinophils Absolute 0.1 0.0 - 0.5 K/uL   Basophils Relative 1 %   Basophils Absolute 0.1 0.0 - 0.1 K/uL   Immature Granulocytes 1 %   Abs Immature Granulocytes 0.08 (H) 0.00 - 0.07 K/uL   US Abdomen Limited RUQ (LIVER/GB)  Result Date: 09/20/2022 CLINICAL DATA:  Acute upper abdominal pain. EXAM: ULTRASOUND ABDOMEN LIMITED RIGHT UPPER QUADRANT COMPARISON:  June 30, 2020. FINDINGS: Gallbladder: Cholelithiasis is noted without gallbladder wall thickening or pericholecystic fluid. No sonographic Murphy's sign is noted. Common bile duct: Diameter: 2 mm which is within normal limits. Liver: No focal lesion identified. Within normal limits in parenchymal echogenicity. Portal vein is patent on color Doppler imaging with normal direction of blood flow towards the liver. Other: None. IMPRESSION: Cholelithiasis without evidence of cholecystitis. No other abnormality seen in the right upper quadrant of the abdomen. Electronically Signed   By: Lupita Raider M.D.   On: 09/20/2022 13:40     MAU Course  Procedures  MDM Start IVF - 1L bolus.  CMP, CBC, lipase. Zofran, pepcid.  Assessment and Plan   1. [redacted] weeks gestation of pregnancy    2. Cholelithiasis affecting pregnancy in first trimester, antepartum    Pain and nausea improved. Discussed results. D/C to home.  Refer to general surgery Limited amount of percocet. Phenergan prescribed.  Krystal Humphrey 09/20/2022, 10:59 AM

## 2022-09-20 NOTE — MAU Note (Signed)
Krystal Humphrey is a 36 y.o. at [redacted]w[redacted]d here in MAU reporting: real bad stomach pain (upper abd). Nauseated, couldn't sleep.  Started last yesterday, worse after she ate Still has her gallbladder Onset of complaint: last night Pain score: 7 Vitals:   09/20/22 1027  BP: 138/68  Pulse: 73  Resp: 16  Temp: 98.2 F (36.8 C)  SpO2: 100%      Lab orders placed from triage:  urine

## 2022-09-24 LAB — OB RESULTS CONSOLE RUBELLA ANTIBODY, IGM: Rubella: IMMUNE

## 2022-09-24 LAB — OB RESULTS CONSOLE ANTIBODY SCREEN: Antibody Screen: NEGATIVE

## 2022-09-24 LAB — OB RESULTS CONSOLE HEPATITIS B SURFACE ANTIGEN: Hepatitis B Surface Ag: NEGATIVE

## 2022-09-24 LAB — OB RESULTS CONSOLE ABO/RH: RH Type: POSITIVE

## 2022-09-24 LAB — HEPATITIS C ANTIBODY: HCV Ab: NEGATIVE

## 2022-09-24 LAB — OB RESULTS CONSOLE GC/CHLAMYDIA
Chlamydia: NEGATIVE
Neisseria Gonorrhea: NEGATIVE

## 2022-09-24 LAB — OB RESULTS CONSOLE RPR: RPR: NONREACTIVE

## 2022-09-24 LAB — OB RESULTS CONSOLE HIV ANTIBODY (ROUTINE TESTING): HIV: NONREACTIVE

## 2022-11-15 ENCOUNTER — Inpatient Hospital Stay (HOSPITAL_COMMUNITY)
Admission: AD | Admit: 2022-11-15 | Discharge: 2022-11-15 | Disposition: A | Discharge status: Home / Self Care | Payer: BC Managed Care – PPO | Attending: Obstetrics and Gynecology | Admitting: Obstetrics and Gynecology

## 2022-11-15 ENCOUNTER — Encounter (HOSPITAL_COMMUNITY): Payer: Self-pay | Admitting: Obstetrics and Gynecology

## 2022-11-15 ENCOUNTER — Ambulatory Visit (INDEPENDENT_AMBULATORY_CARE_PROVIDER_SITE_OTHER)
Admission: EM | Admit: 2022-11-15 | Discharge: 2022-11-15 | Disposition: A | Discharge status: Home / Self Care | Payer: BC Managed Care – PPO | Source: Home / Self Care

## 2022-11-15 DIAGNOSIS — O26892 Other specified pregnancy related conditions, second trimester: Secondary | ICD-10-CM

## 2022-11-15 DIAGNOSIS — O99512 Diseases of the respiratory system complicating pregnancy, second trimester: Secondary | ICD-10-CM | POA: Insufficient documentation

## 2022-11-15 DIAGNOSIS — J018 Other acute sinusitis: Secondary | ICD-10-CM | POA: Insufficient documentation

## 2022-11-15 DIAGNOSIS — J0100 Acute maxillary sinusitis, unspecified: Secondary | ICD-10-CM | POA: Diagnosis not present

## 2022-11-15 DIAGNOSIS — R102 Pelvic and perineal pain: Secondary | ICD-10-CM

## 2022-11-15 DIAGNOSIS — Z3A17 17 weeks gestation of pregnancy: Secondary | ICD-10-CM | POA: Insufficient documentation

## 2022-11-15 DIAGNOSIS — R519 Headache, unspecified: Secondary | ICD-10-CM

## 2022-11-15 DIAGNOSIS — K8020 Calculus of gallbladder without cholecystitis without obstruction: Secondary | ICD-10-CM | POA: Insufficient documentation

## 2022-11-15 DIAGNOSIS — O2342 Unspecified infection of urinary tract in pregnancy, second trimester: Secondary | ICD-10-CM | POA: Diagnosis not present

## 2022-11-15 LAB — POCT URINALYSIS DIP (MANUAL ENTRY)
Bilirubin, UA: NEGATIVE
Blood, UA: NEGATIVE
Glucose, UA: NEGATIVE mg/dL
Ketones, POC UA: NEGATIVE mg/dL
Nitrite, UA: NEGATIVE
Protein Ur, POC: 30 mg/dL — AB
Spec Grav, UA: 1.015 (ref 1.010–1.025)
Urobilinogen, UA: 0.2 E.U./dL
pH, UA: 8.5 — AB (ref 5.0–8.0)

## 2022-11-15 LAB — URINALYSIS, ROUTINE W REFLEX MICROSCOPIC
Bilirubin Urine: NEGATIVE
Glucose, UA: NEGATIVE mg/dL
Hgb urine dipstick: NEGATIVE
Ketones, ur: NEGATIVE mg/dL
Leukocytes,Ua: NEGATIVE
Nitrite: POSITIVE — AB
Protein, ur: NEGATIVE mg/dL
Specific Gravity, Urine: 1.01 (ref 1.005–1.030)
pH: 8 (ref 5.0–8.0)

## 2022-11-15 MED ORDER — PSEUDOEPHEDRINE HCL 30 MG PO TABS
60.0000 mg | ORAL_TABLET | Freq: Once | ORAL | Status: AC
  Administered 2022-11-15: 60 mg via ORAL
  Filled 2022-11-15: qty 2

## 2022-11-15 MED ORDER — LACTATED RINGERS IV BOLUS
1000.0000 mL | Freq: Once | INTRAVENOUS | Status: AC
  Administered 2022-11-15: 1000 mL via INTRAVENOUS

## 2022-11-15 MED ORDER — DIPHENHYDRAMINE HCL 50 MG/ML IJ SOLN
25.0000 mg | Freq: Once | INTRAMUSCULAR | Status: AC
  Administered 2022-11-15: 25 mg via INTRAVENOUS
  Filled 2022-11-15: qty 1

## 2022-11-15 MED ORDER — AMOXICILLIN 500 MG PO CAPS: 500.0000 mg | ORAL_CAPSULE | Freq: Three times a day (TID) | ORAL | 0 refills | Status: DC

## 2022-11-15 MED ORDER — PSEUDOEPHEDRINE HCL 30 MG PO TABS: 30.0000 mg | ORAL_TABLET | Freq: Three times a day (TID) | ORAL | 0 refills | Status: DC | PRN

## 2022-11-15 MED ORDER — CYCLOBENZAPRINE HCL 5 MG PO TABS
10.0000 mg | ORAL_TABLET | Freq: Once | ORAL | Status: AC
  Administered 2022-11-15: 10 mg via ORAL
  Filled 2022-11-15: qty 2

## 2022-11-15 MED ORDER — CETIRIZINE HCL 10 MG PO TABS: 10.0000 mg | ORAL_TABLET | Freq: Every day | ORAL | 0 refills | Status: DC

## 2022-11-15 MED ORDER — METOCLOPRAMIDE HCL 5 MG/ML IJ SOLN
10.0000 mg | Freq: Once | INTRAMUSCULAR | Status: AC
  Administered 2022-11-15: 10 mg via INTRAVENOUS
  Filled 2022-11-15: qty 2

## 2022-11-15 MED ORDER — ACETAMINOPHEN-CAFFEINE 500-65 MG PO TABS
2.0000 | ORAL_TABLET | Freq: Once | ORAL | Status: AC
  Administered 2022-11-15: 2 via ORAL
  Filled 2022-11-15: qty 2

## 2022-11-15 NOTE — MAU Note (Addendum)
Krystal Humphrey is a 36 y.o. at [redacted]w[redacted]d here in MAU reporting: she's having abdominal cramping, congestion, and HA.  Reports has been taking Tylenol, last taken @ 1000 this morning, no relief noted.  Denies VB or LOF. Seen in Urgent Care, states only collected urine specimen & recommended to be further evaluated @ MAU. LMP: NA Onset of complaint: yesterday Pain score: 10 Vitals:   11/15/22 1403  BP: 138/74  Pulse: 89  Resp: 18  Temp: 98.1 F (36.7 C)  SpO2: 100%     FHT:149 bpm Lab orders placed from triage:

## 2022-11-15 NOTE — ED Provider Notes (Signed)
[facility]  Note:  This document was prepared using Conservation officer, historic buildings and may include unintentional dictation errors.  MRN: [id number] DOB: 01-19-1986  Subjective:   Krystal Humphrey is a 36 y.o. female presenting for 8 to 9-day history of acute onset persistent sinus congestion, sinus pressure, occasional coughing.  Has also had bilateral ear fullness.  Started to have sinus headaches yesterday.  She is [redacted] weeks pregnant.  Started to have abdominal cramping, reports that she is concerned about her pregnancy.  No vaginal bleeding, dysuria, urinary frequency.  Patient has not been hydrating as much as she normally does.  She did contact her OBs office that advised monitoring for her symptoms.  Takes prenatal vitamin.    Allergies  Allergen Reactions   Metronidazole Nausea And Vomiting and Rash   Mirtazapine Anaphylaxis and Shortness Of Breath   Other Anaphylaxis and Itching   Tramadol Itching   Escitalopram Other (See Comments)   Lexapro [Escitalopram Oxalate]     hallucinations    Past Medical History:  Diagnosis Date   Anxiety    Closed fracture of distal phalanx of finger 08/25/2021   Depression    GERD (gastroesophageal reflux disease)    Hiatal hernia    History of Clostridium difficile colitis    04/ 2011   History of duodenal ulcer    2009   History of esophagitis    History of panic attacks    Nephrolithiasis    bilateral per ct 07-27-2016  L > R  (right side nonobstructive)   Urgency of urination      Past Surgical History:  Procedure Laterality Date   CYSTOSCOPY WITH RETROGRADE PYELOGRAM, URETEROSCOPY AND STENT PLACEMENT Left 07/30/2016   Procedure: CYSTOSCOPY WITH LEFT  RETROGRADE PYELOGRAM AND LEFT STENT PLACEMENT;  Surgeon: Malen Gauze, MD;  Location: [facility];  Service: Urology;  Laterality: Left;   CYSTOSCOPY WITH RETROGRADE PYELOGRAM, URETEROSCOPY AND STENT PLACEMENT Left 08/17/2016   Procedure: CYSTOSCOPY WITH LEFT  RETROGRADE  STENT EXCHANGE, URETEROSCOPY AND STONE EXTRACTION, LASER LITHOTRIPSY AND LEFT RETROGRADE PYELOGRAM;  Surgeon: Bjorn Pippin, MD;  Location: [facility];  Service: Urology;  Laterality: Left;   ESOPHAGOGASTRODUODENOSCOPY  last one 03-26-2009   KNEE ARTHROSCOPY Right 03/19/2010   TONSILLECTOMY  child    Family History  Problem Relation Age of Onset   Diabetes Mother    Thyroid disease Mother    Heart attack Father    Diabetes Maternal Aunt    Diabetes Maternal Uncle    COPD Maternal Grandmother    Crohn's disease Maternal Grandmother    Anesthesia problems Neg Hx    Hypotension Neg Hx    Malignant hyperthermia Neg Hx    Pseudochol deficiency Neg Hx     Social History   Tobacco Use   Smoking status: Former    Packs/day: 0.25    Years: 2.00    Total pack years: 0.50    Types: Cigarettes    Quit date: 09/20/2008    Years since quitting: 14.1   Smokeless tobacco: Never  Vaping Use   Vaping Use: Never used  Substance Use Topics   Alcohol use: Not Currently   Drug use: No    ROS   Objective:   Vitals: BP (!) 156/86 (BP Location: Right Arm)   Pulse 90   Temp 98.1 F (36.7 C) (Oral)   Resp 20   LMP 07/19/2022 (Exact Date)   SpO2 98%   BP Readings from Last  3 Encounters:  11/15/22 (!) 156/86  09/20/22 127/64  09/03/22 122/64   Physical Exam Constitutional:      General: She is not in acute distress.    Appearance: Normal appearance. She is well-developed and normal weight. She is not ill-appearing, toxic-appearing or diaphoretic.  HENT:     Head: Normocephalic and atraumatic.     Right Ear: Tympanic membrane, ear canal and external ear normal. No drainage or tenderness. No middle ear effusion. There is no impacted cerumen. Tympanic membrane is not erythematous or bulging.     Left Ear: Tympanic membrane, ear canal and external ear normal. No drainage or tenderness.  No middle ear effusion. There is no impacted cerumen. Tympanic membrane is  not erythematous or bulging.     Nose: Congestion present. No rhinorrhea.     Mouth/Throat:     Mouth: Mucous membranes are moist. No oral lesions.     Pharynx: No pharyngeal swelling, oropharyngeal exudate, posterior oropharyngeal erythema or uvula swelling.     Tonsils: No tonsillar exudate or tonsillar abscesses.     Comments: Thick post-nasal drainage overlying pharynx.  Eyes:     General: No scleral icterus.       Right eye: No discharge.        Left eye: No discharge.     Extraocular Movements: Extraocular movements intact.     Right eye: Normal extraocular motion.     Left eye: Normal extraocular motion.     Conjunctiva/sclera: Conjunctivae normal.  Cardiovascular:     Rate and Rhythm: Normal rate and regular rhythm.     Heart sounds: Normal heart sounds. No murmur heard.    No friction rub. No gallop.  Pulmonary:     Effort: Pulmonary effort is normal. No respiratory distress.     Breath sounds: No stridor. No wheezing, rhonchi or rales.  Chest:     Chest wall: No tenderness.  Abdominal:     General: Bowel sounds are normal. There is no distension.     Palpations: Abdomen is soft. There is no mass.     Tenderness: There is no abdominal tenderness. There is no right CVA tenderness, left CVA tenderness, guarding or rebound.  Musculoskeletal:     Cervical back: Normal range of motion and neck supple.  Lymphadenopathy:     Cervical: No cervical adenopathy.  Skin:    General: Skin is warm and dry.  Neurological:     General: No focal deficit present.     Mental Status: She is alert and oriented to person, place, and time.  Psychiatric:        Mood and Affect: Mood normal.        Behavior: Behavior normal.        Thought Content: Thought content normal.        Judgment: Judgment normal.     Results for orders placed or performed during the hospital encounter of 11/15/22 (from the past 24 hour(s))  POCT urinalysis dipstick     Status: Abnormal   Collection Time:  11/15/22 12:53 PM  Result Value Ref Range   Color, UA light yellow (A) yellow   Clarity, UA cloudy (A) clear   Glucose, UA negative negative mg/dL   Bilirubin, UA negative negative   Ketones, POC UA negative negative mg/dL   Spec Grav, UA 5.397 6.734 - 1.025   Blood, UA negative negative   pH, UA 8.5 (A) 5.0 - 8.0   Protein Ur, POC =30 (A) negative mg/dL  Urobilinogen, UA 0.2 0.2 or 1.0 E.U./dL   Nitrite, UA Negative Negative   Leukocytes, UA Trace (A) Negative    Assessment and Plan :   PDMP not reviewed this encounter.  1. Other acute sinusitis, recurrence not specified   2. Acute pelvic pain, female   3. [redacted] weeks gestation of pregnancy     Will start empiric treatment for sinusitis with amoxicillin.  Recommended supportive care otherwise including the use of Zyrtec, Sudafed (unless her high blood pressure returns which has not been the case so far).  No sign of UTI on urinalysis.  Discussed signs and symptoms of complications related to her pregnancy, she reports that she would like to have imaging done and labs.  I redirected her to the maternal admission unit as the more appropriate place for this current workup.  Counseled patient on potential for adverse effects with medications prescribed/recommended today, ER and return-to-clinic precautions discussed, patient verbalized understanding.    Wallis Bamberg, [place] 11/16/22 [phone]

## 2022-11-15 NOTE — MAU Provider Note (Signed)
History     [id number]  Arrival date and time: 11/15/22 1334    Chief Complaint  Patient presents with   Abdominal Pain   Headache   Nasal Congestion     HPI Adreena Willits is a 36 y.o. at [redacted]w[redacted]d who presents for headache, congestion, and abdominal cramping. Reports daily headache for over a week. Has been taking tylenol without relief. Was prescribed reglan by her OB last week - has been taking it without tylenol in the evenings without relief of her headache. Nasal congestion & sinus pain started on Saturday. Went to urgent care prior to coming to MAU. Per urgent care note patient was diagnosed with sinusitis, prescribed amoxil & sudafed. Per patient she was told to come here for evaluation & has not gotten those prescriptions yet.  Denies fever, sore throat, cough, or sick contacts. Does endorse some abdominal cramping & had a sharp pain in her abdomen when getting out of the car. Denies dysuria, vaginal bleeding, or LOF.    OB History     Gravida  4   Para  1   Term  1   Preterm      AB  2   Living  1      SAB  1   IAB      Ectopic      Multiple      Live Births  1           Past Medical History:  Diagnosis Date   Anxiety    Closed fracture of distal phalanx of finger 08/25/2021   Depression    GERD (gastroesophageal reflux disease)    Hiatal hernia    History of Clostridium difficile colitis    04/ 2011   History of duodenal ulcer    2009   History of esophagitis    History of panic attacks    Nephrolithiasis    bilateral per ct 07-27-2016  L > R  (right side nonobstructive)   Urgency of urination     Past Surgical History:  Procedure Laterality Date   CYSTOSCOPY WITH RETROGRADE PYELOGRAM, URETEROSCOPY AND STENT PLACEMENT Left 07/30/2016   Procedure: CYSTOSCOPY WITH LEFT  RETROGRADE PYELOGRAM AND LEFT STENT PLACEMENT;  Surgeon: Malen Gauze, MD;  Location: [facility];  Service: Urology;  Laterality: Left;   CYSTOSCOPY WITH RETROGRADE  PYELOGRAM, URETEROSCOPY AND STENT PLACEMENT Left 08/17/2016   Procedure: CYSTOSCOPY WITH LEFT RETROGRADE  STENT EXCHANGE, URETEROSCOPY AND STONE EXTRACTION, LASER LITHOTRIPSY AND LEFT RETROGRADE PYELOGRAM;  Surgeon: Bjorn Pippin, MD;  Location: [facility];  Service: Urology;  Laterality: Left;   ESOPHAGOGASTRODUODENOSCOPY  last one 03-26-2009   KNEE ARTHROSCOPY Right 03/19/2010   TONSILLECTOMY  child    Family History  Problem Relation Age of Onset   Diabetes Mother    Thyroid disease Mother    Heart attack Father    Diabetes Maternal Aunt    Diabetes Maternal Uncle    COPD Maternal Grandmother    Crohn's disease Maternal Grandmother    Anesthesia problems Neg Hx    Hypotension Neg Hx    Malignant hyperthermia Neg Hx    Pseudochol deficiency Neg Hx     Allergies  Allergen Reactions   Metronidazole Nausea And Vomiting and Rash   Mirtazapine Anaphylaxis and Shortness Of Breath   Tramadol Itching   Escitalopram Other (See Comments)    No current facility-administered medications on file prior to encounter.   Current Outpatient Medications on File Prior to Encounter  Medication Sig Dispense Refill   metoCLOPramide (REGLAN) 10 MG tablet Take 10 mg by mouth 2 (two) times daily as needed.     amoxicillin (AMOXIL) 500 MG capsule Take 1 capsule (500 mg total) by mouth 3 (three) times daily. 21 capsule 0   cetirizine (ZYRTEC ALLERGY) 10 MG tablet Take 1 tablet (10 mg total) by mouth daily. 30 tablet 0   Prenatal Vit-Fe Fumarate-FA (PRENATAL PO) Take by mouth.     pseudoephedrine (SUDAFED) 30 MG tablet Take 1 tablet (30 mg total) by mouth every 8 (eight) hours as needed for congestion. 30 tablet 0     ROS Pertinent positives and negative per HPI, all others reviewed and negative  Physical Exam   BP 138/74 (BP Location: Right Arm)   Pulse 89   Temp 98.1 F (36.7 C) (Oral)   Resp 18   Ht 5\' 8"  (1.727 m)   Wt 96.2 kg   LMP 07/19/2022 (Exact Date)   SpO2 100%    BMI 32.23 kg/m   Patient Vitals for the past 24 hrs:  BP Temp Temp src Pulse Resp SpO2 Height Weight  11/15/22 1403 138/74 98.1 F (36.7 C) Oral 89 18 100 % -- --  11/15/22 1358 -- -- -- -- -- -- 5\' 8"  (1.727 m) 96.2 kg    Physical Exam Vitals and nursing note reviewed. Exam conducted with a chaperone present.  Constitutional:      General: She is not in acute distress.    Appearance: She is well-developed.  HENT:     Head: Normocephalic and atraumatic.  Pulmonary:     Effort: Pulmonary effort is normal. No respiratory distress.  Genitourinary:    Comments: Cervix closed/thick/firm Skin:    General: Skin is warm and dry.  Neurological:     Mental Status: She is alert.      Labs Results for orders placed or performed during the hospital encounter of 11/15/22 (from the past 24 hour(s))  Urinalysis, Routine w reflex microscopic Urine, Clean Catch     Status: Abnormal   Collection Time: 11/15/22  4:16 PM  Result Value Ref Range   Color, Urine YELLOW YELLOW   APPearance HAZY (A) CLEAR   Specific Gravity, Urine 1.010 1.005 - 1.030   pH 8.0 5.0 - 8.0   Glucose, UA NEGATIVE NEGATIVE mg/dL   Hgb urine dipstick NEGATIVE NEGATIVE   Bilirubin Urine NEGATIVE NEGATIVE   Ketones, ur NEGATIVE NEGATIVE mg/dL   Protein, ur NEGATIVE NEGATIVE mg/dL   Nitrite POSITIVE (A) NEGATIVE   Leukocytes,Ua NEGATIVE NEGATIVE   RBC / HPF 0-5 0 - 5 RBC/hpf   WBC, UA 0-5 0 - 5 WBC/hpf   Bacteria, UA MANY (A) NONE SEEN   Squamous Epithelial / LPF 0-5 0 - 5    Imaging No results found.  MAU Course  Procedures Lab Orders         Urinalysis, Routine w reflex microscopic Urine, Clean Catch     Meds ordered this encounter  Medications   acetaminophen-caffeine (EXCEDRIN TENSION HEADACHE) 500-65 MG per tablet 2 tablet   cyclobenzaprine (FLEXERIL) tablet 10 mg   pseudoephedrine (SUDAFED) tablet 60 mg   lactated ringers bolus 1,000 mL   metoCLOPramide (REGLAN) injection 10 mg   diphenhydrAMINE  (BENADRYL) injection 25 mg   Imaging Orders  No imaging studies ordered today    MDM FHT present via doppler  Reviewed note from urgent care provider. Had assessment & diagnosed with sinusitis. Urine dip showed trace leuks -  urine culture in process  Cervix closed in MAU. Sharp pain likely round ligament pain.  Headache treated with excedrin tension, flexeril, & sudafed without relief. Given IV fluids, benadryl, & reglan - H/a down to 2/10.   U/a here is positive for nitrites. Patient already prescribed amoxil for sinusitis which should cover her uti Assessment and Plan   1. Acute non-recurrent maxillary sinusitis  -Take antibiotics as prescribed. Start taking sudafed as recommended by urgent care. Given list of OTC meds safe in pregnancy  2. Headache in pregnancy, antepartum, second trimester   3. UTI (urinary tract infection) during pregnancy, second trimester  -Amoxil should cover but urine culture is pending  4. [redacted] weeks gestation of pregnancy      Judeth Horn, NP 11/15/22 6:04 PM

## 2022-11-15 NOTE — ED Triage Notes (Signed)
Pt c/o nasal congestion, minimal cough, HA-sx started yesterday except HA x 1 week-abd cramping x today-states she is [redacted] weeks pregnant-NAD-steady gait

## 2022-11-15 NOTE — Discharge Instructions (Signed)

## 2022-11-17 ENCOUNTER — Telehealth (HOSPITAL_COMMUNITY): Payer: Self-pay | Admitting: Emergency Medicine

## 2022-11-17 LAB — URINE CULTURE: Culture: >=100000 — AB

## 2022-11-17 MED ORDER — CEPHALEXIN 500 MG PO CAPS: 500.0000 mg | ORAL_CAPSULE | Freq: Four times a day (QID) | ORAL | 0 refills | Status: AC

## 2022-11-29 NOTE — L&D Delivery Note (Signed)
DELIVERY NOTE  Pt complete and at +2 station with urge to push. Epidural controlling pain. Pt pushed and delivered a viable female infant in LOA position. Anterior and posterior shoulders spontaneously delivered with next two pushes; body easily followed next. Infant placed on mothers abdomen and bulb suction of mouth and nose performed. Cord was then clamped and cut by FOB. Cord blood obtained, 3VC. Baby had a vigorous spontaneous cry noted. Placenta then delivered at 1812 intact. Fundal massage performed and pitocin per protocol. Fundus firm. The following lacerations were noted: left periurethral. Repaired in routine fashion with 3-0 monocryl, EBL 250cc.  Mother and baby stable. Counts correct   Infant time: 7 Gender: female, desires circ Placenta time: 1812 Apgars: 9/9 Weight: pending skin-to-skin

## 2023-02-02 ENCOUNTER — Encounter: Payer: Medicaid Other | Attending: Obstetrics and Gynecology | Admitting: Registered"

## 2023-02-02 ENCOUNTER — Encounter: Payer: Self-pay | Admitting: Registered"

## 2023-02-02 DIAGNOSIS — Z713 Dietary counseling and surveillance: Secondary | ICD-10-CM | POA: Insufficient documentation

## 2023-02-02 DIAGNOSIS — Z3A00 Weeks of gestation of pregnancy not specified: Secondary | ICD-10-CM | POA: Diagnosis not present

## 2023-02-02 DIAGNOSIS — O24410 Gestational diabetes mellitus in pregnancy, diet controlled: Secondary | ICD-10-CM | POA: Insufficient documentation

## 2023-02-02 DIAGNOSIS — O24419 Gestational diabetes mellitus in pregnancy, unspecified control: Secondary | ICD-10-CM | POA: Insufficient documentation

## 2023-02-02 HISTORY — DX: Gestational diabetes mellitus in pregnancy, unspecified control: O24.419

## 2023-02-02 NOTE — Progress Notes (Signed)
The following learning objectives were met by the patient during this course:   States the definition of Gestational Diabetes States why dietary management is important in controlling blood glucose Describes the effects each nutrient has on blood glucose levels Demonstrates ability to create a balanced meal plan Demonstrates carbohydrate counting  States when to check blood glucose levels Demonstrates proper blood glucose monitoring techniques States the effect of stress and exercise on blood glucose levels States the importance of limiting caffeine and abstaining from alcohol and smoking   Blood glucose monitor given: None. Patient has meter and checking blood sugar prior to class.   Patient instructed to monitor glucose levels: FBS: 60 - <95; 1 hour: <140; 2 hour: <120   Patient received handouts: Nutrition Diabetes and Pregnancy, including carb counting list and glucose log sheet   Patient will be seen for follow-up as needed.

## 2023-02-08 NOTE — Progress Notes (Signed)
Left prior to being seen. 

## 2023-02-26 ENCOUNTER — Encounter (HOSPITAL_COMMUNITY): Payer: Self-pay | Admitting: Obstetrics and Gynecology

## 2023-02-26 ENCOUNTER — Other Ambulatory Visit: Payer: Self-pay

## 2023-02-26 ENCOUNTER — Inpatient Hospital Stay (HOSPITAL_COMMUNITY)
Admission: AD | Admit: 2023-02-26 | Discharge: 2023-02-26 | Disposition: A | Discharge status: Home / Self Care | Payer: Medicaid Other | Attending: Obstetrics and Gynecology | Admitting: Obstetrics and Gynecology

## 2023-02-26 DIAGNOSIS — Z3A31 31 weeks gestation of pregnancy: Secondary | ICD-10-CM | POA: Insufficient documentation

## 2023-02-26 DIAGNOSIS — O218 Other vomiting complicating pregnancy: Secondary | ICD-10-CM | POA: Diagnosis present

## 2023-02-26 DIAGNOSIS — K529 Noninfective gastroenteritis and colitis, unspecified: Secondary | ICD-10-CM | POA: Diagnosis not present

## 2023-02-26 DIAGNOSIS — O26893 Other specified pregnancy related conditions, third trimester: Secondary | ICD-10-CM | POA: Diagnosis not present

## 2023-02-26 LAB — URINALYSIS, ROUTINE W REFLEX MICROSCOPIC
Bilirubin Urine: NEGATIVE
Glucose, UA: NEGATIVE mg/dL
Hgb urine dipstick: NEGATIVE
Ketones, ur: NEGATIVE mg/dL
Leukocytes,Ua: NEGATIVE
Nitrite: NEGATIVE
Protein, ur: 30 mg/dL — AB
Specific Gravity, Urine: 1.016 (ref 1.005–1.030)
Squamous Epithelial / HPF: >50 /HPF (ref 0–5)
pH: 5 (ref 5.0–8.0)

## 2023-02-26 MED ORDER — SODIUM CHLORIDE 0.9 % IV SOLN
25.0000 mg | Freq: Once | INTRAVENOUS | Status: AC
  Administered 2023-02-26: 25 mg via INTRAVENOUS
  Filled 2023-02-26: qty 1

## 2023-02-26 MED ORDER — LACTATED RINGERS IV BOLUS
1000.0000 mL | Freq: Once | INTRAVENOUS | Status: AC
  Administered 2023-02-26: 1000 mL via INTRAVENOUS

## 2023-02-26 MED ORDER — PROMETHAZINE HCL 25 MG PO TABS: 25.0000 mg | ORAL_TABLET | Freq: Four times a day (QID) | ORAL | 0 refills | Status: DC | PRN

## 2023-02-26 MED ORDER — LOPERAMIDE HCL 2 MG PO TABS: 2.0000 mg | ORAL_TABLET | Freq: Four times a day (QID) | ORAL | 0 refills | Status: DC | PRN

## 2023-02-26 MED ORDER — LOPERAMIDE HCL 2 MG PO CAPS
4.0000 mg | ORAL_CAPSULE | Freq: Once | ORAL | Status: AC
  Administered 2023-02-26: 4 mg via ORAL
  Filled 2023-02-26: qty 2

## 2023-02-26 NOTE — MAU Provider Note (Signed)
History     [id number]  Arrival date and time: 02/26/23 2010    Chief Complaint  Patient presents with   Diarrhea   Emesis     HPI Krystal Humphrey is a 37 y.o. at [redacted]w[redacted]d by LMP who presents for n/v/d. Symptoms started this morning. Denies sick contacts. Has vomited twice today and continues to be nauseated despite taking zofran a few hours ago. Reports countless episodes of watery stools throughout the day. Has not been able to keep food down. Hasn't taken anything for diarrhea. Denies fever, abdominal pain, vaginal bleeding, LOF, or hematochezia. Reports good fetal movement.    Review of outside prenatal records from: [facility]. Low lying placenta resolved. A2GDM on metformin.    OB History     Gravida  4   Para  1   Term  1   Preterm      AB  2   Living  1      SAB  1   IAB  1   Ectopic      Multiple      Live Births  1           Past Medical History:  Diagnosis Date   Anxiety    Closed fracture of distal phalanx of finger 08/25/2021   Depression    GERD (gastroesophageal reflux disease)    Hiatal hernia    History of Clostridium difficile colitis    04/ 2011   History of duodenal ulcer    2009   History of esophagitis    History of panic attacks    Nephrolithiasis    bilateral per ct 07-27-2016  L > R  (right side nonobstructive)   Urgency of urination     Past Surgical History:  Procedure Laterality Date   CYSTOSCOPY WITH RETROGRADE PYELOGRAM, URETEROSCOPY AND STENT PLACEMENT Left 07/30/2016   Procedure: CYSTOSCOPY WITH LEFT  RETROGRADE PYELOGRAM AND LEFT STENT PLACEMENT;  Surgeon: Cleon Gustin, MD;  Location: [facility];  Service: Urology;  Laterality: Left;   CYSTOSCOPY WITH RETROGRADE PYELOGRAM, URETEROSCOPY AND STENT PLACEMENT Left 08/17/2016   Procedure: CYSTOSCOPY WITH LEFT RETROGRADE  STENT EXCHANGE, URETEROSCOPY AND STONE EXTRACTION, LASER LITHOTRIPSY AND LEFT RETROGRADE PYELOGRAM;  Surgeon: Irine Seal, MD;  Location: [place];  Service: Urology;  Laterality: Left;   ESOPHAGOGASTRODUODENOSCOPY  last one 03-26-2009   KNEE ARTHROSCOPY Right 03/19/2010   TONSILLECTOMY  child    Family History  Problem Relation Age of Onset   Diabetes Mother    Thyroid disease Mother    Heart attack Father    Diabetes Maternal Aunt    Diabetes Maternal Uncle    COPD Maternal Grandmother    Crohn's disease Maternal Grandmother    Anesthesia problems Neg Hx    Hypotension Neg Hx    Malignant hyperthermia Neg Hx    Pseudochol deficiency Neg Hx     Social History   Socioeconomic History   Marital status: Single    Spouse name: Not on file   Number of children: 1   Years of education: 12   Highest education level: Not on file  Occupational History   Occupation: unemployed  Tobacco Use   Smoking status: Former    Packs/day: 0.25    Years: 2.00    Additional pack years: 0.00    Total pack years: 0.50    Types: Cigarettes    Quit date: 09/20/2008    Years since quitting: 14.4   Smokeless tobacco: Never  Vaping Use   Vaping Use: Never used  Substance and Sexual Activity   Alcohol use: Not Currently   Drug use: No   Sexual activity: Yes  Other Topics Concern   Not on file  Social History Narrative   Born and raised in [place], [place]. Currently resides in a house with her boyfriend and daughter. 2 dogs. Fun: Softball, bowl   Denies religious beliefs that would effect health care.    Social Determinants of Health   Financial Resource Strain: Not on file  Food Insecurity: No Food Insecurity (02/02/2023)   Hunger Vital Sign    Worried About Running Out of Food in the Last Year: Never true    Ran Out of Food in the Last Year: Never true  Transportation Needs: Not on file  Physical Activity: Not on file  Stress: Not on file  Social Connections: Not on file  Intimate Partner Violence: Not on file    Allergies  Allergen Reactions   Metronidazole Nausea And Vomiting and Rash   Mirtazapine  Anaphylaxis and Shortness Of Breath   Tramadol Itching   Escitalopram Other (See Comments)    No current facility-administered medications on file prior to encounter.   Current Outpatient Medications on File Prior to Encounter  Medication Sig Dispense Refill   ACCU-CHEK GUIDE test strip USE 1 STRIP TO CHECK GLUCOSE 4 TIMES DAILY     Accu-Chek Softclix Lancets lancets SMARTSIG:1 Topical 4 Times Daily     Blood Glucose Monitoring Suppl (ACCU-CHEK GUIDE ME) w/Device KIT USE TO CHECK GLUCOSE AS DIRECTED 4 TIMES DAILY     metFORMIN (GLUCOPHAGE) 500 MG tablet Take 1 tablet by mouth at bedtime.     ondansetron (ZOFRAN-ODT) 8 MG disintegrating tablet Place 1 tablet 3 times a day by translingual route as needed.     Prenatal Vit-Fe Fumarate-FA (PRENATAL PO) Take by mouth.       ROS Pertinent positives and negative per HPI, all others reviewed and negative  Physical Exam   BP 122/62 (BP Location: Left Arm)   Pulse 81   Temp 98.3 F (36.8 C) (Oral)   Resp 18   Ht 5\' 8"  (1.727 m)   Wt 109.1 kg   LMP 07/19/2022 (Exact Date)   BMI 36.58 kg/m   Patient Vitals for the past 24 hrs:  BP Temp Temp src Pulse Resp Height Weight  02/26/23 2248 122/62 98.3 F (36.8 C) Oral 81 18 -- --  02/26/23 2030 135/77 98.3 F (36.8 C) -- 88 18 5\' 8"  (1.727 m) 109.1 kg    Physical Exam Vitals and nursing note reviewed.  Constitutional:      General: She is not in acute distress.    Appearance: She is well-developed. She is not ill-appearing.  HENT:     Head: Normocephalic and atraumatic.  Eyes:     General: No scleral icterus.       Right eye: No discharge.        Left eye: No discharge.     Conjunctiva/sclera: Conjunctivae normal.  Pulmonary:     Effort: Pulmonary effort is normal. No respiratory distress.  Neurological:     General: No focal deficit present.     Mental Status: She is alert.  Psychiatric:        Mood and Affect: Mood normal.        Behavior: Behavior normal.        FHT Baseline 135, moderate variability, 15x15 accels, no decels Toco: none Cat: 1  Labs  Results for orders placed or performed during the hospital encounter of 02/26/23 (from the past 24 hour(s))  Urinalysis, Routine w reflex microscopic -Urine, Clean Catch     Status: Abnormal   Collection Time: 02/26/23  8:58 PM  Result Value Ref Range   Color, Urine YELLOW YELLOW   APPearance CLOUDY (A) CLEAR   Specific Gravity, Urine 1.016 1.005 - 1.030   pH 5.0 5.0 - 8.0   Glucose, UA NEGATIVE NEGATIVE mg/dL   Hgb urine dipstick NEGATIVE NEGATIVE   Bilirubin Urine NEGATIVE NEGATIVE   Ketones, ur NEGATIVE NEGATIVE mg/dL   Protein, ur 30 (A) NEGATIVE mg/dL   Nitrite NEGATIVE NEGATIVE   Leukocytes,Ua NEGATIVE NEGATIVE   RBC / HPF 0-5 0 - 5 RBC/hpf   WBC, UA 0-5 0 - 5 WBC/hpf   Bacteria, UA RARE (A) NONE SEEN   Squamous Epithelial / HPF >50 0 - 5 /HPF   Mucus PRESENT    Ca Oxalate Crys, UA PRESENT     Imaging No results found.  MAU Course  Procedures Lab Orders         Urinalysis, Routine w reflex microscopic -Urine, Clean Catch    Meds ordered this encounter  Medications   lactated ringers bolus 1,000 mL   promethazine (PHENERGAN) 25 mg in sodium chloride 0.9 % 50 mL IVPB   loperamide (IMODIUM) capsule 4 mg   promethazine (PHENERGAN) 25 MG tablet    Sig: Take 1 tablet (25 mg total) by mouth every 6 (six) hours as needed for nausea or vomiting.    Dispense:  30 tablet    Refill:  0    Order Specific Question:   Supervising Provider    Answer:   Donnamae Jude [[id number]]   loperamide (IMODIUM A-D) 2 MG tablet    Sig: Take 1 tablet (2 mg total) by mouth 4 (four) times daily as needed for diarrhea or loose stools.    Dispense:  30 tablet    Refill:  0    Order Specific Question:   Supervising Provider    Answer:   Donnamae Jude [id number]   Imaging Orders  No imaging studies ordered today    MDM moderate  Assessment and Plan   1. Gastroenteritis presumed infectious   2. [redacted]  weeks gestation of pregnancy    -Patient presents with new onset n/v/d. Treated in MAU with IV phenergan & imodium. No episodes of vomiting or diarrhea during her MAU visit. Able to tolerate crackers. Will d/c home with phenergan rx. Discussed use of imodium at home. Bland diet. Reviewed reasons to return to MAU   #FWB: cat 1 tracing    Dispo: discharged to home in stable condition.   Discharge Instructions     Discharge patient   Complete by: As directed    Discharge disposition: 01-Home or Self Care   Discharge patient date: 02/26/2023       Jorje Guild, NP 02/26/23 11:39 PM  Allergies as of 02/26/2023       Reactions   Metronidazole Nausea And Vomiting, Rash   Mirtazapine Anaphylaxis, Shortness Of Breath   Tramadol Itching   Escitalopram Other (See Comments)        Medication List     STOP taking these medications    cetirizine 10 MG tablet Commonly known as: ZyrTEC Allergy   metoCLOPramide 10 MG tablet Commonly known as: REGLAN       TAKE these medications    Accu-Chek Guide Me w/Device Kit USE TO  CHECK GLUCOSE AS DIRECTED 4 TIMES DAILY   Accu-Chek Guide test strip Generic drug: glucose blood USE 1 STRIP TO CHECK GLUCOSE 4 TIMES DAILY   Accu-Chek Softclix Lancets lancets SMARTSIG:1 Topical 4 Times Daily   loperamide 2 MG tablet Commonly known as: Imodium A-D Take 1 tablet (2 mg total) by mouth 4 (four) times daily as needed for diarrhea or loose stools.   metFORMIN 500 MG tablet Commonly known as: GLUCOPHAGE Take 1 tablet by mouth at bedtime.   ondansetron 8 MG disintegrating tablet Commonly known as: ZOFRAN-ODT Place 1 tablet 3 times a day by translingual route as needed.   PRENATAL PO Take by mouth.   promethazine 25 MG tablet Commonly known as: PHENERGAN Take 1 tablet (25 mg total) by mouth every 6 (six) hours as needed for nausea or vomiting.

## 2023-02-26 NOTE — Discharge Instructions (Signed)

## 2023-02-26 NOTE — MAU Note (Signed)
.  Krystal Humphrey is a 37 y.o. at [redacted]w[redacted]d here in MAU reporting: woke up with diarrhea  has vomited a few times. Called OB and zofran prescribed. It has not helped still having diarrhea and nausea. C/o abd cramping. LMP:  Onset of complaint: this morning Pain score: 7 Vitals:   02/26/23 2030  BP: 135/77  Pulse: 88  Resp: 18  Temp: 98.3 F (36.8 C)     FHT:135 Lab orders placed from triage:   U/A

## 2023-03-29 LAB — OB RESULTS CONSOLE GBS: GBS: NEGATIVE

## 2023-04-13 ENCOUNTER — Encounter (HOSPITAL_COMMUNITY): Payer: Self-pay | Admitting: Obstetrics and Gynecology

## 2023-04-13 ENCOUNTER — Telehealth (HOSPITAL_COMMUNITY): Payer: Self-pay | Admitting: *Deleted

## 2023-04-13 ENCOUNTER — Inpatient Hospital Stay (HOSPITAL_COMMUNITY)
Admission: AD | Admit: 2023-04-13 | Discharge: 2023-04-13 | Disposition: A | Discharge status: Home / Self Care | Payer: Medicaid Other | Attending: Obstetrics and Gynecology | Admitting: Obstetrics and Gynecology

## 2023-04-13 ENCOUNTER — Other Ambulatory Visit: Payer: Self-pay

## 2023-04-13 DIAGNOSIS — O26893 Other specified pregnancy related conditions, third trimester: Secondary | ICD-10-CM | POA: Diagnosis present

## 2023-04-13 DIAGNOSIS — Z3A38 38 weeks gestation of pregnancy: Secondary | ICD-10-CM | POA: Insufficient documentation

## 2023-04-13 DIAGNOSIS — Z3493 Encounter for supervision of normal pregnancy, unspecified, third trimester: Secondary | ICD-10-CM

## 2023-04-13 DIAGNOSIS — Z3689 Encounter for other specified antenatal screening: Secondary | ICD-10-CM | POA: Diagnosis not present

## 2023-04-13 LAB — POCT FERN TEST: POCT Fern Test: NEGATIVE

## 2023-04-13 NOTE — MAU Note (Signed)
Krystal Humphrey is a 37 y.o. at [redacted]w[redacted]d here in MAU reporting: she having ctx that 6-7 minutes apart and began having wetness and dampness on clothing since 1130 this morning.  Denies VB.  Endorses +FM. LMP: NA Onset of complaint: today Pain score: 6 Vitals:   04/13/23 1655  BP: 129/74  Pulse: 85  Resp: 19  Temp: 98 F (36.7 C)  SpO2: 99%     CWC:[id number] Lab orders placed from triage:   None

## 2023-04-13 NOTE — Telephone Encounter (Signed)
Preadmission screen  

## 2023-04-13 NOTE — MAU Provider Note (Signed)
Event Date/Time   First Provider Initiated Contact with Patient 04/13/23 1756       S: Krystal Humphrey is a 37 y.o. [phone] at [redacted]w[redacted]d  who presents to MAU today complaining of leaking of fluid since 1130 today 04/13/2023. She denies vaginal bleeding. She endorses contractions q 6-8 min. She reports normal fetal movement.    O: BP 128/75   Pulse 85   Temp 98 F (36.7 C) (Oral)   Resp 18   Ht 5\' 8"  (1.727 m)   Wt 114.4 kg   LMP 07/19/2022 (Exact Date)   SpO2 98%   BMI 38.35 kg/m  GENERAL: Well-developed, well-nourished female in no acute distress.  HEAD: Normocephalic, atraumatic.  CHEST: Normal effort of breathing, regular heart rate ABDOMEN: Soft, nontender, gravid PELVIC: Normal external female genitalia. Vagina is pink and rugated. Cervix with normal contour, no lesions. Normal discharge.  Negative pooling.   Cervical exam:  Dilation: 1 Effacement (%): Thick Cervical Position: Posterior Station: -3 Exam by:: Reita Cliche, CNM   Fetal Monitoring: Baseline: 130 Variability: Mod Accelerations: 15 x 15 Decelerations: none Contractions: occasional   A: SIUP at [redacted]w[redacted]d  S/p sterile speculum exam with CNM Intact amniotic sac (neg pooling, neg fern) Cat I tracing Cervix remains 1/thick/-3 per CNM exam 1 hour after initial exam  P: Discharge home in stable condition with return precautions  F/U: Patient is scheduled for IOL on 04/19/2023  Clayton Bibles, MSA, MSN, CNM Certified Nurse Midwife, [facility]

## 2023-04-15 ENCOUNTER — Encounter (HOSPITAL_COMMUNITY): Payer: Self-pay | Admitting: *Deleted

## 2023-04-17 ENCOUNTER — Other Ambulatory Visit: Payer: Self-pay | Admitting: Obstetrics and Gynecology

## 2023-04-17 DIAGNOSIS — O24410 Gestational diabetes mellitus in pregnancy, diet controlled: Secondary | ICD-10-CM

## 2023-04-19 ENCOUNTER — Inpatient Hospital Stay (HOSPITAL_COMMUNITY): Payer: Medicaid Other

## 2023-04-19 ENCOUNTER — Inpatient Hospital Stay (HOSPITAL_COMMUNITY): Payer: Medicaid Other | Admitting: Anesthesiology

## 2023-04-19 ENCOUNTER — Other Ambulatory Visit: Payer: Self-pay

## 2023-04-19 ENCOUNTER — Encounter (HOSPITAL_COMMUNITY): Payer: Self-pay | Admitting: Obstetrics and Gynecology

## 2023-04-19 ENCOUNTER — Inpatient Hospital Stay (HOSPITAL_COMMUNITY)
Admission: RE | Admit: 2023-04-19 | Discharge: 2023-04-21 | DRG: 807 | Disposition: A | Discharge status: Home / Self Care | Payer: Medicaid Other | Attending: Obstetrics and Gynecology | Admitting: Obstetrics and Gynecology

## 2023-04-19 DIAGNOSIS — Z3A39 39 weeks gestation of pregnancy: Secondary | ICD-10-CM

## 2023-04-19 DIAGNOSIS — Z87891 Personal history of nicotine dependence: Secondary | ICD-10-CM | POA: Diagnosis not present

## 2023-04-19 DIAGNOSIS — O24425 Gestational diabetes mellitus in childbirth, controlled by oral hypoglycemic drugs: Principal | ICD-10-CM | POA: Diagnosis present

## 2023-04-19 DIAGNOSIS — O24410 Gestational diabetes mellitus in pregnancy, diet controlled: Secondary | ICD-10-CM

## 2023-04-19 DIAGNOSIS — O24419 Gestational diabetes mellitus in pregnancy, unspecified control: Principal | ICD-10-CM | POA: Diagnosis present

## 2023-04-19 LAB — GLUCOSE, CAPILLARY
Glucose-Capillary: 86 mg/dL (ref 70–99)
Glucose-Capillary: 115 mg/dL — ABNORMAL HIGH (ref 70–99)
Glucose-Capillary: 99 mg/dL (ref 70–99)
Glucose-Capillary: 69 mg/dL — ABNORMAL LOW (ref 70–99)
Glucose-Capillary: 80 mg/dL (ref 70–99)

## 2023-04-19 LAB — CBC
HCT: 35.3 % — ABNORMAL LOW (ref 36.0–46.0)
Hemoglobin: 12.2 g/dL (ref 12.0–15.0)
MCH: 31 pg (ref 26.0–34.0)
MCHC: 34.6 g/dL (ref 30.0–36.0)
MCV: 89.8 fL (ref 80.0–100.0)
Platelets: 240 10*3/uL (ref 150–400)
RBC: 3.93 MIL/uL (ref 3.87–5.11)
RDW: 14.2 % (ref 11.5–15.5)
WBC: 15.4 10*3/uL — ABNORMAL HIGH (ref 4.0–10.5)
nRBC: 0 % (ref 0.0–0.2)

## 2023-04-19 LAB — TYPE AND SCREEN
ABO/RH(D): A POS
Antibody Screen: NEGATIVE

## 2023-04-19 LAB — RPR: RPR Ser Ql: NONREACTIVE

## 2023-04-19 MED ORDER — SIMETHICONE 80 MG PO CHEW: 80.0000 mg | CHEWABLE_TABLET | ORAL | Status: DC | PRN

## 2023-04-19 MED ORDER — FENTANYL CITRATE (PF) 100 MCG/2ML IJ SOLN
INTRAMUSCULAR | Status: DC | PRN
  Administered 2023-04-19: 100 ug via EPIDURAL

## 2023-04-19 MED ORDER — SOD CITRATE-CITRIC ACID 500-334 MG/5ML PO SOLN: 30.0000 mL | ORAL | Status: DC | PRN

## 2023-04-19 MED ORDER — DIPHENHYDRAMINE HCL 25 MG PO CAPS: 25.0000 mg | ORAL_CAPSULE | Freq: Four times a day (QID) | ORAL | Status: DC | PRN

## 2023-04-19 MED ORDER — ZOLPIDEM TARTRATE 5 MG PO TABS: 5.0000 mg | ORAL_TABLET | Freq: Every evening | ORAL | Status: DC | PRN

## 2023-04-19 MED ORDER — WITCH HAZEL-GLYCERIN EX PADS: 1.0000 | MEDICATED_PAD | CUTANEOUS | Status: DC | PRN

## 2023-04-19 MED ORDER — BUPIVACAINE HCL (PF) 0.25 % IJ SOLN
INTRAMUSCULAR | Status: DC | PRN
  Administered 2023-04-19: 8 mL via EPIDURAL

## 2023-04-19 MED ORDER — IBUPROFEN 600 MG PO TABS
600.0000 mg | ORAL_TABLET | Freq: Four times a day (QID) | ORAL | Status: DC
  Administered 2023-04-19 – 2023-04-21 (×7): 600 mg via ORAL
  Filled 2023-04-19 (×7): qty 1

## 2023-04-19 MED ORDER — FENTANYL CITRATE (PF) 100 MCG/2ML IJ SOLN
50.0000 ug | INTRAMUSCULAR | Status: DC | PRN
  Administered 2023-04-19: 50 ug via INTRAVENOUS
  Administered 2023-04-19: 100 ug via INTRAVENOUS
  Filled 2023-04-19 (×2): qty 2

## 2023-04-19 MED ORDER — FENTANYL CITRATE (PF) 100 MCG/2ML IJ SOLN
INTRAMUSCULAR | Status: AC
  Filled 2023-04-19: qty 2

## 2023-04-19 MED ORDER — ACETAMINOPHEN 325 MG PO TABS: 650.0000 mg | ORAL_TABLET | ORAL | Status: DC | PRN

## 2023-04-19 MED ORDER — OXYTOCIN-SODIUM CHLORIDE 30-0.9 UT/500ML-% IV SOLN
2.5000 [IU]/h | INTRAVENOUS | Status: DC
  Administered 2023-04-19: 2.5 [IU]/h via INTRAVENOUS
  Filled 2023-04-19: qty 500

## 2023-04-19 MED ORDER — DIPHENHYDRAMINE HCL 50 MG/ML IJ SOLN: 12.5000 mg | INTRAMUSCULAR | Status: DC | PRN

## 2023-04-19 MED ORDER — LACTATED RINGERS IV SOLN: INTRAVENOUS | Status: DC

## 2023-04-19 MED ORDER — PHENYLEPHRINE 80 MCG/ML (10ML) SYRINGE FOR IV PUSH (FOR BLOOD PRESSURE SUPPORT): 80.0000 ug | PREFILLED_SYRINGE | INTRAVENOUS | Status: DC | PRN

## 2023-04-19 MED ORDER — LACTATED RINGERS IV SOLN
500.0000 mL | INTRAVENOUS | Status: DC | PRN
  Administered 2023-04-19: 500 mL via INTRAVENOUS

## 2023-04-19 MED ORDER — OXYCODONE-ACETAMINOPHEN 5-325 MG PO TABS: 1.0000 | ORAL_TABLET | ORAL | Status: DC | PRN

## 2023-04-19 MED ORDER — LIDOCAINE HCL (PF) 1 % IJ SOLN
INTRAMUSCULAR | Status: DC | PRN
  Administered 2023-04-19 (×2): 4 mL via EPIDURAL

## 2023-04-19 MED ORDER — COCONUT OIL OIL: 1.0000 | TOPICAL_OIL | Status: DC | PRN

## 2023-04-19 MED ORDER — OXYTOCIN-SODIUM CHLORIDE 30-0.9 UT/500ML-% IV SOLN
1.0000 m[IU]/min | INTRAVENOUS | Status: DC
  Administered 2023-04-19: 2 m[IU]/min via INTRAVENOUS

## 2023-04-19 MED ORDER — ONDANSETRON HCL 4 MG/2ML IJ SOLN: 4.0000 mg | INTRAMUSCULAR | Status: DC | PRN

## 2023-04-19 MED ORDER — EPHEDRINE 5 MG/ML INJ: 10.0000 mg | INTRAVENOUS | Status: DC | PRN

## 2023-04-19 MED ORDER — PRENATAL MULTIVITAMIN CH
1.0000 | ORAL_TABLET | Freq: Every day | ORAL | Status: DC
  Administered 2023-04-20 – 2023-04-21 (×2): 1 via ORAL
  Filled 2023-04-19 (×2): qty 1

## 2023-04-19 MED ORDER — LACTATED RINGERS IV SOLN
500.0000 mL | Freq: Once | INTRAVENOUS | Status: AC
  Administered 2023-04-19: 500 mL via INTRAVENOUS

## 2023-04-19 MED ORDER — MISOPROSTOL 25 MCG QUARTER TABLET
25.0000 ug | ORAL_TABLET | ORAL | Status: DC | PRN
  Administered 2023-04-19 (×2): 25 ug via VAGINAL
  Filled 2023-04-19 (×2): qty 1

## 2023-04-19 MED ORDER — LIDOCAINE-EPINEPHRINE (PF) 2 %-1:200000 IJ SOLN
INTRAMUSCULAR | Status: DC | PRN
  Administered 2023-04-19 (×2): 5 mL via EPIDURAL

## 2023-04-19 MED ORDER — BENZOCAINE-MENTHOL 20-0.5 % EX AERO
1.0000 | INHALATION_SPRAY | CUTANEOUS | Status: DC | PRN
  Filled 2023-04-19 (×2): qty 56

## 2023-04-19 MED ORDER — OXYTOCIN BOLUS FROM INFUSION
333.0000 mL | Freq: Once | INTRAVENOUS | Status: AC
  Administered 2023-04-19: 333 mL via INTRAVENOUS

## 2023-04-19 MED ORDER — TERBUTALINE SULFATE 1 MG/ML IJ SOLN: 0.2500 mg | Freq: Once | INTRAMUSCULAR | Status: DC | PRN

## 2023-04-19 MED ORDER — ACETAMINOPHEN 325 MG PO TABS
650.0000 mg | ORAL_TABLET | ORAL | Status: DC | PRN
  Administered 2023-04-20: 650 mg via ORAL
  Filled 2023-04-19: qty 2

## 2023-04-19 MED ORDER — ONDANSETRON HCL 4 MG PO TABS: 4.0000 mg | ORAL_TABLET | ORAL | Status: DC | PRN

## 2023-04-19 MED ORDER — TETANUS-DIPHTH-ACELL PERTUSSIS 5-2.5-18.5 LF-MCG/0.5 IM SUSY: 0.5000 mL | PREFILLED_SYRINGE | Freq: Once | INTRAMUSCULAR | Status: DC

## 2023-04-19 MED ORDER — LIDOCAINE HCL (PF) 1 % IJ SOLN: 30.0000 mL | INTRAMUSCULAR | Status: DC | PRN

## 2023-04-19 MED ORDER — SENNOSIDES-DOCUSATE SODIUM 8.6-50 MG PO TABS
2.0000 | ORAL_TABLET | Freq: Every day | ORAL | Status: DC
  Administered 2023-04-20 – 2023-04-21 (×2): 2 via ORAL
  Filled 2023-04-19 (×2): qty 2

## 2023-04-19 MED ORDER — ONDANSETRON HCL 4 MG/2ML IJ SOLN
4.0000 mg | Freq: Four times a day (QID) | INTRAMUSCULAR | Status: DC | PRN
  Administered 2023-04-19: 4 mg via INTRAVENOUS
  Filled 2023-04-19: qty 2

## 2023-04-19 MED ORDER — OXYCODONE-ACETAMINOPHEN 5-325 MG PO TABS: 2.0000 | ORAL_TABLET | ORAL | Status: DC | PRN

## 2023-04-19 MED ORDER — FENTANYL-BUPIVACAINE-NACL 0.5-0.125-0.9 MG/250ML-% EP SOLN
12.0000 mL/h | EPIDURAL | Status: DC | PRN
  Administered 2023-04-19: 12 mL/h via EPIDURAL
  Filled 2023-04-19: qty 250

## 2023-04-19 MED ORDER — DIBUCAINE (PERIANAL) 1 % EX OINT: 1.0000 | TOPICAL_OINTMENT | CUTANEOUS | Status: DC | PRN

## 2023-04-19 NOTE — Plan of Care (Signed)
  Problem: Education: Goal: Knowledge of General Education information will improve Description: Including pain rating scale, medication(s)/side effects and non-pharmacologic comfort measures Outcome: Progressing   Problem: Health Behavior/Discharge Planning: Goal: Ability to manage health-related needs will improve Outcome: Progressing   Problem: Clinical Measurements: Goal: Ability to maintain clinical measurements within normal limits will improve Outcome: Progressing Goal: Will remain free from infection Outcome: Progressing Goal: Diagnostic test results will improve Outcome: Progressing Goal: Respiratory complications will improve Outcome: Progressing Goal: Cardiovascular complication will be avoided Outcome: Progressing   Problem: Activity: Goal: Risk for activity intolerance will decrease Outcome: Progressing   Problem: Nutrition: Goal: Adequate nutrition will be maintained Outcome: Progressing   Problem: Coping: Goal: Level of anxiety will decrease Outcome: Progressing   Problem: Elimination: Goal: Will not experience complications related to bowel motility Outcome: Progressing Goal: Will not experience complications related to urinary retention Outcome: Progressing   Problem: Pain Managment: Goal: General experience of comfort will improve Outcome: Progressing   Problem: Safety: Goal: Ability to remain free from injury will improve Outcome: Progressing   Problem: Skin Integrity: Goal: Risk for impaired skin integrity will decrease Outcome: Progressing   Problem: Education: Goal: Knowledge of Childbirth will improve Outcome: Progressing Goal: Ability to make informed decisions regarding treatment and plan of care will improve Outcome: Progressing Goal: Ability to state and carry out methods to decrease the pain will improve Outcome: Progressing Goal: Individualized Educational Video(s) Outcome: Progressing   Problem: Coping: Goal: Ability to  verbalize concerns and feelings about labor and delivery will improve Outcome: Progressing   Problem: Life Cycle: Goal: Ability to make normal progression through stages of labor will improve Outcome: Progressing Goal: Ability to effectively push during vaginal delivery will improve Outcome: Progressing   Problem: Role Relationship: Goal: Will demonstrate positive interactions with the child Outcome: Progressing   Problem: Safety: Goal: Risk of complications during labor and delivery will decrease Outcome: Progressing   Problem: Pain Management: Goal: Relief or control of pain from uterine contractions will improve Outcome: Progressing   Problem: Education: Goal: Ability to describe self-care measures that may prevent or decrease complications (Diabetes Survival Skills Education) will improve Outcome: Progressing Goal: Individualized Educational Video(s) Outcome: Progressing   Problem: Coping: Goal: Ability to adjust to condition or change in health will improve Outcome: Progressing   Problem: Fluid Volume: Goal: Ability to maintain a balanced intake and output will improve Outcome: Progressing   Problem: Health Behavior/Discharge Planning: Goal: Ability to identify and utilize available resources and services will improve Outcome: Progressing Goal: Ability to manage health-related needs will improve Outcome: Progressing   Problem: Metabolic: Goal: Ability to maintain appropriate glucose levels will improve Outcome: Progressing   Problem: Nutritional: Goal: Maintenance of adequate nutrition will improve Outcome: Progressing Goal: Progress toward achieving an optimal weight will improve Outcome: Progressing   Problem: Skin Integrity: Goal: Risk for impaired skin integrity will decrease Outcome: Progressing   Problem: Tissue Perfusion: Goal: Adequacy of tissue perfusion will improve Outcome: Progressing   

## 2023-04-19 NOTE — Lactation Note (Signed)
This note was copied from a baby's chart. Lactation Consultation Note  Patient Name: Krystal Humphrey [id number] Date: 04/19/2023 Age:35 hours Reason for consult: Initial assessment;Term Mom had baby on the breast BF when LC came into rm. Mom was BF in cross cradle position. Mom had all ready switched breast d/t BF on other breast for 20 min. Mom denies painful latch. LC provided another pillow under mom's boppy pillow to bring baby closer to her instead of mom leaning over baby. Mom encouraged to feed baby 8-12 times/24 hours and with feeding cues. Newborn feeding habits, STS, I&O reviewed. Praised mom for BF so well on her own. Mom stated she really wanted to BF this baby. Encouraged mom to call for assistance or questions.  Maternal Data Does the patient have breastfeeding experience prior to this delivery?: Yes How long did the patient breastfeed?: she BF her daughter and ended pumping and bottle feeding because her nipples where cracked and bleeding. they hurt her so bad.  Feeding    LATCH Score Latch: Grasps breast easily, tongue down, lips flanged, rhythmical sucking.  Audible Swallowing: A few with stimulation  Type of Nipple: Everted at rest and after stimulation  Comfort (Breast/Nipple): Soft / non-tender  Hold (Positioning): No assistance needed to correctly position infant at breast.  LATCH Score: 9   Lactation Tools Discussed/Used    Interventions Interventions: Breast feeding basics reviewed;Skin to skin;Support pillows;LC Services brochure  Discharge    Consult Status Consult Status: Follow-up Date: 04/19/23 Follow-up type: In-patient    Charyl Dancer 04/19/2023, 10:58 PM

## 2023-04-19 NOTE — Anesthesia Procedure Notes (Signed)
Epidural Patient location during procedure: OB Start time: 04/19/2023 11:07 AM End time: 04/19/2023 11:10 AM  Staffing Anesthesiologist: Kaylyn Layer, MD Performed: anesthesiologist   Preanesthetic Checklist Completed: patient identified, IV checked, risks and benefits discussed, monitors and equipment checked, pre-op evaluation and timeout performed  Epidural Patient position: sitting Prep: DuraPrep and site prepped and draped Patient monitoring: continuous pulse ox, blood pressure and heart rate Approach: midline Location: L3-L4 Injection technique: LOR air  Needle:  Needle type: Tuohy  Needle gauge: 17 G Needle length: 9 cm Needle insertion depth: 7 cm Catheter type: closed end flexible Catheter size: 19 Gauge Catheter at skin depth: 12 cm Test dose: negative and Other (1% lidocaine)  Assessment Events: blood not aspirated, no cerebrospinal fluid, injection not painful, no injection resistance, no paresthesia and negative IV test  Additional Notes Patient identified. Risks, benefits, and alternatives discussed with patient including but not limited to bleeding, infection, nerve damage, paralysis, failed block, incomplete pain control, headache, blood pressure changes, nausea, vomiting, reactions to medication, itching, and postpartum back pain. Confirmed with bedside nurse the patient's most recent platelet count. Confirmed with patient that they are not currently taking any anticoagulation, have any bleeding history, or any family history of bleeding disorders. Patient expressed understanding and wished to proceed. All questions were answered. Sterile technique was used throughout the entire procedure. Please see nursing notes for vital signs.   Crisp LOR on first pass. Test dose was given through epidural catheter and negative prior to continuing to dose epidural or start infusion. Warning signs of high block given to the patient including shortness of breath,  tingling/numbness in hands, complete motor block, or any concerning symptoms with instructions to call for help. Patient was given instructions on fall risk and not to get out of bed. All questions and concerns addressed with instructions to call with any issues or inadequate analgesia.  Reason for block:procedure for pain

## 2023-04-19 NOTE — H&P (Signed)
Krystal Humphrey is a 37 y.o. female presenting for scheduled IOL. +FM, denies VB, lOF, has had irr ctx, thinks mucus plug fell out.  PNC c/b 1) GDMA2, BMI 39 - PO metformin 1000mg  QHS. GS at 31wks 2058g/70.3%tile 2) Recurrent UTI - res to Bactrim, resolved with augmentin 3) Low lying placenta - resolved  GBS neg. Occ elevated BP in office but always asymptomatic and PreE labs drawn WNL. So far normotensive in house, denies PreE symptoms.   OB History     Gravida  4   Para  1   Term  1   Preterm  0   AB  2   Living  1      SAB  1   IAB  1   Ectopic  0   Multiple  0   Live Births  1          Past Medical History:  Diagnosis Date   Anxiety    Closed fracture of distal phalanx of finger 08/25/2021   Depression    GERD (gastroesophageal reflux disease)    Gestational diabetes    Hiatal hernia    History of Clostridium difficile colitis    04/ 2011   History of duodenal ulcer    2009   History of esophagitis    History of panic attacks    Nephrolithiasis    bilateral per ct 07-27-2016  L > R  (right side nonobstructive)   Urgency of urination    Past Surgical History:  Procedure Laterality Date   CYSTOSCOPY WITH RETROGRADE PYELOGRAM, URETEROSCOPY AND STENT PLACEMENT Left 07/30/2016   Procedure: CYSTOSCOPY WITH LEFT  RETROGRADE PYELOGRAM AND LEFT STENT PLACEMENT;  Surgeon: Malen Gauze, MD;  Location: [facility];  Service: Urology;  Laterality: Left;   CYSTOSCOPY WITH RETROGRADE PYELOGRAM, URETEROSCOPY AND STENT PLACEMENT Left 08/17/2016   Procedure: CYSTOSCOPY WITH LEFT RETROGRADE  STENT EXCHANGE, URETEROSCOPY AND STONE EXTRACTION, LASER LITHOTRIPSY AND LEFT RETROGRADE PYELOGRAM;  Surgeon: Bjorn Pippin, MD;  Location: [facility];  Service: Urology;  Laterality: Left;   ESOPHAGOGASTRODUODENOSCOPY  last one 03-26-2009   KNEE ARTHROSCOPY Right 03/19/2010   TONSILLECTOMY  child   Family History: family history includes COPD in her maternal  grandmother; Crohn's disease in her maternal grandmother; Diabetes in her maternal aunt, maternal uncle, and mother; Heart attack in her father; Thyroid disease in her mother. Social History:  reports that she quit smoking about 14 years ago. Her smoking use included cigarettes. She has a 0.50 pack-year smoking history. She has never used smokeless tobacco. She reports that she does not currently use alcohol. She reports that she does not use drugs.     Maternal Diabetes: Yes:  Diabetes Type:  Insulin/Medication controlled Genetic Screening: Normal Maternal Ultrasounds/Referrals: Normal Fetal Ultrasounds or other Referrals:  None Maternal Substance Abuse:  No Significant Maternal Medications:  None Significant Maternal Lab Results:  Group B Strep negative Number of Prenatal Visits:greater than 3 verified prenatal visits Other Comments:  None  Review of Systems  Constitutional:  Negative for chills and fever.  Respiratory:  Negative for shortness of breath.   Cardiovascular:  Negative for chest pain, palpitations and leg swelling.  Gastrointestinal:  Negative for abdominal pain and vomiting.  Neurological:  Negative for dizziness, weakness and headaches.  Psychiatric/Behavioral:  Negative for suicidal ideas.    Maternal Medical History:  Contractions: Onset was 1-2 hours ago.   Frequency: irregular.   Fetal activity: Perceived fetal activity is normal.   Prenatal  complications: Cholelithiasis.   No IUGR.   Prenatal Complications - Diabetes: gestational. Diabetes is managed by oral agent (monotherapy).     Dilation: 2 Effacement (%): 60 Station: -2 Exam by:: A.Hairston,RN Blood pressure 121/63, pulse 74, temperature 98.7 F (37.1 C), temperature source Oral, resp. rate 18, height 5\' 8"  (1.727 m), weight 116.7 kg, last menstrual period 07/19/2022, SpO2 100 %. Exam Physical Exam Constitutional:      General: She is not in acute distress.    Appearance: She is well-developed.   HENT:     Head: Normocephalic and atraumatic.  Eyes:     Pupils: Pupils are equal, round, and reactive to light.  Cardiovascular:     Rate and Rhythm: Normal rate and regular rhythm.     Heart sounds: No murmur heard.    No gallop.  Abdominal:     Tenderness: There is no abdominal tenderness. There is no guarding or rebound.  Genitourinary:    Vagina: Normal.  Musculoskeletal:        General: Normal range of motion.     Cervical back: Normal range of motion and neck supple.  Skin:    General: Skin is warm and dry.  Neurological:     Mental Status: She is alert and oriented to person, place, and time.     Prenatal labs: ABO, Rh: --/--/A POS (05/21 0007) Antibody: NEG (05/21 0007) Rubella: Immune (10/27 0000) RPR: Nonreactive (10/27 0000)  HBsAg: Negative (10/27 0000)  HIV: Non-reactive (10/27 0000)  GBS: Negative/-- (04/30 0000)   Cat 1 tracing TOCO q3-76m  Assessment/Plan: This is a 37yo G2P1001 @ 39 1/7 by LMP c/w 9wk TVUS presenting for scheduled IOL for GDMA2, well-controlled. GBS neg. S/p Clear AROM @ 0930, CE 3/70/-2, s/p PV cytotec x2, will initiate pitocin per protocol. Desires epidural, anticipate SVD. Pelvis proven to 9lb6oz   Krystal Humphrey 04/19/2023, 9:14 AM

## 2023-04-19 NOTE — Progress Notes (Signed)
Improved pain control after extra epidural dosing BP 135/74   Pulse 73   Temp 98.1 F (36.7 C) (Axillary)   Resp 18   Ht 5\' 8"  (1.727 m)   Wt 116.7 kg   LMP 07/19/2022 (Exact Date)   SpO2 97%   BMI 39.11 kg/m  CE 6/90/-1 Cat 1 tracing baseline 125, min to mod var, + accels, occ early decel TOCO q2-24m, pitocin at 30mU/min  CBG 86/115/99/69  IOL for GDMA2. Progressing to active labor, anticipate SVD. Continue to titrate pitocin per protocol

## 2023-04-19 NOTE — Anesthesia Preprocedure Evaluation (Signed)
Anesthesia Evaluation  Patient identified by MRN, date of birth, ID band Patient awake    Reviewed: Allergy & Precautions, Patient's Chart, lab work & pertinent test results  History of Anesthesia Complications Negative for: history of anesthetic complications  Airway Mallampati: II  TM Distance: >3 FB Neck ROM: Full    Dental no notable dental hx.    Pulmonary former smoker   Pulmonary exam normal        Cardiovascular negative cardio ROS Normal cardiovascular exam     Neuro/Psych   Anxiety     negative neurological ROS     GI/Hepatic Neg liver ROS, hiatal hernia,GERD  ,,  Endo/Other  diabetes, Gestational, Oral Hypoglycemic Agents    Renal/GU   negative genitourinary   Musculoskeletal negative musculoskeletal ROS (+)    Abdominal   Peds  Hematology negative hematology ROS (+)   Anesthesia Other Findings Day of surgery medications reviewed with patient.  Reproductive/Obstetrics (+) Pregnancy                              Anesthesia Physical Anesthesia Plan  ASA: 2  Anesthesia Plan: Epidural   Post-op Pain Management:    Induction:   PONV Risk Score and Plan: Treatment may vary due to age or medical condition  Airway Management Planned: Natural Airway  Additional Equipment: Fetal Monitoring  Intra-op Plan:   Post-operative Plan:   Informed Consent: I have reviewed the patients History and Physical, chart, labs and discussed the procedure including the risks, benefits and alternatives for the proposed anesthesia with the patient or authorized representative who has indicated his/her understanding and acceptance.       Plan Discussed with:   Anesthesia Plan Comments:          Anesthesia Quick Evaluation

## 2023-04-20 LAB — CBC
HCT: 34.5 % — ABNORMAL LOW (ref 36.0–46.0)
Hemoglobin: 11.9 g/dL — ABNORMAL LOW (ref 12.0–15.0)
MCH: 30.8 pg (ref 26.0–34.0)
MCHC: 34.5 g/dL (ref 30.0–36.0)
MCV: 89.4 fL (ref 80.0–100.0)
Platelets: 226 10*3/uL (ref 150–400)
RBC: 3.86 MIL/uL — ABNORMAL LOW (ref 3.87–5.11)
RDW: 14.2 % (ref 11.5–15.5)
WBC: 17.6 10*3/uL — ABNORMAL HIGH (ref 4.0–10.5)
nRBC: 0 % (ref 0.0–0.2)

## 2023-04-20 LAB — GLUCOSE, CAPILLARY: Glucose-Capillary: 104 mg/dL — ABNORMAL HIGH (ref 70–99)

## 2023-04-20 NOTE — Progress Notes (Signed)
Post Partum Day 1 Subjective: no complaints, up ad lib, voiding, and tolerating PO  Objective: Blood pressure 128/64, pulse 78, temperature 98.1 F (36.7 C), temperature source Oral, resp. rate 20, height 5\' 8"  (1.727 m), weight 116.7 kg, last menstrual period 07/19/2022, SpO2 99 %, unknown if currently breastfeeding.  Physical Exam:  General: alert, cooperative, and appears stated age Lochia: appropriate Uterine Fundus: firm DVT Evaluation: No evidence of DVT seen on physical exam.  Recent Labs    04/19/23 0007 04/20/23 0517  HGB 12.2 11.9*  HCT 35.3* 34.5*    Assessment/Plan: Plan for discharge tomorrow and Breastfeeding Desires neonatal circumcision, R/B/A of procedure discussed at length. Pt understands that neonatal circumcision is not considered medically necessary and is elective. The risks include, but are not limited to bleeding, infection, damage to the penis, development of scar tissue, and having to have it redone at a later date. Pt understands theses risks and wishes to proceed Platelets 68K down from 111K. No evidence of bleeding. WIll recheck in AM   LOS: 1 day   Waynard Reeds, MD 04/20/2023, 1:35 PM

## 2023-04-20 NOTE — Social Work (Signed)
MOB was referred for history of depression/anxiety.  * Referral screened out by Clinical Social Worker because none of the following criteria appear to apply:  ~ History of anxiety/depression during this pregnancy, or of post-partum depression following prior delivery. Per chart review no concerns noted during this pregnancy.  ~ Diagnosis of anxiety and/or depression within last 3 years Per chart review MOB diagnosis dates back prior to May 2021.  OR * MOB's symptoms currently being treated with medication and/or therapy.  Please contact the Clinical Social Worker if needs arise, or by MOB request.  Joshu Furukawa, LCSWA Clinical Social Worker [phone] 

## 2023-04-20 NOTE — Anesthesia Postprocedure Evaluation (Signed)
Anesthesia Post Note  Patient: Krystal Humphrey  Procedure(s) Performed: AN AD HOC LABOR EPIDURAL     Patient location during evaluation: Mother Baby Anesthesia Type: Epidural Level of consciousness: awake and alert Pain management: pain level controlled Vital Signs Assessment: post-procedure vital signs reviewed and stable Respiratory status: spontaneous breathing, nonlabored ventilation and respiratory function stable Cardiovascular status: stable Postop Assessment: no headache, no backache and epidural receding Anesthetic complications: no  No notable events documented.  Last Vitals:  Vitals:   04/20/23 0241 04/20/23 0630  BP: 128/69 120/69  Pulse: 75 68  Resp: 20 20  Temp: 36.7 C 36.7 C  SpO2: 98% 98%    Last Pain:  Vitals:   04/20/23 0630  TempSrc: Oral  PainSc: 6    Pain Goal:                   Trellis Paganini

## 2023-04-20 NOTE — Lactation Note (Signed)
This note was copied from a baby's chart. Lactation Consultation Note  Patient Name: Krystal Humphrey [id number] Date: 04/20/2023 Age:35 hours Reason for consult: Follow-up assessment;Maternal endocrine disorder;Term Baby has low glucose so LC went to assist in getting baby to eat. Baby very sleepy no interested in feeding.  LC discussed hand expression and giving baby colostrum. Mom in agreement. LC spoon fed 1 ml colostrum to baby. Baby wouldn't suck on gloved finger. LC rubbed colostrum on inner cheeks of oral mucosa. Massaged outer cheeks to get baby to swallow.. LC asked mom if she would be interested in pumping, mom stated yes she has brought her DEBP. LC suggested using hospital grade pump. Mom in agreement. Mom shown how to use DEBP & how to disassemble, clean, & reassemble parts. Praised mom for all she is doing. Reported to RN.  Maternal Data Does the patient have breastfeeding experience prior to this delivery?: Yes How long did the patient breastfeed?: she BF her daughter and ended pumping and bottle feeding because her nipples where cracked and bleeding. they hurt her so bad.  Feeding    LATCH Score Latch: Too sleepy or reluctant, no latch achieved, no sucking elicited.  Audible Swallowing: None  Type of Nipple: Everted at rest and after stimulation  Comfort (Breast/Nipple): Soft / non-tender  Hold (Positioning): No assistance needed to correctly position infant at breast.  LATCH Score: 6   Lactation Tools Discussed/Used Tools: Pump Breast pump type: Double-Electric Breast Pump Pump Education: Setup, frequency, and cleaning;Milk Storage Reason for Pumping: supplementation Pumping frequency: q3hr  Interventions Interventions: Skin to skin;Breast massage;Hand express;Breast compression;Expressed milk;DEBP  Discharge    Consult Status Consult Status: Follow-up Date: 04/20/23 Follow-up type: In-patient    Charyl Dancer 04/20/2023, 2:39 AM

## 2023-04-21 LAB — CBC WITH DIFFERENTIAL/PLATELET
Abs Immature Granulocytes: 0.23 10*3/uL — ABNORMAL HIGH (ref 0.00–0.07)
Basophils Absolute: 0.1 10*3/uL (ref 0.0–0.1)
Basophils Relative: 1 %
Eosinophils Absolute: 0.4 10*3/uL (ref 0.0–0.5)
Eosinophils Relative: 3 %
HCT: 33.5 % — ABNORMAL LOW (ref 36.0–46.0)
Hemoglobin: 11.3 g/dL — ABNORMAL LOW (ref 12.0–15.0)
Immature Granulocytes: 2 %
Lymphocytes Relative: 20 %
Lymphs Abs: 2.9 10*3/uL (ref 0.7–4.0)
MCH: 30.2 pg (ref 26.0–34.0)
MCHC: 33.7 g/dL (ref 30.0–36.0)
MCV: 89.6 fL (ref 80.0–100.0)
Monocytes Absolute: 1.1 10*3/uL — ABNORMAL HIGH (ref 0.1–1.0)
Monocytes Relative: 7 %
Neutro Abs: 10.1 10*3/uL — ABNORMAL HIGH (ref 1.7–7.7)
Neutrophils Relative %: 67 %
Platelets: 220 10*3/uL (ref 150–400)
RBC: 3.74 MIL/uL — ABNORMAL LOW (ref 3.87–5.11)
RDW: 14.4 % (ref 11.5–15.5)
WBC: 14.8 10*3/uL — ABNORMAL HIGH (ref 4.0–10.5)
nRBC: 0 % (ref 0.0–0.2)

## 2023-04-21 MED ORDER — IBUPROFEN 600 MG PO TABS: 600.0000 mg | ORAL_TABLET | Freq: Four times a day (QID) | ORAL | 1 refills | Status: AC | PRN

## 2023-04-21 NOTE — Lactation Note (Addendum)
This note was copied from a baby's chart. Lactation Consultation Note  Patient Name: Krystal Humphrey [id number] Date: 04/21/2023 Age:35 hours Reason for consult: Breastfeeding assistance;Mother's request;Term;Infant weight loss (weight loss -3.05). See Birth Parent's MR- GDM2  Birth Parent current feeding preference is breast and formula feeding.   P2, term female infant. Per Birth Parent infant had 6 stools and 2 voids since birth. Birth Parent is breast and formula feeding infant. Infant was crying when LC entered the room. Per Birth Parent, infant refused bottle earlier and currently is cluster feeding. Birth Parent latched infant on her right breast using the football hold position, infant was supplement at the breast, he sustained latch and still breastfeeding after 25 minutes when LC left the room. Per Birth Parent, she used the DEBP three times today. LC reinforced importance of maternal rest, diet and hydration. Birth Parent knows to call RN/LC for further latch assistance if needed.  Birth Parent current feeding plan: 1- Birth Parent will latch infant 1st every feeding,by cues, 8 to 12+ times within 24 hours, skin to skin. 2-Birth Parent will supplement infant after latching infant at the breast with any EBM first  that was pumped and then formula. 3- Birth Parent will continue to use DEBP every 3 hours for 15 minutes on initial setting.  Maternal Data    Feeding Mother's Current Feeding Choice: Breast Milk and Formula  LATCH Score Latch: Grasps breast easily, tongue down, lips flanged, rhythmical sucking.  Audible Swallowing: Spontaneous and intermittent  Type of Nipple: Everted at rest and after stimulation  Comfort (Breast/Nipple): Soft / non-tender  Hold (Positioning): Assistance needed to correctly position infant at breast and maintain latch.  LATCH Score: 9   Lactation Tools Discussed/Used    Interventions Interventions: Education;Support pillows;Adjust  position;Position options;Skin to skin;Assisted with latch;Breast compression  Discharge    Consult Status Consult Status: Follow-up Date: 04/21/23 Follow-up type: In-patient    Frederico Hamman 04/21/2023, 12:31 AM

## 2023-04-21 NOTE — Progress Notes (Signed)
Post Partum Day 2 Subjective: up ad lib, voiding, tolerating PO, + flatus, and lochia mild. She has some cramps as expected when breastfeeding; also bottlefeeding; pain meds helpful .She is bonding well with baby. Denies CP, SOB, HA. She would like discharge to home today   Objective: Blood pressure 129/67, pulse 68, temperature 97.9 F (36.6 C), temperature source Oral, resp. rate 18, height 5\' 8"  (1.727 m), weight 116.7 kg, last menstrual period 07/19/2022, SpO2 100 %, unknown if currently breastfeeding.  Physical Exam:  General: alert, cooperative, and no distress Lochia: appropriate Uterine Fundus: firm Incision: n/a DVT Evaluation: No evidence of DVT seen on physical exam. Calf/Ankle edema is present.  Recent Labs    04/20/23 0517 04/21/23 0553  HGB 11.9* 11.3*  HCT 34.5* 33.5*    Assessment/Plan: Discharge home and Breastfeeding Instructions reviewed    LOS: 2 days   Jake Fuhrmann W Woodley Petzold, DO 04/21/2023, 11:18 AM

## 2023-04-21 NOTE — Discharge Summary (Signed)
Postpartum Discharge Summary  Date of Service updated      Patient Name: Krystal Humphrey DOB: 1986-01-18 MRN: [id number]  Date of admission: 04/19/2023 Delivery date:04/19/2023  Delivering provider: Carlisle Cater  Date of discharge: 04/21/2023  Admitting diagnosis: Gestational diabetes [O24.419] Intrauterine pregnancy: [redacted]w[redacted]d     Secondary diagnosis:  Principal Problem:   Gestational diabetes  Additional problems: none    Discharge diagnosis: Term Pregnancy Delivered and GDM A2                                              Post partum procedures: n/a Augmentation: AROM, Pitocin, and Cytotec Complications: None  Hospital course: Induction of Labor With Vaginal Delivery   37 y.o. yo [id number] at [redacted]w[redacted]d was admitted to the hospital 04/19/2023 for induction of labor.  Indication for induction: A2 DM.  Patient had an labor course complicated byn/a Membrane Rupture Time/Date: 9:23 AM ,04/19/2023   Delivery Method:Vaginal, Spontaneous  Episiotomy: None  Lacerations:  Periurethral  Details of delivery can be found in separate delivery note.  Patient had a postpartum course complicated by n/a. Patient is discharged home 04/21/23.  Newborn Data: Birth date:04/19/2023  Birth time:6:08 PM  Gender:Female  Living status:Living  Apgars:9 ,9  Weight:4260 g   Magnesium Sulfate received: No BMZ received: No  Physical exam  Vitals:   04/20/23 0900 04/20/23 1421 04/20/23 2045 04/21/23 0457  BP: 128/64 132/70 132/68 129/67  Pulse: 78 71 70 68  Resp: 20 18 16 18   Temp: 98.1 F (36.7 C) 98.4 F (36.9 C) 98.2 F (36.8 C) 97.9 F (36.6 C)  TempSrc: Oral Oral Oral Oral  SpO2: 99% 100%    Weight:      Height:       General: alert, cooperative, and no distress Lochia: appropriate Uterine Fundus: firm Incision: N/A DVT Evaluation: No evidence of DVT seen on physical exam. Calf/Ankle edema is present Labs: Lab Results  Component Value Date   WBC 14.8 (H) 04/21/2023   HGB 11.3 (L)  04/21/2023   HCT 33.5 (L) 04/21/2023   MCV 89.6 04/21/2023   PLT 220 04/21/2023      Latest Ref Rng & Units 09/20/2022   11:22 AM  CMP  Glucose 70 - 99 mg/dL 76   BUN 6 - 20 mg/dL 8   Creatinine 1.47 - 8.29 mg/dL 5.62   Sodium 130 - 865 mmol/L 136   Potassium 3.5 - 5.1 mmol/L 4.4   Chloride 98 - 111 mmol/L 105   CO2 22 - 32 mmol/L 24   Calcium 8.9 - 10.3 mg/dL 8.7   Total Protein 6.5 - 8.1 g/dL 6.6   Total Bilirubin 0.3 - 1.2 mg/dL 0.7   Alkaline Phos 38 - 126 U/L 45   AST 15 - 41 U/L 35   ALT 0 - 44 U/L 16    Edinburgh Score:    04/20/2023   12:00 AM  Edinburgh Postnatal Depression Scale Screening Tool  I have been able to laugh and see the funny side of things. 0  I have looked forward with enjoyment to things. 1  I have blamed myself unnecessarily when things went wrong. 1  I have been anxious or worried for no good reason. 1  I have felt scared or panicky for no good reason. 0  Things have been getting on top of me.  0  I have been so unhappy that I have had difficulty sleeping. 0  I have felt sad or miserable. 0  I have been so unhappy that I have been crying. 0  The thought of harming myself has occurred to me. 0  Edinburgh Postnatal Depression Scale Total 3      After visit meds:  Allergies as of 04/21/2023       Reactions   Metronidazole Nausea And Vomiting, Rash   Mirtazapine Anaphylaxis, Shortness Of Breath   Tramadol Itching   Escitalopram Other (See Comments)        Medication List     STOP taking these medications    Accu-Chek Guide Me w/Device Kit   Accu-Chek Guide test strip Generic drug: glucose blood   Accu-Chek Softclix Lancets lancets   loperamide 2 MG tablet Commonly known as: Imodium A-D   metFORMIN 500 MG tablet Commonly known as: GLUCOPHAGE   ondansetron 8 MG disintegrating tablet Commonly known as: ZOFRAN-ODT   promethazine 25 MG tablet Commonly known as: PHENERGAN       TAKE these medications    ibuprofen 600 MG  tablet Commonly known as: ADVIL Take 1 tablet (600 mg total) by mouth every 6 (six) hours as needed for moderate pain or cramping.   PRENATAL PO Take by mouth.         Discharge home in stable condition Infant Feeding: Bottle and Breast Infant Disposition:home with mother Discharge instruction: per After Visit Summary and Postpartum booklet. Activity: Advance as tolerated. Pelvic rest for 6 weeks.  Diet: carb modified diet Anticipated Birth Control: Unsure Postpartum Appointment:6 weeks Additional Postpartum F/U: 2 hour GTT Future Appointments: Future Appointments  Date Time Provider Department Center  05/19/2023 10:00 AM Elenore Paddy, NP [facility] None   Follow up Visit:  Follow-up Information     [facility] Follow up in 6 week(s).   Why: For postpartum visit and 2hr gtt Contact information: [address]  [address] [place] [address] [phone]                     04/21/2023 Cathrine Muster, DO

## 2023-04-21 NOTE — Discharge Instructions (Signed)
Call office with any concerns [phone] 

## 2023-04-26 ENCOUNTER — Telehealth (HOSPITAL_COMMUNITY): Payer: Self-pay | Admitting: *Deleted

## 2023-04-26 NOTE — Telephone Encounter (Signed)
Attempted hospital discharge follow-up call. Left message for patient to return RN call with any questions or concerns. Deforest Hoyles, RN, 04/26/23, [phone]

## 2023-05-19 ENCOUNTER — Ambulatory Visit: Payer: Medicaid Other | Admitting: Nurse Practitioner

## 2023-05-19 VITALS — BP 108/66 | HR 75 | Temp 97.8°F | Ht 68.0 in | Wt 232.4 lb

## 2023-05-19 DIAGNOSIS — Z6835 Body mass index (BMI) 35.0-35.9, adult: Secondary | ICD-10-CM | POA: Diagnosis not present

## 2023-05-19 DIAGNOSIS — Z13220 Encounter for screening for lipoid disorders: Secondary | ICD-10-CM

## 2023-05-19 DIAGNOSIS — J310 Chronic rhinitis: Secondary | ICD-10-CM

## 2023-05-19 DIAGNOSIS — Z0001 Encounter for general adult medical examination with abnormal findings: Secondary | ICD-10-CM

## 2023-05-19 DIAGNOSIS — E669 Obesity, unspecified: Secondary | ICD-10-CM

## 2023-05-19 LAB — CBC
HCT: 43 % (ref 36.0–46.0)
Hemoglobin: 14.1 g/dL (ref 12.0–15.0)
MCHC: 32.8 g/dL (ref 30.0–36.0)
MCV: 89.6 fl (ref 78.0–100.0)
Platelets: 326 10*3/uL (ref 150.0–400.0)
RBC: 4.8 Mil/uL (ref 3.87–5.11)
RDW: 13.8 % (ref 11.5–15.5)
WBC: 11.2 10*3/uL — ABNORMAL HIGH (ref 4.0–10.5)

## 2023-05-19 LAB — COMPREHENSIVE METABOLIC PANEL
ALT: 121 U/L — ABNORMAL HIGH (ref 0–35)
AST: 52 U/L — ABNORMAL HIGH (ref 0–37)
Albumin: 4.3 g/dL (ref 3.5–5.2)
Alkaline Phosphatase: 77 U/L (ref 39–117)
BUN: 16 mg/dL (ref 6–23)
CO2: 27 mEq/L (ref 19–32)
Calcium: 9.4 mg/dL (ref 8.4–10.5)
Chloride: 101 mEq/L (ref 96–112)
Creatinine, Ser: 0.8 mg/dL (ref 0.40–1.20)
GFR: 96.02 mL/min (ref >60.00)
Glucose, Bld: 77 mg/dL (ref 70–99)
Potassium: 4.2 mEq/L (ref 3.5–5.1)
Sodium: 139 mEq/L (ref 135–145)
Total Bilirubin: 0.5 mg/dL (ref 0.2–1.2)
Total Protein: 7.8 g/dL (ref 6.0–8.3)

## 2023-05-19 LAB — TSH: TSH: 1.85 u[IU]/mL (ref 0.35–5.50)

## 2023-05-19 LAB — LIPID PANEL
Cholesterol: 252 mg/dL — ABNORMAL HIGH (ref 0–200)
HDL: 43.9 mg/dL (ref >39.00)
NonHDL: 208.17
Total CHOL/HDL Ratio: 6
Triglycerides: 230 mg/dL — ABNORMAL HIGH (ref 0.0–149.0)
VLDL: 46 mg/dL — ABNORMAL HIGH (ref 0.0–40.0)

## 2023-05-19 LAB — LDL CHOLESTEROL, DIRECT: Direct LDL: 175 mg/dL

## 2023-05-19 NOTE — Assessment & Plan Note (Signed)
Chronic Associated with frequent and chronic Afrin use.  May have component of rebound congestion.,  But patient has deferred this for now.  Consider addition of Xyzal once patient has completed breastfeeding

## 2023-05-19 NOTE — Progress Notes (Signed)
Complete physical exam  Patient: Krystal Humphrey   DOB: [birth year]   37 y.o. Female  MRN: [id number]  Subjective:    Chief Complaint  Patient presents with   Annual Exam    Krystal Humphrey is a 37 y.o. female who presents today for a complete physical exam. Krystal Humphrey reports consuming a general diet. The patient does not participate in regular exercise at present. Krystal Humphrey generally feels well. Krystal Humphrey reports sleeping poorly. Krystal Humphrey does not have additional problems to discuss today.  Krystal Humphrey is postpartum and had a baby approximately 1 month ago.  Krystal Humphrey is currently breast-feeding.    Most recent fall risk assessment:    05/19/2023   10:11 AM  Fall Risk   Falls in the past year? 0  Number falls in past yr: 0  Injury with Fall? 0  Risk for fall due to : No Fall Risks  Follow up Falls evaluation completed     Most recent depression screenings:    05/19/2023   10:12 AM 02/02/2023   12:00 PM  PHQ 2/9 Scores  PHQ - 2 Score 0 0    Vision:Not within last year  and Dental: Current dental problems and Receives regular dental care  Past Medical History:  Diagnosis Date   Anxiety    Closed fracture of distal phalanx of finger 08/25/2021   Depression    GERD (gastroesophageal reflux disease)    Gestational diabetes    Hiatal hernia    History of Clostridium difficile colitis    04/ 2011   History of duodenal ulcer    2009   History of esophagitis    History of panic attacks    Nephrolithiasis    bilateral per ct 07-27-2016  L > R  (right side nonobstructive)   Urgency of urination    Past Surgical History:  Procedure Laterality Date   CYSTOSCOPY WITH RETROGRADE PYELOGRAM, URETEROSCOPY AND STENT PLACEMENT Left 07/30/2016   Procedure: CYSTOSCOPY WITH LEFT  RETROGRADE PYELOGRAM AND LEFT STENT PLACEMENT;  Surgeon: Malen Gauze, MD;  Location: [facility];  Service: Urology;  Laterality: Left;   CYSTOSCOPY WITH RETROGRADE PYELOGRAM, URETEROSCOPY AND STENT PLACEMENT Left 08/17/2016   Procedure: CYSTOSCOPY  WITH LEFT RETROGRADE  STENT EXCHANGE, URETEROSCOPY AND STONE EXTRACTION, LASER LITHOTRIPSY AND LEFT RETROGRADE PYELOGRAM;  Surgeon: Bjorn Pippin, MD;  Location: [facility];  Service: Urology;  Laterality: Left;   ESOPHAGOGASTRODUODENOSCOPY  last one 03-26-2009   KNEE ARTHROSCOPY Right 03/19/2010   TONSILLECTOMY  child   Social History   Socioeconomic History   Marital status: Married    Spouse name: Not on file   Number of children: 1   Years of education: 12   Highest education level: Not on file  Occupational History   Occupation: unemployed  Tobacco Use   Smoking status: Former    Packs/day: 0.25    Years: 2.00    Additional pack years: 0.00    Total pack years: 0.50    Types: Cigarettes    Quit date: 09/20/2008    Years since quitting: 14.6   Smokeless tobacco: Never  Vaping Use   Vaping Use: Never used  Substance and Sexual Activity   Alcohol use: Not Currently   Drug use: No   Sexual activity: Yes    Birth control/protection: Implant  Other Topics Concern   Not on file  Social History Narrative   Born and raised in [place], [place]. Currently resides in a house with her boyfriend and daughter. 2 dogs. Fun:  Softball, bowl   Denies religious beliefs that would effect health care.    Social Determinants of Health   Financial Resource Strain: Not on file  Food Insecurity: No Food Insecurity (04/19/2023)   Hunger Vital Sign    Worried About Running Out of Food in the Last Year: Never true    Ran Out of Food in the Last Year: Never true  Transportation Needs: No Transportation Needs (04/19/2023)   PRAPARE - Administrator, Civil Service (Medical): No    Lack of Transportation (Non-Medical): No  Physical Activity: Not on file  Stress: Not on file  Social Connections: Not on file  Intimate Partner Violence: Not At Risk (04/19/2023)   Humiliation, Afraid, Rape, and Kick questionnaire    Fear of Current or Ex-Partner: No    Emotionally Abused: No     Physically Abused: No    Sexually Abused: No   Family History  Problem Relation Age of Onset   Diabetes Mother    Thyroid disease Mother    Heart attack Father    Diabetes Maternal Aunt    Diabetes Maternal Uncle    COPD Maternal Grandmother    Crohn's disease Maternal Grandmother    Anesthesia problems Neg Hx    Hypotension Neg Hx    Malignant hyperthermia Neg Hx    Pseudochol deficiency Neg Hx       Patient Care Team: Elenore Paddy, NP as PCP - General (Nurse Practitioner)   Outpatient Medications Prior to Visit  Medication Sig   ibuprofen (ADVIL) 600 MG tablet Take 1 tablet (600 mg total) by mouth every 6 (six) hours as needed for moderate pain or cramping.   Prenatal Vit-Fe Fumarate-FA (PRENATAL PO) Take by mouth.   No facility-administered medications prior to visit.    Review of Systems  Constitutional:  Positive for malaise/fatigue. Negative for fever and weight loss.  HENT:  Positive for congestion (chronic, associated with chronic afrin use). Negative for hearing loss.   Eyes:  Negative for blurred vision and double vision.  Respiratory:  Negative for cough and shortness of breath.   Cardiovascular:  Negative for chest pain and palpitations.  Gastrointestinal:  Negative for abdominal pain and blood in stool.  Genitourinary:  Negative for dysuria and hematuria.  Skin:  Negative for itching and rash.  Neurological:  Positive for headaches. Negative for dizziness, seizures and loss of consciousness.  Psychiatric/Behavioral:  Negative for depression and suicidal ideas. The patient is not nervous/anxious.           Objective:     BP 108/66   Pulse 75   Temp 97.8 F (36.6 C) (Temporal)   Ht 5\' 8"  (1.727 m)   Wt 232 lb 6 oz (105.4 kg)   LMP 07/19/2022 (Exact Date)   SpO2 98%   Breastfeeding Yes   BMI 35.33 kg/m  BP Readings from Last 3 Encounters:  05/19/23 108/66  04/21/23 129/67  04/13/23 128/75   Wt Readings from Last 3 Encounters:  05/19/23  232 lb 6 oz (105.4 kg)  04/19/23 257 lb 3.2 oz (116.7 kg)  04/13/23 252 lb 3.2 oz (114.4 kg)         05/19/2023   10:12 AM 02/02/2023   12:00 PM 09/03/2022   10:26 AM  PHQ9 SCORE ONLY  PHQ-9 Total Score 0 0 2     Physical Exam Vitals reviewed. Exam conducted with a chaperone present.  Constitutional:      Appearance: Normal appearance.  HENT:  Head: Normocephalic and atraumatic.     Right Ear: Tympanic membrane, ear canal and external ear normal.     Left Ear: Tympanic membrane, ear canal and external ear normal.  Eyes:     General:        Right eye: No discharge.        Left eye: No discharge.     Extraocular Movements: Extraocular movements intact.     Conjunctiva/sclera: Conjunctivae normal.     Pupils: Pupils are equal, round, and reactive to light.  Neck:     Vascular: No carotid bruit.  Cardiovascular:     Rate and Rhythm: Normal rate and regular rhythm.     Pulses: Normal pulses.     Heart sounds: Normal heart sounds. No murmur heard. Pulmonary:     Effort: Pulmonary effort is normal.     Breath sounds: Normal breath sounds.  Chest:  Breasts:    Breasts are symmetrical.     Right: Normal.     Left: Normal.  Abdominal:     General: Abdomen is flat. Bowel sounds are normal. There is no distension.     Palpations: Abdomen is soft. There is no mass.     Tenderness: There is no abdominal tenderness.  Musculoskeletal:        General: No tenderness.     Cervical back: Neck supple. No muscular tenderness.     Right lower leg: No edema.     Left lower leg: No edema.  Lymphadenopathy:     Cervical: No cervical adenopathy.     Upper Body:     Right upper body: No supraclavicular adenopathy.     Left upper body: No supraclavicular adenopathy.  Skin:    General: Skin is warm and dry.  Neurological:     General: No focal deficit present.     Mental Status: Krystal Humphrey is alert and oriented to person, place, and time.     Motor: No weakness.     Gait: Gait normal.   Psychiatric:        Mood and Affect: Mood normal.        Behavior: Behavior normal.        Judgment: Judgment normal.      No results found for any visits on 05/19/23.     Assessment & Plan:    Routine Health Maintenance and Physical Exam  Immunization History  Administered Date(s) Administered   Influenza Split 09/26/2011   Influenza,inj,Quad PF,6+ Mos 08/29/2019, 08/05/2020   Influenza-Unspecified 09/30/2015   Tdap 09/26/2011, 02/24/2022    Health Maintenance  Topic Date Due   INFLUENZA VACCINE  06/30/2023   PAP SMEAR-Modifier  03/18/2025   DTaP/Tdap/Td (3 - Td or Tdap) 02/25/2032   HIV Screening  Completed   HPV VACCINES  Aged Out   COVID-19 Vaccine  Discontinued   Hepatitis C Screening  Discontinued    Discussed health benefits of physical activity, and encouraged her to engage in regular exercise appropriate for her age and condition.  Problem List Items Addressed This Visit       Respiratory   Rhinitis    Chronic Associated with frequent and chronic Afrin use.  May have component of rebound congestion.,  But patient has deferred this for now.  Consider addition of Xyzal once patient has completed breastfeeding        Other   Obesity    Labs ordered, further recommendations may be made based upon these results.  A1c deferred as patient reports Krystal Humphrey had  gestational diabetes and is going to be following up with her OB/GYN in approximately 2 weeks for further testing now that Krystal Humphrey is postpartum. Patient would like to discuss weight loss medication, we had a shared decision making discussion and we will hold off on starting medication for weight loss until patient has completed breast-feeding.        Relevant Orders   CBC   Comprehensive metabolic panel   Lipid panel   TSH   Encounter for general adult medical examination with abnormal findings - Primary    Chronic, patient up-to-date with recommended screenings and immunizations for female of her age.  We  did discuss upcoming screening recommendations over the next 10 years.  Patient also educated on general healthy living and handout provided.      Relevant Orders   CBC   Comprehensive metabolic panel   Lipid panel   TSH   Return in about 9 months (around 02/16/2024) for F/U with Zenda Herskowitz.     Elenore Paddy, NP

## 2023-05-19 NOTE — Assessment & Plan Note (Addendum)
Labs ordered, further recommendations may be made based upon these results.  A1c deferred as patient reports she had gestational diabetes and is going to be following up with her OB/GYN in approximately 2 weeks for further testing now that she is postpartum. Patient would like to discuss weight loss medication, we had a shared decision making discussion and we will hold off on starting medication for weight loss until patient has completed breast-feeding.

## 2023-05-19 NOTE — Assessment & Plan Note (Signed)
Chronic, patient up-to-date with recommended screenings and immunizations for female of her age.  We did discuss upcoming screening recommendations over the next 10 years.  Patient also educated on general healthy living and handout provided.

## 2023-05-20 ENCOUNTER — Other Ambulatory Visit: Payer: Self-pay | Admitting: Nurse Practitioner

## 2023-05-20 DIAGNOSIS — R748 Abnormal levels of other serum enzymes: Secondary | ICD-10-CM

## 2023-05-27 ENCOUNTER — Encounter: Payer: Self-pay | Admitting: Nurse Practitioner

## 2023-06-11 ENCOUNTER — Telehealth: Payer: Medicaid Other | Admitting: Nurse Practitioner

## 2023-06-11 DIAGNOSIS — J069 Acute upper respiratory infection, unspecified: Secondary | ICD-10-CM

## 2023-06-11 MED ORDER — PREDNISONE 20 MG PO TABS
20.0000 mg | ORAL_TABLET | Freq: Every day | ORAL | 0 refills | Status: AC
Start: 2023-06-11 — End: 2023-06-14

## 2023-06-11 NOTE — Progress Notes (Signed)
Virtual Visit Consent   Krystal Humphrey, you are scheduled for a virtual visit with a [facility] provider today. Just as with appointments in the office, your consent must be obtained to participate. Your consent will be active for this visit and any virtual visit you may have with one of our providers in the next 365 days. If you have a MyChart account, a copy of this consent can be sent to you electronically.  As this is a virtual visit, video technology does not allow for your provider to perform a traditional examination. This may limit your provider's ability to fully assess your condition. If your provider identifies any concerns that need to be evaluated in person or the need to arrange testing (such as labs, EKG, etc.), we will make arrangements to do so. Although advances in technology are sophisticated, we cannot ensure that it will always work on either your end or our end. If the connection with a video visit is poor, the visit may have to be switched to a telephone visit. With either a video or telephone visit, we are not always able to ensure that we have a secure connection.  By engaging in this virtual visit, you consent to the provision of healthcare and authorize for your insurance to be billed (if applicable) for the services provided during this visit. Depending on your insurance coverage, you may receive a charge related to this service.  I need to obtain your verbal consent now. Are you willing to proceed with your visit today? Krystal Humphrey has provided verbal consent on 06/11/2023 for a virtual visit (video or telephone). Krystal Rigg, NP  Date: 06/11/2023 7:18 PM  Virtual Visit via Video Note   I, Krystal Humphrey, connected with  Krystal Humphrey  ([id number], 01-15-1986) on 06/11/23 at  7:15 PM EDT by a video-enabled telemedicine application and verified that I am speaking with the correct person using two identifiers.  Location: Patient: Virtual Visit Location Patient:  Home Provider: Virtual Visit Location Provider: Home Office   I discussed the limitations of evaluation and management by telemedicine and the availability of in person appointments. The patient expressed understanding and agreed to proceed.    History of Present Illness: Krystal Humphrey is a 37 y.o. who identifies as a female who was assigned female at birth, and is being seen today for viral URI with cough and congestion.  Ms. Miskiewicz states over the past 24 hours she has been experiencing pain in her chest with coughing, headache and fatigue. She denies fever but does endorse chills. Has not taken a COVID test as she states she has had covid in the past and her current symptoms are not similar. Currently taking robitussin for cough.   Problems:  Patient Active Problem List   Diagnosis Date Noted   Rhinitis 05/19/2023   Encounter for general adult medical examination with abnormal findings 05/19/2023   Gestational diabetes 04/19/2023   Gestational diabetes mellitus (GDM), antepartum 02/02/2023   Gallstone 11/15/2022   Cold intolerance 09/03/2022   Generalized anxiety disorder 06/20/2019   Obesity 01/07/2015    Allergies:  Allergies  Allergen Reactions   Metronidazole Nausea And Vomiting and Rash   Mirtazapine Anaphylaxis and Shortness Of Breath   Tramadol Itching   Escitalopram Other (See Comments)   Medications:  Current Outpatient Medications:    predniSONE (DELTASONE) 20 MG tablet, Take 1 tablet (20 mg total) by mouth daily with breakfast for 3 days., Disp: 3 tablet, Rfl: 0   ibuprofen (  ADVIL) 600 MG tablet, Take 1 tablet (600 mg total) by mouth every 6 (six) hours as needed for moderate pain or cramping., Disp: 40 tablet, Rfl: 1   Prenatal Vit-Fe Fumarate-FA (PRENATAL PO), Take by mouth., Disp: , Rfl:   Observations/Objective: Patient is well-developed, well-nourished in no acute distress.  Resting comfortably at home.  Head is normocephalic, atraumatic.  No labored  breathing.  Speech is clear and coherent with logical content.  Patient is alert and oriented at baseline.    Assessment and Plan: 1. URI with cough and congestion - predniSONE (DELTASONE) 20 MG tablet; Take 1 tablet (20 mg total) by mouth daily with breakfast for 3 days.  Dispense: 3 tablet; Refill: 0  INSTRUCTIONS: use a humidifier for nasal congestion Drink plenty of fluids, rest and wash hands frequently to avoid the spread of infection Alternate tylenol and Motrin for relief of fever   Follow Up Instructions: I discussed the assessment and treatment plan with the patient. The patient was provided an opportunity to ask questions and all were answered. The patient agreed with the plan and demonstrated an understanding of the instructions.  A copy of instructions were sent to the patient via MyChart unless otherwise noted below.    The patient was advised to call back or seek an in-person evaluation if the symptoms worsen or if the condition fails to improve as anticipated.  Time:  I spent 11 minutes with the patient via telehealth technology discussing the above problems/concerns.    Krystal Rigg, NP

## 2023-06-11 NOTE — Patient Instructions (Signed)
  Krystal Humphrey, thank you for joining Claiborne Rigg, NP for today's virtual visit.  While this provider is not your primary care provider (PCP), if your PCP is located in our provider database this encounter information will be shared with them immediately following your visit.   A [facility] MyChart account gives you access to today's visit and all your visits, tests, and labs performed at [facility] " click here if you don't have a [facility] MyChart account or go to [email or web address]  Consent: (Patient) Krystal Humphrey provided verbal consent for this virtual visit at the beginning of the encounter.  Current Medications:  Current Outpatient Medications:    predniSONE (DELTASONE) 20 MG tablet, Take 1 tablet (20 mg total) by mouth daily with breakfast for 3 days., Disp: 3 tablet, Rfl: 0   ibuprofen (ADVIL) 600 MG tablet, Take 1 tablet (600 mg total) by mouth every 6 (six) hours as needed for moderate pain or cramping., Disp: 40 tablet, Rfl: 1   Prenatal Vit-Fe Fumarate-FA (PRENATAL PO), Take by mouth., Disp: , Rfl:    Medications ordered in this encounter:  Meds ordered this encounter  Medications   predniSONE (DELTASONE) 20 MG tablet    Sig: Take 1 tablet (20 mg total) by mouth daily with breakfast for 3 days.    Dispense:  3 tablet    Refill:  0    Order Specific Question:   Supervising Provider    Answer:   Merrilee Jansky [id number]     *If you need refills on other medications prior to your next appointment, please contact your pharmacy*  Follow-Up: Call back or seek an in-person evaluation if the symptoms worsen or if the condition fails to improve as anticipated.  [facility] [phone]  Other Instructions INSTRUCTIONS: use a humidifier for nasal congestion Drink plenty of fluids, rest and wash hands frequently to avoid the spread of infection Alternate tylenol and Motrin for relief of fever    If you have been instructed to have  an in-person evaluation today at a local Urgent Care facility, please use the link below. It will take you to a list of all of our available [facility], including address, phone number and hours of operation. Please do not delay care.  [facility]  If you or a family member do not have a primary care provider, use the link below to schedule a visit and establish care. When you choose a [facility] primary care physician or advanced practice provider, you gain a long-term partner in health. Find a Primary Care Provider  Learn more about [facility]'s in-office and virtual care options: [facility] - Get Care Now

## 2023-06-16 ENCOUNTER — Emergency Department (HOSPITAL_COMMUNITY)
Admission: EM | Admit: 2023-06-16 | Discharge: 2023-06-16 | Disposition: A | Discharge status: Home / Self Care | Payer: Medicaid Other | Attending: Emergency Medicine | Admitting: Emergency Medicine

## 2023-06-16 ENCOUNTER — Encounter (HOSPITAL_COMMUNITY): Payer: Self-pay | Admitting: Emergency Medicine

## 2023-06-16 ENCOUNTER — Other Ambulatory Visit: Payer: Self-pay

## 2023-06-16 ENCOUNTER — Emergency Department (HOSPITAL_COMMUNITY): Payer: Medicaid Other

## 2023-06-16 DIAGNOSIS — J189 Pneumonia, unspecified organism: Secondary | ICD-10-CM | POA: Insufficient documentation

## 2023-06-16 DIAGNOSIS — R0602 Shortness of breath: Secondary | ICD-10-CM | POA: Diagnosis present

## 2023-06-16 DIAGNOSIS — R531 Weakness: Secondary | ICD-10-CM | POA: Insufficient documentation

## 2023-06-16 DIAGNOSIS — Z1152 Encounter for screening for COVID-19: Secondary | ICD-10-CM | POA: Insufficient documentation

## 2023-06-16 LAB — CBC WITH DIFFERENTIAL/PLATELET
Abs Immature Granulocytes: 0.06 10*3/uL (ref 0.00–0.07)
Basophils Absolute: 0.1 10*3/uL (ref 0.0–0.1)
Basophils Relative: 1 %
Eosinophils Absolute: 0.1 10*3/uL (ref 0.0–0.5)
Eosinophils Relative: 1 %
HCT: 38.7 % (ref 36.0–46.0)
Hemoglobin: 12.8 g/dL (ref 12.0–15.0)
Immature Granulocytes: 1 %
Lymphocytes Relative: 22 %
Lymphs Abs: 2 10*3/uL (ref 0.7–4.0)
MCH: 29.4 pg (ref 26.0–34.0)
MCHC: 33.1 g/dL (ref 30.0–36.0)
MCV: 89 fL (ref 80.0–100.0)
Monocytes Absolute: 0.7 10*3/uL (ref 0.1–1.0)
Monocytes Relative: 8 %
Neutro Abs: 6.1 10*3/uL (ref 1.7–7.7)
Neutrophils Relative %: 67 %
Platelets: 277 10*3/uL (ref 150–400)
RBC: 4.35 MIL/uL (ref 3.87–5.11)
RDW: 13.5 % (ref 11.5–15.5)
WBC: 9 10*3/uL (ref 4.0–10.5)
nRBC: 0 % (ref 0.0–0.2)

## 2023-06-16 LAB — COMPREHENSIVE METABOLIC PANEL
ALT: 75 U/L — ABNORMAL HIGH (ref 0–44)
AST: 51 U/L — ABNORMAL HIGH (ref 15–41)
Albumin: 3.7 g/dL (ref 3.5–5.0)
Alkaline Phosphatase: 90 U/L (ref 38–126)
Anion gap: 8 (ref 5–15)
BUN: 13 mg/dL (ref 6–20)
CO2: 22 mmol/L (ref 22–32)
Calcium: 8.5 mg/dL — ABNORMAL LOW (ref 8.9–10.3)
Chloride: 105 mmol/L (ref 98–111)
Creatinine, Ser: 0.76 mg/dL (ref 0.44–1.00)
GFR, Estimated: >60 mL/min (ref >60)
Glucose, Bld: 76 mg/dL (ref 70–99)
Potassium: 4.5 mmol/L (ref 3.5–5.1)
Sodium: 135 mmol/L (ref 135–145)
Total Bilirubin: 1.8 mg/dL — ABNORMAL HIGH (ref 0.3–1.2)
Total Protein: 7.7 g/dL (ref 6.5–8.1)

## 2023-06-16 LAB — LIPASE, BLOOD: Lipase: 25 U/L (ref 11–51)

## 2023-06-16 LAB — SARS CORONAVIRUS 2 BY RT PCR: SARS Coronavirus 2 by RT PCR: NEGATIVE

## 2023-06-16 MED ORDER — SODIUM CHLORIDE 0.9 % IV BOLUS
1000.0000 mL | Freq: Once | INTRAVENOUS | Status: AC
  Administered 2023-06-16: 1000 mL via INTRAVENOUS

## 2023-06-16 MED ORDER — ALBUTEROL SULFATE HFA 108 (90 BASE) MCG/ACT IN AERS: 2.0000 | INHALATION_SPRAY | RESPIRATORY_TRACT | Status: DC | PRN

## 2023-06-16 MED ORDER — ACETAMINOPHEN 325 MG PO TABS
ORAL_TABLET | ORAL | Status: AC
  Filled 2023-06-16: qty 2

## 2023-06-16 MED ORDER — SODIUM CHLORIDE 0.9 % IV SOLN
1.0000 g | Freq: Once | INTRAVENOUS | Status: AC
  Administered 2023-06-16: 1 g via INTRAVENOUS
  Filled 2023-06-16: qty 10

## 2023-06-16 MED ORDER — ACETAMINOPHEN 325 MG PO TABS
650.0000 mg | ORAL_TABLET | Freq: Once | ORAL | Status: AC
  Administered 2023-06-16: 650 mg via ORAL

## 2023-06-16 MED ORDER — DOXYCYCLINE HYCLATE 100 MG PO TABS: 100.0000 mg | ORAL_TABLET | Freq: Once | ORAL | Status: DC

## 2023-06-16 MED ORDER — AZITHROMYCIN 250 MG PO TABS
500.0000 mg | ORAL_TABLET | Freq: Once | ORAL | Status: AC
  Administered 2023-06-16: 500 mg via ORAL
  Filled 2023-06-16: qty 2

## 2023-06-16 MED ORDER — AZITHROMYCIN 250 MG PO TABS: 250.0000 mg | ORAL_TABLET | Freq: Every day | ORAL | 0 refills | Status: AC

## 2023-06-16 NOTE — ED Provider Notes (Signed)
[facility] EMERGENCY DEPARTMENT AT [facility] Provider Note   CSN: [id number] Arrival date & time: 06/16/23  1527     History  Chief Complaint  Patient presents with   Shortness of Breath    Krystal Humphrey is a 37 y.o. female who is 2 months post-partum presenting to ED with cough, fever, ongoing for 7 days.  Went to UC twice and was told likely a virus.  Had 2 negative covid/flu swabs at UC.  Had xray but does not know results.  Continues having significant nightly fevers, weakness, coughing.  No sick contacts in house.  Former smoker but quit.  No nausea/vomiting/diarrhea.  Hx of gallstones.  HPI     Home Medications Prior to Admission medications   Medication Sig Start Date End Date Taking? Authorizing Provider  azithromycin (ZITHROMAX) 250 MG tablet Take 1 tablet (250 mg total) by mouth daily for 4 days. Take first 2 tablets together, then 1 every day until finished. 06/17/23 06/21/23 Yes Aidee Latimore, Kermit Balo, MD  ibuprofen (ADVIL) 600 MG tablet Take 1 tablet (600 mg total) by mouth every 6 (six) hours as needed for moderate pain or cramping. 04/21/23   Banga, Sharol Given, DO  Prenatal Vit-Fe Fumarate-FA (PRENATAL PO) Take by mouth.    [provider]      Allergies    Metronidazole, Mirtazapine, Tramadol, and Escitalopram    Review of Systems   Review of Systems  Physical Exam Updated Vital Signs BP (!) 157/146   Pulse 95   Temp (!) 101.3 F (38.5 C) (Oral)   Resp (!) 27   Ht 5\' 8"  (1.727 m)   Wt 105 kg   LMP 06/15/2023 (Exact Date)   SpO2 99%   Breastfeeding Yes   BMI 35.20 kg/m  Physical Exam Constitutional:      General: She is not in acute distress. HENT:     Head: Normocephalic and atraumatic.  Eyes:     Conjunctiva/sclera: Conjunctivae normal.     Pupils: Pupils are equal, round, and reactive to light.  Cardiovascular:     Rate and Rhythm: Normal rate and regular rhythm.  Pulmonary:     Effort: Pulmonary effort is normal. No  respiratory distress.     Breath sounds: Examination of the left-lower field reveals rhonchi. Rhonchi present.  Abdominal:     General: There is no distension.     Tenderness: There is no abdominal tenderness.  Skin:    General: Skin is warm and dry.  Neurological:     General: No focal deficit present.     Mental Status: She is alert. Mental status is at baseline.  Psychiatric:        Mood and Affect: Mood normal.        Behavior: Behavior normal.     ED Results / Procedures / Treatments   Labs (all labs ordered are listed, but only abnormal results are displayed) Labs Reviewed  COMPREHENSIVE METABOLIC PANEL - Abnormal; Notable for the following components:      Result Value   Calcium 8.5 (*)    AST 51 (*)    ALT 75 (*)    Total Bilirubin 1.8 (*)    All other components within normal limits  SARS CORONAVIRUS 2 BY RT PCR  LIPASE, BLOOD  CBC WITH DIFFERENTIAL/PLATELET  CBC WITH DIFFERENTIAL/PLATELET    EKG EKG Interpretation Date/Time:  Thursday June 16 2023 15:37:21 EDT Ventricular Rate:  97 PR Interval:  132 QRS Duration:  78 QT Interval:  332 QTC Calculation: 422 R Axis:   65  Text Interpretation: Sinus rhythm Baseline wander in lead(s) V3 Confirmed by Alvester Chou [phone]) on 06/16/2023 4:18:40 PM  Radiology DG Chest Port 1 View  Result Date: 06/16/2023 CLINICAL DATA:  Cough. EXAM: PORTABLE CHEST 1 VIEW COMPARISON:  February 11, 2018 FINDINGS: The heart size and mediastinal contours are within normal limits. Mild to moderate severity patchy multifocal infiltrates are seen involving the right upper lobe, mid left lung field and bilateral lung bases. No pleural effusion or pneumothorax is identified. The visualized skeletal structures are unremarkable. IMPRESSION: Mild to moderate severity patchy, bilateral multifocal infiltrates. Electronically Signed   By: Aram Candela M.D.   On: 06/16/2023 16:18    Procedures Procedures    Medications Ordered in  ED Medications  albuterol (VENTOLIN HFA) 108 (90 Base) MCG/ACT inhaler 2 puff (has no administration in time range)  acetaminophen (TYLENOL) 325 MG tablet (has no administration in time range)  sodium chloride 0.9 % bolus 1,000 mL (0 mLs Intravenous Stopped 06/16/23 1838)  cefTRIAXone (ROCEPHIN) 1 g in sodium chloride 0.9 % 100 mL IVPB (0 g Intravenous Stopped 06/16/23 1839)  azithromycin (ZITHROMAX) tablet 500 mg (500 mg Oral Given 06/16/23 1721)  acetaminophen (TYLENOL) tablet 650 mg (650 mg Oral Given 06/16/23 1839)    ED Course/ Medical Decision Making/ A&P Clinical Course as of 06/16/23 2345  Thu Jun 16, 2023  1810 Patient reassessed.  Remains stable on room air.  We discussed the risk and benefits of outpatient treatment versus inpatient treatment and she would prefer outpatient treatment for pneumonia.  I will treat her with azithromycin given that she is currently breast-feeding, would avoid doxycycline.  I anticipate that her symptoms should significantly quickly improved.  However close return precautions were discussed and she verbalized understanding [MT]    Clinical Course User Index [MT] Addie Alonge, Kermit Balo, MD                             Medical Decision Making Amount and/or Complexity of Data Reviewed Labs: ordered. Radiology: ordered.  Risk OTC drugs. Prescription drug management.   This patient presents to the ED with concern for cough, fever, shortness of breath. This involves an extensive number of treatment options, and is a complaint that carries with it a high risk of complications and morbidity.  The differential diagnosis includes PNA vs pleural effusion vs PE vs viral illness vs other  Additional history obtained from family at bedside  I ordered and personally interpreted labs.  The pertinent results include: No emergent findings  I ordered imaging studies including x-ray of the chest I independently visualized and interpreted imaging which showed multifocal  pneumonia I agree with the radiologist interpretation  The patient was maintained on a cardiac monitor.  I personally viewed and interpreted the cardiac monitored which showed an underlying rhythm of: sinus rhythm and sinus tachycardia  Per my interpretation the patient's ECG shows sinus tachycardia without acute ischemic findings  I ordered medication including IV fluids for hydration and mild tachycardia  I have reviewed the patients home medicines and have made adjustments as needed  Test Considered: Low suspicion for acute PE in this clinical setting  After the interventions noted above, I reevaluated the patient and found that they have: stayed the same   Dispostion:  After consideration of the diagnostic results and the patients response to treatment, I feel that the patent would benefit from close  outpatient follow-up         Final Clinical Impression(s) / ED Diagnoses Final diagnoses:  Pneumonia of both lungs due to infectious organism, unspecified part of lung    Rx / DC Orders ED Discharge Orders          Ordered    azithromycin (ZITHROMAX) 250 MG tablet  Daily       Note to Pharmacy: Initial 500 mg dose given in ER   06/16/23 1811              Terald Sleeper, MD 06/16/23 2345

## 2023-06-16 NOTE — ED Triage Notes (Signed)
Pt endorses fever for a week that will break with tylenol then come right back. Endorses coughing that causes her to vomit. UC gave meds for cough with no relief. Negative covid, flu and strep yesterday. Pt had baby 2 months ago.

## 2023-07-07 ENCOUNTER — Ambulatory Visit: Payer: Medicaid Other | Admitting: Emergency Medicine

## 2023-07-14 ENCOUNTER — Encounter (INDEPENDENT_AMBULATORY_CARE_PROVIDER_SITE_OTHER): Payer: Self-pay

## 2023-07-21 ENCOUNTER — Ambulatory Visit: Payer: Medicaid Other | Admitting: Nurse Practitioner

## 2023-08-12 ENCOUNTER — Ambulatory Visit: Payer: Medicaid Other | Admitting: Nurse Practitioner

## 2023-08-12 VITALS — BP 112/84 | HR 90 | Temp 98.6°F | Ht 68.0 in | Wt 237.4 lb

## 2023-08-12 DIAGNOSIS — R748 Abnormal levels of other serum enzymes: Secondary | ICD-10-CM | POA: Diagnosis not present

## 2023-08-12 DIAGNOSIS — Z0184 Encounter for antibody response examination: Secondary | ICD-10-CM

## 2023-08-12 DIAGNOSIS — Z111 Encounter for screening for respiratory tuberculosis: Secondary | ICD-10-CM

## 2023-08-12 DIAGNOSIS — Z23 Encounter for immunization: Secondary | ICD-10-CM | POA: Diagnosis not present

## 2023-08-12 LAB — BASIC METABOLIC PANEL
BUN: 13 mg/dL (ref 6–23)
CO2: 26 meq/L (ref 19–32)
Calcium: 9.7 mg/dL (ref 8.4–10.5)
Chloride: 102 meq/L (ref 96–112)
Creatinine, Ser: 0.65 mg/dL (ref 0.40–1.20)
GFR: 114.55 mL/min (ref >60.00)
Glucose, Bld: 69 mg/dL — ABNORMAL LOW (ref 70–99)
Potassium: 4 meq/L (ref 3.5–5.1)
Sodium: 137 meq/L (ref 135–145)

## 2023-08-12 NOTE — Assessment & Plan Note (Signed)
Labs ordered, further recommendations may be made based upon these results

## 2023-08-12 NOTE — Assessment & Plan Note (Signed)
Flu shot administered.

## 2023-08-12 NOTE — Progress Notes (Signed)
Established Patient Office Visit  Subjective   Patient ID: Krystal Humphrey, female    DOB: 30-Apr-1986  Age: 37 y.o. MRN: [id number]  Chief Complaint  Patient presents with   Employment Physical    Patient is here to have an employment health screening form completed. She has no acute concerns today.  She is about 4 months postpartum, currently breast-feeding. Per vaccine database reviewed patient has completed hep B vaccine series, I do not have documentation regarding MMR.  She is up-to-date with tetanus vaccine.  She needs to complete tuberculosis screening. She does use corrective glasses, and plans on using these while she is employed.  She needs a vision check today. She reports she recently got her gallbladder out and is under a lifting restriction of no more than 20 pounds until further evaluated by her surgeon.    Review of Systems  Respiratory:  Negative for shortness of breath.   Cardiovascular:  Negative for chest pain.  Neurological:  Negative for dizziness and loss of consciousness.      Objective:     BP 112/84   Pulse 90   Temp 98.6 F (37 C) (Temporal)   Ht 5\' 8"  (1.727 m)   Wt 237 lb 6 oz (107.7 kg)   SpO2 99%   BMI 36.09 kg/m    Physical Exam Vitals reviewed.  Constitutional:      General: She is not in acute distress.    Appearance: Normal appearance.  HENT:     Head: Normocephalic and atraumatic.  Eyes:     General: Vision grossly intact.     Comments: With corrective:ve lenses: OD: 20/20 OU: 20/20 OS: 20/20  Neck:     Vascular: No carotid bruit.  Cardiovascular:     Rate and Rhythm: Normal rate and regular rhythm.     Pulses: Normal pulses.     Heart sounds: Normal heart sounds.  Pulmonary:     Effort: Pulmonary effort is normal.     Breath sounds: Normal breath sounds.  Skin:    General: Skin is warm and dry.  Neurological:     General: No focal deficit present.     Mental Status: She is alert and oriented to person, place, and  time.  Psychiatric:        Mood and Affect: Mood normal.        Behavior: Behavior normal.        Judgment: Judgment normal.      No results found for any visits on 08/12/23.    The ASCVD Risk score (Arnett DK, et al., 2019) failed to calculate for the following reasons:   The 2019 ASCVD risk score is only valid for ages 53 to 63    Assessment & Plan:   Problem List Items Addressed This Visit       Other   Tuberculosis screening - Primary    Labs ordered, further recommendations may be made based upon these results       Relevant Orders   QuantiFERON-TB Gold Plus   Basic metabolic panel   Immunity status testing    Labs ordered, further recommendations may be made based upon these results       Relevant Orders   Measles/Mumps/Rubella Immunity   Basic metabolic panel   Elevated liver enzymes    Labs ordered, further recommendations may be made based upon these results       Need for vaccination    Flu shot administered  Relevant Orders   Flu vaccine trivalent PF, 6mos and older(Flulaval,Afluria,Fluarix,Fluzone) (Completed)    Return in about 9 months (around 05/11/2024) for CPE with Khamarion Bjelland.    Elenore Paddy, NP

## 2023-08-16 LAB — QUANTIFERON-TB GOLD PLUS
Mitogen-NIL: >10 [IU]/mL
NIL: 0.02 [IU]/mL
QuantiFERON-TB Gold Plus: NEGATIVE
TB1-NIL: 0 [IU]/mL
TB2-NIL: 0 [IU]/mL

## 2023-08-16 LAB — MEASLES/MUMPS/RUBELLA IMMUNITY
Mumps IgG: <9 [AU]/ml — ABNORMAL LOW
Rubella: 0.91 {index} — ABNORMAL LOW
Rubeola IgG: 253 [AU]/ml

## 2023-08-18 ENCOUNTER — Ambulatory Visit: Payer: Medicaid Other | Admitting: Nurse Practitioner

## 2023-08-19 ENCOUNTER — Ambulatory Visit: Payer: Medicaid Other | Admitting: Nurse Practitioner

## 2023-08-25 ENCOUNTER — Ambulatory Visit: Payer: Medicaid Other | Admitting: Nurse Practitioner

## 2023-09-01 ENCOUNTER — Ambulatory Visit: Payer: Medicaid Other | Admitting: Nurse Practitioner

## 2023-09-01 ENCOUNTER — Other Ambulatory Visit: Payer: Self-pay | Admitting: Nurse Practitioner

## 2023-09-01 VITALS — BP 112/82 | HR 97 | Temp 98.0°F | Ht 68.0 in | Wt 241.2 lb

## 2023-09-01 DIAGNOSIS — E162 Hypoglycemia, unspecified: Secondary | ICD-10-CM | POA: Diagnosis not present

## 2023-09-01 DIAGNOSIS — R5383 Other fatigue: Secondary | ICD-10-CM

## 2023-09-01 DIAGNOSIS — Z0000 Encounter for general adult medical examination without abnormal findings: Secondary | ICD-10-CM | POA: Insufficient documentation

## 2023-09-01 DIAGNOSIS — R7989 Other specified abnormal findings of blood chemistry: Secondary | ICD-10-CM

## 2023-09-01 LAB — BASIC METABOLIC PANEL
BUN: 17 mg/dL (ref 6–23)
CO2: 25 meq/L (ref 19–32)
Calcium: 9.9 mg/dL (ref 8.4–10.5)
Chloride: 104 meq/L (ref 96–112)
Creatinine, Ser: 0.73 mg/dL (ref 0.40–1.20)
GFR: 106.96 mL/min (ref >60.00)
Glucose, Bld: 107 mg/dL — ABNORMAL HIGH (ref 70–99)
Potassium: 3.9 meq/L (ref 3.5–5.1)
Sodium: 138 meq/L (ref 135–145)

## 2023-09-01 LAB — VITAMIN D 25 HYDROXY (VIT D DEFICIENCY, FRACTURES): VITD: 21.86 ng/mL — ABNORMAL LOW (ref 30.00–100.00)

## 2023-09-01 LAB — TSH: TSH: 0.01 u[IU]/mL — ABNORMAL LOW (ref 0.35–5.50)

## 2023-09-01 LAB — VITAMIN B12: Vitamin B-12: 590 pg/mL (ref 211–911)

## 2023-09-01 LAB — HEMOGLOBIN A1C: Hgb A1c MFr Bld: 5.7 % (ref 4.6–6.5)

## 2023-09-01 NOTE — Assessment & Plan Note (Signed)
We did discuss that CDC recommends MMR vaccine to breast-feeding women when needed.  She does have some antibodies to mumps measles and rubella however rubella antibodies are low which may indicate she would benefit from MMR booster.  We did discuss the low risk of mild symptoms to her breast-feeding infant if she were to get the MMR.  Due to this risk she would like to hold off on getting MMR booster until after she is completed breast-feeding.  I think this is reasonable and have updated her forms at this recommendation.

## 2023-09-01 NOTE — Assessment & Plan Note (Signed)
Etiology unclear Labs ordered, further recommendations may be made based upon these results

## 2023-09-01 NOTE — Assessment & Plan Note (Signed)
Labs ordered, further recommendations may be made based upon these results. 

## 2023-09-01 NOTE — Progress Notes (Signed)
Established Patient Office Visit  Subjective   Patient ID: Krystal Humphrey, female    DOB: 1986/06/22  Age: 37 y.o. MRN: [id number]  Chief Complaint  Patient presents with   Hot Flashes    Forms: She has forms that she needs completed for work.  Part of the form requires vaccine information.  Titers were completed for MMR which identified immunity to rubeola and mumps but borderline immunity to rubella.  Patient is breast-feeding.  She is here to discuss this further.  Hot flashes: Occur intermittently.  She will have a feeling of hot flash and then if she eats something sweet hot flash will subside.  She did identify that on last labs her blood sugar was borderline low at 69.  She is also feeling fatigued generally.    Review of Systems  Constitutional:  Positive for malaise/fatigue.  Respiratory:  Negative for shortness of breath.   Cardiovascular:  Negative for chest pain.      Objective:     BP 112/82   Pulse 97   Temp 98 F (36.7 C) (Temporal)   Ht 5\' 8"  (1.727 m)   Wt 241 lb 4 oz (109.4 kg)   SpO2 98%   BMI 36.68 kg/m    Physical Exam Vitals reviewed.  Constitutional:      General: She is not in acute distress.    Appearance: Normal appearance.  HENT:     Head: Normocephalic and atraumatic.  Neck:     Vascular: No carotid bruit.  Cardiovascular:     Rate and Rhythm: Normal rate and regular rhythm.     Pulses: Normal pulses.     Heart sounds: Normal heart sounds.  Pulmonary:     Effort: Pulmonary effort is normal.     Breath sounds: Normal breath sounds.  Skin:    General: Skin is warm and dry.  Neurological:     General: No focal deficit present.     Mental Status: She is alert and oriented to person, place, and time.  Psychiatric:        Mood and Affect: Mood normal.        Behavior: Behavior normal.        Judgment: Judgment normal.      No results found for any visits on 09/01/23.    The ASCVD Risk score (Arnett DK, et al., 2019) failed  to calculate for the following reasons:   The 2019 ASCVD risk score is only valid for ages 62 to 25    Assessment & Plan:   Problem List Items Addressed This Visit       Endocrine   Hypoglycemia - Primary    Etiology unclear Labs ordered, further recommendations may be made based upon these results       Relevant Orders   Insulin, random   Basic metabolic panel     Other   Fatigue    Labs ordered, further recommendations may be made based upon these results       Relevant Orders   Hemoglobin A1c   Vitamin B12   VITAMIN D 25 Hydroxy (Vit-D Deficiency, Fractures)   TSH   Healthcare maintenance    We did discuss that CDC recommends MMR vaccine to breast-feeding women when needed.  She does have some antibodies to mumps measles and rubella however rubella antibodies are low which may indicate she would benefit from MMR booster.  We did discuss the low risk of mild symptoms to her breast-feeding infant if she were  to get the MMR.  Due to this risk she would like to hold off on getting MMR booster until after she is completed breast-feeding.  I think this is reasonable and have updated her forms at this recommendation.       Return in about 6 months (around 03/01/2024) for F/U with Maralyn Sago.    Elenore Paddy, NP

## 2023-09-02 ENCOUNTER — Other Ambulatory Visit: Payer: Self-pay | Admitting: Nurse Practitioner

## 2023-09-02 ENCOUNTER — Encounter: Payer: Self-pay | Admitting: Nurse Practitioner

## 2023-09-02 DIAGNOSIS — R7989 Other specified abnormal findings of blood chemistry: Secondary | ICD-10-CM

## 2023-09-02 DIAGNOSIS — E162 Hypoglycemia, unspecified: Secondary | ICD-10-CM

## 2023-09-02 LAB — INSULIN, RANDOM: Insulin: 173.6 u[IU]/mL — ABNORMAL HIGH

## 2023-09-05 ENCOUNTER — Telehealth: Payer: Self-pay | Admitting: Nurse Practitioner

## 2023-09-05 DIAGNOSIS — R7989 Other specified abnormal findings of blood chemistry: Secondary | ICD-10-CM

## 2023-09-05 DIAGNOSIS — E162 Hypoglycemia, unspecified: Secondary | ICD-10-CM

## 2023-09-05 NOTE — Telephone Encounter (Signed)
Error

## 2023-09-05 NOTE — Telephone Encounter (Signed)
Pt called asking would she get her referral sent over to another location pt states they take medicaid as well. Please advise.   [facility] [address], [place], [place] [address]

## 2023-09-07 NOTE — Telephone Encounter (Signed)
Referral to endocrinologist per patient request ordered today

## 2023-09-08 ENCOUNTER — Encounter: Payer: Self-pay | Admitting: Radiology

## 2023-09-22 ENCOUNTER — Ambulatory Visit: Payer: Medicaid Other | Admitting: Nurse Practitioner

## 2023-09-22 ENCOUNTER — Telehealth: Payer: Self-pay | Admitting: Nurse Practitioner

## 2023-09-22 VITALS — BP 126/78 | HR 83 | Temp 98.1°F | Ht 68.0 in | Wt 240.0 lb

## 2023-09-22 DIAGNOSIS — E162 Hypoglycemia, unspecified: Secondary | ICD-10-CM

## 2023-09-22 DIAGNOSIS — E6609 Other obesity due to excess calories: Secondary | ICD-10-CM | POA: Diagnosis not present

## 2023-09-22 DIAGNOSIS — E66811 Obesity, class 1: Secondary | ICD-10-CM | POA: Diagnosis not present

## 2023-09-22 LAB — POCT URINE PREGNANCY: Preg Test, Ur: NEGATIVE

## 2023-09-22 MED ORDER — WEGOVY 0.25 MG/0.5ML ~~LOC~~ SOAJ
0.2500 mg | SUBCUTANEOUS | 2 refills | Status: DC
Start: 2023-09-22 — End: 2023-11-11

## 2023-09-22 MED ORDER — DEXCOM G7 SENSOR MISC
11 refills | Status: DC
Start: 2023-09-22 — End: 2023-12-19

## 2023-09-22 NOTE — Telephone Encounter (Signed)
Please start prior authorization for wegovy for weight loss

## 2023-09-22 NOTE — Assessment & Plan Note (Signed)
Chronic Starting weight 240 pounds, starting BMI 36.49 Per shared decision making we will prescribe Wegovy 0.25 mg injection that she will take once a week.  We discussed potential side effects and blackbox warning.  We discussed what she should call the office to disclose if she experiences these.  We discussed injection technique.  We discussed proper needle disposal.  Patient to follow-up in 6 weeks or sooner as needed.

## 2023-09-22 NOTE — Assessment & Plan Note (Signed)
Intermittent Will try to see if her insurance will cover Dexcom so she can monitor her blood sugars more frequently.  Follow-up with endocrinology as scheduled.  We will start her on Wegovy for weight loss, patient was encouraged to let me know if she has worsening hypoglycemia upon starting this medication if so we will need to discontinue it.

## 2023-09-22 NOTE — Progress Notes (Signed)
Established Patient Office Visit  Subjective   Patient ID: Krystal Humphrey, female    DOB: 11-27-1986  Age: 37 y.o. MRN: [id number]  Chief Complaint  Patient presents with   Obesity    Patient has today for the above. Current weight 240 pounds, current BMI 36.49. Patient has Nexplanon in for contraception, she is no longer breast-feeding her son.  She stopped this about 1 week ago.  She would like to discuss medication options to help her with weight loss.  She reports that she has struggled with losing weight on and off for years.  Most recently she has taken phentermine which resulted in weight loss but was not sustained long-term.  She is also been focusing on improving diet and slowly increasing exercise, but has not seen any weight loss with these changes thus far.  She denies personal or family history of thyroid cancer, has had her gallbladder removed previously, no history of pancreatitis.  She does report periods of hypoglycemia, she is waiting to see endocrinology for further evaluation of this.      ROS: see HPI    Objective:     BP 126/78   Pulse 83   Temp 98.1 F (36.7 C) (Temporal)   Ht 5\' 8"  (1.727 m)   Wt 240 lb (108.9 kg)   SpO2 98%   Breastfeeding No   BMI 36.49 kg/m    Physical Exam Vitals reviewed.  Constitutional:      General: She is not in acute distress.    Appearance: Normal appearance.  HENT:     Head: Normocephalic and atraumatic.  Cardiovascular:     Rate and Rhythm: Normal rate and regular rhythm.     Pulses: Normal pulses.     Heart sounds: Normal heart sounds.  Pulmonary:     Effort: Pulmonary effort is normal.     Breath sounds: Normal breath sounds.  Skin:    General: Skin is warm and dry.  Neurological:     General: No focal deficit present.     Mental Status: She is alert and oriented to person, place, and time.  Psychiatric:        Mood and Affect: Mood normal.        Behavior: Behavior normal.        Judgment: Judgment  normal.      Results for orders placed or performed in visit on 09/22/23  POCT urine pregnancy  Result Value Ref Range   Preg Test, Ur Negative Negative      The ASCVD Risk score (Arnett DK, et al., 2019) failed to calculate for the following reasons:   The 2019 ASCVD risk score is only valid for ages 92 to 27    Assessment & Plan:   Problem List Items Addressed This Visit       Endocrine   Hypoglycemia    Intermittent Will try to see if her insurance will cover Dexcom so she can monitor her blood sugars more frequently.  Follow-up with endocrinology as scheduled.  We will start her on Wegovy for weight loss, patient was encouraged to let me know if she has worsening hypoglycemia upon starting this medication if so we will need to discontinue it.      Relevant Medications   Continuous Glucose Sensor (DEXCOM G7 SENSOR) MISC     Other   Obesity - Primary    Chronic Starting weight 240 pounds, starting BMI 36.49 Per shared decision making we will prescribe Wegovy 0.25 mg injection  that she will take once a week.  We discussed potential side effects and blackbox warning.  We discussed what she should call the office to disclose if she experiences these.  We discussed injection technique.  We discussed proper needle disposal.  Patient to follow-up in 6 weeks or sooner as needed.      Relevant Medications   Semaglutide-Weight Management (WEGOVY) 0.25 MG/0.5ML SOAJ   Other Relevant Orders   POCT urine pregnancy (Completed)    Return in about 6 weeks (around 11/03/2023) for F/U with Patric Buckhalter.    Elenore Paddy, NP

## 2023-09-22 NOTE — Patient Instructions (Signed)
Call for any nausea/vomiting, abdominal pain, swelling/masses of throat, difficulty swallowing

## 2023-09-23 ENCOUNTER — Other Ambulatory Visit (HOSPITAL_COMMUNITY): Payer: Self-pay

## 2023-09-23 ENCOUNTER — Telehealth: Payer: Self-pay

## 2023-09-23 NOTE — Telephone Encounter (Signed)
Pharmacy Patient Advocate Encounter   Received notification from Pt Calls Messages that prior authorization for Wegovy 0.25mg /0.63ml is required/requested.   Insurance verification completed.   The patient is insured through [facility] [place] .   Per test claim: PA required; PA submitted to [facility] [place] Medicaid via CoverMyMeds Key/confirmation #/EOC  [facility] Status is pending

## 2023-09-23 NOTE — Telephone Encounter (Signed)
Pharmacy Patient Advocate Encounter  Received notification from [facility] that Prior Authorization for Wegovy 0.25MG /0.5ML auto-injectors has been APPROVED from 09/23/2023 to 03/21/2024. Ran test claim, Copay is $0.00. This test claim was processed through [facility]- copay amounts may vary at other pharmacies due to pharmacy/plan contracts, or as the patient moves through the different stages of their insurance plan.   PA #/Case ID/Reference #: [id number]  Pharmacy contacted.

## 2023-10-14 ENCOUNTER — Telehealth: Payer: Self-pay | Admitting: Pharmacy Technician

## 2023-10-14 ENCOUNTER — Other Ambulatory Visit (HOSPITAL_COMMUNITY): Payer: Self-pay

## 2023-10-14 NOTE — Telephone Encounter (Signed)
Pharmacy Patient Advocate Encounter   Received notification from CoverMyMeds that prior authorization for Dexcom G7 Sensor is required/requested.   Insurance verification completed.   The patient is insured through [facility] [place] .   Per test claim: PA required; PA submitted to above mentioned insurance via CoverMyMeds Key/confirmation #/EOC [facility] Status is pending

## 2023-10-17 NOTE — Telephone Encounter (Signed)
Pharmacy Patient Advocate Encounter  Received notification from [facility] Medicaid that Prior Authorization for Dexcom G7 Sensor has been DENIED.  Full denial letter will be uploaded to the media tab. See denial reason below.   PA #/Case ID/Reference #: [id number]

## 2023-11-10 ENCOUNTER — Ambulatory Visit: Payer: Medicaid Other | Admitting: Nurse Practitioner

## 2023-11-11 ENCOUNTER — Ambulatory Visit: Payer: BC Managed Care – PPO | Admitting: Nurse Practitioner

## 2023-11-11 VITALS — BP 132/84 | Temp 98.0°F | Ht 68.0 in | Wt 230.0 lb

## 2023-11-11 DIAGNOSIS — E66811 Obesity, class 1: Secondary | ICD-10-CM | POA: Diagnosis not present

## 2023-11-11 DIAGNOSIS — E6609 Other obesity due to excess calories: Secondary | ICD-10-CM

## 2023-11-11 MED ORDER — WEGOVY 0.5 MG/0.5ML ~~LOC~~ SOAJ
0.5000 mg | SUBCUTANEOUS | 1 refills | Status: DC
Start: 2023-11-11 — End: 2023-12-19

## 2023-11-11 NOTE — Progress Notes (Signed)
   Established Patient Office Visit  Subjective   Patient ID: Krystal Humphrey, female    DOB: July 25, 1986  Age: 37 y.o. MRN: [id number]  Chief Complaint  Patient presents with   Obesity    Obesity: Patient has for follow-up.  Starting weight 240 pounds, starting BMI 36.49.  Current weight 230 pounds, current BMI 34.97.  Patient is on Wegovy 0.25 mg weekly injection.  Tolerating medication well.  No significant side effects.    Review of Systems  Cardiovascular:  Negative for chest pain.  Gastrointestinal:  Positive for nausea (mild). Negative for abdominal pain and vomiting.  Musculoskeletal:  Negative for neck pain.      Objective:     BP 132/84   Temp 98 F (36.7 C) (Temporal)   Ht 5\' 8"  (1.727 m)   Wt 230 lb (104.3 kg)   BMI 34.97 kg/m  BP Readings from Last 3 Encounters:  11/11/23 132/84  09/22/23 126/78  09/01/23 112/82   Wt Readings from Last 3 Encounters:  11/11/23 230 lb (104.3 kg)  09/22/23 240 lb (108.9 kg)  09/01/23 241 lb 4 oz (109.4 kg)      Physical Exam Vitals reviewed.  Constitutional:      General: She is not in acute distress.    Appearance: Normal appearance.  HENT:     Head: Normocephalic and atraumatic.  Cardiovascular:     Rate and Rhythm: Normal rate and regular rhythm.     Pulses: Normal pulses.     Heart sounds: Normal heart sounds.  Pulmonary:     Effort: Pulmonary effort is normal.     Breath sounds: Normal breath sounds.  Skin:    General: Skin is warm and dry.  Neurological:     General: No focal deficit present.     Mental Status: She is alert and oriented to person, place, and time.  Psychiatric:        Mood and Affect: Mood normal.        Behavior: Behavior normal.        Judgment: Judgment normal.      No results found for any visits on 11/11/23.    The ASCVD Risk score (Arnett DK, et al., 2019) failed to calculate for the following reasons:   The 2019 ASCVD risk score is only valid for ages 62 to 107     Assessment & Plan:   Problem List Items Addressed This Visit       Other   Obesity - Primary (Chronic)   Chronic Patient tolerating Wegovy well.  Has already lost approximately 10 pounds (~4%).  Has completed 4 doses.  Will increase dose of Wegovy to 0.5 mg weekly.  Patient to follow back up in 4 weeks or sooner as needed.      Relevant Medications   Semaglutide-Weight Management (WEGOVY) 0.5 MG/0.5ML SOAJ    Return in about 1 month (around 12/12/2023) for F/U with Eon Zunker.    Elenore Paddy, NP

## 2023-11-11 NOTE — Assessment & Plan Note (Signed)
Chronic Patient tolerating Wegovy well.  Has already lost approximately 10 pounds (~4%).  Has completed 4 doses.  Will increase dose of Wegovy to 0.5 mg weekly.  Patient to follow back up in 4 weeks or sooner as needed.

## 2023-12-19 ENCOUNTER — Ambulatory Visit (INDEPENDENT_AMBULATORY_CARE_PROVIDER_SITE_OTHER): Payer: Medicaid Other | Admitting: Nurse Practitioner

## 2023-12-19 VITALS — BP 118/80 | HR 74 | Temp 98.3°F | Ht 68.0 in | Wt 222.5 lb

## 2023-12-19 DIAGNOSIS — E66811 Obesity, class 1: Secondary | ICD-10-CM | POA: Diagnosis not present

## 2023-12-19 DIAGNOSIS — E6609 Other obesity due to excess calories: Secondary | ICD-10-CM | POA: Diagnosis not present

## 2023-12-19 DIAGNOSIS — Z6833 Body mass index (BMI) 33.0-33.9, adult: Secondary | ICD-10-CM | POA: Diagnosis not present

## 2023-12-19 LAB — POCT URINE PREGNANCY: Preg Test, Ur: NEGATIVE

## 2023-12-19 MED ORDER — WEGOVY 1 MG/0.5ML ~~LOC~~ SOAJ
1.0000 mg | SUBCUTANEOUS | 0 refills | Status: DC
Start: 2023-12-19 — End: 2024-01-05

## 2023-12-19 NOTE — Progress Notes (Signed)
Established Patient Office Visit  Subjective   Patient ID: Krystal Humphrey, female    DOB: 1986/06/25  Age: 38 y.o. MRN: [id number]  Chief Complaint  Patient presents with   Obesity    Obesity: Starting weight 240 pounds, starting BMI 36.49.  Today's weight 222 pounds, today's BMI 33.83.  Her goal weight is around 160-165 lbs. patient reports significant changes to her diet including focusing on more protein and vegetables and trying to avoid processed foods.  Overall she is feeling really well.  Denies any abdominal pain, vomiting, pain or pressure to her neck.  She is currently on Ozempic 0.5 mg weekly injection.  She also takes Nexplanon for contraception.  Does not have a menstrual cycle anymore which she feels is a side effect of the Nexplanon.    ROS: see HPI    Objective:     BP 118/80   Pulse 74   Temp 98.3 F (36.8 C) (Temporal)   Ht 5\' 8"  (1.727 m)   Wt 222 lb 8 oz (100.9 kg)   SpO2 98%   BMI 33.83 kg/m  BP Readings from Last 3 Encounters:  12/19/23 118/80  11/11/23 132/84  09/22/23 126/78   Wt Readings from Last 3 Encounters:  12/19/23 222 lb 8 oz (100.9 kg)  11/11/23 230 lb (104.3 kg)  09/22/23 240 lb (108.9 kg)      Physical Exam Vitals reviewed.  Constitutional:      General: She is not in acute distress.    Appearance: Normal appearance.  HENT:     Head: Normocephalic and atraumatic.  Neck:     Vascular: No carotid bruit.  Cardiovascular:     Rate and Rhythm: Normal rate and regular rhythm.     Pulses: Normal pulses.     Heart sounds: Normal heart sounds.  Pulmonary:     Effort: Pulmonary effort is normal.     Breath sounds: Normal breath sounds.  Skin:    General: Skin is warm and dry.  Neurological:     General: No focal deficit present.     Mental Status: She is alert and oriented to person, place, and time.  Psychiatric:        Mood and Affect: Mood normal.        Behavior: Behavior normal.        Judgment: Judgment normal.       Results for orders placed or performed in visit on 12/19/23  POCT urine pregnancy  Result Value Ref Range   Preg Test, Ur Negative Negative      The ASCVD Risk score (Arnett DK, et al., 2019) failed to calculate for the following reasons:   The 2019 ASCVD risk score is only valid for ages 62 to 20    Assessment & Plan:   Problem List Items Addressed This Visit       Other   Obesity - Primary (Chronic)   Chronic Patient tolerating Wegovy well.  She is lost about 7.5% of weight since initiation.  Would like to increase to [facility] 1 mg weekly injection.  Prescription refill sent to patient's pharmacy.  She was again encouraged to continue to prevent pregnancy while taking the medication and if pregnancy were to occur to notify myself or OB/GYN immediately.  She reports her understanding.  Patient to reach out after she has completed 3 of the 1 mg injections as long as she continues to tolerate it well we will plan on increasing to 1.7 mg/week.  Patient to  follow-up in person in 3 months or sooner as needed.      Relevant Medications   Semaglutide-Weight Management (WEGOVY) 1 MG/0.5ML SOAJ   Other Relevant Orders   POCT urine pregnancy (Completed)    No follow-ups on file.    Elenore Paddy, NP

## 2023-12-19 NOTE — Assessment & Plan Note (Signed)
Chronic Patient tolerating Wegovy well.  She is lost about 7.5% of weight since initiation.  Would like to increase to [facility] 1 mg weekly injection.  Prescription refill sent to patient's pharmacy.  She was again encouraged to continue to prevent pregnancy while taking the medication and if pregnancy were to occur to notify myself or OB/GYN immediately.  She reports her understanding.  Patient to reach out after she has completed 3 of the 1 mg injections as long as she continues to tolerate it well we will plan on increasing to 1.7 mg/week.  Patient to follow-up in person in 3 months or sooner as needed.

## 2024-01-05 ENCOUNTER — Other Ambulatory Visit: Payer: Self-pay | Admitting: Nurse Practitioner

## 2024-01-05 ENCOUNTER — Encounter: Payer: Self-pay | Admitting: Nurse Practitioner

## 2024-01-05 DIAGNOSIS — E6609 Other obesity due to excess calories: Secondary | ICD-10-CM

## 2024-01-05 MED ORDER — WEGOVY 1.7 MG/0.75ML ~~LOC~~ SOAJ
1.7000 mg | SUBCUTANEOUS | 0 refills | Status: DC
Start: 2024-01-05 — End: 2024-02-08

## 2024-02-08 ENCOUNTER — Other Ambulatory Visit: Payer: Self-pay | Admitting: Nurse Practitioner

## 2024-02-08 DIAGNOSIS — E6609 Other obesity due to excess calories: Secondary | ICD-10-CM

## 2024-02-08 MED ORDER — WEGOVY 2.4 MG/0.75ML ~~LOC~~ SOAJ: 2.4000 mg | SUBCUTANEOUS | 2 refills | Status: DC

## 2024-02-14 ENCOUNTER — Ambulatory Visit: Payer: Self-pay

## 2024-02-14 ENCOUNTER — Other Ambulatory Visit: Payer: Self-pay | Admitting: Student

## 2024-02-14 DIAGNOSIS — S6701XA Crushing injury of right thumb, initial encounter: Secondary | ICD-10-CM

## 2024-03-09 ENCOUNTER — Ambulatory Visit: Payer: Medicaid Other | Admitting: Nurse Practitioner

## 2024-03-09 ENCOUNTER — Encounter: Payer: Self-pay | Admitting: Nurse Practitioner

## 2024-03-09 ENCOUNTER — Telehealth: Payer: Self-pay | Admitting: Nurse Practitioner

## 2024-03-09 VITALS — BP 116/82 | HR 94 | Temp 97.6°F | Ht 68.0 in | Wt 202.4 lb

## 2024-03-09 DIAGNOSIS — E6609 Other obesity due to excess calories: Secondary | ICD-10-CM

## 2024-03-09 DIAGNOSIS — E66811 Obesity, class 1: Secondary | ICD-10-CM

## 2024-03-09 DIAGNOSIS — Z6830 Body mass index (BMI) 30.0-30.9, adult: Secondary | ICD-10-CM | POA: Diagnosis not present

## 2024-03-09 NOTE — Telephone Encounter (Signed)
 Please initiate prior authorization for [facility]. This would be for renewal. She is on 2.4mg /week. She started in 08/2023 and beginning weight was 240# with BMI of 36.49 and current weight is 202 with current BMI of 30.77

## 2024-03-09 NOTE — Progress Notes (Signed)
   Established Patient Office Visit  Subjective   Patient ID: Krystal Humphrey, female    DOB: 1986/08/23  Age: 38 y.o. MRN: [id number]  Chief Complaint  Patient presents with   Obesity    Obesity: Patient started on Wegovy in October 2024.  At that time starting weight 240 pounds and starting BMI 36.49.  Today she is 202 pounds and BMI is 30.77.  She reports that she is following a calorie deficit diet with emphasis on whole foods as well as exercising regularly.  She is also working with a Systems analyst.  She reports tolerating medication well.  She is on Wegovy 2.4 mg weekly injection.    ROS: see HPI    Objective:     BP 116/82   Pulse 94   Temp 97.6 F (36.4 C) (Temporal)   Ht 5\' 8"  (1.727 m)   Wt 202 lb 6 oz (91.8 kg)   LMP  (LMP Unknown)   SpO2 95%   BMI 30.77 kg/m  BP Readings from Last 3 Encounters:  03/09/24 116/82  12/19/23 118/80  11/11/23 132/84   Wt Readings from Last 3 Encounters:  03/09/24 202 lb 6 oz (91.8 kg)  12/19/23 222 lb 8 oz (100.9 kg)  11/11/23 230 lb (104.3 kg)      Physical Exam Vitals reviewed.  Constitutional:      General: She is not in acute distress.    Appearance: Normal appearance.  HENT:     Head: Normocephalic and atraumatic.  Cardiovascular:     Rate and Rhythm: Normal rate and regular rhythm.     Pulses: Normal pulses.     Heart sounds: Normal heart sounds.  Pulmonary:     Effort: Pulmonary effort is normal.     Breath sounds: Normal breath sounds.  Skin:    General: Skin is warm and dry.  Neurological:     General: No focal deficit present.     Mental Status: She is alert and oriented to person, place, and time.  Psychiatric:        Mood and Affect: Mood normal.        Behavior: Behavior normal.        Judgment: Judgment normal.      No results found for any visits on 03/09/24.    The ASCVD Risk score (Arnett DK, et al., 2019) failed to calculate for the following reasons:   The 2019 ASCVD risk score is  only valid for ages 23 to 51    Assessment & Plan:   Problem List Items Addressed This Visit       Other   Obesity - Primary (Chronic)   Starting weight: 240 pounds, starting BMI 36.49 Current weight 202 pounds, current BMI 30.77 Tolerating Wegovy well, continue 2.4 mg weekly injection. Continue exercising regularly. Continue healthy diet with focus on vegetables, fruits, whole grains, lean proteins. Follow-up in 6 months for annual physical exam, or sooner as needed.       Return in about 6 months (around 09/08/2024) for CPE with Agness Sibrian.    Elenore Paddy, NP

## 2024-03-09 NOTE — Assessment & Plan Note (Signed)
 Starting weight: 240 pounds, starting BMI 36.49 Current weight 202 pounds, current BMI 30.77 Tolerating Wegovy well, continue 2.4 mg weekly injection. Continue exercising regularly. Continue healthy diet with focus on vegetables, fruits, whole grains, lean proteins. Follow-up in 6 months for annual physical exam, or sooner as needed.

## 2024-03-13 ENCOUNTER — Other Ambulatory Visit (HOSPITAL_COMMUNITY): Payer: Self-pay

## 2024-03-13 ENCOUNTER — Telehealth: Payer: Self-pay

## 2024-03-13 NOTE — Telephone Encounter (Signed)
 Pharmacy Patient Advocate Encounter  Received notification from [facility] Medicaid that Prior Authorization for [facility] 2.4MG /0.75ML auto-injectors has been APPROVED from 03/13/24 to 03/13/25. Unable to obtain price due to refill too soon rejection, last fill date 03/05/24 next available fill date04/28/25   PA #/Case ID/Reference #: [id number]

## 2024-03-13 NOTE — Telephone Encounter (Signed)
 Pharmacy Patient Advocate Encounter   Received notification from [employer] Calls Messages that prior authorization for [facility] 2.4MG /0.75ML auto-injectors is required/requested.   Insurance verification completed.   The patient is insured through [facility] [place] .   Per test claim: PA required; PA submitted to above mentioned insurance via CoverMyMeds Key/confirmation #/EOC [facility] Status is pending

## 2024-03-13 NOTE — Telephone Encounter (Signed)
 PA request has been Submitted. New Encounter has been or will be created for follow up. For additional info see Pharmacy Prior Auth telephone encounter from 03/13/2024.

## 2024-03-19 ENCOUNTER — Encounter: Payer: Self-pay | Admitting: Nurse Practitioner

## 2024-03-29 ENCOUNTER — Encounter (INDEPENDENT_AMBULATORY_CARE_PROVIDER_SITE_OTHER): Payer: Self-pay | Admitting: Nurse Practitioner

## 2024-03-29 ENCOUNTER — Other Ambulatory Visit: Payer: Self-pay | Admitting: Nurse Practitioner

## 2024-03-29 ENCOUNTER — Telehealth: Payer: Self-pay | Admitting: Nurse Practitioner

## 2024-03-29 DIAGNOSIS — E669 Obesity, unspecified: Secondary | ICD-10-CM | POA: Diagnosis not present

## 2024-03-29 DIAGNOSIS — E66811 Obesity, class 1: Secondary | ICD-10-CM

## 2024-03-29 MED ORDER — PHENTERMINE HCL 15 MG PO CAPS: ORAL_CAPSULE | ORAL | 0 refills | Status: DC

## 2024-03-29 NOTE — Progress Notes (Signed)
 Prescription for phentermine  for weight loss sent to pharmacy. Plan will be for patient to take phentermine  15mg  capsule by mouth once a day in the morning x 7 days, then increase to 30mg  by mouth once a day in the morning for a total of 12 weeks. Patient will follow-up in 4 weeks to check how she is tolerating the medication and for BP check. Phentermine  should be discontinued on 06/21/24.

## 2024-03-29 NOTE — Telephone Encounter (Signed)

## 2024-03-29 NOTE — Telephone Encounter (Signed)
 Please call patient and schedule her for a follow-up with me the last week of May. This needs to be in-person for BP check and weigh-in.

## 2024-05-03 ENCOUNTER — Ambulatory Visit: Admitting: Nurse Practitioner

## 2024-05-03 ENCOUNTER — Ambulatory Visit: Admitting: Family Medicine

## 2024-05-03 ENCOUNTER — Encounter: Payer: Self-pay | Admitting: Family Medicine

## 2024-05-03 VITALS — BP 122/84 | HR 79 | Temp 97.9°F | Ht 68.0 in | Wt 189.0 lb

## 2024-05-03 DIAGNOSIS — Z6828 Body mass index (BMI) 28.0-28.9, adult: Secondary | ICD-10-CM

## 2024-05-03 DIAGNOSIS — E663 Overweight: Secondary | ICD-10-CM | POA: Diagnosis not present

## 2024-05-03 MED ORDER — PHENTERMINE HCL 37.5 MG PO CAPS: 37.5000 mg | ORAL_CAPSULE | ORAL | 2 refills | Status: DC

## 2024-05-03 NOTE — Progress Notes (Signed)
   Established Patient Office Visit  Subjective   Patient ID: Krystal Humphrey, female    DOB: 1986/11/29  Age: 38 y.o. MRN: [id number]  Chief Complaint  Patient presents with   Medication Management    Check in for phentermine , states she has done this before so was familiar. Wegovy  gave her hair loss so went back on phentermine . States it has been doing well w no side effects. Thinks it's just maintaining her weight, wants to move up to 37    HPI Patient presents today for medication management. Reports compliance with medication regimen. She is currently taking phentermine  15 mg.  Is maintaining weight at 189 pounds. Requesting increase to 37.5 mg of phentermine .  She has taken this in the past with good results and no side effects. Not fasting today. Denies other concerns. Medical history as outlined below.  ROS Per HPI    Objective:     BP 122/84 (BP Location: Left Arm, Patient Position: Sitting)   Pulse 79   Temp 97.9 F (36.6 C) (Temporal)   Ht 5\' 8"  (1.727 m)   Wt 189 lb (85.7 kg)   SpO2 99%   BMI 28.74 kg/m   Physical Exam Vitals and nursing note reviewed.  Constitutional:      General: She is not in acute distress.    Appearance: Normal appearance.  HENT:     Head: Normocephalic and atraumatic.     Right Ear: External ear normal.     Left Ear: External ear normal.  Eyes:     Extraocular Movements: Extraocular movements intact.     Pupils: Pupils are equal, round, and reactive to light.  Cardiovascular:     Rate and Rhythm: Normal rate and regular rhythm.     Pulses: Normal pulses.     Heart sounds: Normal heart sounds.  Pulmonary:     Effort: Pulmonary effort is normal. No respiratory distress.     Breath sounds: Normal breath sounds. No wheezing, rhonchi or rales.  Musculoskeletal:        General: Normal range of motion.     Cervical back: Normal range of motion.     Right lower leg: No edema.     Left lower leg: No edema.  Lymphadenopathy:      Cervical: No cervical adenopathy.  Neurological:     General: No focal deficit present.     Mental Status: She is alert and oriented to person, place, and time.  Psychiatric:        Mood and Affect: Mood normal.        Thought Content: Thought content normal.     No results found for any visits on 05/03/24.   The ASCVD Risk score (Arnett DK, et al., 2019) failed to calculate for the following reasons:   The 2019 ASCVD risk score is only valid for ages 66 to 66    Assessment & Plan:   Overweight with body mass index (BMI) of 28 to 28.9 in adult -     Phentermine  HCl; Take 1 capsule (37.5 mg total) by mouth every morning.  Dispense: 30 capsule; Refill: 2     Return in about 3 months (around 08/03/2024) for Med check.    Wellington Half, FNP

## 2024-05-03 NOTE — Patient Instructions (Signed)
 I have sent refills of phentermine  to your pharmacy.  Follow-up in 3 months for medication management

## 2024-06-25 ENCOUNTER — Encounter: Payer: Self-pay | Admitting: Family Medicine

## 2024-06-29 ENCOUNTER — Ambulatory Visit: Admitting: Family Medicine

## 2024-07-06 NOTE — Progress Notes (Signed)
 Established Patient Office Visit  Subjective:     Patient ID: Krystal Humphrey, female    DOB: 05-10-1986, 38 y.o.   MRN: [id number]  Chief Complaint  Patient presents with   Weight Loss    Discuss getting back on wegovy , phentermine  is not working. Thought wegovy  was causing hair loss but it still losing hair off of medication    HPI  Discussed the use of AI scribe software for clinical note transcription with the patient, who gave verbal consent to proceed.  History of Present Illness Krystal Humphrey is a 38 year old female who presents with hair loss and concerns about weight management.  Alopecia - Significant hair loss persists despite discontinuation of Wegovy  several months ago - Hair falls out in handfuls during showering or brushing - Visible scalp when hair is pulled back - Washes hair daily due to greasiness - Some baby hair regrowth present, but overall thinning remains evident - History of extensive hair dyeing, but no recent dyeing  Weight management - Weight stable at 189 pounds since June, decreased from 241 pounds in September - Current weight management with phentermine  - Exercises at home, including sit-ups and walking with her daughter - Diet focused on salads, grilled chicken, and protein; avoids junk food and fast food - Goal weight is 165-170 pounds - Fluctuations in energy and motivation  Postprandial hypoglycemic symptoms - Occasional dizziness, lightheadedness, and shakiness after meals - Symptoms managed by consuming candy or juice - Drinks water  regularly and rarely consumes soda  Obstetric history - History of gestational diabetes with her son - Possible gestational diabetes with her daughter, who was born weighing 9 pounds 8 ounces     ROS Per HPI      Objective:    BP 124/88 (BP Location: Left Arm, Patient Position: Sitting)   Pulse 84   Temp 98.4 F (36.9 C) (Temporal)   Ht 5' 8 (1.727 m)   Wt 189 lb 9.6 oz (86 kg)   SpO2 100%    BMI 28.83 kg/m    Physical Exam Vitals and nursing note reviewed.  Constitutional:      General: She is not in acute distress.    Appearance: Normal appearance. She is normal weight.  HENT:     Head: Normocephalic and atraumatic.     Right Ear: External ear normal.     Left Ear: External ear normal.     Nose: Nose normal.     Mouth/Throat:     Mouth: Mucous membranes are moist.     Pharynx: Oropharynx is clear.  Eyes:     Extraocular Movements: Extraocular movements intact.     Pupils: Pupils are equal, round, and reactive to light.  Cardiovascular:     Rate and Rhythm: Normal rate and regular rhythm.     Pulses: Normal pulses.     Heart sounds: Normal heart sounds.  Pulmonary:     Effort: Pulmonary effort is normal. No respiratory distress.     Breath sounds: Normal breath sounds. No wheezing, rhonchi or rales.  Musculoskeletal:        General: Normal range of motion.     Cervical back: Normal range of motion.     Right lower leg: No edema.     Left lower leg: No edema.  Lymphadenopathy:     Cervical: No cervical adenopathy.  Neurological:     General: No focal deficit present.     Mental Status: She is alert and oriented to person, place,  and time.  Psychiatric:        Mood and Affect: Mood normal.        Thought Content: Thought content normal.     No results found for any visits on 07/09/24.  The ASCVD Risk score (Arnett DK, et al., 2019) failed to calculate for the following reasons:   The 2019 ASCVD risk score is only valid for ages 19 to 13  BP Readings from Last 3 Encounters:  07/09/24 124/88  05/03/24 122/84  03/09/24 116/82   Wt Readings from Last 3 Encounters:  07/09/24 189 lb 9.6 oz (86 kg)  05/03/24 189 lb (85.7 kg)  03/09/24 202 lb 6 oz (91.8 kg)      Last CBC Lab Results  Component Value Date   WBC 9.0 06/16/2023   HGB 12.8 06/16/2023   HCT 38.7 06/16/2023   MCV 89.0 06/16/2023   MCH 29.4 06/16/2023   RDW 13.5 06/16/2023   PLT 277  06/16/2023   Last metabolic panel Lab Results  Component Value Date   GLUCOSE 107 (H) 09/01/2023   NA 138 09/01/2023   K 3.9 09/01/2023   CL 104 09/01/2023   CO2 25 09/01/2023   BUN 17 09/01/2023   CREATININE 0.73 09/01/2023   GFR 106.96 09/01/2023   CALCIUM 9.9 09/01/2023   PHOS 3.3 11/04/2009   PROT 7.7 06/16/2023   ALBUMIN 3.7 06/16/2023   LABGLOB 2.4 12/31/2019   AGRATIO 1.8 12/31/2019   BILITOT 1.8 (H) 06/16/2023   ALKPHOS 90 06/16/2023   AST 51 (H) 06/16/2023   ALT 75 (H) 06/16/2023   ANIONGAP 8 06/16/2023   Last lipids Lab Results  Component Value Date   CHOL 252 (H) 05/19/2023   HDL 43.90 05/19/2023   LDLDIRECT 175.0 05/19/2023   TRIG 230.0 (H) 05/19/2023   CHOLHDL 6 05/19/2023   Last hemoglobin A1c Lab Results  Component Value Date   HGBA1C 5.7 09/01/2023   Last thyroid  functions Lab Results  Component Value Date   TSH 0.01 (L) 09/01/2023   Last vitamin D  Lab Results  Component Value Date   VD25OH 21.86 (L) 09/01/2023   Last vitamin B12 and Folate Lab Results  Component Value Date   VITAMINB12 590 09/01/2023         Assessment & Plan:   Assessment and Plan Assessment & Plan Overweight with BMI 28-28.9 in adult Phentermine  no longer effective. Significant weight loss achieved but plateaued. Considering Topamax addition and potential Wegovy  restart if criteria met. - Order labs for thyroid  function, liver, kidneys, electrolytes, vitamin B12, and vitamin D . - Discuss adding Topamax to phentermine . - Consider Wegovy  restart if insurance and criteria allow. - Schedule follow-up in one month.  Nonscarring hair loss Ongoing hair loss despite Wegovy  discontinuation. Possible causes include rapid weight loss or thyroid  dysfunction. - Order labs for thyroid  function.  Fatigue Variable energy levels with occasional low energy. Possible contributing factors include hypoglycemia. - Order labs for thyroid  function, liver, kidneys, electrolytes,  vitamin B12, and vitamin D .  Possible hypoglycemia Occasional postprandial dizziness and shakiness, relieved by sugar intake. - Order labs for blood glucose levels.     Orders Placed This Encounter  Procedures   CBC with Differential/Platelet    Release to patient:   Immediate [1]   Comprehensive metabolic panel with GFR    Release to patient:   Immediate [1]   TSH   VITAMIN D  25 Hydroxy (Vit-D Deficiency, Fractures)   Vitamin B12     No orders of  the defined types were placed in this encounter.   Return in about 4 weeks (around 08/06/2024).  Corean LITTIE Ku, FNP

## 2024-07-09 ENCOUNTER — Ambulatory Visit (INDEPENDENT_AMBULATORY_CARE_PROVIDER_SITE_OTHER): Admitting: Family Medicine

## 2024-07-09 ENCOUNTER — Encounter: Payer: Self-pay | Admitting: Family Medicine

## 2024-07-09 ENCOUNTER — Ambulatory Visit: Payer: Self-pay | Admitting: Family Medicine

## 2024-07-09 VITALS — BP 124/88 | HR 84 | Temp 98.4°F | Ht 68.0 in | Wt 189.6 lb

## 2024-07-09 DIAGNOSIS — E162 Hypoglycemia, unspecified: Secondary | ICD-10-CM

## 2024-07-09 DIAGNOSIS — E6609 Other obesity due to excess calories: Secondary | ICD-10-CM

## 2024-07-09 DIAGNOSIS — E66811 Obesity, class 1: Secondary | ICD-10-CM

## 2024-07-09 DIAGNOSIS — R748 Abnormal levels of other serum enzymes: Secondary | ICD-10-CM | POA: Diagnosis not present

## 2024-07-09 DIAGNOSIS — R5383 Other fatigue: Secondary | ICD-10-CM

## 2024-07-09 DIAGNOSIS — L659 Nonscarring hair loss, unspecified: Secondary | ICD-10-CM | POA: Diagnosis not present

## 2024-07-09 DIAGNOSIS — Z6830 Body mass index (BMI) 30.0-30.9, adult: Secondary | ICD-10-CM

## 2024-07-09 DIAGNOSIS — F411 Generalized anxiety disorder: Secondary | ICD-10-CM | POA: Diagnosis not present

## 2024-07-09 DIAGNOSIS — R7989 Other specified abnormal findings of blood chemistry: Secondary | ICD-10-CM

## 2024-07-09 LAB — COMPREHENSIVE METABOLIC PANEL WITH GFR
ALT: 21 U/L (ref 0–35)
AST: 18 U/L (ref 0–37)
Albumin: 4.6 g/dL (ref 3.5–5.2)
Alkaline Phosphatase: 48 U/L (ref 39–117)
BUN: 11 mg/dL (ref 6–23)
CO2: 30 meq/L (ref 19–32)
Calcium: 9.2 mg/dL (ref 8.4–10.5)
Chloride: 102 meq/L (ref 96–112)
Creatinine, Ser: 0.74 mg/dL (ref 0.40–1.20)
GFR: 104.59 mL/min (ref >60.00)
Glucose, Bld: 73 mg/dL (ref 70–99)
Potassium: 3.7 meq/L (ref 3.5–5.1)
Sodium: 138 meq/L (ref 135–145)
Total Bilirubin: 0.5 mg/dL (ref 0.2–1.2)
Total Protein: 7.4 g/dL (ref 6.0–8.3)

## 2024-07-09 LAB — CBC WITH DIFFERENTIAL/PLATELET
Basophils Absolute: 0.1 K/uL (ref 0.0–0.1)
Basophils Relative: 0.7 % (ref 0.0–3.0)
Eosinophils Absolute: 0.3 K/uL (ref 0.0–0.7)
Eosinophils Relative: 3 % (ref 0.0–5.0)
HCT: 43.2 % (ref 36.0–46.0)
Hemoglobin: 14.4 g/dL (ref 12.0–15.0)
Lymphocytes Relative: 31.5 % (ref 12.0–46.0)
Lymphs Abs: 2.6 K/uL (ref 0.7–4.0)
MCHC: 33.4 g/dL (ref 30.0–36.0)
MCV: 89.7 fl (ref 78.0–100.0)
Monocytes Absolute: 0.6 K/uL (ref 0.1–1.0)
Monocytes Relative: 7.4 % (ref 3.0–12.0)
Neutro Abs: 4.8 K/uL (ref 1.4–7.7)
Neutrophils Relative %: 57.4 % (ref 43.0–77.0)
Platelets: 372 K/uL (ref 150.0–400.0)
RBC: 4.82 Mil/uL (ref 3.87–5.11)
RDW: 12.9 % (ref 11.5–15.5)
WBC: 8.4 K/uL (ref 4.0–10.5)

## 2024-07-09 LAB — VITAMIN D 25 HYDROXY (VIT D DEFICIENCY, FRACTURES): VITD: 33.09 ng/mL (ref 30.00–100.00)

## 2024-07-09 LAB — TSH: TSH: 2.63 u[IU]/mL (ref 0.35–5.50)

## 2024-07-09 LAB — VITAMIN B12: Vitamin B-12: 491 pg/mL (ref 211–911)

## 2024-07-09 NOTE — Patient Instructions (Addendum)
 Check out [employer] on social media  We are checking labs today, will be in contact with any results that require further attention  Follow up with me in one month for evaluation of medication effectiveness.

## 2024-07-10 MED ORDER — BUPROPION HCL ER (SR) 100 MG PO TB12: 100.0000 mg | ORAL_TABLET | Freq: Every morning | ORAL | 1 refills | Status: DC

## 2024-07-25 DIAGNOSIS — M542 Cervicalgia: Secondary | ICD-10-CM | POA: Insufficient documentation

## 2024-07-25 DIAGNOSIS — M545 Low back pain, unspecified: Secondary | ICD-10-CM | POA: Insufficient documentation

## 2024-08-06 DIAGNOSIS — M436 Torticollis: Secondary | ICD-10-CM | POA: Insufficient documentation

## 2024-08-09 ENCOUNTER — Ambulatory Visit: Admitting: Nurse Practitioner

## 2024-08-09 ENCOUNTER — Telehealth: Payer: Self-pay

## 2024-08-09 ENCOUNTER — Telehealth: Payer: Self-pay | Admitting: Nurse Practitioner

## 2024-08-09 ENCOUNTER — Other Ambulatory Visit (HOSPITAL_COMMUNITY): Payer: Self-pay

## 2024-08-09 VITALS — BP 128/82 | HR 86 | Temp 98.0°F | Ht 68.0 in | Wt 192.4 lb

## 2024-08-09 DIAGNOSIS — Z6836 Body mass index (BMI) 36.0-36.9, adult: Secondary | ICD-10-CM | POA: Diagnosis not present

## 2024-08-09 DIAGNOSIS — E66812 Obesity, class 2: Secondary | ICD-10-CM

## 2024-08-09 LAB — POCT URINE PREGNANCY: Preg Test, Ur: NEGATIVE

## 2024-08-09 MED ORDER — WEGOVY 0.25 MG/0.5ML ~~LOC~~ SOAJ: 0.2500 mg | SUBCUTANEOUS | 0 refills | Status: DC

## 2024-08-09 NOTE — Telephone Encounter (Signed)
 Please start prior auth for wegovy . Her starting BMI was 36.49 in 08/2023, she has been on wegovy  in the past and is off of it now. Current BMI is 29.25, with recent weight regain.

## 2024-08-09 NOTE — Telephone Encounter (Signed)
 Pharmacy Patient Advocate Encounter   Received notification from Pt Calls Messages that prior authorization for Wegovy  0.25mg /0.2ml is required/requested.   Insurance verification completed.   The patient is insured through [facility] MEDICAID .   Per test claim: PA required; PA submitted to above mentioned insurance via Phone Key/confirmation #/[id number] Status is pending

## 2024-08-09 NOTE — Progress Notes (Signed)
 Established Patient Office Visit  Subjective   Patient ID: Krystal Humphrey, female    DOB: 1986-06-24  Age: 38 y.o. MRN: [id number]  Chief Complaint  Patient presents with   Obesity    Discussed the use of AI scribe software for clinical note transcription with the patient, who gave verbal consent to proceed.  History of Present Illness Krystal Humphrey is a 38 year old female who presents for follow-up regarding weight loss management.  Obesity: -Starting BMI 36.49 in October 2024 -Treated with Wegovy  and tolerated well but did have hair loss and this was discontinued -Most recently treated with phentermine , tolerated well -Currently not on medication to assist with weight loss, is noticing weight regain and would like to discuss restarting Wegovy .  Current BMI 29.25 - Hesitant to use Wellbutrin  and Topamax due to potential side effects - Uses Nexplanon for contraception Physical activity - Engages in physical activity, primarily walking with her child in a stroller      ROS: see HPI    Objective:     BP 128/82   Pulse 86   Temp 98 F (36.7 C) (Temporal)   Ht 5' 8 (1.727 m)   Wt 192 lb 6 oz (87.3 kg)   SpO2 98%   Breastfeeding No   BMI 29.25 kg/m  BP Readings from Last 3 Encounters:  08/09/24 128/82  07/09/24 124/88  05/03/24 122/84   Wt Readings from Last 3 Encounters:  08/09/24 192 lb 6 oz (87.3 kg)  07/09/24 189 lb 9.6 oz (86 kg)  05/03/24 189 lb (85.7 kg)      Physical Exam Vitals reviewed.  Constitutional:      General: She is not in acute distress.    Appearance: Normal appearance.  HENT:     Head: Normocephalic and atraumatic.  Neck:     Vascular: No carotid bruit.  Cardiovascular:     Rate and Rhythm: Normal rate and regular rhythm.     Pulses: Normal pulses.     Heart sounds: Normal heart sounds.  Pulmonary:     Effort: Pulmonary effort is normal.     Breath sounds: Normal breath sounds.  Skin:    General: Skin is warm and dry.   Neurological:     General: No focal deficit present.     Mental Status: She is alert and oriented to person, place, and time.  Psychiatric:        Mood and Affect: Mood normal.        Behavior: Behavior normal.        Judgment: Judgment normal.      Results for orders placed or performed in visit on 08/09/24  POCT urine pregnancy  Result Value Ref Range   Preg Test, Ur Negative Negative      The ASCVD Risk score (Arnett DK, et al., 2019) failed to calculate for the following reasons:   The 2019 ASCVD risk score is only valid for ages 73 to 66    Assessment & Plan:   Problem List Items Addressed This Visit       Other   Obesity - Primary (Chronic)   Obesity and overweight Obesity is chronic. BMI improved from 36.49 to 29.2. Hair loss possibly due to Wegovy , postpartum changes, and weight loss. - Discussed restarting Wegovy , patient would like to move forward with wegovy  - Request prior authorization for Wegovy  based on starting BMI. - Send first dose of Wegovy  pending prior authorization. - Follow up in three weeks to ensure  no lapse in treatment. - Pregnancy test negative today, continue Nexplanon use      Relevant Medications   semaglutide -weight management (WEGOVY ) 0.25 MG/0.5ML SOAJ SQ injection   Other Relevant Orders   POCT urine pregnancy (Completed)  Assessment and Plan Assessment & Plan Obesity and overweight Obesity is chronic. BMI improved from 36.49 to 29.2. Hair loss possibly due to Wegovy , postpartum changes, and weight loss. - Discussed restarting Wegovy , patient would like to move forward with wegovy  - Request prior authorization for Wegovy  based on starting BMI. - Send first dose of Wegovy  pending prior authorization. - Follow up in three weeks to ensure no lapse in treatment. - Pregnancy test negative today, continue Nexplanon use    Return in about 3 weeks (around 08/30/2024) for F/U with Kenner Lewan.    [name], NP

## 2024-08-09 NOTE — Assessment & Plan Note (Signed)
 Obesity and overweight Obesity is chronic. BMI improved from 36.49 to 29.2. Hair loss possibly due to Wegovy , postpartum changes, and weight loss. - Discussed restarting Wegovy , patient would like to move forward with wegovy  - Request prior authorization for Wegovy  based on starting BMI. - Send first dose of Wegovy  pending prior authorization. - Follow up in three weeks to ensure no lapse in treatment. - Pregnancy test negative today, continue Nexplanon use

## 2024-08-11 ENCOUNTER — Encounter: Payer: Self-pay | Admitting: Nurse Practitioner

## 2024-08-13 ENCOUNTER — Other Ambulatory Visit (HOSPITAL_COMMUNITY): Payer: Self-pay

## 2024-08-13 NOTE — Telephone Encounter (Signed)
 Pharmacy Patient Advocate Encounter  Received notification from [facility] MEDICAID that Prior Authorization for Wegovy  0.25mg /0.52ml  has been APPROVED from 08/13/25 to 02/10/25. Ran test claim, Copay is $4. This test claim was processed through [facility]- copay amounts may vary at other pharmacies due to pharmacy/plan contracts, or as the patient moves through the different stages of their insurance plan.   PA #/Case ID/Reference #: [id number]

## 2024-08-20 ENCOUNTER — Encounter: Payer: Self-pay | Admitting: Nurse Practitioner

## 2024-08-20 NOTE — Telephone Encounter (Signed)
 Made pt ware of this

## 2024-08-30 ENCOUNTER — Ambulatory Visit: Admitting: Nurse Practitioner

## 2024-08-30 ENCOUNTER — Ambulatory Visit: Payer: Self-pay | Admitting: Nurse Practitioner

## 2024-08-30 VITALS — BP 136/80 | HR 83 | Temp 98.1°F | Ht 68.0 in | Wt 197.4 lb

## 2024-08-30 DIAGNOSIS — R519 Headache, unspecified: Secondary | ICD-10-CM | POA: Diagnosis not present

## 2024-08-30 DIAGNOSIS — E6609 Other obesity due to excess calories: Secondary | ICD-10-CM

## 2024-08-30 DIAGNOSIS — Z6830 Body mass index (BMI) 30.0-30.9, adult: Secondary | ICD-10-CM

## 2024-08-30 DIAGNOSIS — R7303 Prediabetes: Secondary | ICD-10-CM | POA: Diagnosis not present

## 2024-08-30 DIAGNOSIS — E66811 Obesity, class 1: Secondary | ICD-10-CM

## 2024-08-30 DIAGNOSIS — Z23 Encounter for immunization: Secondary | ICD-10-CM | POA: Insufficient documentation

## 2024-08-30 DIAGNOSIS — E559 Vitamin D deficiency, unspecified: Secondary | ICD-10-CM | POA: Insufficient documentation

## 2024-08-30 DIAGNOSIS — E782 Mixed hyperlipidemia: Secondary | ICD-10-CM | POA: Insufficient documentation

## 2024-08-30 LAB — HEMOGLOBIN A1C: Hgb A1c MFr Bld: 5.1 % (ref 4.6–6.5)

## 2024-08-30 MED ORDER — PHENTERMINE HCL 37.5 MG PO TABS: 37.5000 mg | ORAL_TABLET | Freq: Every day | ORAL | 0 refills | Status: DC

## 2024-08-30 MED ORDER — PHENTERMINE HCL 15 MG PO CAPS: 15.0000 mg | ORAL_CAPSULE | ORAL | 0 refills | Status: DC

## 2024-08-30 NOTE — Assessment & Plan Note (Signed)
 Obesity, class 1 Weight gain post-Wegovy  discontinuation.  - Prescribed phentermine  15 mg daily for 1 week, then 37.5 mg for 12 weeks. - Advised caloric intake of 1100-1200 calories/day. - Encouraged increased physical activity.

## 2024-08-30 NOTE — Progress Notes (Signed)
 Established Patient Office Visit  Subjective   Patient ID: Krystal Humphrey, female    DOB: 07-03-1986  Age: 38 y.o. MRN: [id number]  Chief Complaint  Patient presents with   Obesity    Discussed the use of AI scribe software for clinical note transcription with the patient, who gave verbal consent to proceed.  History of Present Illness Krystal Humphrey is a 38 year old female who presents with concerns about weight gain and prediabetes.  Obesity -Starting BMI 08/2023 36.49, dropped to BMI of 29 with use of Wegovy . Has since discontinued wegovy  due to insurance no longer covering it. Current BMI now 30.01 - Difficulty managing weight after discontinuing Wegovy , with weight increasing from 192 lbs to 197 lbs - Prior use of phentermine , tolerated this well -Has comorbid prediabetes, last A1C 5.7  Family history of diabetes mellitus - Mother, grandfather, and aunt with diabetes  Headache -Frequent headache, mild to moderate -Attributed to stress       Review of Systems  Eyes:  Negative for blurred vision and double vision.  Neurological:  Positive for headaches. Negative for dizziness, sensory change, seizures, loss of consciousness and weakness.  : see HPI    Objective:     BP 136/80   Pulse 83   Temp 98.1 F (36.7 C) (Temporal)   Ht 5' 8 (1.727 m)   Wt 197 lb 6 oz (89.5 kg)   SpO2 97%   BMI 30.01 kg/m  BP Readings from Last 3 Encounters:  08/30/24 136/80  08/09/24 128/82  07/09/24 124/88   Wt Readings from Last 3 Encounters:  08/30/24 197 lb 6 oz (89.5 kg)  08/09/24 192 lb 6 oz (87.3 kg)  07/09/24 189 lb 9.6 oz (86 kg)      Physical Exam Vitals reviewed.  Constitutional:      General: She is not in acute distress.    Appearance: Normal appearance.  HENT:     Head: Normocephalic and atraumatic.  Cardiovascular:     Rate and Rhythm: Normal rate and regular rhythm.     Pulses: Normal pulses.     Heart sounds: Normal heart sounds.  Pulmonary:      Effort: Pulmonary effort is normal.     Breath sounds: Normal breath sounds.  Skin:    General: Skin is warm and dry.  Neurological:     General: No focal deficit present.     Mental Status: She is alert and oriented to person, place, and time.  Psychiatric:        Mood and Affect: Mood normal.        Behavior: Behavior normal.        Judgment: Judgment normal.      No results found for any visits on 08/30/24.    The ASCVD Risk score (Arnett DK, et al., 2019) failed to calculate for the following reasons:   The 2019 ASCVD risk score is only valid for ages 39 to 16    Assessment & Plan:   Problem List Items Addressed This Visit       Other   Obesity - Primary (Chronic)   Obesity, class 1 Weight gain post-Wegovy  discontinuation.  - Prescribed phentermine  15 mg daily for 1 week, then 37.5 mg for 12 weeks. - Advised caloric intake of 1100-1200 calories/day. - Encouraged increased physical activity.      Relevant Medications   phentermine  15 MG capsule   phentermine  (ADIPEX-P ) 37.5 MG tablet   Other Relevant Orders   Hemoglobin A1c  Prediabetes   Prediabetes A1c at 5.7. Risk of diabetes due to family history and gestational diabetes. Declined metformin. - Ordered A1c test for glycemic control reassessment.      Headache    Chronic tension-type headaches Frequent headaches linked to stress and weather. No serious neurological symptoms. - Advised monitoring for worsening symptoms.      Needs flu shot   Declined flu shot      Assessment and Plan Assessment & Plan Obesity, class 1 Weight gain post-Wegovy  discontinuation.  - Prescribed phentermine  15 mg daily for 1 week, then 37.5 mg for 12 weeks. - Advised caloric intake of 1100-1200 calories/day. - Encouraged increased physical activity.  Prediabetes A1c at 5.7. Risk of diabetes due to family history and gestational diabetes. Declined metformin. - Ordered A1c test for glycemic control  reassessment.  Chronic tension-type headaches Frequent headaches linked to stress and weather. No serious neurological symptoms. - Advised monitoring for worsening symptoms.  Follow-Up Reassess condition and treatment efficacy in three months. - Scheduled follow-up in three months.  I personally spent a total of 35 minutes in the care of the patient today including preparing to see the patient, getting/reviewing separately obtained history, performing a medically appropriate exam/evaluation, counseling and educating, placing orders, and documenting clinical information in the EHR.  Return in about 3 months (around 11/30/2024) for F/U with Aithana Kushner.    [name], NP

## 2024-08-30 NOTE — Assessment & Plan Note (Signed)
 Prediabetes A1c at 5.7. Risk of diabetes due to family history and gestational diabetes. Declined metformin. - Ordered A1c test for glycemic control reassessment.

## 2024-08-30 NOTE — Assessment & Plan Note (Signed)
 Declined flu shot

## 2024-08-30 NOTE — Assessment & Plan Note (Signed)
  Chronic tension-type headaches Frequent headaches linked to stress and weather. No serious neurological symptoms. - Advised monitoring for worsening symptoms.

## 2024-08-31 ENCOUNTER — Encounter: Payer: Self-pay | Admitting: Nurse Practitioner

## 2024-08-31 ENCOUNTER — Other Ambulatory Visit: Payer: Self-pay | Admitting: Nurse Practitioner

## 2024-08-31 DIAGNOSIS — E66811 Obesity, class 1: Secondary | ICD-10-CM

## 2024-08-31 MED ORDER — PHENTERMINE HCL 37.5 MG PO TABS: 37.5000 mg | ORAL_TABLET | Freq: Every day | ORAL | 0 refills | Status: DC

## 2024-09-20 ENCOUNTER — Encounter: Payer: Self-pay | Admitting: Nurse Practitioner

## 2024-10-11 ENCOUNTER — Ambulatory Visit: Admitting: Nurse Practitioner

## 2024-10-11 VITALS — BP 140/88 | HR 95 | Temp 98.1°F | Ht 68.0 in | Wt 192.4 lb

## 2024-10-11 DIAGNOSIS — M549 Dorsalgia, unspecified: Secondary | ICD-10-CM

## 2024-10-11 DIAGNOSIS — G8929 Other chronic pain: Secondary | ICD-10-CM | POA: Insufficient documentation
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
## 2024-10-11 NOTE — Assessment & Plan Note (Signed)
 Acute low back pain Pain persistent, worsened by standing and certain positions, with tingling in mid back. Previous conservative treatments for > 6 weeks have been ineffective. Differential includes possible nerve impingement. Prefers neurosurgical consultation. - Ordered MRI to assess for nerve impingement. - Referred to neurosurgery for evaluation and management. - Recommended alternating ibuprofen  every 8 hours with Tylenol  as needed for pain.

## 2024-10-17 ENCOUNTER — Encounter: Payer: Self-pay | Admitting: Nurse Practitioner

## 2024-10-18 NOTE — Telephone Encounter (Signed)
 Copied from CRM [id number]. Topic: Referral - Question >> Oct 18, 2024  4:01 PM Alfonso HERO wrote: Reason for CRM: patient calling to ask for referral to be sent over to [facility]. Attn Dr Joshua or Dr Malcolm. Their number is [phone]. She doesn't have the fax number.

## 2024-10-19 ENCOUNTER — Telehealth

## 2024-10-19 ENCOUNTER — Other Ambulatory Visit: Payer: Self-pay | Admitting: Nurse Practitioner

## 2024-10-19 ENCOUNTER — Telehealth: Payer: Self-pay

## 2024-10-19 ENCOUNTER — Telehealth: Payer: Self-pay | Admitting: Nurse Practitioner

## 2024-10-19 ENCOUNTER — Ambulatory Visit: Admitting: Nurse Practitioner

## 2024-10-19 DIAGNOSIS — M549 Dorsalgia, unspecified: Secondary | ICD-10-CM

## 2024-10-19 NOTE — Telephone Encounter (Signed)
 Patient is stating the [facility] has already scheduled her MRI and that she completed 5 weeks of PT. Do I just add this to the office visit note and will this result in approval of MRI? Should she not go to have scan?

## 2024-10-19 NOTE — Telephone Encounter (Signed)
 Copied from CRM #[id number]. Topic: Clinical - Request for Lab/Test Order >> Oct 19, 2024  3:52 PM Viola F wrote: Reason for CRM: Precious from [facility] called to follow up on prior authorization for patients MRI scheduled for 10/24/24 - says a form was faxed yesterday 10/18/24. [id number] >> Oct 19, 2024  4:11 PM Drema MATSU wrote: Patient is calling to follow up on request. She wants to know what she should do regarding authorization approval. She stated she cannot pay for the MRI out of pocket. Please call patient with an update she is worried.

## 2024-10-19 NOTE — Telephone Encounter (Signed)
 Called pt insurance and spoke to [employer], who stated pt has been denied for both her MRI and will need a peer to peer, and she has till December 3rd.

## 2024-10-19 NOTE — Telephone Encounter (Signed)
 Copied from CRM #[id number]. Topic: Clinical - Request for Lab/Test Order >> Oct 19, 2024  3:52 PM Viola F wrote: Reason for CRM: Precious from [facility] called to follow up on prior authorization for patients MRI scheduled for 10/24/24 - says a form was faxed yesterday 10/18/24. [id number]

## 2024-10-22 ENCOUNTER — Telehealth: Payer: Self-pay | Admitting: Nurse Practitioner

## 2024-10-22 NOTE — Telephone Encounter (Signed)
 Please send additional information that is written below to patient's insurance regarding her MRI orders. I was told to either upload the information to [email or web address] or fax it. The information needs to be uploaded or faxed twice because it is needed for 2 MRI orders. Please attach the tracking numbers to each fax facesheet/upload. Once the information has been faxed or uploaded please call the insurance company to ensure they received the information and ask that they re-review the case as patient is scheduled for imaging on 10/24/24 so time is limited to get this completed.   Tracking numbers:  MRI of Lumbar spine: [id number] MRI of Thoracic spine: [id number]   Fax number: [phone]  Information to be added for patient's case:  Her pain has been present since 06/2024. She worked with PT on 08/07/24, 08/09/24, 08/14/24, 106/25, 09/12/24. She also reports that she was doing at-home exercises as prescribed by PT. See mychart message thread starting from 10/17/24 for further information if needed. Her pain as well as sensory symptoms have not improved. Sensory symptoms are tingling in her mid back as well as numbness and tingling in her legs that is intermittent and worsened with prolonged sitting.

## 2024-10-24 ENCOUNTER — Other Ambulatory Visit

## 2024-10-24 NOTE — Telephone Encounter (Signed)
 Added note was completed and faxed 10/22/24, per fax confirmation result (ok)

## 2024-10-31 ENCOUNTER — Other Ambulatory Visit: Payer: Self-pay | Admitting: Nurse Practitioner

## 2024-10-31 DIAGNOSIS — M549 Dorsalgia, unspecified: Secondary | ICD-10-CM

## 2024-10-31 MED ORDER — HYDROCODONE-ACETAMINOPHEN 5-325 MG PO TABS: 1.0000 | ORAL_TABLET | Freq: Four times a day (QID) | ORAL | 0 refills | Status: AC | PRN

## 2024-11-01 ENCOUNTER — Ambulatory Visit: Admitting: Nurse Practitioner

## 2024-11-08 ENCOUNTER — Inpatient Hospital Stay
Admission: RE | Admit: 2024-11-08 | Discharge: 2024-11-08 | Attending: Nurse Practitioner | Admitting: Nurse Practitioner

## 2024-11-08 DIAGNOSIS — M549 Dorsalgia, unspecified: Secondary | ICD-10-CM

## 2024-11-08 MED ORDER — GADOPICLENOL 0.5 MMOL/ML IV SOLN
10.0000 mL | Freq: Once | INTRAVENOUS | Status: AC | PRN
  Administered 2024-11-08: 09:00:00 9 mL via INTRAVENOUS

## 2024-11-16 ENCOUNTER — Ambulatory Visit: Payer: Self-pay | Admitting: Nurse Practitioner

## 2024-12-07 ENCOUNTER — Ambulatory Visit: Admitting: Nurse Practitioner

## 2024-12-07 ENCOUNTER — Telehealth: Payer: Self-pay | Admitting: Nurse Practitioner

## 2024-12-07 VITALS — BP 134/82 | HR 74 | Temp 98.5°F | Ht 68.0 in | Wt 192.1 lb

## 2024-12-07 DIAGNOSIS — M549 Dorsalgia, unspecified: Secondary | ICD-10-CM | POA: Diagnosis not present

## 2024-12-07 DIAGNOSIS — E6609 Other obesity due to excess calories: Secondary | ICD-10-CM

## 2024-12-07 DIAGNOSIS — Z6830 Body mass index (BMI) 30.0-30.9, adult: Secondary | ICD-10-CM

## 2024-12-07 DIAGNOSIS — E66811 Obesity, class 1: Secondary | ICD-10-CM

## 2024-12-07 DIAGNOSIS — G8929 Other chronic pain: Secondary | ICD-10-CM | POA: Diagnosis not present

## 2024-12-07 MED ORDER — WEGOVY 0.25 MG/0.5ML ~~LOC~~ SOAJ: 0.2500 mg | SUBCUTANEOUS | 0 refills | Status: AC

## 2024-12-07 NOTE — Telephone Encounter (Signed)
 Please initiate prior auth for Wegovy  for treatment of obesity. Patient was on wegovy  in the past and tolerated this well. Starting weight from 08/2023 was 240# with BMI of 36.49. Current weight 192# with BMI of 29.21 with comorbid generalized anxiety disorder, prediabetes, and hyperlipidemia.

## 2024-12-07 NOTE — Assessment & Plan Note (Signed)
 Chronic back pain Physical therapy ineffective. Seeking second opinion. - Referred to different spine specialist for second opinion. - Consider continued work with PT in the meantime, referral has been placed by neurosurgeon already

## 2024-12-07 NOTE — Progress Notes (Addendum)
 "  Established Patient Office Visit  Subjective   Patient ID: Krystal Humphrey, female    DOB: 07-06-1986  Age: 38 y.o. MRN: [id number]  Chief Complaint  Patient presents with   Back Pain    Discussed the use of AI scribe software for clinical note transcription with the patient, who gave verbal consent to proceed.  History of Present Illness Krystal Humphrey is a 39 year old female with chronic back pain who presents for evaluation of persistent symptoms.  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   Obesity - Starting BMI 36.49, starting weight 240 pounds in 08/2023 - Treated with Wegovy  and tolerated this well. Discontinued due to lack of insurance coverage. Patient reports insurance is going to cover Wegovy  again and would like to restart the medication.  - Has comorbid generalized anxiety disorder, prediabetes, and hyperlipidemia.  - She is focusing on lifestyle modifications. This includes diet with goal of a calorie deficit and physical activity as tolerated due to back pain being quite severe.       ROS: see HPI    Objective:     BP 134/82   Pulse 74   Temp 98.5 F (36.9 C) (Temporal)   Ht 5' 8 (1.727 m)   Wt 192 lb 2 oz (87.1 kg)   SpO2 98%   BMI 29.21 kg/m    Physical Exam Vitals reviewed.  Constitutional:      General: She is not in acute  distress.    Appearance: Normal appearance.  HENT:     Head: Normocephalic and atraumatic.  Cardiovascular:     Rate and Rhythm: Normal rate and regular rhythm.     Pulses: Normal pulses.     Heart sounds: Normal heart sounds.  Pulmonary:     Effort: Pulmonary effort is normal.     Breath sounds: Normal breath sounds.  Skin:    General: Skin is warm and dry.  Neurological:     General: No focal deficit present.     Mental Status: She is alert and oriented to person, place, and time.  Psychiatric:        Mood and Affect: Mood normal.        Behavior: Behavior normal.        Judgment: Judgment normal.      No results found for any visits on 12/07/24.    The ASCVD Risk score (Arnett DK, et al., 2019) failed to calculate for the following reasons:   The 2019 ASCVD risk score is only valid for ages 8 to 74    Assessment & Plan:   Problem List Items Addressed This Visit       Other   Obesity (Chronic)    Class 1 obesity Insurance coverage for Wegovy  reinstated. Interested in resuming Wegovy  for weight management. - Starting BMI in 08/2023 36.49, starting weight 240#. Current weight 192# and current BMI 29.21 - Prescribed Wegovy  0.25 mg weekly. - Patient to continue  with lifestyle modifications aiming at a calorie deficit and physical activity as tolerated with ultimate goal of 150 minutes/week. - Follow up in six weeks to assess progress and insurance approval.      Relevant Medications   semaglutide -weight management (WEGOVY ) 0.25 MG/0.5ML SOAJ SQ injection   Chronic back pain - Primary   Chronic back pain Physical therapy ineffective. Seeking second opinion. - Referred to different spine specialist for second opinion. - Consider continued work with PT in the meantime, referral has been placed by neurosurgeon already       Assessment and Plan Assessment & Plan Chronic back pain Physical therapy ineffective. Seeking second opinion. - Referred to different spine  specialist for second opinion. - Consider continued work with PT in the meantime, referral has been placed by neurosurgeon already  Class 1 obesity Insurance coverage for Wegovy  reinstated. Interested in resuming Wegovy  for weight management. - Starting BMI in 08/2023 36.49, starting weight 240#. Current weight 192# and current BMI 29.21 - Prescribed Wegovy  0.25 mg weekly. - Follow up in six weeks to assess progress and insurance approval.    Return in about 6 weeks (around 01/18/2025) for F/U with Krystal.    Krystal FORBES Pereyra, NP  "

## 2024-12-07 NOTE — Assessment & Plan Note (Addendum)
" °  Class 1 obesity Insurance coverage for Wegovy  reinstated. Interested in resuming Wegovy  for weight management. - Starting BMI in 08/2023 36.49, starting weight 240#. Current weight 192# and current BMI 29.21 - Prescribed Wegovy  0.25 mg weekly. - Patient to continue with lifestyle modifications aiming at a calorie deficit and physical activity as tolerated with ultimate goal of 150 minutes/week. - Follow up in six weeks to assess progress and insurance approval. "

## 2024-12-14 ENCOUNTER — Encounter: Payer: Self-pay | Admitting: Nurse Practitioner

## 2024-12-17 ENCOUNTER — Telehealth: Payer: Self-pay

## 2024-12-17 ENCOUNTER — Other Ambulatory Visit (HOSPITAL_COMMUNITY): Payer: Self-pay

## 2024-12-17 NOTE — Telephone Encounter (Signed)
 Pharmacy Patient Advocate Encounter   Received notification from [employer] that prior authorization for Wegovy  0.25MG /0.5ML auto-injectors is required/requested.   Insurance verification completed.   The patient is insured through [facility] MEDICAID.   Per test claim: PA required; PA submitted to above mentioned insurance via Latent Key/confirmation #/EOC [facility] Status is pending

## 2024-12-18 NOTE — Telephone Encounter (Signed)
 Pharmacy Patient Advocate Encounter  Received notification from [facility] MEDICAID that Prior Authorization for Wegovy  0.25mg /0.38ml has been DENIED.  Full denial letter will be uploaded to the media tab. See denial reason below.   PA #/Case ID/Reference #: [id number]    Pharmacy Patient Advocate Encounter  Insurance companies are becoming increasingly stricter about requiring thorough documentation of lifestyle modifications in the patient's chart at each visit. This includes detailed records of diet recommendations (caloric intake, etc), exercise plans (amount of time/wk, etc), and an emphasis on the patient's commitment to continuing these efforts while on medication.  Without this additional documentation in the chart notes, a prior authorization will most likely be denied.

## 2024-12-20 ENCOUNTER — Ambulatory Visit: Admitting: Nurse Practitioner

## 2024-12-20 ENCOUNTER — Other Ambulatory Visit: Payer: Self-pay | Admitting: Nurse Practitioner

## 2024-12-20 DIAGNOSIS — M549 Dorsalgia, unspecified: Secondary | ICD-10-CM

## 2024-12-20 MED ORDER — HYDROCODONE-ACETAMINOPHEN 5-325 MG PO TABS: 1.0000 | ORAL_TABLET | Freq: Three times a day (TID) | ORAL | 0 refills | Status: AC | PRN

## 2024-12-26 ENCOUNTER — Telehealth: Payer: Self-pay | Admitting: Pharmacist

## 2024-12-26 ENCOUNTER — Other Ambulatory Visit (HOSPITAL_COMMUNITY): Payer: Self-pay

## 2024-12-26 NOTE — Telephone Encounter (Signed)
 Appeal has been submitted. Will advise when response is received, please be advised that most companies may take 30 days to make a decision. Appeal letter and supporting documentation have been faxed to [phone] on 12/26/2024 @2 :09 pm.  Thank you, Devere Pandy, PharmD Clinical Pharmacist  [place]  Direct Dial: [phone]

## 2024-12-27 ENCOUNTER — Ambulatory Visit (HOSPITAL_BASED_OUTPATIENT_CLINIC_OR_DEPARTMENT_OTHER): Admitting: Physical Therapy

## 2024-12-27 NOTE — Therapy (Incomplete)
 " OUTPATIENT PHYSICAL THERAPY THORACOLUMBAR EVALUATION   Patient Name: Krystal Humphrey MRN: [id number] DOB:[birth year], 39 y.o., female Today's Date: 12/27/2024  END OF SESSION:   Past Medical History:  Diagnosis Date   Anxiety    Closed fracture of distal phalanx of finger 08/25/2021   Depression    GERD (gastroesophageal reflux disease)    Gestational diabetes    Gestational diabetes mellitus (GDM), antepartum 02/02/2023   Hiatal hernia    History of Clostridium difficile colitis    04/ 2011   History of duodenal ulcer    2009   History of esophagitis    History of panic attacks    Nephrolithiasis    bilateral per ct 07-27-2016  L > R  (right side nonobstructive)   Urgency of urination    Past Surgical History:  Procedure Laterality Date   CYSTOSCOPY WITH RETROGRADE PYELOGRAM, URETEROSCOPY AND STENT PLACEMENT Left 07/30/2016   Procedure: CYSTOSCOPY WITH LEFT  RETROGRADE PYELOGRAM AND LEFT STENT PLACEMENT;  Surgeon: Belvie LITTIE Clara, MD;  Location: [facility];  Service: Urology;  Laterality: Left;   CYSTOSCOPY WITH RETROGRADE PYELOGRAM, URETEROSCOPY AND STENT PLACEMENT Left 08/17/2016   Procedure: CYSTOSCOPY WITH LEFT RETROGRADE  STENT EXCHANGE, URETEROSCOPY AND STONE EXTRACTION, LASER LITHOTRIPSY AND LEFT RETROGRADE PYELOGRAM;  Surgeon: Norleen Seltzer, MD;  Location: [facility];  Service: Urology;  Laterality: Left;   ESOPHAGOGASTRODUODENOSCOPY  last one 03-26-2009   KNEE ARTHROSCOPY Right 03/19/2010   TONSILLECTOMY  child   Patient Active Problem List   Diagnosis Date Noted   Chronic back pain 10/11/2024   Mixed hyperlipidemia 08/30/2024   Vitamin D  deficiency 08/30/2024   Prediabetes 08/30/2024   Headache 08/30/2024   Needs flu shot 08/30/2024   Stiffness of cervical spine 08/06/2024   Acute low back pain 07/25/2024   Pain of cervical spine 07/25/2024   Hypoglycemia 09/01/2023   Fatigue 09/01/2023   Healthcare maintenance 09/01/2023   Tuberculosis  screening 08/12/2023   Immunity status testing 08/12/2023   Elevated liver enzymes 08/12/2023   Need for vaccination 08/12/2023   Rhinitis 05/19/2023   Encounter for general adult medical examination with abnormal findings 05/19/2023   Gallstone 11/15/2022   Cold intolerance 09/03/2022   Generalized anxiety disorder 06/20/2019   Obesity 01/07/2015    PCP: Elnor Lauraine BRAVO, NP   REFERRING PROVIDER: Louis Shove, MD  REFERRING DIAG: S29.019A (ICD-10-CM) - Strain of muscle and tendon of unspecified wall of thorax, initial encounter  Rationale for Evaluation and Treatment: Rehabilitation  THERAPY DIAG:  No diagnosis found.  ONSET DATE: ***  SUBJECTIVE:  SUBJECTIVE STATEMENT: ***  PERTINENT HISTORY:  generalized anxiety disorder, prediabetes, and hyperlipidemia  PAIN:  Are you having pain? {OPRCPAIN:27236}  PRECAUTIONS: None  WEIGHT BEARING RESTRICTIONS: No  FALLS:  Has patient fallen in last 6 months? {fallsyesno:27318}  OCCUPATION: ***  PLOF: {PLOF:24004}  PATIENT GOALS: ***  OBJECTIVE: (objective measures from initial evaluation unless otherwise dated)  DIAGNOSTIC FINDINGS:  MRI lumbar 11/08/24 IMPRESSION: 1. Essentially normal for age MRI appearance of the lumbar spine. Minimal disc degeneration, and mild facet hypertrophy with no spinal stenosis or neural impingement. MRI thoracic 11/08/24 IMPRESSION: 1. No acute or inflammatory process identified in the thoracic spine. No thoracic spinal stenosis. Normal thoracic spinal cord. 2. Mild degenerative facet hypertrophy T4-T5 through T9-T10 resulting in intermittent mild thoracic neural foraminal stenosis. Mild lower thoracic disc and endplate degeneration.  PATIENT SURVEYS:  {rehab surveys:24030}  COGNITION: Overall cognitive  status: {cognition:24006}     SENSATION: {sensation:27233}  POSTURE: {posture:25561}  PALPATION: ***  LUMBAR ROM:   AROM eval  Flexion   Extension   Right lateral flexion   Left lateral flexion   Right rotation   Left rotation    (Blank rows = not tested) * = pain/symptoms  LOWER EXTREMITY ROM:     Active  Right eval Left eval  Hip flexion    Hip extension    Hip abduction    Hip adduction    Hip internal rotation    Hip external rotation    Knee flexion    Knee extension    Ankle dorsiflexion    Ankle plantarflexion    Ankle inversion    Ankle eversion     (Blank rows = not tested) * = pain/symptoms  LOWER EXTREMITY MMT:    MMT Right eval Left eval  Hip flexion    Hip extension    Hip abduction    Hip adduction    Hip internal rotation    Hip external rotation    Knee flexion    Knee extension    Ankle dorsiflexion    Ankle plantarflexion    Ankle inversion    Ankle eversion     (Blank rows = not tested) * = pain/symptoms  LUMBAR SPECIAL TESTS:  {lumbar special test:25242}  FUNCTIONAL TESTS:  {Functional tests:24029}  GAIT: Distance walked: *** Assistive device utilized: {Assistive devices:23999} Level of assistance: {Levels of assistance:24026} Comments: ***  TODAY'S TREATMENT:                                                                                                                              DATE:  12/27/24  ***   PATIENT EDUCATION:  Education details: Patient educated on exam findings, POC, scope of PT, HEP, relevant anatomy and biomechanics, and ***. Person educated: Patient Education method: Explanation, Demonstration, and Handouts Education comprehension: verbalized understanding, returned demonstration, verbal cues required, and tactile cues required  HOME EXERCISE PROGRAM: ***  ASSESSMENT:  CLINICAL IMPRESSION: Patient a 39 y.o. y.o.  female who was seen today for physical therapy evaluation and treatment for back  pain. Patient presents with pain limited deficits in lumbar/thoracic spine strength, ROM, endurance, activity tolerance, and functional mobility with ADL. Patient is having to modify and restrict ADL as indicated by outcome measure score as well as subjective information and objective measures which is affecting overall participation. Patient will benefit from skilled physical therapy in order to improve function and reduce impairment.  OBJECTIVE IMPAIRMENTS: Abnormal gait, decreased activity tolerance, decreased balance, decreased endurance, decreased mobility, difficulty walking, decreased ROM, decreased strength, hypomobility, increased muscle spasms, impaired flexibility, improper body mechanics, postural dysfunction, and pain  ACTIVITY LIMITATIONS: carrying, lifting, bending, standing, squatting, stairs, transfers, bed mobility, reach over head, locomotion level, and caring for others  PARTICIPATION LIMITATIONS:  meal prep, cleaning, laundry, shopping, community activity, occupation, and yard work  PERSONAL FACTORS: 3+ comorbidities: generalized anxiety disorder, prediabetes, and hyperlipidemia are also affecting patient's functional outcome.   REHAB POTENTIAL: {rehabpotential:25112}  CLINICAL DECISION MAKING: {clinical decision making:25114}  EVALUATION COMPLEXITY: {Evaluation complexity:25115}   GOALS: Goals reviewed with patient? Yes  SHORT TERM GOALS: Target date: ***  Patient will be independent with HEP in order to improve functional outcomes. Baseline:  Goal status: INITIAL  2.  Patient will report at least 25% improvement in symptoms for improved quality of life. Baseline:  Goal status: INITIAL  3.  *** Baseline: *** Goal status: {GOALSTATUS:25110}  4.  *** Baseline: *** Goal status: {GOALSTATUS:25110}  5.  *** Baseline: *** Goal status: {GOALSTATUS:25110}  6.  *** Baseline: *** Goal status: {GOALSTATUS:25110}  LONG TERM GOALS: Target date: ***  Patient  will report at least 75% improvement in symptoms for improved quality of life. Baseline:  Goal status: {GOALSTATUS:25110}  2.  Patient will improve FOTO score by at least *** points in order to indicate improved tolerance to activity. Baseline: *** Goal status: {GOALSTATUS:25110}  3.  Patient will demonstrate at least 25% improvement in lumbar ROM in all restricted planes for improved ability to move trunk while completing ***. Baseline: *** Goal status: {GOALSTATUS:25110}  4.  Patient will be able to ambulate at least *** feet in in order to demonstrate improved tolerance to activity. Baseline: *** Goal status: {GOALSTATUS:25110}  5.  Patient will demonstrate grade of 5/5 MMT grade in all tested musculature as evidence of improved strength to assist with stair ambulation and gait.   Baseline: *** Goal status: {GOALSTATUS:25110}  6.  *** Baseline: *** Goal status: {GOALSTATUS:25110}   PLAN:  PT FREQUENCY: {rehab frequency:25116}  PT DURATION: {rehab duration:25117}  PLANNED INTERVENTIONS: 97164- PT Re-evaluation, 97110-Therapeutic exercises, 97530- Therapeutic activity, [id number]- Neuromuscular re-education, 97535- Self Care, [id number]- Manual therapy, [id number]- Gait training, [phone]- Orthotic Fit/training, [phone]- Canalith repositioning, [id number]- Aquatic Therapy, [phone]- Splinting, [phone]- Wound care (first 20 sq cm), 97598- Wound care (each additional 20 sq cm)Patient/Family education, Balance training, Stair training, Taping, Dry Needling, Joint mobilization, Joint manipulation, Spinal manipulation, Spinal mobilization, Scar mobilization, and DME instructions.  PLAN FOR NEXT SESSION: ***   Prentice RAMAN Guerino Caporale, PT, DPT 12/27/2024, 8:08 AM   For all possible CPT codes, reference the Planned Interventions line above.     Check all conditions that are expected to impact treatment: {Conditions expected to impact treatment:{Conditions expected to impact treatment:28273}   If treatment  provided at initial evaluation, no treatment charged due to lack of authorization.      "

## 2025-01-01 ENCOUNTER — Other Ambulatory Visit (HOSPITAL_COMMUNITY): Payer: Self-pay

## 2025-01-01 NOTE — Telephone Encounter (Signed)
 Based on a test claim through [facility], the insurance is now covering [employer] .  Patient filled on 12/27/2024, next fill due on 01/16/2025.

## 2025-01-03 ENCOUNTER — Ambulatory Visit (HOSPITAL_BASED_OUTPATIENT_CLINIC_OR_DEPARTMENT_OTHER): Admitting: Physical Therapy

## 2025-01-10 ENCOUNTER — Ambulatory Visit (HOSPITAL_BASED_OUTPATIENT_CLINIC_OR_DEPARTMENT_OTHER): Admitting: Physical Therapy

## 2025-01-17 ENCOUNTER — Encounter (HOSPITAL_BASED_OUTPATIENT_CLINIC_OR_DEPARTMENT_OTHER): Admitting: Physical Therapy

## 2025-01-24 ENCOUNTER — Ambulatory Visit: Admitting: Nurse Practitioner
